# Patient Record
Sex: Female | Born: 1950
Health system: Southern US, Community
[De-identification: ages and names within clinical notes are randomized; demographics above are authoritative.]

## PROBLEM LIST (undated history)

## (undated) DIAGNOSIS — C3490 Malignant neoplasm of unspecified part of unspecified bronchus or lung: Secondary | ICD-10-CM

## (undated) DIAGNOSIS — E119 Type 2 diabetes mellitus without complications: Secondary | ICD-10-CM

## (undated) DIAGNOSIS — E785 Hyperlipidemia, unspecified: Secondary | ICD-10-CM

## (undated) DIAGNOSIS — Z460 Encounter for fitting and adjustment of spectacles and contact lenses: Secondary | ICD-10-CM

## (undated) DIAGNOSIS — R5383 Other fatigue: Secondary | ICD-10-CM

## (undated) DIAGNOSIS — M545 Low back pain, unspecified: Secondary | ICD-10-CM

## (undated) DIAGNOSIS — R5381 Other malaise: Secondary | ICD-10-CM

## (undated) DIAGNOSIS — G4730 Sleep apnea, unspecified: Secondary | ICD-10-CM

## (undated) DIAGNOSIS — I10 Essential (primary) hypertension: Secondary | ICD-10-CM

## (undated) DIAGNOSIS — Z9221 Personal history of antineoplastic chemotherapy: Secondary | ICD-10-CM

## (undated) DIAGNOSIS — N189 Chronic kidney disease, unspecified: Secondary | ICD-10-CM

## (undated) DIAGNOSIS — M1990 Unspecified osteoarthritis, unspecified site: Secondary | ICD-10-CM

## (undated) DIAGNOSIS — C801 Malignant (primary) neoplasm, unspecified: Secondary | ICD-10-CM

## (undated) DIAGNOSIS — E559 Vitamin D deficiency, unspecified: Secondary | ICD-10-CM

## (undated) DIAGNOSIS — R011 Cardiac murmur, unspecified: Secondary | ICD-10-CM

## (undated) DIAGNOSIS — J449 Chronic obstructive pulmonary disease, unspecified: Secondary | ICD-10-CM

## (undated) DIAGNOSIS — E7800 Pure hypercholesterolemia, unspecified: Secondary | ICD-10-CM

## (undated) DIAGNOSIS — Z972 Presence of dental prosthetic device (complete) (partial): Secondary | ICD-10-CM

## (undated) HISTORY — DX: Other fatigue: R53.83

## (undated) HISTORY — DX: Low back pain: M54.5

## (undated) HISTORY — DX: Malignant neoplasm of unspecified part of unspecified bronchus or lung: C34.90

## (undated) HISTORY — DX: Low back pain, unspecified: M54.50

## (undated) HISTORY — DX: Vitamin D deficiency, unspecified: E55.9

## (undated) HISTORY — DX: Chronic kidney disease, unspecified: N18.9

## (undated) HISTORY — PX: MOUTH SURGERY: SHX715

## (undated) HISTORY — DX: Chronic obstructive pulmonary disease, unspecified: J44.9

## (undated) HISTORY — DX: Cardiac murmur, unspecified: R01.1

## (undated) HISTORY — PX: TONSILLECTOMY: SUR1361

## (undated) HISTORY — PX: CATARACT EXTRACTION: SUR2

## (undated) HISTORY — PX: OTHER SURGICAL HISTORY: SHX169

## (undated) HISTORY — DX: Pure hypercholesterolemia, unspecified: E78.00

## (undated) HISTORY — DX: Other malaise: R53.81

---

## 1997-10-30 ENCOUNTER — Other Ambulatory Visit: Admission: RE | Admit: 1997-10-30 | Discharge: 1997-10-30 | Payer: Self-pay | Admitting: Obstetrics and Gynecology

## 1998-12-13 ENCOUNTER — Other Ambulatory Visit: Admission: RE | Admit: 1998-12-13 | Discharge: 1998-12-13 | Payer: Self-pay | Admitting: Obstetrics and Gynecology

## 1999-02-12 ENCOUNTER — Encounter: Payer: Self-pay | Admitting: Obstetrics and Gynecology

## 1999-02-12 ENCOUNTER — Ambulatory Visit (HOSPITAL_COMMUNITY): Admission: RE | Admit: 1999-02-12 | Discharge: 1999-02-12 | Payer: Self-pay | Admitting: Obstetrics and Gynecology

## 2000-02-17 ENCOUNTER — Other Ambulatory Visit: Admission: RE | Admit: 2000-02-17 | Discharge: 2000-02-17 | Payer: Self-pay | Admitting: Obstetrics and Gynecology

## 2001-01-15 ENCOUNTER — Ambulatory Visit (HOSPITAL_COMMUNITY): Admission: RE | Admit: 2001-01-15 | Discharge: 2001-01-15 | Payer: Self-pay | Admitting: Obstetrics and Gynecology

## 2001-01-15 ENCOUNTER — Encounter: Payer: Self-pay | Admitting: Obstetrics and Gynecology

## 2001-03-10 ENCOUNTER — Other Ambulatory Visit: Admission: RE | Admit: 2001-03-10 | Discharge: 2001-03-10 | Payer: Self-pay | Admitting: Obstetrics and Gynecology

## 2002-07-18 ENCOUNTER — Other Ambulatory Visit: Admission: RE | Admit: 2002-07-18 | Discharge: 2002-07-18 | Payer: Self-pay | Admitting: Obstetrics and Gynecology

## 2002-09-28 ENCOUNTER — Encounter: Payer: Self-pay | Admitting: Obstetrics and Gynecology

## 2002-09-28 ENCOUNTER — Ambulatory Visit (HOSPITAL_COMMUNITY): Admission: RE | Admit: 2002-09-28 | Discharge: 2002-09-28 | Payer: Self-pay | Admitting: Obstetrics and Gynecology

## 2003-05-25 ENCOUNTER — Other Ambulatory Visit: Admission: RE | Admit: 2003-05-25 | Discharge: 2003-05-25 | Payer: Self-pay | Admitting: Obstetrics and Gynecology

## 2004-04-25 ENCOUNTER — Other Ambulatory Visit: Admission: RE | Admit: 2004-04-25 | Discharge: 2004-04-25 | Payer: Self-pay | Admitting: Obstetrics and Gynecology

## 2004-05-02 ENCOUNTER — Ambulatory Visit (HOSPITAL_COMMUNITY): Admission: RE | Admit: 2004-05-02 | Discharge: 2004-05-02 | Payer: Self-pay | Admitting: Obstetrics and Gynecology

## 2006-06-01 ENCOUNTER — Encounter: Admission: RE | Admit: 2006-06-01 | Discharge: 2006-06-01 | Payer: Self-pay | Admitting: Obstetrics and Gynecology

## 2007-07-08 ENCOUNTER — Ambulatory Visit (HOSPITAL_COMMUNITY): Admission: RE | Admit: 2007-07-08 | Discharge: 2007-07-08 | Payer: Self-pay | Admitting: Obstetrics and Gynecology

## 2008-12-13 ENCOUNTER — Ambulatory Visit (HOSPITAL_COMMUNITY): Admission: RE | Admit: 2008-12-13 | Discharge: 2008-12-13 | Payer: Self-pay | Admitting: Obstetrics and Gynecology

## 2009-01-23 ENCOUNTER — Emergency Department (HOSPITAL_COMMUNITY): Admission: EM | Admit: 2009-01-23 | Discharge: 2009-01-23 | Payer: Self-pay | Admitting: Emergency Medicine

## 2009-04-30 ENCOUNTER — Encounter: Admission: RE | Admit: 2009-04-30 | Discharge: 2009-04-30 | Payer: Self-pay | Admitting: Family Medicine

## 2009-05-22 ENCOUNTER — Encounter: Admission: RE | Admit: 2009-05-22 | Discharge: 2009-05-22 | Payer: Self-pay | Admitting: Neurosurgery

## 2009-06-05 ENCOUNTER — Encounter: Admission: RE | Admit: 2009-06-05 | Discharge: 2009-06-05 | Payer: Self-pay | Admitting: Neurosurgery

## 2010-04-03 ENCOUNTER — Ambulatory Visit (HOSPITAL_COMMUNITY): Admission: RE | Admit: 2010-04-03 | Discharge: 2010-04-03 | Payer: Self-pay | Admitting: Obstetrics and Gynecology

## 2010-07-21 HISTORY — PX: MASTECTOMY: SHX3

## 2011-04-24 ENCOUNTER — Other Ambulatory Visit (HOSPITAL_COMMUNITY): Payer: Self-pay | Admitting: Obstetrics and Gynecology

## 2011-04-24 DIAGNOSIS — Z1231 Encounter for screening mammogram for malignant neoplasm of breast: Secondary | ICD-10-CM

## 2011-05-02 ENCOUNTER — Ambulatory Visit (HOSPITAL_COMMUNITY)
Admission: RE | Admit: 2011-05-02 | Discharge: 2011-05-02 | Disposition: A | Discharge status: Home / Self Care | Payer: BC Managed Care – PPO | Source: Ambulatory Visit | Attending: Obstetrics and Gynecology | Admitting: Obstetrics and Gynecology

## 2011-05-02 DIAGNOSIS — Z1231 Encounter for screening mammogram for malignant neoplasm of breast: Secondary | ICD-10-CM | POA: Insufficient documentation

## 2011-05-07 ENCOUNTER — Other Ambulatory Visit: Payer: Self-pay | Admitting: Obstetrics and Gynecology

## 2011-05-07 DIAGNOSIS — R928 Other abnormal and inconclusive findings on diagnostic imaging of breast: Secondary | ICD-10-CM

## 2011-05-08 ENCOUNTER — Other Ambulatory Visit: Payer: Self-pay | Admitting: Obstetrics and Gynecology

## 2011-05-08 DIAGNOSIS — R928 Other abnormal and inconclusive findings on diagnostic imaging of breast: Secondary | ICD-10-CM

## 2011-05-21 ENCOUNTER — Ambulatory Visit
Admission: RE | Admit: 2011-05-21 | Discharge: 2011-05-21 | Disposition: A | Discharge status: Home / Self Care | Payer: BC Managed Care – PPO | Source: Ambulatory Visit | Attending: Obstetrics and Gynecology | Admitting: Obstetrics and Gynecology

## 2011-05-21 ENCOUNTER — Other Ambulatory Visit: Payer: Self-pay | Admitting: Obstetrics and Gynecology

## 2011-05-21 DIAGNOSIS — R928 Other abnormal and inconclusive findings on diagnostic imaging of breast: Secondary | ICD-10-CM

## 2011-05-23 ENCOUNTER — Ambulatory Visit
Admission: RE | Admit: 2011-05-23 | Discharge: 2011-05-23 | Disposition: A | Discharge status: Home / Self Care | Payer: BC Managed Care – PPO | Source: Ambulatory Visit | Attending: Obstetrics and Gynecology | Admitting: Obstetrics and Gynecology

## 2011-05-23 DIAGNOSIS — R928 Other abnormal and inconclusive findings on diagnostic imaging of breast: Secondary | ICD-10-CM

## 2011-05-26 ENCOUNTER — Other Ambulatory Visit: Payer: Self-pay | Admitting: Obstetrics and Gynecology

## 2011-05-26 DIAGNOSIS — C50912 Malignant neoplasm of unspecified site of left female breast: Secondary | ICD-10-CM

## 2011-05-26 HISTORY — PX: BREAST BIOPSY: SHX20

## 2011-05-30 ENCOUNTER — Ambulatory Visit
Admission: RE | Admit: 2011-05-30 | Discharge: 2011-05-30 | Disposition: A | Discharge status: Home / Self Care | Payer: BC Managed Care – PPO | Source: Ambulatory Visit | Attending: Obstetrics and Gynecology | Admitting: Obstetrics and Gynecology

## 2011-05-30 DIAGNOSIS — C50912 Malignant neoplasm of unspecified site of left female breast: Secondary | ICD-10-CM

## 2011-05-30 MED ORDER — GADOBENATE DIMEGLUMINE 529 MG/ML IV SOLN
15.0000 mL | Freq: Once | INTRAVENOUS | Status: AC | PRN
  Administered 2011-05-30: 15 mL via INTRAVENOUS

## 2011-06-03 ENCOUNTER — Other Ambulatory Visit: Payer: Self-pay | Admitting: *Deleted

## 2011-06-03 DIAGNOSIS — C50919 Malignant neoplasm of unspecified site of unspecified female breast: Secondary | ICD-10-CM

## 2011-06-04 ENCOUNTER — Ambulatory Visit: Payer: BC Managed Care – PPO | Admitting: Radiation Oncology

## 2011-06-11 ENCOUNTER — Ambulatory Visit: Payer: BC Managed Care – PPO

## 2011-06-11 ENCOUNTER — Ambulatory Visit (HOSPITAL_BASED_OUTPATIENT_CLINIC_OR_DEPARTMENT_OTHER): Payer: BC Managed Care – PPO | Admitting: General Surgery

## 2011-06-11 ENCOUNTER — Encounter (INDEPENDENT_AMBULATORY_CARE_PROVIDER_SITE_OTHER): Payer: Self-pay | Admitting: General Surgery

## 2011-06-11 ENCOUNTER — Ambulatory Visit: Payer: BC Managed Care – PPO | Attending: General Surgery | Admitting: Physical Therapy

## 2011-06-11 ENCOUNTER — Encounter: Payer: Self-pay | Admitting: Oncology

## 2011-06-11 ENCOUNTER — Ambulatory Visit (HOSPITAL_BASED_OUTPATIENT_CLINIC_OR_DEPARTMENT_OTHER): Payer: BC Managed Care – PPO | Admitting: Oncology

## 2011-06-11 ENCOUNTER — Ambulatory Visit
Admission: RE | Admit: 2011-06-11 | Discharge: 2011-06-11 | Disposition: A | Discharge status: Home / Self Care | Payer: BC Managed Care – PPO | Source: Ambulatory Visit | Attending: Radiation Oncology | Admitting: Radiation Oncology

## 2011-06-11 ENCOUNTER — Other Ambulatory Visit (HOSPITAL_BASED_OUTPATIENT_CLINIC_OR_DEPARTMENT_OTHER): Payer: BC Managed Care – PPO | Admitting: Lab

## 2011-06-11 VITALS — BP 121/78 | HR 77 | Temp 98.3°F | Ht 59.5 in | Wt 163.6 lb

## 2011-06-11 DIAGNOSIS — C50912 Malignant neoplasm of unspecified site of left female breast: Secondary | ICD-10-CM

## 2011-06-11 DIAGNOSIS — C50212 Malignant neoplasm of upper-inner quadrant of left female breast: Secondary | ICD-10-CM | POA: Diagnosis present

## 2011-06-11 DIAGNOSIS — IMO0001 Reserved for inherently not codable concepts without codable children: Secondary | ICD-10-CM | POA: Insufficient documentation

## 2011-06-11 DIAGNOSIS — C50919 Malignant neoplasm of unspecified site of unspecified female breast: Secondary | ICD-10-CM

## 2011-06-11 DIAGNOSIS — D0590 Unspecified type of carcinoma in situ of unspecified breast: Secondary | ICD-10-CM

## 2011-06-11 DIAGNOSIS — Z170 Estrogen receptor positive status [ER+]: Secondary | ICD-10-CM

## 2011-06-11 DIAGNOSIS — C50219 Malignant neoplasm of upper-inner quadrant of unspecified female breast: Secondary | ICD-10-CM

## 2011-06-11 LAB — CBC WITH DIFFERENTIAL/PLATELET
BASO%: 0.4 % (ref 0.0–2.0)
HCT: 39.3 % (ref 34.8–46.6)
LYMPH%: 28.7 % (ref 14.0–49.7)
MCH: 33 pg (ref 25.1–34.0)
MCHC: 33.7 g/dL (ref 31.5–36.0)
MCV: 97.8 fL (ref 79.5–101.0)
MONO#: 0.5 10*3/uL (ref 0.1–0.9)
MONO%: 10.4 % (ref 0.0–14.0)
NEUT%: 58.7 % (ref 38.4–76.8)
Platelets: 197 10*3/uL (ref 145–400)
RBC: 4.02 10*6/uL (ref 3.70–5.45)

## 2011-06-11 LAB — COMPREHENSIVE METABOLIC PANEL
ALT: 17 U/L (ref 0–35)
Alkaline Phosphatase: 64 U/L (ref 39–117)
CO2: 28 mEq/L (ref 19–32)
Creatinine, Ser: 0.78 mg/dL (ref 0.50–1.10)
Total Bilirubin: 0.4 mg/dL (ref 0.3–1.2)

## 2011-06-11 NOTE — Progress Notes (Signed)
Martha Pham is an 60 y.o. female.   Chief Complaint: left breast cancer  HPI: this patient is seen today in the multi-disciplinary breast clinic for evaluation of a newly diagnosed left breast cancer found on annual screening mammogram. She was noted to have some calcifications and was called back for biopsy which was consistent with invasive ductal carcinoma. She states that she does hurt self breast exams and has not noticed any suspicious masses or changes even after the biopsy was performed. She has a sister with a history of breast cancer at age 55 but no other family history of breast cancer. She denies any systemic symptoms such as headaches, bony pains, or weight loss. On followup MRI she was noted to have a 2.7 cm x 2.3 cm x 1.0 cm area of enhancement in the area of the known tumor.  No past medical history on file. HTN, HLD   No past surgical history on file.  Family History  Problem Relation Age of Onset  . Breast cancer Sister 69   Social History:  reports that she has never smoked. She does not have any smokeless tobacco history on file. She reports that she drinks alcohol. Her drug history not on file.  Allergies:  Allergies  Allergen Reactions  . Dust Mite Extract Itching  . Other     grass  All other review of systems negative or noncontributory except as stated in the HPI   No current outpatient prescriptions on file as of 06/11/2011.   No current facility-administered medications on file as of 06/11/2011.    Results for orders placed in visit on 06/11/11 (from the past 48 hour(s))  CBC WITH DIFFERENTIAL     Status: Normal   Collection Time   06/11/11  8:39 AM      Component Value Range Comment   WBC 4.7  3.9 - 10.3 (10e3/uL)    NEUT# 2.8  1.5 - 6.5 (10e3/uL)    HGB 13.3  11.6 - 15.9 (g/dL)    HCT 16.1  09.6 - 04.5 (%)    Platelets 197  145 - 400 (10e3/uL)    MCV 97.8  79.5 - 101.0 (fL)    MCH 33.0  25.1 - 34.0 (pg)    MCHC 33.7  31.5 - 36.0 (g/dL)    RBC 4.09  8.11 - 9.14 (10e6/uL)    RDW 12.1  11.2 - 14.5 (%)    lymph# 1.4  0.9 - 3.3 (10e3/uL)    MONO# 0.5  0.1 - 0.9 (10e3/uL)    Eosinophils Absolute 0.1  0.0 - 0.5 (10e3/uL)    Basophils Absolute 0.0  0.0 - 0.1 (10e3/uL)    NEUT% 58.7  38.4 - 76.8 (%)    LYMPH% 28.7  14.0 - 49.7 (%)    MONO% 10.4  0.0 - 14.0 (%)    EOS% 1.8  0.0 - 7.0 (%)    BASO% 0.4  0.0 - 2.0 (%)   COMPREHENSIVE METABOLIC PANEL     Status: Abnormal   Collection Time   06/11/11  8:39 AM      Component Value Range Comment   Sodium 141  135 - 145 (mEq/L)    Potassium 3.5  3.5 - 5.3 (mEq/L)    Chloride 103  96 - 112 (mEq/L)    CO2 28  19 - 32 (mEq/L)    Glucose, Bld 108 (*) 70 - 99 (mg/dL)    BUN 17  6 - 23 (mg/dL)    Creatinine, Ser  0.78  0.50 - 1.10 (mg/dL)    Total Bilirubin 0.4  0.3 - 1.2 (mg/dL)    Alkaline Phosphatase 64  39 - 117 (U/L)    AST 15  0 - 37 (U/L)    ALT 17  0 - 35 (U/L)    Total Protein 7.9  6.0 - 8.3 (g/dL)    Albumin 4.1  3.5 - 5.2 (g/dL)    Calcium 16.1  8.4 - 10.5 (mg/dL)    No results found.   There were no vitals taken for this visit. General appearance: alert, cooperative and no distress Head: Normocephalic, without obvious abnormality, atraumatic Eyes: conjunctivae/corneas clear. PERRL, EOM's intact. Fundi benign. Neck: no adenopathy and supple, symmetrical, trachea midline Resp: clear to auscultation bilaterally Chest wall: no tenderness Breasts: normal appearance, no masses or tenderness, on the right breast she has a scar from her recent mole removal on the upper inner portion of her breast but otherwise no suspicious masses skin changes or lymphadenopathy. On the left she has evidence of a prior biopsy site but no suspicious masses, skin changes, or lymphadenopathy is appreciated on this side as well. Cardio: regular rate and rhythm, S1, S2 normal, no murmur, click, rub or gallop GI: soft, non-tender; bowel sounds normal; no masses,  no organomegaly Extremities:  extremities normal, atraumatic, no cyanosis or edema Pulses: 2+ and symmetric Skin: Skin color, texture, turgor normal. No rashes or lesions Neurologic: Grossly normal  Assessment/Plan Left breast cancer  Prolonged discussion with the patient regarding the options for breast conservation versus mastectomy including the pros and cons and risks and benefits of each option. Regardless of the surgical option noting that she would benefit from a sentinel node biopsy and we will plan for this at the time of her surgery. I think that she is leaning towards breast conservation and given the size of her breast, I think that she would be a reasonable candidate for this. She is also triple negative breast cancer and will likely need chemotherapy and this was offered to her today by Dr. Donnie Coffin. Since she is leaning towards breast conservation we will plan for a left needle localized lumpectomy with sentinel node biopsy and right-sided MediPort placement. She was also offered neoadjuvant treatment by Dr. Donnie Coffin and she is planning on considering this and will let us know if she would like to proceed with neoadjuvant treatment or proceed with standard breast conservation. In the meantime, will set her up for her planned procedure and I did discuss with her the risks of infection, bleeding, pain, scarring, pneumothorax and hemothorax, need for open surgery and port malfunction and she expressed understanding and desires to proceed with MediPort placement we also discussed the risks of lumpectomy and sentinel node biopsy including the need for reexcision of margins and recurrence poor cosmesis and wound infection as well as the need for radiation therapy. She understands that she may need mastectomy potential is well if we are unable to obtain clear margins with breast conservation. At lease 45 minutes was spent counseling this patient.  Lodema Pilot DAVID 06/11/2011, 12:59 PM

## 2011-06-12 DIAGNOSIS — C50212 Malignant neoplasm of upper-inner quadrant of left female breast: Secondary | ICD-10-CM | POA: Diagnosis present

## 2011-06-12 NOTE — Progress Notes (Signed)
CC:   Martha Pham, M.D. Martha Pham. Martha Pham, M.D. Martha Pham, M.D., F.R.C.P.C. Martha Pilot, MD  DIAGNOSIS:  T2 N0 invasive ductal carcinoma of the left breast.  PREVIOUS INTERVENTIONS:  Left needle core biopsy on 05/23/2011 revealing triple-negative high-grade invasive ductal carcinoma, Ki-67 of 75%.  HISTORY OF PRESENT ILLNESS:  Martha Pham is a pleasant 60 year old female who presented with new calcifications seen on a screening mammogram. These were biopsied and found revealed a grade 3 invasive ductal carcinoma.  This malignancy was triple negative with high Ki-67.  MRI confirmed the presence of a 2.7 x 2.3 x 1.0 cm mass in the left breast. No abnormalities were seen in the right breast.  No abnormal lymph nodes were seen.  She was then referred to multidisciplinary clinic for treatment options of this malignancy.  In speaking with Martha Pham, she has been asymptomatic since her biopsy.  She reports no symptoms prior to her biopsy.  PAST MEDICAL HISTORY: 1. High blood pressure. 2. Back, neck and leg pain.  MEDICATIONS:  Micardis and Lipitor.  ALLERGIES:  Dust and grass.  FAMILY HISTORY:  Her sister had breast cancer at 37.  There is no other family history of malignancy.  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  She is widowed.  She is accompanied by her daughter whom she asked, after our conversation, to come into the room today. She denies any smoking history.  She drinks about a glass of wine per week.  GYN HISTORY:  First menses 10.  Last menstrual period in 1999.  She used hormone replacement for 3 years.  She is GX, P3.  REVIEW OF SYSTEMS:  Positive for night sweats, pain, muscle aching, blurry vision, sinusitis, dentures, irregular heartbeat, coughing, heartburn, abdominal pain, breast lump, abnormal moles, back pain, joint pain, arthritis, anxiety, depression, hot flashes and occasional steroid use.  PHYSICAL  EXAMINATION:  She is a pleasant female in no distress, sitting comfortably on the exam room table.  Vital signs:  Weight 163 pounds, height 4 feet 11 inches.  Blood pressure 121/78, pulse 77, respirations 20, temperature 98.3.  She is a pleasant female who appears her stated age sitting comfortably on a chair.  She has no palpable cervical or supraclavicular adenopathy.  No palpable axillary adenopathy bilaterally.  She has large pendulous breasts bilaterally.  She has a keloid over the inner portion of her right breast.  No palpable abnormalities there.  She has a small biopsy entry point in the upper inner quadrant of the right breast.  No palpable abnormalities there.  IMPRESSION:  T2 N0 invasive ductal carcinoma of the left breast.  RECOMMENDATIONS:  I spoke to Martha Pham regarding her diagnosis and options for treatment.  We discussed the equivalency in terms of survival between mastectomy and lumpectomy with radiation.  We discussed 6 weeks of treatment as an outpatient.  We discussed the process of simulation and the placement of tattoos.  We discussed the possible side effects including but not limited to skin darkening, which can be permanent or temporary, and damage to the heart and lungs.  I think she would benefit from being treated in the prone position.  I told her I would meet back with her after chemotherapy if she elected for breast conservation.  She will also be meeting with Dr. Donnie Coffin and Dr. Biagio Quint today regarding other options.  I spent 60 minutes with this patient, greater than 50% of that time was spent in  counseling and coordination of care.    ______________________________ Lurline Hare, M.D. SW/MEDQ  D:  06/11/2011  T:  06/12/2011  Job:  [id number]

## 2011-06-15 NOTE — Progress Notes (Signed)
Martha Pham Dob:01-Nov-2050 60 yo     Referral MD  CC: Dr Trude Mcburney         Dr Luciano Cutter         Dr . Ernestina Patches         Dr. Valentino Hue    Reason for Referral: Breast cancer   Chief Complaint  Patient presents with  . Breast Cancer found on routine screening   :   HPI: This is a pleasant 60 year old woman from [facility] referred for evaluation and discussion after being  diagnosed with breast cancer. She is seen today in the multidisciplinary clinic by doctors Leonie Douglas and myself.  she has been in good health. She she does undergo annual screening mammography. Mammogram performed 05/02/2011 showed calcifications in the left breast her mammogram left breast ultrasound performed 05/21/2011 showed developing mass left breast. A biopsy performed on 05/23/2011 showed a decreased ductal cancer, ER and PR 0, proliferative index 75% and HER-2 was nonamplified .MRI scan of both breasts was performed 05/30/2011 and this showed a lesion measuring 2.7 x 2.3 x 1 cm in the upper inner quadrant of the left breast . No other abnormalities were seen  :  No past surgical history on file.:  Current outpatient prescriptions:atorvastatin (LIPITOR) 10 MG tablet, Take 10 mg by mouth daily.  , Disp: , Rfl: ;  PRESCRIPTION MEDICATION, Take by mouth daily. Mercardis , Disp: , Rfl: :    :  Allergies  Allergen Reactions  . Dust Mite Extract Itching  . Other     grass  :  Family History  Problem Relation Age of Onset  . Breast cancer Sister 28  :total of 9 sibs- 2 brothers, 7 sisters. No other history of breast or ovarian cancer  History   Social History  . Marital Status: Widowed    Spouse Name: N/A    Number of Children: 1 Latoya m [place], 32 , [place], [place]  . Years of Education: N/A   Occupational History  . Not on file.   Social History Main Topics  . Smoking status: Never Smoker   . Smokeless tobacco: Not on file  . Alcohol Use: Yes, 1 glass of wine per  week  . Drug Use:   . Sexually Active:    Other Topics Concern  . 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  : Reproductive History: G3P3; 2 children have passed away; crib death and consequences of hypertension at age 44.  Menopause 1999- HRT x 3 yrs., BCP x 9 yrs.   Exam  HEENT:  Sclerae anicteric, conjunctivae pink.  Oropharynx clear.  No mucositis or candidiasis.  Nodes:  No cervical, supraclavicular, or axillary lymphadenopathy palpated.  Breast Exam:  Right breast is benign.  No masses, discharge, skin change, or nipple inversion.  Left breast is benign.  No masses, discharge, skin change, or nipple inversion., slight ecchymoses detected. Lungs:  Clear to auscultation bilaterally.  No crackles, rhonchi, or wheezes.  Heart:  Regular rate and rhythm.  Abdomen:  Soft, nontender.  Positive bowel sounds.  No organomegaly or masses palpated.  Musculoskeletal:  No focal spinal tenderness to palpation.  Extremities:  Benign.  No peripheral edema or cyanosis.  Skin:  Benign.  Neuro:  Nonfocal.     Blood smear review: n/a  Pathology:as above  Mr Breast Bilateral W Wo Contrast  05/30/2011  *RADIOLOGY REPORT*  Clinical Data: Recent diagnosis of ductal carcinoma in situ of the left breast.  BUN and creatinine were obtained on site at [facility] at [address] Results:  BUN 9 mg/dL,  Creatinine 0.8 mg/dL.  BILATERAL BREAST MRI WITH AND WITHOUT CONTRAST  Technique: Multiplanar, multisequence MR images of both breasts were obtained prior to and following the intravenous administration of 15ml of Multihance.  Three dimensional images were evaluated at the independent DynaCad workstation.  Comparison:  Bilateral mammogram 05/02/2011 and post-biopsy clip mammogram 05/23/2011  Findings:  LEFT BREAST: There is a mild background parenchymal enhancement pattern bilaterally.  Linearly oriented stippled enhancement in the upper  inner quadrant of the left breast, middle third, measures 2.7 x 2.3 x 1.0 cm (AP by craniocaudal by transverse dimensions). Associated biopsy tract extends inferiorly from the abnormal enhancement.  Biopsy clip artifact is satisfactorily positioned in the region of suspicious enhancement. The remainder of the left breast is negative.  RIGHT BREAST: No mass or suspicious enhancement is identified in the right breast to suggest malignancy.  Negative for axillary or internal mammary chain lymphadenopathy.  IMPRESSION:  1.  Solitary area of linear stippled enhancement upper inner quadrant of the left breast corresponds to the biopsy-proven ductal carcinoma in situ.  The remainder of the left breast is negative. 2.  No MRI evidence of malignancy in the right breast.  THREE-DIMENSIONAL MR IMAGE RENDERING ON INDEPENDENT WORKSTATION:  Three-dimensional MR images were rendered by post-processing of the original MR data on an independent workstation.  The three- dimensional MR images were interpreted, and findings were reported in the accompanying complete MRI report for this study.  BI-RADS CATEGORY 6:  Known biopsy-proven malignancy - appropriate action should be taken.  Original Report Authenticated By: Britta Mccreedy, M.D.   Mm Breast Stereo Biopsy Left  05/26/2011  **ADDENDUM** CREATED: 05/26/2011 12:34:57  The pathology associated with the left breast stereotactic core biopsy demonstrated invasive ductal carcinoma and DCIS which is felt to be high grade.  The pathology is concordant with imaging findings.  Dr. Jean Rosenthal discussed the findings with the patient by telephone and answered her questions.  The patient states her biopsy site is clean and dry without hematoma formation.  Post biopsy wound care instructions were reviewed with the patient.  The patient has been scheduled for the multidisciplinary clinic on 06/04/2011.  Breast MRI has been scheduled for 05/30/2011 at 09:15 a.m.  The patient was encouraged to call  the Breast Center for additional questions or concerns.  Addended by:  Rolla Plate, M.D. on 05/26/2011 12:34:57.  **END ADDENDUM** SIGNED BY: Rolla Plate, M.D.   05/26/2011  *RADIOLOGY REPORT*  Clinical Data:  Patient presents for stereotactic biopsy of a group of indeterminate microcalcifications with possible associated mass of the upper inner quadrant of the left breast.  STEREOTACTIC-GUIDED VACUUM ASSISTED BIOPSY OF THE LEFT BREAST AND SPECIMEN RADIOGRAPH  I met with the patient, and we discussed the procedure of stereotactic-guided biopsy, including risks, benefits, and alternatives.  Specifically, we discussed the risks of infection, bleeding, tissue injury, clip migration, and inadequate sampling. Informed, written consent was given.  Using sterile technique, 2% lidocaine, stereotactic guidance, and a 9 gauge vacuum assisted device, biopsy was performed of the targeted group of microcalcifications and possible associated mass. Specimen radiograph was performed, showing multiple specimens containing the targeted microcalcifications.  Specimens with calcifications are identified for pathology.  At the conclusion of the procedure, a T-shaped tissue marker clip was deployed into the biopsy cavity.  Follow-up 2-view mammogram confirmed clip placement satisfactorily at the biopsy site.  IMPRESSION: Stereotactic-guided biopsy of indeterminate left breast microcalcifications.  No apparent complications.  Original Report Authenticated By: Rolla Plate, M.D.   Mm Breast Surgical Specimen  05/26/2011  **ADDENDUM** CREATED: 05/26/2011 12:34:57  The pathology associated with the left breast stereotactic core biopsy demonstrated invasive ductal carcinoma and DCIS which is felt to be high grade.  The pathology is concordant with imaging findings.  Dr. Jean Rosenthal discussed the findings with the patient by telephone and answered her questions.  The patient states her biopsy site is clean and dry without  hematoma formation.  Post biopsy wound care instructions were reviewed with the patient.  The patient has been scheduled for the multidisciplinary clinic on 06/04/2011.  Breast MRI has been scheduled for 05/30/2011 at 09:15 a.m.  The patient was encouraged to call the Breast Center for additional questions or concerns.  Addended by:  Rolla Plate, M.D. on 05/26/2011 12:34:57.  **END ADDENDUM** SIGNED BY: Rolla Plate, M.D.   05/26/2011  *RADIOLOGY REPORT*  Clinical Data:  Patient presents for stereotactic biopsy of a group of indeterminate microcalcifications with possible associated mass of the upper inner quadrant of the left breast.  STEREOTACTIC-GUIDED VACUUM ASSISTED BIOPSY OF THE LEFT BREAST AND SPECIMEN RADIOGRAPH  I met with the patient, and we discussed the procedure of stereotactic-guided biopsy, including risks, benefits, and alternatives.  Specifically, we discussed the risks of infection, bleeding, tissue injury, clip migration, and inadequate sampling. Informed, written consent was given.  Using sterile technique, 2% lidocaine, stereotactic guidance, and a 9 gauge vacuum assisted device, biopsy was performed of the targeted group of microcalcifications and possible associated mass. Specimen radiograph was performed, showing multiple specimens containing the targeted microcalcifications.  Specimens with calcifications are identified for pathology.  At the conclusion of the procedure, a T-shaped tissue marker clip was deployed into the biopsy cavity.  Follow-up 2-view mammogram confirmed clip placement satisfactorily at the biopsy site.  IMPRESSION: Stereotactic-guided biopsy of indeterminate left breast microcalcifications.  No apparent complications.  Original Report Authenticated By: Rolla Plate, M.D.   Mm Digital Diag Ltd L  05/21/2011  *RADIOLOGY REPORT*  Clinical Data:  Abnormal screening, left breast  DIGITAL DIAGNOSTIC LEFT MAMMOGRAM WITHOUT CAD  Comparison:  Multiple priors   Findings:  Spot magnification views in the upper inner quadrant of the left breast, middle third confirm the presence of a cluster of calcifications and an associated low density irregular mass with ill defined margins.  The calcifications are coarse and linear.  No linear orientation is noted.  This may represent fat necrosis although malignancy cannot be excluded.   Therefore, a stereotactic biopsy is recommended.  IMPRESSION: Calcifications and developing mass, left breast which a stereotactic biopsy is scheduled at the patient's request for Friday, November 2 at 8 am.  BI-RADS CATEGORY 4:  Suspicious abnormality - biopsy should be considered.  Original Report Authenticated By: Hiram Gash, M.D.    Assessment and Plan: This is a pleasant 60 year old woman here with her family to discuss her recent diagnosis of breast cancer. She has a triple negative breast cancer that would lend itself well to get a down staging type chemotherapy . I have given her the option of treatment up front with chemotherapy knowing that after surgery she would likely require chemotherapy and adjuvant fashion. The 21st of be to help down stage her cancer as well as 2 of get a sense of the intrinsic sensitivity of the tumor.  I proposed giving her a total of 8 cycles of chemotherapy every 2 weeks via a Port-A-Cath. She will  require staging scans, a 2-D echo, chemotherapy teaching  And port placement. I spent approximately 70 minutes with the patient, at that time was spent in patient-related counseling.   I will plan to see her after she has made a decision about chemotherapy and has had her scans .  Pierce Crane MD, FRCPC

## 2011-06-17 ENCOUNTER — Other Ambulatory Visit: Payer: Self-pay | Admitting: Oncology

## 2011-06-17 DIAGNOSIS — C50219 Malignant neoplasm of upper-inner quadrant of unspecified female breast: Secondary | ICD-10-CM

## 2011-06-18 ENCOUNTER — Other Ambulatory Visit (INDEPENDENT_AMBULATORY_CARE_PROVIDER_SITE_OTHER): Payer: Self-pay | Admitting: General Surgery

## 2011-06-19 ENCOUNTER — Telehealth: Payer: Self-pay | Admitting: Oncology

## 2011-06-19 ENCOUNTER — Other Ambulatory Visit (INDEPENDENT_AMBULATORY_CARE_PROVIDER_SITE_OTHER): Payer: Self-pay | Admitting: General Surgery

## 2011-06-19 DIAGNOSIS — C50912 Malignant neoplasm of unspecified site of left female breast: Secondary | ICD-10-CM

## 2011-06-19 NOTE — Telephone Encounter (Signed)
called and informed her of muga scan on 12/03 @ 11am @ [facility] and ct and bone scan on 07/04/2011 @ 12noon @ [facility].  there were no f/u appts at this time

## 2011-06-20 ENCOUNTER — Other Ambulatory Visit (INDEPENDENT_AMBULATORY_CARE_PROVIDER_SITE_OTHER): Payer: Self-pay | Admitting: General Surgery

## 2011-06-20 ENCOUNTER — Other Ambulatory Visit: Payer: Self-pay | Admitting: Interventional Cardiology

## 2011-06-20 DIAGNOSIS — C50919 Malignant neoplasm of unspecified site of unspecified female breast: Secondary | ICD-10-CM

## 2011-06-20 DIAGNOSIS — C50912 Malignant neoplasm of unspecified site of left female breast: Secondary | ICD-10-CM

## 2011-06-23 ENCOUNTER — Encounter (HOSPITAL_COMMUNITY)
Admission: RE | Admit: 2011-06-23 | Discharge: 2011-06-23 | Disposition: A | Discharge status: Home / Self Care | Payer: BC Managed Care – PPO | Source: Ambulatory Visit | Attending: Oncology | Admitting: Oncology

## 2011-06-23 ENCOUNTER — Telehealth: Payer: Self-pay | Admitting: *Deleted

## 2011-06-23 ENCOUNTER — Other Ambulatory Visit: Payer: Self-pay

## 2011-06-23 ENCOUNTER — Encounter: Payer: Self-pay | Admitting: *Deleted

## 2011-06-23 ENCOUNTER — Encounter (HOSPITAL_COMMUNITY): Payer: Self-pay

## 2011-06-23 ENCOUNTER — Encounter (HOSPITAL_BASED_OUTPATIENT_CLINIC_OR_DEPARTMENT_OTHER): Payer: Self-pay | Admitting: *Deleted

## 2011-06-23 ENCOUNTER — Other Ambulatory Visit (INDEPENDENT_AMBULATORY_CARE_PROVIDER_SITE_OTHER): Payer: Self-pay | Admitting: General Surgery

## 2011-06-23 ENCOUNTER — Ambulatory Visit
Admission: RE | Admit: 2011-06-23 | Discharge: 2011-06-23 | Disposition: A | Discharge status: Home / Self Care | Payer: BC Managed Care – PPO | Source: Ambulatory Visit | Attending: General Surgery | Admitting: General Surgery

## 2011-06-23 ENCOUNTER — Encounter (HOSPITAL_BASED_OUTPATIENT_CLINIC_OR_DEPARTMENT_OTHER)
Admission: RE | Admit: 2011-06-23 | Discharge: 2011-06-23 | Disposition: A | Discharge status: Home / Self Care | Payer: BC Managed Care – PPO | Source: Ambulatory Visit | Attending: General Surgery | Admitting: General Surgery

## 2011-06-23 DIAGNOSIS — C50919 Malignant neoplasm of unspecified site of unspecified female breast: Secondary | ICD-10-CM | POA: Insufficient documentation

## 2011-06-23 DIAGNOSIS — C50219 Malignant neoplasm of upper-inner quadrant of unspecified female breast: Secondary | ICD-10-CM

## 2011-06-23 DIAGNOSIS — Z01811 Encounter for preprocedural respiratory examination: Secondary | ICD-10-CM

## 2011-06-23 HISTORY — DX: Malignant (primary) neoplasm, unspecified: C80.1

## 2011-06-23 LAB — CBC
Hemoglobin: 12.5 g/dL (ref 12.0–15.0)
MCH: 32.4 pg (ref 26.0–34.0)
MCV: 96.6 fL (ref 78.0–100.0)
RBC: 3.86 MIL/uL — ABNORMAL LOW (ref 3.87–5.11)

## 2011-06-23 LAB — COMPREHENSIVE METABOLIC PANEL
ALT: 16 U/L (ref 0–35)
CO2: 27 mEq/L (ref 19–32)
Calcium: 9.8 mg/dL (ref 8.4–10.5)
GFR calc Af Amer: >90 mL/min (ref >90)
GFR calc non Af Amer: >90 mL/min (ref >90)
Glucose, Bld: 108 mg/dL — ABNORMAL HIGH (ref 70–99)
Sodium: 141 mEq/L (ref 135–145)

## 2011-06-23 LAB — DIFFERENTIAL
Basophils Absolute: 0 10*3/uL (ref 0.0–0.1)
Basophils Relative: 0 % (ref 0–1)
Eosinophils Absolute: 0.1 10*3/uL (ref 0.0–0.7)
Eosinophils Relative: 2 % (ref 0–5)
Lymphocytes Relative: 34 % (ref 12–46)

## 2011-06-23 MED ORDER — SODIUM PERTECHNETATE TC 99M INJECTION
23.1000 | Freq: Once | INTRAVENOUS | Status: AC | PRN
  Administered 2011-06-23: 23.1 via INTRAVENOUS

## 2011-06-23 NOTE — Telephone Encounter (Signed)
left voice message to inform the patient of the new date and time on 07-07-2011 at 2:00pm

## 2011-06-23 NOTE — Progress Notes (Signed)
To come in for cxr,ekg,labs

## 2011-06-23 NOTE — Telephone Encounter (Signed)
gave patient appointment for chemo class on 07-01-2011 at 9:30am

## 2011-06-24 ENCOUNTER — Ambulatory Visit (HOSPITAL_COMMUNITY): Payer: BC Managed Care – PPO

## 2011-06-24 ENCOUNTER — Encounter (HOSPITAL_BASED_OUTPATIENT_CLINIC_OR_DEPARTMENT_OTHER): Payer: Self-pay | Admitting: *Deleted

## 2011-06-24 ENCOUNTER — Ambulatory Visit
Admission: RE | Admit: 2011-06-24 | Discharge: 2011-06-24 | Disposition: A | Discharge status: Home / Self Care | Payer: BC Managed Care – PPO | Source: Ambulatory Visit | Attending: General Surgery | Admitting: General Surgery

## 2011-06-24 ENCOUNTER — Ambulatory Visit: Admit: 2011-06-24 | Payer: Self-pay | Admitting: General Surgery

## 2011-06-24 ENCOUNTER — Ambulatory Visit (HOSPITAL_COMMUNITY): Admission: RE | Admit: 2011-06-24 | Payer: BC Managed Care – PPO | Source: Ambulatory Visit

## 2011-06-24 ENCOUNTER — Ambulatory Visit (HOSPITAL_COMMUNITY)
Admission: RE | Admit: 2011-06-24 | Discharge: 2011-06-24 | Disposition: A | Discharge status: Home / Self Care | Payer: BC Managed Care – PPO | Source: Ambulatory Visit | Attending: General Surgery | Admitting: General Surgery

## 2011-06-24 ENCOUNTER — Other Ambulatory Visit (INDEPENDENT_AMBULATORY_CARE_PROVIDER_SITE_OTHER): Payer: Self-pay | Admitting: General Surgery

## 2011-06-24 ENCOUNTER — Encounter (HOSPITAL_BASED_OUTPATIENT_CLINIC_OR_DEPARTMENT_OTHER)
Admission: RE | Disposition: A | Discharge status: Home / Self Care | Payer: Self-pay | Source: Ambulatory Visit | Attending: General Surgery

## 2011-06-24 ENCOUNTER — Ambulatory Visit (HOSPITAL_BASED_OUTPATIENT_CLINIC_OR_DEPARTMENT_OTHER): Payer: BC Managed Care – PPO | Admitting: *Deleted

## 2011-06-24 ENCOUNTER — Ambulatory Visit (HOSPITAL_BASED_OUTPATIENT_CLINIC_OR_DEPARTMENT_OTHER)
Admission: RE | Admit: 2011-06-24 | Discharge: 2011-06-24 | Disposition: A | Discharge status: Home / Self Care | Payer: BC Managed Care – PPO | Source: Ambulatory Visit | Attending: General Surgery | Admitting: General Surgery

## 2011-06-24 ENCOUNTER — Other Ambulatory Visit (HOSPITAL_COMMUNITY): Payer: BC Managed Care – PPO

## 2011-06-24 DIAGNOSIS — C50219 Malignant neoplasm of upper-inner quadrant of unspecified female breast: Secondary | ICD-10-CM

## 2011-06-24 DIAGNOSIS — C50912 Malignant neoplasm of unspecified site of left female breast: Secondary | ICD-10-CM

## 2011-06-24 DIAGNOSIS — Z01810 Encounter for preprocedural cardiovascular examination: Secondary | ICD-10-CM | POA: Insufficient documentation

## 2011-06-24 DIAGNOSIS — Z01812 Encounter for preprocedural laboratory examination: Secondary | ICD-10-CM | POA: Insufficient documentation

## 2011-06-24 DIAGNOSIS — C50919 Malignant neoplasm of unspecified site of unspecified female breast: Secondary | ICD-10-CM | POA: Insufficient documentation

## 2011-06-24 DIAGNOSIS — I10 Essential (primary) hypertension: Secondary | ICD-10-CM | POA: Insufficient documentation

## 2011-06-24 HISTORY — PX: BREAST LUMPECTOMY W/ NEEDLE LOCALIZATION: SHX1266

## 2011-06-24 HISTORY — PX: PORTACATH PLACEMENT: SHX2246

## 2011-06-24 HISTORY — DX: Hyperlipidemia, unspecified: E78.5

## 2011-06-24 HISTORY — DX: Essential (primary) hypertension: I10

## 2011-06-24 SURGERY — BREAST LUMPECTOMY WITH SENTINEL LYMPH NODE BX
Anesthesia: General | Laterality: Right

## 2011-06-24 SURGERY — BREAST LUMPECTOMY WITH EXCISION OF SENTINEL NODE
Anesthesia: General | Laterality: Right

## 2011-06-24 MED ORDER — FENTANYL CITRATE 0.05 MG/ML IJ SOLN
INTRAMUSCULAR | Status: DC | PRN
  Administered 2011-06-24: 25 ug via INTRAVENOUS
  Administered 2011-06-24 (×2): 50 ug via INTRAVENOUS
  Administered 2011-06-24 (×2): 25 ug via INTRAVENOUS

## 2011-06-24 MED ORDER — METHYLENE BLUE 1 % INJ SOLN
INTRAMUSCULAR | Status: DC | PRN
  Administered 2011-06-24: 2 mL via SUBMUCOSAL

## 2011-06-24 MED ORDER — FENTANYL CITRATE 0.05 MG/ML IJ SOLN
25.0000 ug | INTRAMUSCULAR | Status: DC | PRN
  Administered 2011-06-24 (×2): 25 ug via INTRAVENOUS
  Administered 2011-06-24: 50 ug via INTRAVENOUS

## 2011-06-24 MED ORDER — SCOPOLAMINE 1 MG/3DAYS TD PT72
1.0000 | MEDICATED_PATCH | Freq: Once | TRANSDERMAL | Status: DC
  Administered 2011-06-24: 1.5 mg via TRANSDERMAL

## 2011-06-24 MED ORDER — LIDOCAINE HCL (CARDIAC) 20 MG/ML IV SOLN
INTRAVENOUS | Status: DC | PRN
  Administered 2011-06-24: 80 mg via INTRAVENOUS

## 2011-06-24 MED ORDER — MIDAZOLAM HCL 5 MG/5ML IJ SOLN
INTRAMUSCULAR | Status: DC | PRN
  Administered 2011-06-24: 2 mg via INTRAVENOUS

## 2011-06-24 MED ORDER — ONDANSETRON HCL 4 MG/2ML IJ SOLN
INTRAMUSCULAR | Status: DC | PRN
  Administered 2011-06-24: 4 mg via INTRAVENOUS

## 2011-06-24 MED ORDER — TECHNETIUM TC 99M SULFUR COLLOID FILTERED
1.0000 | Freq: Once | INTRAVENOUS | Status: AC | PRN
  Administered 2011-06-24: 1 via INTRADERMAL

## 2011-06-24 MED ORDER — GLYCOPYRROLATE 0.2 MG/ML IJ SOLN
INTRAMUSCULAR | Status: DC | PRN
  Administered 2011-06-24: 0.2 mg via INTRAVENOUS

## 2011-06-24 MED ORDER — SODIUM CHLORIDE 0.9 % IJ SOLN
INTRAMUSCULAR | Status: DC | PRN
  Administered 2011-06-24: 3 mL via INTRAVENOUS

## 2011-06-24 MED ORDER — LIDOCAINE-EPINEPHRINE (PF) 1 %-1:200000 IJ SOLN
INTRAMUSCULAR | Status: DC | PRN
  Administered 2011-06-24: 20 mL
  Administered 2011-06-24: 3 mL

## 2011-06-24 MED ORDER — CEFAZOLIN SODIUM-DEXTROSE 2-3 GM-% IV SOLR: 2.0000 g | INTRAVENOUS | Status: DC

## 2011-06-24 MED ORDER — FENTANYL CITRATE 0.05 MG/ML IJ SOLN
50.0000 ug | INTRAMUSCULAR | Status: DC | PRN
  Administered 2011-06-24: 50 ug via INTRAVENOUS

## 2011-06-24 MED ORDER — HYDROMORPHONE HCL PF 1 MG/ML IJ SOLN
0.2500 mg | INTRAMUSCULAR | Status: DC | PRN
  Administered 2011-06-24: 0.5 mg via INTRAVENOUS

## 2011-06-24 MED ORDER — MIDAZOLAM HCL 2 MG/2ML IJ SOLN
1.0000 mg | INTRAMUSCULAR | Status: DC | PRN
  Administered 2011-06-24: 1 mg via INTRAVENOUS

## 2011-06-24 MED ORDER — PROPOFOL 10 MG/ML IV EMUL
INTRAVENOUS | Status: DC | PRN
  Administered 2011-06-24: 200 mg via INTRAVENOUS

## 2011-06-24 MED ORDER — PHENYLEPHRINE HCL 10 MG/ML IJ SOLN
10.0000 mg | INTRAVENOUS | Status: DC | PRN
  Administered 2011-06-24: 50 ug/min via INTRAVENOUS

## 2011-06-24 MED ORDER — DEXAMETHASONE SODIUM PHOSPHATE 4 MG/ML IJ SOLN
INTRAMUSCULAR | Status: DC | PRN
Start: 2011-06-24 — End: 2011-06-24
  Administered 2011-06-24: 10 mg via INTRAVENOUS

## 2011-06-24 MED ORDER — BUPIVACAINE HCL 0.25 % IJ SOLN
INTRAMUSCULAR | Status: DC | PRN
  Administered 2011-06-24: 20 mL
  Administered 2011-06-24: 3 mL

## 2011-06-24 MED ORDER — LACTATED RINGERS IV SOLN
INTRAVENOUS | Status: DC
  Administered 2011-06-24 (×2): via INTRAVENOUS

## 2011-06-24 MED ORDER — EPHEDRINE SULFATE 50 MG/ML IJ SOLN
INTRAMUSCULAR | Status: DC | PRN
  Administered 2011-06-24: 15 mg via INTRAVENOUS
  Administered 2011-06-24: 10 mg via INTRAVENOUS

## 2011-06-24 MED ORDER — CEFAZOLIN SODIUM-DEXTROSE 2-3 GM-% IV SOLR
2.0000 g | Freq: Once | INTRAVENOUS | Status: AC
  Administered 2011-06-24: 2 g via INTRAVENOUS

## 2011-06-24 MED ORDER — HYDROCODONE-ACETAMINOPHEN 5-500 MG PO TABS: 1.0000 | ORAL_TABLET | ORAL | Status: DC | PRN

## 2011-06-24 MED ORDER — PROMETHAZINE HCL 25 MG/ML IJ SOLN: 6.2500 mg | INTRAMUSCULAR | Status: DC | PRN

## 2011-06-24 SURGICAL SUPPLY — 76 items
ADH SKN CLS APL DERMABOND .7 (GAUZE/BANDAGES/DRESSINGS) ×2
APL SKNCLS STERI-STRIP NONHPOA (GAUZE/BANDAGES/DRESSINGS) ×2
APPLIER CLIP 9.375 MED OPEN (MISCELLANEOUS) ×3
APR CLP MED 9.3 20 MLT OPN (MISCELLANEOUS) ×2
BAG DECANTER FOR FLEXI CONT (MISCELLANEOUS) ×3 IMPLANT
BENZOIN TINCTURE PRP APPL 2/3 (GAUZE/BANDAGES/DRESSINGS) ×1 IMPLANT
BINDER BREAST LRG (GAUZE/BANDAGES/DRESSINGS) IMPLANT
BINDER BREAST MEDIUM (GAUZE/BANDAGES/DRESSINGS) IMPLANT
BINDER BREAST XLRG (GAUZE/BANDAGES/DRESSINGS) ×1 IMPLANT
BINDER BREAST XXLRG (GAUZE/BANDAGES/DRESSINGS) IMPLANT
BLADE SURG 15 STRL LF DISP TIS (BLADE) ×2 IMPLANT
BLADE SURG 15 STRL SS (BLADE) ×3
BNDG COHESIVE 4X5 TAN STRL (GAUZE/BANDAGES/DRESSINGS) ×1 IMPLANT
CANISTER SUCTION 1200CC (MISCELLANEOUS) ×3 IMPLANT
CHLORAPREP W/TINT 26ML (MISCELLANEOUS) ×3 IMPLANT
CLIP APPLIE 9.375 MED OPEN (MISCELLANEOUS) IMPLANT
CLIP TI WIDE RED SMALL 6 (CLIP) ×2 IMPLANT
COVER KIT LFREE 14X147CM (MISCELLANEOUS) ×1 IMPLANT
COVER MAYO STAND STRL (DRAPES) ×4 IMPLANT
COVER PROBE W GEL 5X96 (DRAPES) ×3 IMPLANT
COVER TABLE BACK 60X90 (DRAPES) ×3 IMPLANT
DECANTER SPIKE VIAL GLASS SM (MISCELLANEOUS) IMPLANT
DERMABOND ADVANCED (GAUZE/BANDAGES/DRESSINGS) ×1
DERMABOND ADVANCED .7 DNX12 (GAUZE/BANDAGES/DRESSINGS) ×2 IMPLANT
DRAIN PENROSE 1/2X12 LTX STRL (WOUND CARE) IMPLANT
DRAPE C-ARM 42X72 X-RAY (DRAPES) ×3 IMPLANT
DRAPE LAPAROTOMY TRNSV 102X78 (DRAPE) ×1 IMPLANT
DRAPE PED LAPAROTOMY (DRAPES) ×3 IMPLANT
DRAPE UTILITY XL STRL (DRAPES) ×3 IMPLANT
ELECT COATED BLADE 2.86 ST (ELECTRODE) ×3 IMPLANT
ELECT REM PT RETURN 9FT ADLT (ELECTROSURGICAL) ×3
ELECTRODE REM PT RTRN 9FT ADLT (ELECTROSURGICAL) ×2 IMPLANT
GAUZE SPONGE 4X4 12PLY STRL LF (GAUZE/BANDAGES/DRESSINGS) ×3 IMPLANT
GLOVE BIO SURGEON STRL SZ 6 (GLOVE) ×2 IMPLANT
GLOVE BIO SURGEON STRL SZ7.5 (GLOVE) ×10 IMPLANT
GLOVE BIOGEL PI IND STRL 6.5 (GLOVE) IMPLANT
GLOVE BIOGEL PI IND STRL 8 (GLOVE) ×2 IMPLANT
GLOVE BIOGEL PI INDICATOR 6.5 (GLOVE) ×2
GLOVE BIOGEL PI INDICATOR 8 (GLOVE) ×2
GLOVE INDICATOR 8.0 STRL GRN (GLOVE) ×3 IMPLANT
GLOVE SKINSENSE NS SZ7.0 (GLOVE) ×1
GLOVE SKINSENSE STRL SZ7.0 (GLOVE) IMPLANT
GOWN PREVENTION PLUS XLARGE (GOWN DISPOSABLE) ×5 IMPLANT
GOWN PREVENTION PLUS XXLARGE (GOWN DISPOSABLE) ×4 IMPLANT
IV HEPARIN 1000UNITS/500ML (IV SOLUTION) ×3 IMPLANT
IV KIT MINILOC 20X1 SAFETY (NEEDLE) IMPLANT
KIT POWER CATH 8FR (Catheter) ×1 IMPLANT
NDL HYPO 25X1 1.5 SAFETY (NEEDLE) ×2 IMPLANT
NDL SAFETY ECLIPSE 18X1.5 (NEEDLE) IMPLANT
NEEDLE HYPO 18GX1.5 SHARP (NEEDLE) ×3
NEEDLE HYPO 22GX1.5 SAFETY (NEEDLE) ×3 IMPLANT
NEEDLE HYPO 25X1 1.5 SAFETY (NEEDLE) ×3 IMPLANT
NS IRRIG 1000ML POUR BTL (IV SOLUTION) ×4 IMPLANT
PACK BASIN DAY SURGERY FS (CUSTOM PROCEDURE TRAY) ×3 IMPLANT
PENCIL BUTTON HOLSTER BLD 10FT (ELECTRODE) ×3 IMPLANT
SET SHEATH INTRODUCER 10FR (MISCELLANEOUS) IMPLANT
SHEATH COOK PEEL AWAY SET 9F (SHEATH) IMPLANT
SLEEVE SCD COMPRESS KNEE MED (MISCELLANEOUS) ×3 IMPLANT
SPONGE LAP 4X18 X RAY DECT (DISPOSABLE) ×3 IMPLANT
STOCKINETTE IMPERVIOUS LG (DRAPES) ×1 IMPLANT
STRIP CLOSURE SKIN 1/2X4 (GAUZE/BANDAGES/DRESSINGS) ×1 IMPLANT
SUT MNCRL AB 4-0 PS2 18 (SUTURE) ×1 IMPLANT
SUT MON AB 4-0 PC3 18 (SUTURE) ×3 IMPLANT
SUT PROLENE 2 0 SH DA (SUTURE) ×3 IMPLANT
SUT SILK 2 0 SH (SUTURE) ×3 IMPLANT
SUT VICRYL 3-0 CR8 SH (SUTURE) ×4 IMPLANT
SYR 5ML LUER SLIP (SYRINGE) ×3 IMPLANT
SYR BULB 3OZ (MISCELLANEOUS) ×1 IMPLANT
SYR CONTROL 10ML LL (SYRINGE) ×3 IMPLANT
TAPE HYPAFIX 4 X10 (GAUZE/BANDAGES/DRESSINGS) ×1 IMPLANT
TAPE STRIPS DRAPE STRL (GAUZE/BANDAGES/DRESSINGS) ×5 IMPLANT
TOWEL OR 17X24 6PK STRL BLUE (TOWEL DISPOSABLE) ×6 IMPLANT
TOWEL OR NON WOVEN STRL DISP B (DISPOSABLE) ×3 IMPLANT
TUBE CONNECTING 20X1/4 (TUBING) ×3 IMPLANT
WATER STERILE IRR 1000ML POUR (IV SOLUTION) ×2 IMPLANT
YANKAUER SUCT BULB TIP NO VENT (SUCTIONS) ×3 IMPLANT

## 2011-06-24 NOTE — Transfer of Care (Signed)
Immediate Anesthesia Transfer of Care Note  Patient: Martha Pham  Procedure(s) Performed:  BREAST LUMPECTOMY WITH SENTINEL LYMPH NODE BX - Left needle localization lumpectomy w/ sentinel node biospy; INSERTION PORT-A-CATH - Right mediport placement  Patient Location: PACU  Anesthesia Type: General  Level of Consciousness: awake and alert   Airway & Oxygen Therapy: Patient Spontanous Breathing and Patient connected to face mask oxygen  Post-op Assessment: Report given to PACU RN and Post -op Vital signs reviewed and stable  Post vital signs: Reviewed and stable  Complications: No apparent anesthesia complications

## 2011-06-24 NOTE — H&P (View-Only) (Signed)
Martha Pham is an 60 y.o. female.   Chief Complaint: left breast cancer  HPI: this patient is seen today in the multi-disciplinary breast clinic for evaluation of a newly diagnosed left breast cancer found on annual screening mammogram. She was noted to have some calcifications and was called back for biopsy which was consistent with invasive ductal carcinoma. She states that she does hurt self breast exams and has not noticed any suspicious masses or changes even after the biopsy was performed. She has a sister with a history of breast cancer at age 65 but no other family history of breast cancer. She denies any systemic symptoms such as headaches, bony pains, or weight loss. On followup MRI she was noted to have a 2.7 cm x 2.3 cm x 1.0 cm area of enhancement in the area of the known tumor.  No past medical history on file. HTN, HLD   No past surgical history on file.  Family History  Problem Relation Age of Onset  . Breast cancer Sister 65   Social History:  reports that she has never smoked. She does not have any smokeless tobacco history on file. She reports that she drinks alcohol. Her drug history not on file.  Allergies:  Allergies  Allergen Reactions  . Dust Mite Extract Itching  . Other     grass  All other review of systems negative or noncontributory except as stated in the HPI   No current outpatient prescriptions on file as of 06/11/2011.   No current facility-administered medications on file as of 06/11/2011.    Results for orders placed in visit on 06/11/11 (from the past 48 hour(s))  CBC WITH DIFFERENTIAL     Status: Normal   Collection Time   06/11/11  8:39 AM      Component Value Range Comment   WBC 4.7  3.9 - 10.3 (10e3/uL)    NEUT# 2.8  1.5 - 6.5 (10e3/uL)    HGB 13.3  11.6 - 15.9 (g/dL)    HCT 39.3  34.8 - 46.6 (%)    Platelets 197  145 - 400 (10e3/uL)    MCV 97.8  79.5 - 101.0 (fL)    MCH 33.0  25.1 - 34.0 (pg)    MCHC 33.7  31.5 - 36.0 (g/dL)    RBC 4.02  3.70 - 5.45 (10e6/uL)    RDW 12.1  11.2 - 14.5 (%)    lymph# 1.4  0.9 - 3.3 (10e3/uL)    MONO# 0.5  0.1 - 0.9 (10e3/uL)    Eosinophils Absolute 0.1  0.0 - 0.5 (10e3/uL)    Basophils Absolute 0.0  0.0 - 0.1 (10e3/uL)    NEUT% 58.7  38.4 - 76.8 (%)    LYMPH% 28.7  14.0 - 49.7 (%)    MONO% 10.4  0.0 - 14.0 (%)    EOS% 1.8  0.0 - 7.0 (%)    BASO% 0.4  0.0 - 2.0 (%)   COMPREHENSIVE METABOLIC PANEL     Status: Abnormal   Collection Time   06/11/11  8:39 AM      Component Value Range Comment   Sodium 141  135 - 145 (mEq/L)    Potassium 3.5  3.5 - 5.3 (mEq/L)    Chloride 103  96 - 112 (mEq/L)    CO2 28  19 - 32 (mEq/L)    Glucose, Bld 108 (*) 70 - 99 (mg/dL)    BUN 17  6 - 23 (mg/dL)    Creatinine, Ser   0.78  0.50 - 1.10 (mg/dL)    Total Bilirubin 0.4  0.3 - 1.2 (mg/dL)    Alkaline Phosphatase 64  39 - 117 (U/L)    AST 15  0 - 37 (U/L)    ALT 17  0 - 35 (U/L)    Total Protein 7.9  6.0 - 8.3 (g/dL)    Albumin 4.1  3.5 - 5.2 (g/dL)    Calcium 10.1  8.4 - 10.5 (mg/dL)    No results found.   There were no vitals taken for this visit. General appearance: alert, cooperative and no distress Head: Normocephalic, without obvious abnormality, atraumatic Eyes: conjunctivae/corneas clear. PERRL, EOM's intact. Fundi benign. Neck: no adenopathy and supple, symmetrical, trachea midline Resp: clear to auscultation bilaterally Chest wall: no tenderness Breasts: normal appearance, no masses or tenderness, on the right breast she has a scar from her recent mole removal on the upper inner portion of her breast but otherwise no suspicious masses skin changes or lymphadenopathy. On the left she has evidence of a prior biopsy site but no suspicious masses, skin changes, or lymphadenopathy is appreciated on this side as well. Cardio: regular rate and rhythm, S1, S2 normal, no murmur, click, rub or gallop GI: soft, non-tender; bowel sounds normal; no masses,  no organomegaly Extremities:  extremities normal, atraumatic, no cyanosis or edema Pulses: 2+ and symmetric Skin: Skin color, texture, turgor normal. No rashes or lesions Neurologic: Grossly normal  Assessment/Plan Left breast cancer  Prolonged discussion with the patient regarding the options for breast conservation versus mastectomy including the pros and cons and risks and benefits of each option. Regardless of the surgical option noting that she would benefit from a sentinel node biopsy and we will plan for this at the time of her surgery. I think that she is leaning towards breast conservation and given the size of her breast, I think that she would be a reasonable candidate for this. She is also triple negative breast cancer and will likely need chemotherapy and this was offered to her today by Dr. Rubin. Since she is leaning towards breast conservation we will plan for a left needle localized lumpectomy with sentinel node biopsy and right-sided MediPort placement. She was also offered neoadjuvant treatment by Dr. Rubin and she is planning on considering this and will let us know if she would like to proceed with neoadjuvant treatment or proceed with standard breast conservation. In the meantime, will set her up for her planned procedure and I did discuss with her the risks of infection, bleeding, pain, scarring, pneumothorax and hemothorax, need for open surgery and port malfunction and she expressed understanding and desires to proceed with MediPort placement we also discussed the risks of lumpectomy and sentinel node biopsy including the need for reexcision of margins and recurrence poor cosmesis and wound infection as well as the need for radiation therapy. She understands that she may need mastectomy potential is well if we are unable to obtain clear margins with breast conservation. At lease 45 minutes was spent counseling this patient.  Latrail Pounders DAVID 06/11/2011, 12:59 PM     

## 2011-06-24 NOTE — Anesthesia Procedure Notes (Addendum)
Procedure Name: LMA Insertion Performed by: Sharyne Richters Pre-anesthesia Checklist: Patient identified, Emergency Drugs available, Suction available, Patient being monitored and Timeout performed Patient Re-evaluated:Patient Re-evaluated prior to inductionOxygen Delivery Method: Circle System Utilized Preoxygenation: Pre-oxygenation with 100% oxygen Intubation Type: IV induction LMA: LMA with gastric port inserted LMA Size: 4.0 Number of attempts: 1 Placement Confirmation: breath sounds checked- equal and bilateral and positive ETCO2 Tube secured with: Tape Dental Injury: Teeth and Oropharynx as per pre-operative assessment

## 2011-06-24 NOTE — Progress Notes (Signed)
Emotional support to patient during breast injections 

## 2011-06-24 NOTE — Anesthesia Preprocedure Evaluation (Addendum)
Anesthesia Evaluation  Patient identified by MRN, date of birth, ID band Patient awake    Airway Mallampati: I TM Distance: >3 FB Neck ROM: Full    Dental   Pulmonary    Pulmonary exam normal       Cardiovascular hypertension,     Neuro/Psych    GI/Hepatic   Endo/Other    Renal/GU      Musculoskeletal   Abdominal   Peds  Hematology   Anesthesia Other Findings   Reproductive/Obstetrics                           Anesthesia Physical Anesthesia Plan  ASA: II  Anesthesia Plan: General   Post-op Pain Management:    Induction: Intravenous  Airway Management Planned: LMA  Additional Equipment:   Intra-op Plan:   Post-operative Plan: Extubation in OR  Informed Consent: I have reviewed the patients History and Physical, chart, labs and discussed the procedure including the risks, benefits and alternatives for the proposed anesthesia with the patient or authorized representative who has indicated his/her understanding and acceptance.   Dental Advisory Given  Plan Discussed with: CRNA and Surgeon  Anesthesia Plan Comments:        Anesthesia Quick Evaluation

## 2011-06-24 NOTE — Interval H&P Note (Signed)
History and Physical Interval Note:  06/24/2011 1:06 PM  Martha Pham  has presented today for surgery, with the diagnosis of Left breast cancer  The various methods of treatment have been discussed with the patient and family. After consideration of risks, benefits and other options for treatment, the patient has consented to  Procedure(s): BREAST LUMPECTOMY WITH SENTINEL LYMPH NODE BX INSERTION PORT-A-CATH as a surgical intervention .  The patients' history has been reviewed, patient examined, no change in status, stable for surgery.  I have reviewed the patients' chart and labs.  Questions were answered to the patient's satisfaction.     Lodema Pilot DAVID  Date of Initial H&P:06/11/11  History reviewed, patient examined, no change in status, stable for surgery. Sites marked with the patient.  Needle placed.  Risks of procedure again discussed including need for future surgery, poor cosmesis, recurrence, and nerve injury and lymphedema and she desires to proceed with lt lumpectomy/SLN bx/ right mediport.  Lodema Pilot DAVID 06/24/2011 1:06 PM

## 2011-06-24 NOTE — Brief Op Note (Signed)
06/24/2011  4:54 PM  PATIENT:  Kittie Plater  60 y.o. female  PRE-OPERATIVE DIAGNOSIS:  Left breast cancer  POST-OPERATIVE DIAGNOSIS:  Left breast cancer  PROCEDURE:  Procedure(s): BREAST LUMPECTOMY WITH SENTINEL LYMPH NODE BX INSERTION PORT-A-CATH  SURGEON:  Surgeon(s): Rulon Abide, DO  PHYSICIAN ASSISTANT:   ASSISTANTS: none   ANESTHESIA:   general  EBL:  Total I/O In: 2500 [I.V.:2500] Out: -   BLOOD ADMINISTERED:none  DRAINS: none and 8 F MRI powerport   LOCAL MEDICATIONS USED:  MARCAINE 22CC and LIDOCAINE 22CC  SPECIMEN:  Source of Specimen:  Sentinel lymph node x2, left lumpectomy short stitch superior margin,/long lateral  DISPOSITION OF SPECIMEN:  PATHOLOGY  COUNTS:  YES  TOURNIQUET:  * No tourniquets in log *  DICTATION: .Other Dictation: Dictation Number [id number]  PLAN OF CARE: Discharge to home after PACU  PATIENT DISPOSITION:  PACU - hemodynamically stable.   Delay start of Pharmacological VTE agent (>24hrs) due to surgical blood loss or risk of bleeding:  {YES/NO/NOT APPLICABLE:20182

## 2011-06-24 NOTE — Anesthesia Postprocedure Evaluation (Signed)
  Anesthesia Post-op Note  Patient: Martha Pham  Procedure(s) Performed:  BREAST LUMPECTOMY WITH SENTINEL LYMPH NODE BX - Left needle localization lumpectomy w/ sentinel node biospy; INSERTION PORT-A-CATH - Right mediport placement  Patient Location: PACU  Anesthesia Type: General  Level of Consciousness: awake, alert  and oriented  Airway and Oxygen Therapy: Patient Spontanous Breathing  Post-op Pain: moderate  Post-op Assessment: Post-op Vital signs reviewed, Patient's Cardiovascular Status Stable, Respiratory Function Stable, Patent Airway, No signs of Nausea or vomiting and Adequate PO intake  Post-op Vital Signs: stable  Complications: No apparent anesthesia complications

## 2011-06-24 NOTE — Discharge Instructions (Signed)
[facility]  [address] , [place] [address] [phone]   Post Anesthesia Home Care Instructions  Activity: Get plenty of rest for the remainder of the day. A responsible adult should stay with you for 24 hours following the procedure.  For the next 24 hours, DO NOT: -Drive a car -Operate machinery -Drink alcoholic beverages -Take any medication unless instructed by your physician -Make any legal decisions or sign important papers.  Meals: Start with liquid foods such as gelatin or soup. Progress to regular foods as tolerated. Avoid greasy, spicy, heavy foods. If nausea and/or vomiting occur, drink only clear liquids until the nausea and/or vomiting subsides. Call your physician if vomiting continues.  Special Instructions/Symptoms: Your throat may feel dry or sore from the anesthesia or the breathing tube placed in your throat during surgery. If this causes discomfort, gargle with warm salt water. The discomfort should disappear within 24 hours.   

## 2011-06-25 NOTE — Op Note (Signed)
Martha Pham, Martha Pham                ACCOUNT NO.:  [id number]  MEDICAL RECORD NO.:  [id number]  LOCATION:  XRAY                         FACILITY:  [facility]  PHYSICIAN:  Lodema Pilot, MD       DATE OF BIRTH:  [birth year]  DATE OF PROCEDURE:  06/24/2011 DATE OF DISCHARGE:                              OPERATIVE REPORT   PROCEDURE:  Left breast needle-localized lumpectomy with sentinel lymph node biopsy and right ultrasound-guided placement of internal jugular vein MediPort.  PREOPERATIVE DIAGNOSIS:  Left breast cancer.  POSTOPERATIVE DIAGNOSIS:  Left breast cancer.  SURGEON:  Lodema Pilot, MD  ASSISTANT:  None.  ANESTHESIA:  General LMA anesthesia with 43 mL of 1% lidocaine with epinephrine and 0.25% Marcaine injected in a 50:50 mixture.  DRAINS:  An 8-French MRI-compatible PowerPort placed in the right internal jugular vein.  SPECIMENS: 1. Sentinel lymph node #1, level 1 lymph node which was hot but not     blue, with a gamma count of 300. 2. Sentinel lymph node #2, level 1 lymph node which was hot but not     blue, with a gamma count of 245. 3. Left breast needle-localized lumpectomy with a short stitch marking     in the superior margin and the long stitch marking in the lateral     margin.  COMPLICATIONS:  None apparent.  FINDINGS:  Normal-appearing sentinel lymph nodes and a needle-localized lumpectomy specimen in the left breast with a short stitch marking in the superior margin and a long stitch marking in the lateral margin, and ultrasound-guided placement of 8-French internal jugular vein MediPort.  INDICATION FOR PROCEDURE:  Martha Pham is a 60 year old female with a recently diagnosed left breast cancer, who is interested in breast conservation.  She has already met with Surgery and Radiation and Medical Oncology, and the plan is for breast conservation with adjuvant chemotherapy.  OPERATIVE DETAILS:  Martha Pham had already been seen by [facility]  and needle localization of her left breast cancer was performed.  Then, she came to [facility] for definitive procedure, and she was seen and evaluated in the preoperative area and risks and benefits of the procedure were again discussed in lay terms.  Informed consent was obtained.  Her left breast was marked and the MediPort site was marked with the patient prior to anesthesia administration. Prophylactic antibiotics were given and she was taken to the operating room, placed on the table in a supine position.  Her right arm was tucked and her left arm left extended and general LMA anesthesia was obtained.  Prior to the sentinel lymph node biopsy, I prepped the breast with alcohol wipes and 5 mL of methylene blue were injected in a retroareolar fashion and a mL was injected in a peritumoral fashion and 5 minutes of vigorous breast massage were performed.  Then, her left chest, axilla, and arm were prepped and draped in a standard surgical fashion.  Her left arm was prepped in sterile in the operative field. Then, using the Neoprobe, I localized the area of greatest activity in the axilla and just inferior to the hair-bearing area of the axilla. Transverse incision was made  and dissection carried down through the subcutaneous tissue using Bovie electrocautery.  Clavipectoral fascia was divided and the axillary fat was entered.  Gamma probe was used to guide the dissection and using blunt dissection, identified a level 1 lymph node which was hot but not blue.  The lymph node was elevated with a tonsil clamp and dissected circumferentially using small amount of Bovie electrocautery and any lymphovascular structures were clipped with hemoclips and divided.  The lymph node was removed, was hot but not blue, with a gamma count of 300.  The probe was placed back into the axilla and there was still significant activity and another level 1 lymph node was identified and removed in similar  fashion with in this site, a gamma count of 245, which was hot but not blue.  Both of these lymph nodes were not enlarged and normal in appearance and were sent to Pathology for permanent sectioning.  There was no other palpable or visible lymph nodes and the basin count in the maxilla was 8.  The axilla was irrigated with sterile saline solution and noted to be hemostatic and the clavipectoral fascia was approximated with figure-of- eight 3-0 Vicryl sutures.  Then, the skin edges were approximated with 4- 0 Monocryl subcuticular suture and attention was then turned to the needle-localized lumpectomy.  The circumareolar incision was made in the skin inferior and medial to the previously placed guidewire and a subcutaneous breast flaps of approximately 1-cm thick were elevated circumferentially and the guidewire was divided and brought into the incision and the dissection was carried down circumferentially around the wire using Bovie electrocautery and carried down to the chest wall. The pectoralis fascia was identified but not entered and has the deep margin.  Then, the specimen was able to be delivered from the wound and the superior margin was marked with a short silk stitch and a long stitch was placed on the lateral margin.  The lumpectomy specimen was then undermined and removed and x-ray in the operating room revealed that the previously placed biopsy clip was in the center of the specimen and the mass was visible again in the center of the specimen.  There were no other palpable masses or obvious visible abnormalities.  The wound was then inspected for hemostasis which was obtained with Bovie electrocautery and the wound was irrigated with sterile saline solution. Then, the deep dermis was approximated with interrupted 3-0 Vicryl sutures and the central 2 Vicryl sutures were now secured and the cavity was accessed with a 20-gauge angiogram catheter and the biopsy cavity was  infiltrated with 40 mL of 1% lidocaine with epinephrine and 0.25% Marcaine plain, and the final 2 dermal sutures were tied for watertight closure.  Then, the skin edges were approximated with a 4-0 Monocryl subcuticular suture and the breast and axillary incisions were washed and dried, and benzoin and Steri-Strips were applied and a sterile dressing was applied, and the patient was redraped and reprepped on the right for MediPort placement and a  new set of sterile instruments were obtained.  The right neck was prepped using real-time ultrasound guidance with high-frequency ultrasound linear probe.  The internal jugular vein was identified and accessed with the access needle, visualizing, with ultrasound guidance, the access needle entering the vein.  Nonpulsatile venous appearing blood was returned.  The guidewire was passed without difficulty.  The guidewire was then clipped to the drape and subcutaneous reservoir was created for the port placement just inferior to the clavicle.  The  port and catheter were flushed and the port was sutured to the pectoral fascia using 2-0 Prolene sutures at the 10 o'clock and 2 o'clock position on the port, however, they were not secured at this time.  Then, the catheter tubing was tunneled from the access site to the reservoir site and the internal jugular vein was dilated under fluoroscopic guidance over the guidewire using fluoroscopic guidance.  Then, the guidewire and dilator were removed and catheter was placed into the introducer sheath and the sheath was peeled away, leaving the catheter in the neck.  Then, under fluoroscopic guidance, the catheter was pulled back to the atriocaval junction and cut and secured to the port reservoir and locked into place with the locking device.  The sutures were secured.  The port was placed in the previously created reservoir and 2-0 Prolene sutures were secured.  The port was accessed and aspirated without  difficulty with nonpulsatile dark venous blood and the catheter was flushed without difficulty that was accessed again and the final heparin solution was flushed through the catheter with 100 unit/mL heparin solution.  The dermis was then approximated with a running 3-0 Vicryl suture and the skin edges were approximated with a 4-0 Monocryl subcuticular suture and all skin incisions.  Skin was washed and dried and Dermabond was applied.  All sponge, needle, and instrument counts were correct at the end of the case and the patient tolerated the procedure well without apparent complication.  She was stable and ready for transfer to the recovery room in stable condition.          ______________________________ Lodema Pilot, MD     BL/MEDQ  D:  06/24/2011  T:  06/24/2011  Job:  [id number]

## 2011-06-27 ENCOUNTER — Other Ambulatory Visit (HOSPITAL_COMMUNITY): Payer: BC Managed Care – PPO

## 2011-06-27 ENCOUNTER — Ambulatory Visit (HOSPITAL_COMMUNITY): Payer: BC Managed Care – PPO

## 2011-06-27 ENCOUNTER — Encounter (HOSPITAL_COMMUNITY): Payer: BC Managed Care – PPO

## 2011-06-27 ENCOUNTER — Encounter (HOSPITAL_BASED_OUTPATIENT_CLINIC_OR_DEPARTMENT_OTHER): Payer: Self-pay | Admitting: General Surgery

## 2011-07-01 ENCOUNTER — Other Ambulatory Visit: Payer: BC Managed Care – PPO

## 2011-07-02 ENCOUNTER — Encounter: Payer: Self-pay | Admitting: *Deleted

## 2011-07-02 NOTE — Progress Notes (Signed)
Mailed after appt letter to pt. 

## 2011-07-04 ENCOUNTER — Encounter (HOSPITAL_COMMUNITY): Payer: Self-pay

## 2011-07-04 ENCOUNTER — Encounter (HOSPITAL_COMMUNITY)
Admission: RE | Admit: 2011-07-04 | Discharge: 2011-07-04 | Disposition: A | Discharge status: Home / Self Care | Payer: BC Managed Care – PPO | Source: Ambulatory Visit | Attending: Oncology | Admitting: Oncology

## 2011-07-04 DIAGNOSIS — C50219 Malignant neoplasm of upper-inner quadrant of unspecified female breast: Secondary | ICD-10-CM

## 2011-07-04 DIAGNOSIS — C50919 Malignant neoplasm of unspecified site of unspecified female breast: Secondary | ICD-10-CM | POA: Insufficient documentation

## 2011-07-04 DIAGNOSIS — K7689 Other specified diseases of liver: Secondary | ICD-10-CM | POA: Insufficient documentation

## 2011-07-04 MED ORDER — IOHEXOL 300 MG/ML  SOLN
100.0000 mL | Freq: Once | INTRAMUSCULAR | Status: AC | PRN
  Administered 2011-07-04: 100 mL via INTRAVENOUS

## 2011-07-04 MED ORDER — TECHNETIUM TC 99M MEDRONATE IV KIT
25.0000 | PACK | Freq: Once | INTRAVENOUS | Status: AC | PRN
  Administered 2011-07-04: 25 via INTRAVENOUS

## 2011-07-07 ENCOUNTER — Ambulatory Visit (HOSPITAL_BASED_OUTPATIENT_CLINIC_OR_DEPARTMENT_OTHER): Payer: BC Managed Care – PPO | Admitting: Oncology

## 2011-07-07 ENCOUNTER — Encounter: Payer: Self-pay | Admitting: *Deleted

## 2011-07-07 ENCOUNTER — Other Ambulatory Visit: Payer: Self-pay | Admitting: *Deleted

## 2011-07-07 ENCOUNTER — Telehealth: Payer: Self-pay | Admitting: *Deleted

## 2011-07-07 ENCOUNTER — Other Ambulatory Visit: Payer: Self-pay | Admitting: Oncology

## 2011-07-07 VITALS — BP 126/77 | HR 83 | Temp 98.4°F | Ht 59.5 in | Wt 165.3 lb

## 2011-07-07 DIAGNOSIS — C50919 Malignant neoplasm of unspecified site of unspecified female breast: Secondary | ICD-10-CM

## 2011-07-07 DIAGNOSIS — C50219 Malignant neoplasm of upper-inner quadrant of unspecified female breast: Secondary | ICD-10-CM

## 2011-07-07 MED ORDER — INFLUENZA VIRUS VACC SPLIT PF IM SUSP
0.5000 mL | INTRAMUSCULAR | Status: DC
  Filled 2011-07-07: qty 0.5

## 2011-07-07 NOTE — Progress Notes (Signed)
Martha Pham returns for f/u. Surgery on 12/4 revealed a 2.2 cm N-, TN breast cancer, grade III. She has a port placed, with staging scans all of which were negative. I discussed the B49 study with her , which is a randomization between TC x 6, vs AC followed by taxol. She is meeting with one of the research coordinators and will let us know her decision.  In any event she wishes to start after xmas. I have given her info re- wigs . She will let usknow how she wants to proceed.

## 2011-07-07 NOTE — Telephone Encounter (Signed)
gave patient appointment for 07-17-2011 printed out calendar and gave to the patient

## 2011-07-10 ENCOUNTER — Ambulatory Visit (INDEPENDENT_AMBULATORY_CARE_PROVIDER_SITE_OTHER): Payer: BC Managed Care – PPO | Admitting: General Surgery

## 2011-07-10 ENCOUNTER — Encounter (INDEPENDENT_AMBULATORY_CARE_PROVIDER_SITE_OTHER): Payer: Self-pay | Admitting: General Surgery

## 2011-07-10 VITALS — BP 128/68 | HR 70 | Temp 96.9°F | Resp 18 | Ht 59.0 in | Wt 162.0 lb

## 2011-07-10 DIAGNOSIS — Z4889 Encounter for other specified surgical aftercare: Secondary | ICD-10-CM

## 2011-07-10 DIAGNOSIS — Z5189 Encounter for other specified aftercare: Secondary | ICD-10-CM

## 2011-07-10 NOTE — Progress Notes (Signed)
Subjective:     Patient ID: Martha Pham, female   DOB: [birth year], 60 y.o.   MRN: [id number]  HPI She follows up status post needle localized left lumpectomy with sentinel lymph node biopsy. She's doing well and her pain is controlled she has had some slight numbness in her arm although this has improved. She has overall good cosmesis and her pathology results were reviewed and her lymph nodes were negative and she had a 2.2 cm cancer. 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   Review of Systems     Objective:   Physical Exam She has good cosmesis at her wound is healing well without sign of infection.    Assessment:     Status post left lumpectomy with sentinel lymph node biopsy-doing well    Plan:     She has a close lateral margin and I explained that even though her margins are technically negative she does have a close lateral margin and I explained that to my goal is for 10 mm of normal tissue I recommended reexcision for anything less than 2 mm. I explained that this would not increase her survival but a wider margin should decrease her local recurrence rate. In this case, I think that she has ample breast tissue to support reexcision of a close margin. She is undecided if she would like to proceed with reexcision and I recommended that she discussed this with Dr. Michell Heinrich and Dr. Donnie Coffin for additional counsel. Again, her margins aren't technically negative and she will be receiving chemotherapy and radiation so I think either way, she should have a good outcome. She will call me back and let me know if she would like to proceed with reexcision of margins after she discusses this with the rest of her cancer team.

## 2011-07-11 ENCOUNTER — Other Ambulatory Visit: Payer: Self-pay | Admitting: Oncology

## 2011-07-17 ENCOUNTER — Ambulatory Visit: Payer: BC Managed Care – PPO

## 2011-07-21 ENCOUNTER — Telehealth (INDEPENDENT_AMBULATORY_CARE_PROVIDER_SITE_OTHER): Payer: Self-pay | Admitting: General Surgery

## 2011-07-22 DIAGNOSIS — Z9221 Personal history of antineoplastic chemotherapy: Secondary | ICD-10-CM

## 2011-07-22 HISTORY — PX: REDUCTION MAMMAPLASTY: SUR839

## 2011-07-22 HISTORY — DX: Personal history of antineoplastic chemotherapy: Z92.21

## 2011-07-24 ENCOUNTER — Other Ambulatory Visit (INDEPENDENT_AMBULATORY_CARE_PROVIDER_SITE_OTHER): Payer: Self-pay | Admitting: General Surgery

## 2011-08-05 ENCOUNTER — Encounter (HOSPITAL_BASED_OUTPATIENT_CLINIC_OR_DEPARTMENT_OTHER): Payer: Self-pay | Admitting: *Deleted

## 2011-08-05 NOTE — Progress Notes (Signed)
For re-excision lt lumpectomy To come in for bmet

## 2011-08-06 ENCOUNTER — Encounter (HOSPITAL_BASED_OUTPATIENT_CLINIC_OR_DEPARTMENT_OTHER)
Admission: RE | Admit: 2011-08-06 | Discharge: 2011-08-06 | Disposition: A | Discharge status: Home / Self Care | Payer: BC Managed Care – PPO | Source: Ambulatory Visit | Attending: General Surgery | Admitting: General Surgery

## 2011-08-06 LAB — BASIC METABOLIC PANEL
Chloride: 105 mEq/L (ref 96–112)
GFR calc Af Amer: >90 mL/min (ref >90)
GFR calc non Af Amer: >90 mL/min (ref >90)
Glucose, Bld: 88 mg/dL (ref 70–99)
Potassium: 3.7 mEq/L (ref 3.5–5.1)
Sodium: 142 mEq/L (ref 135–145)

## 2011-08-06 MED ORDER — CEFAZOLIN SODIUM 1-5 GM-% IV SOLN
INTRAVENOUS | Status: AC
  Filled 2011-08-06: qty 50

## 2011-08-07 ENCOUNTER — Encounter (HOSPITAL_BASED_OUTPATIENT_CLINIC_OR_DEPARTMENT_OTHER): Payer: Self-pay | Admitting: Anesthesiology

## 2011-08-07 ENCOUNTER — Ambulatory Visit (HOSPITAL_BASED_OUTPATIENT_CLINIC_OR_DEPARTMENT_OTHER): Payer: BC Managed Care – PPO | Admitting: Anesthesiology

## 2011-08-07 ENCOUNTER — Ambulatory Visit (HOSPITAL_BASED_OUTPATIENT_CLINIC_OR_DEPARTMENT_OTHER)
Admission: RE | Admit: 2011-08-07 | Discharge: 2011-08-07 | Disposition: A | Discharge status: Home / Self Care | Payer: BC Managed Care – PPO | Source: Ambulatory Visit | Attending: General Surgery | Admitting: General Surgery

## 2011-08-07 ENCOUNTER — Encounter (HOSPITAL_BASED_OUTPATIENT_CLINIC_OR_DEPARTMENT_OTHER)
Admission: RE | Disposition: A | Discharge status: Home / Self Care | Payer: Self-pay | Source: Ambulatory Visit | Attending: General Surgery

## 2011-08-07 ENCOUNTER — Other Ambulatory Visit (INDEPENDENT_AMBULATORY_CARE_PROVIDER_SITE_OTHER): Payer: Self-pay | Admitting: General Surgery

## 2011-08-07 ENCOUNTER — Encounter (HOSPITAL_BASED_OUTPATIENT_CLINIC_OR_DEPARTMENT_OTHER): Payer: Self-pay | Admitting: *Deleted

## 2011-08-07 DIAGNOSIS — C50919 Malignant neoplasm of unspecified site of unspecified female breast: Secondary | ICD-10-CM

## 2011-08-07 DIAGNOSIS — C50219 Malignant neoplasm of upper-inner quadrant of unspecified female breast: Secondary | ICD-10-CM

## 2011-08-07 DIAGNOSIS — Z01812 Encounter for preprocedural laboratory examination: Secondary | ICD-10-CM | POA: Insufficient documentation

## 2011-08-07 DIAGNOSIS — I10 Essential (primary) hypertension: Secondary | ICD-10-CM | POA: Insufficient documentation

## 2011-08-07 HISTORY — PX: BREAST SURGERY: SHX581

## 2011-08-07 SURGERY — EXCISION OF BREAST BIOPSY
Anesthesia: General | Site: Breast | Laterality: Left | Wound class: Clean

## 2011-08-07 MED ORDER — DEXAMETHASONE SODIUM PHOSPHATE 4 MG/ML IJ SOLN
INTRAMUSCULAR | Status: DC | PRN
  Administered 2011-08-07: 10 mg via INTRAVENOUS

## 2011-08-07 MED ORDER — MIDAZOLAM HCL 2 MG/2ML IJ SOLN: 0.5000 mg | INTRAMUSCULAR | Status: DC | PRN

## 2011-08-07 MED ORDER — LIDOCAINE HCL (CARDIAC) 20 MG/ML IV SOLN
INTRAVENOUS | Status: DC | PRN
  Administered 2011-08-07: 40 mg via INTRAVENOUS

## 2011-08-07 MED ORDER — CEFAZOLIN SODIUM 1-5 GM-% IV SOLN
1.0000 g | INTRAVENOUS | Status: AC
  Administered 2011-08-07: 1 g via INTRAVENOUS

## 2011-08-07 MED ORDER — EPHEDRINE SULFATE 50 MG/ML IJ SOLN
INTRAMUSCULAR | Status: DC | PRN
  Administered 2011-08-07: 10 mg via INTRAVENOUS

## 2011-08-07 MED ORDER — LIDOCAINE-EPINEPHRINE 1 %-1:100000 IJ SOLN
INTRAMUSCULAR | Status: DC | PRN
  Administered 2011-08-07: 20 mL

## 2011-08-07 MED ORDER — LACTATED RINGERS IV SOLN
INTRAVENOUS | Status: DC
  Administered 2011-08-07 (×2): via INTRAVENOUS

## 2011-08-07 MED ORDER — BUPIVACAINE HCL 0.25 % IJ SOLN
INTRAMUSCULAR | Status: DC | PRN
  Administered 2011-08-07: 30 mL

## 2011-08-07 MED ORDER — MORPHINE SULFATE 2 MG/ML IJ SOLN: 0.0500 mg/kg | INTRAMUSCULAR | Status: DC | PRN

## 2011-08-07 MED ORDER — ONDANSETRON HCL 4 MG/2ML IJ SOLN
INTRAMUSCULAR | Status: DC | PRN
  Administered 2011-08-07: 4 mg via INTRAVENOUS

## 2011-08-07 MED ORDER — METOCLOPRAMIDE HCL 5 MG/ML IJ SOLN: 10.0000 mg | Freq: Once | INTRAMUSCULAR | Status: DC | PRN

## 2011-08-07 MED ORDER — PROPOFOL 10 MG/ML IV EMUL
INTRAVENOUS | Status: DC | PRN
  Administered 2011-08-07: 200 mg via INTRAVENOUS

## 2011-08-07 MED ORDER — ACETAMINOPHEN 10 MG/ML IV SOLN
1000.0000 mg | Freq: Once | INTRAVENOUS | Status: AC
  Administered 2011-08-07: 1000 mg via INTRAVENOUS

## 2011-08-07 MED ORDER — FENTANYL CITRATE 0.05 MG/ML IJ SOLN
INTRAMUSCULAR | Status: DC | PRN
  Administered 2011-08-07: 25 ug via INTRAVENOUS
  Administered 2011-08-07: 50 ug via INTRAVENOUS
  Administered 2011-08-07 (×3): 25 ug via INTRAVENOUS

## 2011-08-07 MED ORDER — HYDROCODONE-ACETAMINOPHEN 5-500 MG PO TABS: 1.0000 | ORAL_TABLET | Freq: Four times a day (QID) | ORAL | Status: AC | PRN

## 2011-08-07 MED ORDER — MIDAZOLAM HCL 5 MG/5ML IJ SOLN
INTRAMUSCULAR | Status: DC | PRN
  Administered 2011-08-07: 1 mg via INTRAVENOUS

## 2011-08-07 MED ORDER — DROPERIDOL 2.5 MG/ML IJ SOLN
INTRAMUSCULAR | Status: DC | PRN
  Administered 2011-08-07: 0.625 mg via INTRAVENOUS

## 2011-08-07 MED ORDER — FENTANYL CITRATE 0.05 MG/ML IJ SOLN
25.0000 ug | INTRAMUSCULAR | Status: DC | PRN
  Administered 2011-08-07 (×2): 25 ug via INTRAVENOUS
  Administered 2011-08-07: 50 ug via INTRAVENOUS

## 2011-08-07 SURGICAL SUPPLY — 46 items
106365 IMPLANT
APL SKNCLS STERI-STRIP NONHPOA (GAUZE/BANDAGES/DRESSINGS) ×1
BENZOIN TINCTURE PRP APPL 2/3 (GAUZE/BANDAGES/DRESSINGS) ×1 IMPLANT
BINDER BREAST LRG (GAUZE/BANDAGES/DRESSINGS) IMPLANT
BINDER BREAST MEDIUM (GAUZE/BANDAGES/DRESSINGS) IMPLANT
BINDER BREAST XLRG (GAUZE/BANDAGES/DRESSINGS) IMPLANT
BINDER BREAST XXLRG (GAUZE/BANDAGES/DRESSINGS) IMPLANT
BLADE SURG 15 STRL LF DISP TIS (BLADE) ×1 IMPLANT
BLADE SURG 15 STRL SS (BLADE) ×2
CANISTER SUCTION 1200CC (MISCELLANEOUS) ×2 IMPLANT
CHLORAPREP W/TINT 26ML (MISCELLANEOUS) ×2 IMPLANT
CLIP TI WIDE RED SMALL 6 (CLIP) ×2 IMPLANT
COVER MAYO STAND STRL (DRAPES) ×2 IMPLANT
COVER TABLE BACK 60X90 (DRAPES) ×2 IMPLANT
DECANTER SPIKE VIAL GLASS SM (MISCELLANEOUS) IMPLANT
DRAIN PENROSE 1/2X12 LTX STRL (WOUND CARE) ×2 IMPLANT
DRAPE PED LAPAROTOMY (DRAPES) ×2 IMPLANT
DRAPE UTILITY XL STRL (DRAPES) ×2 IMPLANT
ELECT COATED BLADE 2.86 ST (ELECTRODE) ×2 IMPLANT
ELECT REM PT RETURN 9FT ADLT (ELECTROSURGICAL) ×2
ELECTRODE REM PT RTRN 9FT ADLT (ELECTROSURGICAL) ×1 IMPLANT
GAUZE SPONGE 4X4 12PLY STRL LF (GAUZE/BANDAGES/DRESSINGS) ×2 IMPLANT
GLOVE BIOGEL M 7.0 STRL (GLOVE) ×1 IMPLANT
GLOVE BIOGEL PI IND STRL 7.0 (GLOVE) IMPLANT
GLOVE BIOGEL PI INDICATOR 7.0 (GLOVE) ×1
GLOVE SURG SS PI 7.5 STRL IVOR (GLOVE) ×4 IMPLANT
GOWN PREVENTION PLUS XLARGE (GOWN DISPOSABLE) ×1 IMPLANT
GOWN PREVENTION PLUS XXLARGE (GOWN DISPOSABLE) ×2 IMPLANT
NEEDLE HYPO 22GX1.5 SAFETY (NEEDLE) ×2 IMPLANT
NS IRRIG 1000ML POUR BTL (IV SOLUTION) IMPLANT
PACK BASIN DAY SURGERY FS (CUSTOM PROCEDURE TRAY) ×2 IMPLANT
PENCIL BUTTON HOLSTER BLD 10FT (ELECTRODE) ×2 IMPLANT
SLEEVE SCD COMPRESS KNEE MED (MISCELLANEOUS) ×1 IMPLANT
SPONGE GAUZE 4X4 12PLY (GAUZE/BANDAGES/DRESSINGS) ×1 IMPLANT
SPONGE LAP 4X18 X RAY DECT (DISPOSABLE) ×2 IMPLANT
STRIP CLOSURE SKIN 1/2X4 (GAUZE/BANDAGES/DRESSINGS) ×1 IMPLANT
SUT MNCRL AB 4-0 PS2 18 (SUTURE) ×2 IMPLANT
SUT SILK 2 0 SH (SUTURE) ×2 IMPLANT
SUT VICRYL 3-0 CR8 SH (SUTURE) ×2 IMPLANT
SYR CONTROL 10ML LL (SYRINGE) ×2 IMPLANT
TAPE CLOTH SURG 4X10 WHT LF (GAUZE/BANDAGES/DRESSINGS) ×1 IMPLANT
TOWEL OR 17X24 6PK STRL BLUE (TOWEL DISPOSABLE) ×2 IMPLANT
TOWEL OR NON WOVEN STRL DISP B (DISPOSABLE) ×2 IMPLANT
TUBE CONNECTING 20X1/4 (TUBING) ×2 IMPLANT
WATER STERILE IRR 1000ML POUR (IV SOLUTION) ×2 IMPLANT
YANKAUER SUCT BULB TIP NO VENT (SUCTIONS) ×2 IMPLANT

## 2011-08-07 NOTE — Anesthesia Postprocedure Evaluation (Signed)
Anesthesia Post Note  Patient: Martha Pham  Procedure(s) Performed:  EXCISION OF BREAST BIOPSY - reexcision of left breast margins  Anesthesia type: General  Patient location: PACU  Post pain: Pain level controlled  Post assessment: Patient's Cardiovascular Status Stable  Last Vitals:  Filed Vitals:   08/07/11 1108  BP: 127/68  Pulse: 69  Temp: 36.6 C  Resp: 16    Post vital signs: Reviewed and stable  Level of consciousness: alert  Complications: No apparent anesthesia complications

## 2011-08-07 NOTE — Anesthesia Preprocedure Evaluation (Addendum)
Anesthesia Evaluation  Patient identified by MRN, date of birth, ID band Patient awake    Reviewed: Allergy & Precautions, H&P , NPO status , Patient's Chart, lab work & pertinent test results, reviewed documented beta blocker date and time   Airway Mallampati: II TM Distance: >3 FB Neck ROM: full    Dental  (+) Dental Advisory Given   Pulmonary neg pulmonary ROS,          Cardiovascular hypertension, On Medications     Neuro/Psych Negative Neurological ROS  Negative Psych ROS   GI/Hepatic negative GI ROS, Neg liver ROS,   Endo/Other  Negative Endocrine ROS  Renal/GU negative Renal ROS  Genitourinary negative   Musculoskeletal   Abdominal   Peds  Hematology negative hematology ROS (+)   Anesthesia Other Findings See surgeon's H&P   Reproductive/Obstetrics negative OB ROS                          Anesthesia Physical Anesthesia Plan  ASA: II  Anesthesia Plan: General   Post-op Pain Management:    Induction: Intravenous  Airway Management Planned: LMA  Additional Equipment:   Intra-op Plan:   Post-operative Plan: Extubation in OR  Informed Consent: I have reviewed the patients History and Physical, chart, labs and discussed the procedure including the risks, benefits and alternatives for the proposed anesthesia with the patient or authorized representative who has indicated his/her understanding and acceptance.     Plan Discussed with: CRNA and Surgeon  Anesthesia Plan Comments:         Anesthesia Quick Evaluation

## 2011-08-07 NOTE — Op Note (Signed)
NAMECHARI, Martha Pham                ACCOUNT NO.:  [id number]  MEDICAL RECORD NO.:  [id number]  LOCATION:                                 FACILITY:  PHYSICIAN:  Lodema Pilot, MD       DATE OF BIRTH:  July 07, 1951  DATE OF PROCEDURE:  08/07/2011 DATE OF DISCHARGE:                              OPERATIVE REPORT   PROCEDURE:  Re-excision of left breast margins.  PREOPERATIVE DIAGNOSIS:  Left breast cancer.  POSTOPERATIVE DIAGNOSIS:  Left breast cancer.  SURGEON:  Lodema Pilot, MD  ASSISTANT:  None.  ANESTHESIA:  General LMA anesthesia with 50 mL of 1% lidocaine with epinephrine and 0.25% Marcaine in a 50:50 mixture.  FLUID:  1200 mL of crystalloid.  ESTIMATED BLOOD LOSS:  Minimal.  DRAINS:  None.  SPECIMENS: 1. An old scar. 2. Re-excision lateral margin with a short silk stitch marking the     superior margin of this and a long stitch marking the new lateral     margin. 3. Additional medial margin with a short stitch marking superior     margin and a long stitch marking the new medial margin.  FINDINGS:  Well-defined biopsy cavity, essentially all margins were re- excised with focus on the medial and lateral margins, additional clips were placed.  COMPLICATIONS:  None apparent.  INDICATION FOR PROCEDURE:  Martha Pham is a 61 year old female with a recently diagnosed left breast cancer, which was node negative, but she also has a triple negative tumor and close margins on both the medial and lateral aspects after initial resection.  So, given the triple negative nature and the close margins, we decided to perform re-excision of the left breast margin.  OPERATIVE DETAILS:  Martha Pham was seen and evaluated in the preoperative area and risks and benefits of the procedure were again discussed in lay terms.  Informed consent was obtained.  I again discussed with her the risks of infection, bleeding, pain, scarring, poor cosmesis, and skin dimpling, need for reexcision, and she  expressed understanding and desired to proceed with planned procedure.  Again, prophylactic antibiotics were given, and the surgical site was marked prior to anesthetic administration.  I reviewed her pathology report to confirm reexcision of medial and lateral margins, and she was taken to the operating room, and general LMA anesthesia was obtained.  Her left chest was prepped and draped in a standard surgical fashion and her prior upper inner lumpectomy scar was excised down to the prior biopsy cavity and the scar was completely excised and sent to pathology.  Her biopsy cavity was entered, and she had a well defined biopsy cavity with a healing rind on the inside, some calcification and thickening at the fat necrosis from the fat surrounding the immediate biopsy cavity.  She had ample tissue for reexcision of the medial and lateral margins.  I basically bisected the cavity from a cranial-caudad direction, and started with the lateral margin from the 12 o' clock to 6 o' clock position.  I cored out additional margin of at least a centimeter of tissue down to the pectoralis fascia and a short silk stitch was placed on the superior margin of  this, and a long silk stitch was placed on the new lateral margin, and the specimen was undermined and sent to pathology for permanent section.  Again, the posterior margin is the chest pectoralis muscle and no additional margin can be taken from the posterior aspect.  A similar dissection was performed on the medial margin from the 12 o' clock to 6 o' clock position again any for at least a centimeter of tissue and a silk stitch was placed on the superior margin and a long stitch was placed on the new medial margin. The specimen was undermined and also removed and sent to pathology for permanent sectioning as additional medial margin.  Essentially, additional tissue was taken also from the superior and inferior margins since I resected this from 35  o' clock to 6 o' clock on both the medial and lateral.  If reexcision is necessary, she would still have some additional tissue which would be amenable for resection, however, as I closed the dermis with interrupted 3-0 Vicryl sutures noted that she certainly have a more noticeable dimpling in the area of the resection on the upper inner portion of the breast which would be expected with as much tissue as we had to resect to obtain margins.  I irrigated the wound with sterile saline solution and some sterile water and Bovie electrocautery was used to obtain hemostasis in the biopsy cavity.  The biopsy cavity was noted to be hemostatic, and the dermis was approximated with interrupted 3-0 Vicryl sutures closely to perform water tight closure.  Prior to securing the 2 medial sutures, an 18- gauge Angiocatheter was placed into the biopsy cavity and 50 mL of 1% lidocaine with epinephrine and 0.25% Marcaine was infiltrated into the wound, and the final sutures were secured.  The skin edges were approximated with 4-0 Monocryl subcuticular suture, and skin was washed and dried and benzoin and Steri-Strips were applied, a sterile dressing was applied.  All sponge, needle, and instrument counts were correct at the end of the case.  The patient tolerated the procedure well without apparent complication.          ______________________________ Lodema Pilot, MD     BL/MEDQ  D:  08/07/2011  T:  08/07/2011  Job:  [id number]

## 2011-08-07 NOTE — H&P (View-Only) (Signed)
Subjective:     Patient ID: Martha Pham, female   DOB: 11/24/1950, 60 y.o.   MRN: [id number]  HPI She follows up status post needle localized left lumpectomy with sentinel lymph node biopsy. She's doing well and her pain is controlled she has had some slight numbness in her arm although this has improved. She has overall good cosmesis and her pathology results were reviewed and her lymph nodes were negative and she had a 2.2 cm cancer. 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   Review of Systems     Objective:   Physical Exam She has good cosmesis at her wound is healing well without sign of infection.    Assessment:     Status post left lumpectomy with sentinel lymph node biopsy-doing well    Plan:     She has a close lateral margin and I explained that even though her margins are technically negative she does have a close lateral margin and I explained that to my goal is for 10 mm of normal tissue I recommended reexcision for anything less than 2 mm. I explained that this would not increase her survival but a wider margin should decrease her local recurrence rate. In this case, I think that she has ample breast tissue to support reexcision of a close margin. She is undecided if she would like to proceed with reexcision and I recommended that she discussed this with Dr. Wentworth and Dr. Rubin for additional counsel. Again, her margins aren't technically negative and she will be receiving chemotherapy and radiation so I think either way, she should have a good outcome. She will call me back and let me know if she would like to proceed with reexcision of margins after she discusses this with the rest of her cancer team.      

## 2011-08-07 NOTE — Anesthesia Procedure Notes (Signed)
Procedure Name: LMA Insertion Date/Time: 08/07/2011 7:54 AM Performed by: Lorrin Jackson Pre-anesthesia Checklist: Patient identified, Emergency Drugs available, Suction available and Patient being monitored Patient Re-evaluated:Patient Re-evaluated prior to inductionOxygen Delivery Method: Circle System Utilized Preoxygenation: Pre-oxygenation with 100% oxygen Intubation Type: IV induction Ventilation: Mask ventilation without difficulty LMA: LMA inserted LMA Size: 4.0 Number of attempts: 1 Airway Equipment and Method: bite block Placement Confirmation: positive ETCO2 Tube secured with: Tape Dental Injury: Teeth and Oropharynx as per pre-operative assessment

## 2011-08-07 NOTE — Transfer of Care (Signed)
Immediate Anesthesia Transfer of Care Note  Patient: Martha Pham  Procedure(s) Performed:  EXCISION OF BREAST BIOPSY - reexcision of left breast margins  Patient Location: Patient transported to PACU with oxygen via face mask at 6 Liters / Min  Anesthesia Type: General  Level of Consciousness: awake and alert   Airway & Oxygen Therapy: Patient Spontanous Breathing and Patient connected to face mask oxygen Post-op Assessment: Report given to PACU RN and Post -op Vital signs reviewed and stable  Post vital signs: Reviewed and stable  Complications: No apparent anesthesia complications  Filed Vitals:   08/07/11 0638  BP: 135/77  Pulse: 68  Temp: 36.4 C  Resp: 20

## 2011-08-07 NOTE — Interval H&P Note (Signed)
History and Physical Interval Note:  08/07/2011 7:35 AM  Martha Pham  has presented today for surgery, with the diagnosis of left breast cancer  The various methods of treatment have been discussed with the patient and family. After consideration of risks, benefits and other options for treatment, the patient has consented to  Procedure(s): EXCISION OF BREAST BIOPSY as a surgical intervention .  The patients' history has been reviewed, patient examined, no change in status, stable for surgery.  I have reviewed the patients' chart and labs.  Questions were answered to the patient's satisfaction.  Site marked.  Path reviewed and we will plan for reexcision of lateral and medial margins.  Risks of poor cosmesis, infection, bleeding, need for reexcision again discussed.   Lodema Pilot DAVID

## 2011-08-07 NOTE — Brief Op Note (Signed)
08/07/2011  9:26 AM  PATIENT:  Martha Pham  61 y.o. female  PRE-OPERATIVE DIAGNOSIS:  left breast cancer  POST-OPERATIVE DIAGNOSIS:  left breast cancer  PROCEDURE:  Procedure(s): EXCISION OF BREAST BIOPSY  SURGEON:  Surgeon(s): Rulon Abide, DO  PHYSICIAN ASSISTANT:   ASSISTANTS: none   ANESTHESIA:   general  EBL:  Total I/O In: 1200 [I.V.:1200] Out: -   BLOOD ADMINISTERED:none  DRAINS: none   LOCAL MEDICATIONS USED:  MARCAINE 30CC and LIDOCAINE 20CC  SPECIMEN:  Source of Specimen:  old scar, reexcision left lateral margin, reexcision left medial margin  DISPOSITION OF SPECIMEN:  PATHOLOGY  COUNTS:  YES  TOURNIQUET:  * No tourniquets in log *  DICTATION: .Other Dictation: Dictation Number [id number]  PLAN OF CARE: Discharge to home after PACU  PATIENT DISPOSITION:  PACU - hemodynamically stable.   Delay start of Pharmacological VTE agent (>24hrs) due to surgical blood loss or risk of bleeding:  {YES/NO/NOT APPLICABLE:20182

## 2011-08-08 ENCOUNTER — Telehealth (INDEPENDENT_AMBULATORY_CARE_PROVIDER_SITE_OTHER): Payer: Self-pay

## 2011-08-08 NOTE — Telephone Encounter (Signed)
Spoke w/Ms. Revelo she reports she's doing well since surgery on 08/07/11, scheduled post op appt for 08/18/11 w/Dr. Biagio Quint

## 2011-08-18 ENCOUNTER — Ambulatory Visit (INDEPENDENT_AMBULATORY_CARE_PROVIDER_SITE_OTHER): Payer: BC Managed Care – PPO | Admitting: General Surgery

## 2011-08-18 VITALS — BP 130/78 | HR 72 | Temp 97.3°F | Resp 16 | Ht 59.5 in | Wt 162.6 lb

## 2011-08-18 DIAGNOSIS — C50919 Malignant neoplasm of unspecified site of unspecified female breast: Secondary | ICD-10-CM

## 2011-08-18 DIAGNOSIS — C50912 Malignant neoplasm of unspecified site of left female breast: Secondary | ICD-10-CM

## 2011-08-18 NOTE — Progress Notes (Signed)
Subjective:     Patient ID: Martha Pham, female   DOB: [birth year], 61 y.o.   MRN: [id number]  HPI This patient follows up status post reexcision of left breast margins for a left breast cancer. Her margins were initially read as negative but she had a close medial and lateral margin. We recommended reexcision which she recently had and despite generous reexcision, she still had evidence of DCIS in the specimen. She has continued close margins at both the medial and lateral aspects as well after this excision. She has done well from her surgery and has no significant discomfort. She has good cosmesis. She denies any fevers or chills.  Review of Systems     Objective:   Physical Exam Her incision is healing well without sign of infection. Check she has very good cosmesis and there is still no evidence of any dimpling in the area of the medial breast. There is no sign of preoperative complication    Assessment:     A long discussion with her regarding the options of mastectomy with or without immediate reconstruction versus attempted reexcision to obtain negative margins. We discussed the pros and cons of each. I expressed my concern that this might be a multifocal or multicentric cancer for which case mastectomy would be recommended.  This is certainly taking a total mentally on her as would be expected. However, mastectomy with reconstruction would also likely require repeat surgeries. I explained that with mastectomy she would no longer needed radiation therapy. I also explained that even after we attempt another repeat excision, if her margins are still closer she has evidence of multicentric disease, then she would still require mastectomy for treatment. She has a triple negative  tumor and so I think wide margins are important for her. I have recommended him at least recommended another attempt at reexcision to obtain negative margins knowing that this may not be definitive in that she would  still require radiation therapy versus mastectomy. Originally I felt that her breasts are not tolerate much more reexcision, however, she has had a very good cosmetic result and still has no evidence of any skin dimpling. She would like to think about her options and she will get back with me to schedule reexcision or mastectomy.    Plan:     We will await her decision on another attempted reexcision versus mastectomy. If she elects for mastectomy, I will set her up with plastic surgery evaluation to consider immediate reconstruction options.

## 2011-08-22 ENCOUNTER — Telehealth (INDEPENDENT_AMBULATORY_CARE_PROVIDER_SITE_OTHER): Payer: Self-pay

## 2011-08-22 ENCOUNTER — Other Ambulatory Visit (INDEPENDENT_AMBULATORY_CARE_PROVIDER_SITE_OTHER): Payer: Self-pay

## 2011-08-22 ENCOUNTER — Encounter (INDEPENDENT_AMBULATORY_CARE_PROVIDER_SITE_OTHER): Payer: BC Managed Care – PPO | Admitting: General Surgery

## 2011-08-22 DIAGNOSIS — C50919 Malignant neoplasm of unspecified site of unspecified female breast: Secondary | ICD-10-CM

## 2011-08-22 DIAGNOSIS — Z7189 Other specified counseling: Secondary | ICD-10-CM

## 2011-08-22 NOTE — Telephone Encounter (Signed)
Martha Pham decided to proceed with Left Breast Mastectomy, per Dr. Biagio Quint dictation on 08/18/11 will schedule patient with reconstruction consult.  I scheduled Martha Pham w/Dr. Sanger for 09/02/11 @ 2:45pm, demographics, office notes, op notes & pathology reports have been faxed to Dr. Leonie Green office [phone].

## 2011-08-25 ENCOUNTER — Other Ambulatory Visit (INDEPENDENT_AMBULATORY_CARE_PROVIDER_SITE_OTHER): Payer: Self-pay | Admitting: General Surgery

## 2011-08-25 ENCOUNTER — Telehealth (INDEPENDENT_AMBULATORY_CARE_PROVIDER_SITE_OTHER): Payer: Self-pay

## 2011-08-25 DIAGNOSIS — C50912 Malignant neoplasm of unspecified site of left female breast: Secondary | ICD-10-CM

## 2011-08-25 NOTE — Telephone Encounter (Signed)
Orders written for Left Breast Mastectomy placed inbox for OR scheduling/08/26/11 @ 2:05pm

## 2011-08-26 ENCOUNTER — Encounter (HOSPITAL_COMMUNITY): Payer: Self-pay | Admitting: Respiratory Therapy

## 2011-08-28 ENCOUNTER — Telehealth (INDEPENDENT_AMBULATORY_CARE_PROVIDER_SITE_OTHER): Payer: Self-pay | Admitting: General Surgery

## 2011-08-28 NOTE — Telephone Encounter (Signed)
Pt called asking about the appt that was scheduled for her to see a plastic surgeon on the day after her sx, please call before the weekend.

## 2011-08-29 ENCOUNTER — Encounter (HOSPITAL_COMMUNITY): Payer: Self-pay

## 2011-08-29 ENCOUNTER — Encounter (HOSPITAL_COMMUNITY)
Admission: RE | Admit: 2011-08-29 | Discharge: 2011-08-29 | Disposition: A | Discharge status: Home / Self Care | Payer: BC Managed Care – PPO | Source: Ambulatory Visit | Attending: General Surgery | Admitting: General Surgery

## 2011-08-29 DIAGNOSIS — C50912 Malignant neoplasm of unspecified site of left female breast: Secondary | ICD-10-CM

## 2011-08-29 HISTORY — DX: Unspecified osteoarthritis, unspecified site: M19.90

## 2011-08-29 HISTORY — DX: Sleep apnea, unspecified: G47.30

## 2011-08-29 LAB — CBC
MCHC: 33.4 g/dL (ref 30.0–36.0)
MCV: 96.3 fL (ref 78.0–100.0)
Platelets: 200 10*3/uL (ref 150–400)
RDW: 11.9 % (ref 11.5–15.5)
WBC: 5.7 10*3/uL (ref 4.0–10.5)

## 2011-08-29 LAB — BASIC METABOLIC PANEL
BUN: 15 mg/dL (ref 6–23)
CO2: 29 mEq/L (ref 19–32)
Calcium: 10.3 mg/dL (ref 8.4–10.5)
Creatinine, Ser: 0.67 mg/dL (ref 0.50–1.10)
GFR calc Af Amer: >90 mL/min (ref >90)

## 2011-08-29 LAB — SURGICAL PCR SCREEN
MRSA, PCR: NEGATIVE
Staphylococcus aureus: POSITIVE — AB

## 2011-08-29 NOTE — Pre-Procedure Instructions (Addendum)
20 Martha Pham  08/29/2011   Your procedure is scheduled on:  2.11.13  Report to [facility] at 1245 AM.  Call this number if you have problems the morning of surgery: [phone]   Remember:   Do not eat food:After Midnight.  May have clear liquids: up to 4 Hours before arrival.  Clear liquids include soda, tea, black coffee, apple or grape juice, broth.  Take these medicines the morning of surgery with A SIP OF WATER:micardis,   Do not wear jewelry, make-up or nail polish.  Do not wear lotions, powders, or perfumes. You may wear deodorant.  Do not shave 48 hours prior to surgery.  Do not bring valuables to the hospital.  Contacts, dentures or bridgework may not be worn into surgery.  Leave suitcase in the car. After surgery it may be brought to your room.  For patients admitted to the hospital, checkout time is 11:00 AM the day of discharge.   Patients discharged the day of surgery will not be allowed to drive home  Name and phone number of your driver: Glee Arvin [phone]  Special Instructions: CHG Shower Use Special Wash: 1/2 bottle night before surgery and 1/2 bottle morning of surgery.   Please read over the following fact sheets that you were given: Pain Booklet, Coughing and Deep Breathing, MRSA Information and Surgical Site Infection Prevention

## 2011-08-29 NOTE — Progress Notes (Addendum)
EKG CXR IN EPIC FROM 12.12 REQ SLEEP STUDY FROM ?[facility] 2010

## 2011-08-31 MED ORDER — HEPARIN SODIUM (PORCINE) 5000 UNIT/ML IJ SOLN
5000.0000 [IU] | Freq: Once | INTRAMUSCULAR | Status: AC
  Administered 2011-09-01: 5000 [IU] via SUBCUTANEOUS
  Filled 2011-08-31: qty 1

## 2011-08-31 MED ORDER — CEFAZOLIN SODIUM 1-5 GM-% IV SOLN
1.0000 g | INTRAVENOUS | Status: AC
  Administered 2011-09-01: 1 g via INTRAVENOUS
  Filled 2011-08-31: qty 50

## 2011-09-01 ENCOUNTER — Ambulatory Visit (HOSPITAL_COMMUNITY): Payer: BC Managed Care – PPO | Admitting: Anesthesiology

## 2011-09-01 ENCOUNTER — Encounter (HOSPITAL_COMMUNITY)
Admission: RE | Disposition: A | Discharge status: Home / Self Care | Payer: Self-pay | Source: Ambulatory Visit | Attending: General Surgery

## 2011-09-01 ENCOUNTER — Ambulatory Visit (HOSPITAL_COMMUNITY): Payer: BC Managed Care – PPO

## 2011-09-01 ENCOUNTER — Observation Stay (HOSPITAL_COMMUNITY)
Admission: RE | Admit: 2011-09-01 | Discharge: 2011-09-03 | Disposition: A | Discharge status: Home / Self Care | Payer: BC Managed Care – PPO | Source: Ambulatory Visit | Attending: General Surgery | Admitting: General Surgery

## 2011-09-01 ENCOUNTER — Other Ambulatory Visit (INDEPENDENT_AMBULATORY_CARE_PROVIDER_SITE_OTHER): Payer: Self-pay | Admitting: General Surgery

## 2011-09-01 ENCOUNTER — Encounter (HOSPITAL_COMMUNITY): Payer: Self-pay | Admitting: *Deleted

## 2011-09-01 ENCOUNTER — Encounter (HOSPITAL_COMMUNITY): Payer: Self-pay | Admitting: Anesthesiology

## 2011-09-01 DIAGNOSIS — G4730 Sleep apnea, unspecified: Secondary | ICD-10-CM | POA: Insufficient documentation

## 2011-09-01 DIAGNOSIS — Z01812 Encounter for preprocedural laboratory examination: Secondary | ICD-10-CM | POA: Insufficient documentation

## 2011-09-01 DIAGNOSIS — I10 Essential (primary) hypertension: Secondary | ICD-10-CM | POA: Insufficient documentation

## 2011-09-01 DIAGNOSIS — C50919 Malignant neoplasm of unspecified site of unspecified female breast: Principal | ICD-10-CM | POA: Insufficient documentation

## 2011-09-01 DIAGNOSIS — C50912 Malignant neoplasm of unspecified site of left female breast: Secondary | ICD-10-CM

## 2011-09-01 HISTORY — PX: SIMPLE MASTECTOMY: SHX2409

## 2011-09-01 LAB — CBC
HCT: 35.4 % — ABNORMAL LOW (ref 36.0–46.0)
Hemoglobin: 11.8 g/dL — ABNORMAL LOW (ref 12.0–15.0)
MCH: 31.8 pg (ref 26.0–34.0)
MCHC: 33.3 g/dL (ref 30.0–36.0)
MCV: 95.4 fL (ref 78.0–100.0)
RBC: 3.71 MIL/uL — ABNORMAL LOW (ref 3.87–5.11)

## 2011-09-01 SURGERY — SIMPLE MASTECTOMY
Anesthesia: General | Site: Breast | Laterality: Left | Wound class: Clean

## 2011-09-01 MED ORDER — KCL IN DEXTROSE-NACL 20-5-0.45 MEQ/L-%-% IV SOLN
INTRAVENOUS | Status: DC
  Filled 2011-09-01 (×3): qty 1000

## 2011-09-01 MED ORDER — PHENYLEPHRINE HCL 10 MG/ML IJ SOLN
INTRAMUSCULAR | Status: DC | PRN
  Administered 2011-09-01 (×5): 80 ug via INTRAVENOUS

## 2011-09-01 MED ORDER — MORPHINE SULFATE 2 MG/ML IJ SOLN
INTRAMUSCULAR | Status: AC
  Administered 2011-09-01: 2 mg via INTRAMUSCULAR
  Filled 2011-09-01: qty 1

## 2011-09-01 MED ORDER — DEXAMETHASONE SODIUM PHOSPHATE 4 MG/ML IJ SOLN
INTRAMUSCULAR | Status: DC | PRN
  Administered 2011-09-01: 4 mg via INTRAVENOUS

## 2011-09-01 MED ORDER — MORPHINE SULFATE 2 MG/ML IJ SOLN: 2.0000 mg | INTRAMUSCULAR | Status: DC | PRN

## 2011-09-01 MED ORDER — MIDAZOLAM HCL 5 MG/5ML IJ SOLN
INTRAMUSCULAR | Status: DC | PRN
  Administered 2011-09-01: 2 mg via INTRAVENOUS

## 2011-09-01 MED ORDER — OLMESARTAN MEDOXOMIL 20 MG PO TABS
20.0000 mg | ORAL_TABLET | Freq: Every day | ORAL | Status: DC
  Administered 2011-09-02 – 2011-09-03 (×2): 20 mg via ORAL
  Filled 2011-09-01 (×2): qty 1

## 2011-09-01 MED ORDER — LACTATED RINGERS IV SOLN
INTRAVENOUS | Status: DC | PRN
  Administered 2011-09-01 (×2): via INTRAVENOUS

## 2011-09-01 MED ORDER — BUPIVACAINE HCL (PF) 0.25 % IJ SOLN
INTRAMUSCULAR | Status: DC | PRN
  Administered 2011-09-01: 40 mL

## 2011-09-01 MED ORDER — DROPERIDOL 2.5 MG/ML IJ SOLN: 0.6250 mg | INTRAMUSCULAR | Status: DC | PRN

## 2011-09-01 MED ORDER — HYDROCODONE-ACETAMINOPHEN 5-325 MG PO TABS
1.0000 | ORAL_TABLET | ORAL | Status: DC | PRN
  Administered 2011-09-01 – 2011-09-03 (×4): 1 via ORAL
  Filled 2011-09-01 (×4): qty 1

## 2011-09-01 MED ORDER — TELMISARTAN-HCTZ 40-12.5 MG PO TABS: 1.0000 | ORAL_TABLET | Freq: Every day | ORAL | Status: DC

## 2011-09-01 MED ORDER — ENOXAPARIN SODIUM 40 MG/0.4ML ~~LOC~~ SOLN
40.0000 mg | SUBCUTANEOUS | Status: DC
  Administered 2011-09-02 – 2011-09-03 (×2): 40 mg via SUBCUTANEOUS
  Filled 2011-09-01 (×3): qty 0.4

## 2011-09-01 MED ORDER — PROPOFOL 10 MG/ML IV EMUL
INTRAVENOUS | Status: DC | PRN
  Administered 2011-09-01: 200 mg via INTRAVENOUS

## 2011-09-01 MED ORDER — LIDOCAINE-EPINEPHRINE 1 %-1:100000 IJ SOLN
INTRAMUSCULAR | Status: DC | PRN
  Administered 2011-09-01: 40 mL

## 2011-09-01 MED ORDER — ONDANSETRON HCL 4 MG/2ML IJ SOLN
INTRAMUSCULAR | Status: DC | PRN
  Administered 2011-09-01: 4 mg via INTRAVENOUS

## 2011-09-01 MED ORDER — LACTATED RINGERS IV SOLN
INTRAVENOUS | Status: DC
  Administered 2011-09-01: 14:00:00 via INTRAVENOUS

## 2011-09-01 MED ORDER — HYDROMORPHONE HCL PF 1 MG/ML IJ SOLN: 0.2500 mg | INTRAMUSCULAR | Status: DC | PRN

## 2011-09-01 MED ORDER — DROPERIDOL 2.5 MG/ML IJ SOLN
INTRAMUSCULAR | Status: DC | PRN
  Administered 2011-09-01: 0.625 mg via INTRAVENOUS

## 2011-09-01 MED ORDER — HYDROCHLOROTHIAZIDE 12.5 MG PO CAPS
12.5000 mg | ORAL_CAPSULE | Freq: Every day | ORAL | Status: DC
  Administered 2011-09-02 – 2011-09-03 (×2): 12.5 mg via ORAL
  Filled 2011-09-01 (×2): qty 1

## 2011-09-01 MED ORDER — 0.9 % SODIUM CHLORIDE (POUR BTL) OPTIME
TOPICAL | Status: DC | PRN
  Administered 2011-09-01: 1000 mL

## 2011-09-01 MED ORDER — CEFAZOLIN SODIUM 1-5 GM-% IV SOLN
1.0000 g | Freq: Three times a day (TID) | INTRAVENOUS | Status: AC
  Administered 2011-09-01 – 2011-09-02 (×3): 1 g via INTRAVENOUS
  Filled 2011-09-01 (×5): qty 50

## 2011-09-01 MED ORDER — FENTANYL CITRATE 0.05 MG/ML IJ SOLN
INTRAMUSCULAR | Status: DC | PRN
  Administered 2011-09-01 (×3): 25 ug via INTRAVENOUS
  Administered 2011-09-01 (×2): 50 ug via INTRAVENOUS
  Administered 2011-09-01: 25 ug via INTRAVENOUS
  Administered 2011-09-01: 50 ug via INTRAVENOUS
  Administered 2011-09-01: 25 ug via INTRAVENOUS
  Administered 2011-09-01: 100 ug via INTRAVENOUS

## 2011-09-01 SURGICAL SUPPLY — 45 items
ADH SKN CLS APL DERMABOND .7 (GAUZE/BANDAGES/DRESSINGS) ×1
APL SKNCLS STERI-STRIP NONHPOA (GAUZE/BANDAGES/DRESSINGS) ×1
APPLIER CLIP 9.375 MED OPEN (MISCELLANEOUS) ×2
APR CLP MED 9.3 20 MLT OPN (MISCELLANEOUS) ×1
BENZOIN TINCTURE PRP APPL 2/3 (GAUZE/BANDAGES/DRESSINGS) ×1 IMPLANT
BINDER BREAST LRG (GAUZE/BANDAGES/DRESSINGS) IMPLANT
BINDER BREAST XLRG (GAUZE/BANDAGES/DRESSINGS) ×2 IMPLANT
CANISTER SUCTION 2500CC (MISCELLANEOUS) ×3 IMPLANT
CHLORAPREP W/TINT 26ML (MISCELLANEOUS) ×2 IMPLANT
CLIP APPLIE 9.375 MED OPEN (MISCELLANEOUS) IMPLANT
CLOSURE STERI STRIP 1/2 X4 (GAUZE/BANDAGES/DRESSINGS) ×1 IMPLANT
CLOTH BEACON ORANGE TIMEOUT ST (SAFETY) ×2 IMPLANT
COVER SURGICAL LIGHT HANDLE (MISCELLANEOUS) ×2 IMPLANT
DERMABOND ADVANCED (GAUZE/BANDAGES/DRESSINGS) ×1
DERMABOND ADVANCED .7 DNX12 (GAUZE/BANDAGES/DRESSINGS) ×1 IMPLANT
DRAIN CHANNEL 19F RND (DRAIN) ×3 IMPLANT
DRAPE LAPAROSCOPIC ABDOMINAL (DRAPES) ×2 IMPLANT
ELECT BLADE 4.0 EZ CLEAN MEGAD (MISCELLANEOUS) ×2
ELECT CAUTERY BLADE 6.4 (BLADE) ×2 IMPLANT
ELECT REM PT RETURN 9FT ADLT (ELECTROSURGICAL) ×2
ELECTRODE BLDE 4.0 EZ CLN MEGD (MISCELLANEOUS) ×1 IMPLANT
ELECTRODE REM PT RTRN 9FT ADLT (ELECTROSURGICAL) ×1 IMPLANT
EVACUATOR SILICONE 100CC (DRAIN) ×3 IMPLANT
GLOVE BIOGEL PI IND STRL 7.0 (GLOVE) IMPLANT
GLOVE BIOGEL PI INDICATOR 7.0 (GLOVE) ×1
GLOVE SURG SS PI 6.5 STRL IVOR (GLOVE) ×1 IMPLANT
GLOVE SURG SS PI 7.5 STRL IVOR (GLOVE) ×4 IMPLANT
GOWN PREVENTION PLUS XLARGE (GOWN DISPOSABLE) ×2 IMPLANT
GOWN STRL NON-REIN LRG LVL3 (GOWN DISPOSABLE) ×4 IMPLANT
KIT BASIN OR (CUSTOM PROCEDURE TRAY) ×2 IMPLANT
KIT ROOM TURNOVER OR (KITS) ×2 IMPLANT
NS IRRIG 1000ML POUR BTL (IV SOLUTION) ×4 IMPLANT
PACK GENERAL/GYN (CUSTOM PROCEDURE TRAY) ×2 IMPLANT
PAD ARMBOARD 7.5X6 YLW CONV (MISCELLANEOUS) ×2 IMPLANT
SPECIMEN JAR X LARGE (MISCELLANEOUS) ×2 IMPLANT
SPONGE GAUZE 4X4 FOR O.R. (GAUZE/BANDAGES/DRESSINGS) ×1 IMPLANT
STAPLER VISISTAT 35W (STAPLE) ×1 IMPLANT
SUT ETHILON 3 0 FSL (SUTURE) ×3 IMPLANT
SUT MNCRL AB 4-0 PS2 18 (SUTURE) ×3 IMPLANT
SUT SILK 2 0 FS (SUTURE) ×2 IMPLANT
SUT VIC AB 3-0 SH 18 (SUTURE) ×2 IMPLANT
SUT VIC AB 3-0 SH 8-18 (SUTURE) ×2 IMPLANT
SYR CONTROL 10ML LL (SYRINGE) ×2 IMPLANT
TOWEL OR 17X24 6PK STRL BLUE (TOWEL DISPOSABLE) ×2 IMPLANT
TOWEL OR 17X26 10 PK STRL BLUE (TOWEL DISPOSABLE) ×2 IMPLANT

## 2011-09-01 NOTE — Op Note (Signed)
Martha Pham, Martha Pham                ACCOUNT NO.:  [id number]  MEDICAL RECORD NO.:  [id number]  LOCATION:  5124                         FACILITY:  [facility]  PHYSICIAN:  Lodema Pilot, MD       DATE OF BIRTH:  [birth year]  DATE OF PROCEDURE:  09/01/2011 DATE OF DISCHARGE:                              OPERATIVE REPORT   PROCEDURE:  Left simple mastectomy.  PREOPERATIVE DIAGNOSIS:  Left breast cancer.  POSTOP DIAGNOSIS:  Left breast cancer.  SURGEON:  Lodema Pilot, MD  ASSISTANT:  None.  ANESTHESIA:  Genera LMA anesthesia with 40 mL of 1% lidocaine with epinephrine, 0.25% Marcaine in a 50:50 mixture.  FLUIDS:  1700 mL crystalloid.  ESTIMATED BLOOD LOSS:  200 mL.  DRAINS: 1. A 19-French Blake drain, which is the medial drain placed under the     lower flap of the mastectomy. 2. Lateral drain is 19-French Blake drain placed in the upper flap.  SPECIMENS: 1. Mastectomy specimen with a stitch marking the lateral skin margin     specimen. 2. Lumpectomy scar.  COMPLICATIONS:  None apparent.  FINDINGS:  Simple mastectomy with excision of prior lumpectomy cavity.  INDICATION FOR PROCEDURE:  Martha Pham is a 61 year old female with triple negative left breast cancer who has had attempt at left breast conservation.  She has already had a negative sentinel node.  We did re- excision of her left breast margins, and new foci of DCIS were found and given a triple negative nature and that these new foci of DCIS were not imaged.  We felt mastectomy would be the best treatment for her.  OPERATIVE DETAILS:  Martha Pham was seen and evaluated in the preoperative area, and risks and benefits of procedure were again discussed in lay terms, and informed consent was obtained.  Prophylactic antibiotics were given, and she was taken to the operating room, placed on table in supine position.  General LMA anesthesia was obtained under left breast, and chest was prepped and draped in standard surgical  fashion.  An elliptical incision was made in the skin around her nipple and dissection carried down into the breast tissue using Bovie electrocautery.  The skin and dermis were elevated, and breast flaps were elevated circumferentially attempting to create 7 mm left breast flaps in all directions.  I proceeded with the dissection cephalad first and the prior lumpectomy specimen was excised and dissection was taken medially to the sternum, inferiorly to the rectus muscle, and laterally to the latissimus dorsi and after the flaps were created circumferentially, I elevated the breast from medial collateral fashion off the pectoralis muscle.  The fascia was taken with the breast leaving the muscle.  Laterally, the breast was then amputated laterally, and the suture was placed at the lateral skin margins, and the specimen was sent to Pathology for permanent section.  Additionally, she appeared to have adequate amount of tissue for closure.  Additional skin was taken from the superior flap to remove the prior lumpectomy scar, and some additional skin was taken from the lateral and medial aspects to minimize dog-ear formation on the skin.  The wound was irrigated with several liters of irrigation, and the flaps  were inspected where I felt that the flaps were thicker than desired.  A curved Mayo scissors was used to trim up the additional flaps.  The wound was inspected for hemostasis, which was obtained with Bovie electrocautery, and a few hemoclips and then the wound was irrigated again, and the wound was inspected for hemostasis again, which was noted to be adequate, and two 19-French Blake drains were placed through the lateral aspect of the wound.  The medial drain was coursing caudad along the inferior flap, and the lateral drain is pulled up under the superior flap, and multiple interrupted 3-0 Vicryl sutures were used to approximate the dermis, and the skin edges were approximated with  4-0 Monocryl subcuticular suture. The skin was washed and dried.  Benzoin and Steri-Strips were applied, and a sterile dressing was applied.  40 mL of 1% lidocaine with epinephrine and 0.25% Marcaine in a 50:50 mixture was infiltrated into the mastectomy cavity through one of the drains, and the drains were left off suction until the patient was transferred to the surgical ward. All sponge and instrument counts were correct at the end of the case, and she was stable and ready for transfer to the PACU in stable condition.          ______________________________ Lodema Pilot, MD     BL/MEDQ  D:  09/01/2011  T:  09/01/2011  Job:  [id number]

## 2011-09-01 NOTE — Brief Op Note (Signed)
09/01/2011  5:34 PM  PATIENT:  Martha Pham  61 y.o. female  PRE-OPERATIVE DIAGNOSIS:  breast cancer  POST-OPERATIVE DIAGNOSIS:  same  PROCEDURE:  Procedure(s) (LRB): SIMPLE MASTECTOMY (Left)  SURGEON:  Surgeon(s) and Role:    * Rulon Abide, DO - Primary  PHYSICIAN ASSISTANT:   ASSISTANTS: none   ANESTHESIA:   general  EBL:  Total I/O In: 1700 [I.V.:1700] Out: 200 [Blood:200]  BLOOD ADMINISTERED:none  DRAINS: (2) Jackson-Pratt drain(s) with closed bulb suction in the mastectomy cavity   LOCAL MEDICATIONS USED:  MARCAINE    and LIDOCAINE   SPECIMEN:  Source of Specimen:  left mastectomy, suture at lateral skin margin: lumpectomy incision  DISPOSITION OF SPECIMEN:  PATHOLOGY  COUNTS:  YES  TOURNIQUET:  * No tourniquets in log *  DICTATION: .Other Dictation: Dictation Number [id number]  PLAN OF CARE: Admit to inpatient   PATIENT DISPOSITION:  PACU - hemodynamically stable.   Delay start of Pharmacological VTE agent (>24hrs) due to surgical blood loss or risk of bleeding: no

## 2011-09-01 NOTE — H&P (Signed)
HPI: this patient is seen today in the multi-disciplinary breast clinic for evaluation of a newly diagnosed left breast cancer found on annual screening mammogram. She was noted to have some calcifications and was called back for biopsy which was consistent with invasive ductal carcinoma. She states that she does hurt self breast exams and has not noticed any suspicious masses or changes even after the biopsy was performed. She has a sister with a history of breast cancer at age 2 but no other family history of breast cancer. She denies any systemic symptoms such as headaches, bony pains, or weight loss. On followup MRI she was noted to have a 2.7 cm x 2.3 cm x 1.0 cm area of enhancement in the area of the known tumor.   She has undergone attempts at breast conservation with subsequent reexcision.  Sentinel nodes were negative.  Still with residual DCIS.  No past medical history on file.  HTN, HLD  No past surgical history on file.  Family History   Problem  Relation  Age of Onset   .  Breast cancer  Sister  48   Social History: reports that she has never smoked. She does not have any smokeless tobacco history on file. She reports that she drinks alcohol. Her drug history not on file.  Allergies:  Allergies   Allergen  Reactions   .  Dust Mite Extract  Itching   .  Other      grass   All other review of systems negative or noncontributory except as stated in the HPI  No current outpatient prescriptions on file as of 06/11/2011.    No current facility-administered medications on file as of 06/11/2011.   No results found.  There were no vitals taken for this visit.  General appearance: alert, cooperative and no distress  Head: Normocephalic, without obvious abnormality, atraumatic  Eyes: conjunctivae/corneas clear. PERRL, EOM's intact. Fundi benign.  Neck: no adenopathy and supple, symmetrical, trachea midline  Resp: clear to auscultation bilaterally  Chest wall: no tenderness  Breasts:  left upper inner quadrant lumpectomy scar. Cardio: regular rate and rhythm, S1, S2 normal, no murmur, click, rub or gallop  GI: soft, non-tender; bowel sounds normal; no masses, no organomegaly  Extremities: extremities normal, left toe tender and bruised, no cyanosis or edema  Pulses: 2+ and symmetric  Skin: Skin color, texture, turgor normal. No rashes or lesions  Neurologic: Grossly normal  Assessment/Plan  Left breast cancer with positive margins.   We have attempted breast conservation but the concern is that she has residual DCIS which is not visible with imaging.  She is now interested in left mastectomy for treatment.  She will see the plastic surgeon for possible delayed reconstruction options when she has recovered.  We discussed the risks of infection, bleeding, pain, scarring, recurrence, need for radiation and/or chemo, and poor cosmesis and she expressed understanding and desires to proceed with left mastectomy.

## 2011-09-01 NOTE — Anesthesia Preprocedure Evaluation (Addendum)
Anesthesia Evaluation  Patient identified by MRN, date of birth, ID band Patient awake    Reviewed: Allergy & Precautions, H&P , NPO status , Patient's Chart, lab work & pertinent test results, reviewed documented beta blocker date and time   History of Anesthesia Complications Negative for: history of anesthetic complications  Airway Mallampati: I TM Distance: >3 FB Neck ROM: Full    Dental  (+) Edentulous Upper, Teeth Intact and Dental Advisory Given   Pulmonary sleep apnea (not compliant with mask) ,  clear to auscultation  Pulmonary exam normal       Cardiovascular hypertension, Pt. on medications Regular Normal- Systolic murmurs    Neuro/Psych Negative Neurological ROS  Negative Psych ROS   GI/Hepatic negative GI ROS, Neg liver ROS,   Endo/Other  Negative Endocrine ROS  Renal/GU negative Renal ROS  Genitourinary negative   Musculoskeletal negative musculoskeletal ROS (+)   Abdominal (+) obese,   Peds  Hematology   Anesthesia Other Findings   Reproductive/Obstetrics negative OB ROS                          Anesthesia Physical Anesthesia Plan  ASA: II  Anesthesia Plan: General   Post-op Pain Management:    Induction: Intravenous  Airway Management Planned: LMA  Additional Equipment:   Intra-op Plan:   Post-operative Plan: Extubation in OR  Informed Consent: I have reviewed the patients History and Physical, chart, labs and discussed the procedure including the risks, benefits and alternatives for the proposed anesthesia with the patient or authorized representative who has indicated his/her understanding and acceptance.   Dental advisory given  Plan Discussed with: CRNA, Anesthesiologist and Surgeon  Anesthesia Plan Comments:         Anesthesia Quick Evaluation

## 2011-09-01 NOTE — Anesthesia Procedure Notes (Signed)
Procedure Name: LMA Insertion Date/Time: 09/01/2011 2:55 PM Performed by: Leona Singleton A. Patient Re-evaluated:Patient Re-evaluated prior to inductionOxygen Delivery Method: Circle System Utilized Preoxygenation: Pre-oxygenation with 100% oxygen Intubation Type: IV induction LMA: LMA inserted LMA Size: 4.0 Tube type: Oral Number of attempts: 1 Placement Confirmation: positive ETCO2 and breath sounds checked- equal and bilateral Tube secured with: Tape Dental Injury: Teeth and Oropharynx as per pre-operative assessment

## 2011-09-01 NOTE — Anesthesia Postprocedure Evaluation (Signed)
  Anesthesia Post-op Note  Patient: Martha Pham  Procedure(s) Performed: Procedure(s) (LRB): SIMPLE MASTECTOMY (Left)  Patient Location: PACU  Anesthesia Type: General  Level of Consciousness: awake, oriented, sedated and patient cooperative  Airway and Oxygen Therapy: Patient Spontanous Breathing and Patient connected to nasal cannula oxygen  Post-op Pain: mild  Post-op Assessment: Post-op Vital signs reviewed, Patient's Cardiovascular Status Stable, Respiratory Function Stable, Patent Airway, No signs of Nausea or vomiting and Pain level controlled  Post-op Vital Signs: stable  Complications: No apparent anesthesia complications

## 2011-09-01 NOTE — Preoperative (Signed)
Beta Blockers   Reason not to administer Beta Blockers:Not Applicable 

## 2011-09-01 NOTE — Transfer of Care (Signed)
Immediate Anesthesia Transfer of Care Note  Patient: Martha Pham  Procedure(s) Performed: Procedure(s) (LRB): SIMPLE MASTECTOMY (Left)  Patient Location: PACU  Anesthesia Type: General  Level of Consciousness: sedated and patient cooperative  Airway & Oxygen Therapy: Patient Spontanous Breathing and Patient connected to nasal cannula oxygen  Post-op Assessment: Report given to PACU RN, Post -op Vital signs reviewed and stable and Patient moving all extremities X 4  Post vital signs: Reviewed and stable  Complications: No apparent anesthesia complications

## 2011-09-02 LAB — CBC
HCT: 30.6 % — ABNORMAL LOW (ref 36.0–46.0)
MCH: 32.1 pg (ref 26.0–34.0)
MCHC: 33.3 g/dL (ref 30.0–36.0)
MCV: 96.2 fL (ref 78.0–100.0)
Platelets: 159 10*3/uL (ref 150–400)
RDW: 11.8 % (ref 11.5–15.5)

## 2011-09-02 MED ORDER — MUPIROCIN 2 % EX OINT
1.0000 "application " | TOPICAL_OINTMENT | Freq: Two times a day (BID) | CUTANEOUS | Status: DC
  Administered 2011-09-02 – 2011-09-03 (×3): 1 via NASAL
  Filled 2011-09-02: qty 22

## 2011-09-02 MED ORDER — CHLORHEXIDINE GLUCONATE CLOTH 2 % EX PADS
6.0000 | MEDICATED_PAD | Freq: Every day | CUTANEOUS | Status: DC
  Administered 2011-09-02 – 2011-09-03 (×2): 6 via TOPICAL

## 2011-09-02 MED FILL — KCl 20 MEQ/L (0.15%) in Dextrose 5% & NaCl 0.45% Inj: INTRAVENOUS | Qty: 1000 | Status: AC

## 2011-09-02 NOTE — Progress Notes (Signed)
Orthopedic Tech Progress Note Patient Details:  Martha Pham [birth year] [id number]  Other Ortho Devices Type of Ortho Device: Postop boot Ortho Device Location: left foot Ortho Device Interventions: Application   [name] 09/02/2011, 11:20 AM

## 2011-09-02 NOTE — Progress Notes (Signed)
PT Cancellation Note  Treatment cancelled today due to patient has bedrest order awaiting ortho consult for left foot fx. Please advance activity orders as appropriate and PT will follow up at that time.  Thanks!  Romeo Rabon 09/02/2011, 3:02 PM

## 2011-09-02 NOTE — Progress Notes (Signed)
Doing well.  No issues.  Left foot in boot per ortho.  If she continues to improve, she should be okay for discharge tomorrow.

## 2011-09-02 NOTE — Consult Note (Signed)
Reason for Consult: great toe fracture Phone consult care discussed with Dr Joesphine Bare is an 61 y.o. female.  HPI: s/p breast cancer surgery with onset of left toe pain prior to  Past Medical History  Diagnosis Date  . Hypertension   . Hyperlipemia   . Sleep apnea     SLEEP STUDY DX 10,CPAP BUT DON'TUSE  . Cancer     lt. breast ca-snbx  . Arthritis     Past Surgical History  Procedure Date  . Tonsillectomy   . Mouth surgery     TOOTH IMPLANT BOTTOM X2 LFT  . Portacath placement 06/24/2011    Procedure: INSERTION PORT-A-CATH;  Surgeon: Rulon Abide, DO;  Location: [facility];  Service: General;  Laterality: Right;  Right mediport placement  . Breast surgery 12/12, 1/17    lt lumpectomy-snbx-pac    Family History  Problem Relation Age of Onset  . Breast cancer Sister 34    Social History:  reports that she has never smoked. She has never used smokeless tobacco. She reports that she drinks about 1.2 ounces of alcohol per week. She reports that she does not use illicit drugs.  Allergies:  Allergies  Allergen Reactions  . Dust Mite Extract Itching  . Other     grass    Medications: I have reviewed the patient's current medications.  Results for orders placed during the hospital encounter of 09/01/11 (from the past 48 hour(s))  CBC     Status: Abnormal   Collection Time   09/01/11  7:51 PM      Component Value Range Comment   WBC 7.5  4.0 - 10.5 (K/uL)    RBC 3.71 (*) 3.87 - 5.11 (MIL/uL)    Hemoglobin 11.8 (*) 12.0 - 15.0 (g/dL)    HCT 91.4 (*) 78.2 - 46.0 (%)    MCV 95.4  78.0 - 100.0 (fL)    MCH 31.8  26.0 - 34.0 (pg)    MCHC 33.3  30.0 - 36.0 (g/dL)    RDW 95.6  21.3 - 08.6 (%)    Platelets 170  150 - 400 (K/uL)   CBC     Status: Abnormal   Collection Time   09/02/11  5:15 AM      Component Value Range Comment   WBC 8.9  4.0 - 10.5 (K/uL)    RBC 3.18 (*) 3.87 - 5.11 (MIL/uL)    Hemoglobin 10.2 (*) 12.0 - 15.0 (g/dL)    HCT  57.8 (*) 46.9 - 46.0 (%)    MCV 96.2  78.0 - 100.0 (fL)    MCH 32.1  26.0 - 34.0 (pg)    MCHC 33.3  30.0 - 36.0 (g/dL)    RDW 62.9  52.8 - 41.3 (%)    Platelets 159  150 - 400 (K/uL)     Dg Foot Complete Left  09/01/2011  *RADIOLOGY REPORT*  Clinical Data: Left toe pain.  Bruising in the great toe.  LEFT FOOT - COMPLETE 3+ VIEW  Comparison: None.  Findings: An oblique fracture is present in the proximal phalanx of the left great toe.  There is soft tissue swelling over the dorsal aspect of the metatarsals.  No additional fracture is present.  The joints are intact.  IMPRESSION: Oblique fracture within the proximal phalanx of the great toe is minimally-displaced.  Original Report Authenticated By: Jamesetta Orleans. MATTERN, M.D.    ROS Blood pressure 107/53, pulse 81, temperature 98.6 F (37 C), temperature  source Oral, resp. rate 20, height 4\' 11"  (1.499 m), weight 73.4 kg (161 lb 13.1 oz), SpO2 100.00%. Physical Exam  Assessment/Plan: Left great toe fracture ACE to foot with hard sole shoe, WBAT Follow up with Beane in 2 weeks for repeat xrays Elevate and ice as needed  Xara Paulding R. 09/02/2011, 10:56 AM

## 2011-09-02 NOTE — Discharge Instructions (Signed)
You have a left great toe fracture.  You can weight bear as tolerated using hard sole shoe.  Use ACE wrap for swelling do not put on too tight.  Elevate and ice to the foot as needed.  Will take 6-8 weeks for this to heal completely.  Repeat X-rays in 2 weeks to check fracture.

## 2011-09-02 NOTE — Progress Notes (Signed)
UR of chart completed.  

## 2011-09-02 NOTE — Progress Notes (Signed)
Orthopedic Tech Progress Note Patient Details:  Martha Pham 07-Jan-1951 [id number]  Other Ortho Devices Type of Ortho Device: Postop boot Ortho Device Location: left foot Ortho Device Interventions: Application Viewed order from rn order list  [name] 09/02/2011, 11:20 AM

## 2011-09-02 NOTE — Progress Notes (Signed)
1 Day Post-Op  Subjective: No issues overnight.   Objective: Vital signs in last 24 hours: Temp:  [97 F (36.1 C)-99.7 F (37.6 C)] 99.5 F (37.5 C) (02/12 0500) Pulse Rate:  [58-89] 70  (02/12 0500) Resp:  [15-20] 18  (02/12 0500) BP: (96-146)/(49-87) 101/49 mmHg (02/12 0500) SpO2:  [96 %-100 %] 100 % (02/12 0500) Weight:  [161 lb 13.1 oz (73.4 kg)] 161 lb 13.1 oz (73.4 kg) (02/11 2013)    Intake/Output from previous day: 02/11 0701 - 02/12 0700 In: 2912 [P.O.:120; I.V.:2792] Out: 1445 [Urine:1150; Drains:95; Blood:200] Intake/Output this shift:    General appearance: alert, cooperative and no distress Resp: nonlabored Chest wall: left mastectomy flaps viable.  no sign of infection.  JP output appropriate. GI: soft, non-tender; bowel sounds normal; no masses,  no organomegaly  Lab Results:   Basename 09/02/11 0515 09/01/11 1951  WBC 8.9 7.5  HGB 10.2* 11.8*  HCT 30.6* 35.4*  PLT 159 170   BMET No results found for this basename: NA:2,K:2,CL:2,CO2:2,GLUCOSE:2,BUN:2,CREATININE:2,CALCIUM:2 in the last 72 hours PT/INR No results found for this basename: LABPROT:2,INR:2 in the last 72 hours ABG No results found for this basename: PHART:2,PCO2:2,PO2:2,HCO3:2 in the last 72 hours  Studies/Results: Dg Foot Complete Left  09/01/2011  *RADIOLOGY REPORT*  Clinical Data: Left toe pain.  Bruising in the great toe.  LEFT FOOT - COMPLETE 3+ VIEW  Comparison: None.  Findings: An oblique fracture is present in the proximal phalanx of the left great toe.  There is soft tissue swelling over the dorsal aspect of the metatarsals.  No additional fracture is present.  The joints are intact.  IMPRESSION: Oblique fracture within the proximal phalanx of the great toe is minimally-displaced.  Original Report Authenticated By: Jamesetta Orleans. MATTERN, M.D.    Anti-infectives: Anti-infectives     Start     Dose/Rate Route Frequency Ordered Stop   09/01/11 2200   ceFAZolin (ANCEF) IVPB 1 g/50 mL  premix        1 g 100 mL/hr over 30 Minutes Intravenous 3 times per day 09/01/11 1937 09/02/11 2159   08/31/11 1515   ceFAZolin (ANCEF) IVPB 1 g/50 mL premix        1 g 100 mL/hr over 30 Minutes Intravenous 60 min pre-op 08/31/11 1505 09/01/11 1448          Assessment/Plan: s/p Procedure(s) (LRB): SIMPLE MASTECTOMY (Left) Advance diet consult ortho for left toe fx.  work on pain control and drain teaching.  she should be okay for discharge tomorrow or later today.  LOS: 1 day    Lodema Pilot DAVID 09/02/2011

## 2011-09-03 MED ORDER — HYDROCODONE-ACETAMINOPHEN 5-325 MG PO TABS: 1.0000 | ORAL_TABLET | ORAL | Status: AC | PRN

## 2011-09-03 NOTE — Evaluation (Signed)
Physical Therapy Evaluation Patient Details Name: Martha Pham MRN: [id number] DOB: 1950/09/01 Today's Date: 09/03/2011  Problem List:  Patient Active Problem List  Diagnoses  . Cancer of upper-inner quadrant of female breast    Past Medical History:  Past Medical History  Diagnosis Date  . Hypertension   . Hyperlipemia   . Sleep apnea     SLEEP STUDY DX 10,CPAP BUT DON'TUSE  . Cancer     lt. breast ca-snbx  . Arthritis    Past Surgical History:  Past Surgical History  Procedure Date  . Tonsillectomy   . Mouth surgery     TOOTH IMPLANT BOTTOM X2 LFT  . Portacath placement 06/24/2011    Procedure: INSERTION PORT-A-CATH;  Surgeon: Rulon Abide, DO;  Location: [facility];  Service: General;  Laterality: Right;  Right mediport placement  . Breast surgery 12/12, 1/17    lt lumpectomy-snbx-pac    PT Assessment/Plan/Recommendation PT Assessment Clinical Impression Statement: Pt adm for mastectomy and found to have lt great toe fx that occurred with fall on steps at home the night before surgery.  Pt doing well with mobility and ready to go home from PT standpoint. PT Recommendation/Assessment: Patent does not need any further PT services No Skilled PT: Patient will have necessary level of assist by caregiver at discharge;Patient is modified independent with all activity/mobility PT Recommendation Follow Up Recommendations: No PT follow up Equipment Recommended: None recommended by PT PT Goals     PT Evaluation Precautions/Restrictions    Prior Functioning  Home Living Lives With: Alone Type of Home: House Home Layout: Two level Alternate Level Stairs-Rails: Right Alternate Level Stairs-Number of Steps: 6 x 2 Home Adaptive Equipment: None Prior Function Level of Independence: Independent with basic ADLs;Independent with transfers;Independent with homemaking with ambulation;Independent with gait Driving:  Yes Cognition Cognition Arousal/Alertness: Awake/alert Overall Cognitive Status: Appears within functional limits for tasks assessed Orientation Level: Oriented X4 Sensation/Coordination   Extremity Assessment RLE Assessment RLE Assessment: Within Functional Limits LLE Assessment LLE Assessment: Within Functional Limits Mobility (including Balance) Transfers Transfers: Yes Sit to Stand: 6: Modified independent (Device/Increase time);With upper extremity assist;With armrests Stand to Sit: 6: Modified independent (Device/Increase time);With upper extremity assist;With armrests Ambulation/Gait Ambulation/Gait: Yes Ambulation/Gait Assistance: 6: Modified independent (Device/Increase time) Ambulation Distance (Feet): 200 Feet Assistive device: None Gait Pattern: Decreased stance time - left Gait velocity: slow pace Stairs Assistance: 5: Supervision Stairs Assistance Details (indicate cue type and reason): cues for sequence  Stair Management Technique: One rail Right;Alternating pattern;Step to pattern;Forwards Number of Stairs: 10   Dynamic Standing Balance Dynamic Standing - Balance Support: No upper extremity supported;During functional activity (pulling up clothing) Dynamic Standing - Level of Assistance: 6: Modified independent (Device/Increase time) Exercise    End of Session PT - End of Session Equipment Utilized During Treatment: Other (comment) (wooden shoe on LLE) Activity Tolerance: Patient tolerated treatment well Patient left: in chair;with call bell in reach;with family/visitor present Nurse Communication: Mobility status for ambulation  Antwuan Eckley 09/03/2011, 10:47 AM  [facility] PT [phone]

## 2011-09-03 NOTE — Progress Notes (Signed)
Discharge instructions reviewed with pt and prescription given.  Pt stated she had been instructed on how to empty, measure and strip drains.  Instructions reinforced and questions answered.  Pt verbalized understanding.  Pt discharged in stable condition via wheelchair with family.  Martha Pham [place]

## 2011-09-03 NOTE — Discharge Summary (Signed)
  Physician Discharge Summary  Patient ID: Martha Pham MRN: [id number] DOB/AGE: 63-Mar-1952 63 y.o.  Admit date: 09/01/2011 Discharge date: 09/03/2011  Admission Diagnoses: left breast cancer  Discharge Diagnoses: same Active Problems:  * No active hospital problems. *    Discharged Condition: good  Hospital Course: to OR for left mastectomy 09/01/11.  Admitted for drain care and pain control.  She dis well but stayed 2 days for pain control.  On POD 2, she was feeling well, afebrile and wounds looked good and stable for discharge to home. She was dx with left toe fracture and ortho consulted for management.  Consults: orthopedic surgery  Significant Diagnostic Studies: left foot xray  Treatments: surgery: left mastectomy 09/01/11  Disposition: Home or Self Care   Medication List  As of 09/03/2011  8:23 AM   ASK your doctor about these medications         atorvastatin 10 MG tablet   Commonly known as: LIPITOR   Take 10 mg by mouth daily.      calcium carbonate 600 MG Tabs   Commonly known as: OS-CAL   Take 600 mg by mouth 2 (two) times daily with a meal.      HYDROcodone-acetaminophen 5-500 MG per tablet   Commonly known as: VICODIN   Take 1 tablet by mouth every 6 (six) hours as needed. For pain      HYDROcodone-acetaminophen 5-500 MG per tablet   Commonly known as: VICODIN   Take 1 tablet by mouth every 4 (four) hours as needed for pain.      MICARDIS HCT 40-12.5 MG per tablet   Generic drug: telmisartan-hydrochlorothiazide   Take 1 tablet by mouth Daily.      multivitamin capsule   Take 1 capsule by mouth daily.      REFRESH OP   Apply 1 drop to eye 2 (two) times daily as needed. For dry eyes           Follow-up Information    Follow up with BEANE,JEFFREY C, MD in 2 weeks.   Contact information:   [facility] [address], [address] [place] [address] [phone]          Signed: Lodema Pilot  DAVID 09/03/2011, 8:23 AM

## 2011-09-03 NOTE — Progress Notes (Signed)
2 Days Post-Op  Subjective: Pain improved today.  No complaints.  Objective: Vital signs in last 24 hours: Temp:  [98.6 F (37 C)-99.4 F (37.4 C)] 99.4 F (37.4 C) (02/13 0641) Pulse Rate:  [69-81] 69  (02/13 0641) Resp:  [18-20] 18  (02/13 0641) BP: (90-115)/(46-60) 94/58 mmHg (02/13 0641) SpO2:  [97 %-100 %] 98 % (02/13 0641) Last BM Date: 09/01/11  Intake/Output from previous day: 02/12 0701 - 02/13 0700 In: 840 [P.O.:840] Out: 1985 [Urine:1900; Drains:85] Intake/Output this shift:    General appearance: alert, cooperative and no distress Resp: nonlabored Breasts: incision without infection, flaps viable, drains appropriate Cardio: normal rate, regular rhythm  Lab Results:   Basename 09/02/11 0515 09/01/11 1951  WBC 8.9 7.5  HGB 10.2* 11.8*  HCT 30.6* 35.4*  PLT 159 170   BMET No results found for this basename: NA:2,K:2,CL:2,CO2:2,GLUCOSE:2,BUN:2,CREATININE:2,CALCIUM:2 in the last 72 hours PT/INR No results found for this basename: LABPROT:2,INR:2 in the last 72 hours ABG No results found for this basename: PHART:2,PCO2:2,PO2:2,HCO3:2 in the last 72 hours  Studies/Results: Dg Foot Complete Left  09/01/2011  *RADIOLOGY REPORT*  Clinical Data: Left toe pain.  Bruising in the great toe.  LEFT FOOT - COMPLETE 3+ VIEW  Comparison: None.  Findings: An oblique fracture is present in the proximal phalanx of the left great toe.  There is soft tissue swelling over the dorsal aspect of the metatarsals.  No additional fracture is present.  The joints are intact.  IMPRESSION: Oblique fracture within the proximal phalanx of the great toe is minimally-displaced.  Original Report Authenticated By: Jamesetta Orleans. MATTERN, M.D.    Anti-infectives: Anti-infectives     Start     Dose/Rate Route Frequency Ordered Stop   09/01/11 2200   ceFAZolin (ANCEF) IVPB 1 g/50 mL premix        1 g 100 mL/hr over 30 Minutes Intravenous 3 times per day 09/01/11 1937 09/02/11 1503   08/31/11  1515   ceFAZolin (ANCEF) IVPB 1 g/50 mL premix        1 g 100 mL/hr over 30 Minutes Intravenous 60 min pre-op 08/31/11 1505 09/01/11 1448          Assessment/Plan: s/p Procedure(s) (LRB): SIMPLE MASTECTOMY (Left) Discharge she looks well.  no complaints.  she should be okay for discharge today.  LOS: 2 days    Lodema Pilot DAVID 09/03/2011

## 2011-09-09 ENCOUNTER — Encounter (INDEPENDENT_AMBULATORY_CARE_PROVIDER_SITE_OTHER): Payer: Self-pay | Admitting: General Surgery

## 2011-09-09 ENCOUNTER — Ambulatory Visit (INDEPENDENT_AMBULATORY_CARE_PROVIDER_SITE_OTHER): Payer: BC Managed Care – PPO | Admitting: General Surgery

## 2011-09-09 VITALS — BP 104/72 | HR 68 | Temp 98.2°F | Resp 16 | Ht 59.5 in | Wt 160.6 lb

## 2011-09-09 DIAGNOSIS — Z09 Encounter for follow-up examination after completed treatment for conditions other than malignant neoplasm: Secondary | ICD-10-CM

## 2011-09-09 NOTE — Patient Instructions (Signed)
You may change the bandage or leave it off. May shower. Leave paper strips across the incision. Keep a clean Band-Aid over the drain sites.

## 2011-09-09 NOTE — Progress Notes (Signed)
History: The patient returns just over one week following left total mastectomy by Dr. Biagio Quint. She has some expected soreness but no major complaints. She's had some difficulty exactly measuring the amounts from the drains but there has been very minimal drainage from #2 and probably about 10 cc a day or so from #1  Exam: Her wound is healing without infection or complication. I removed drain #2.   We discussed wound care. We reviewed her pathology which showed no residual malignancy. She will return in one week.

## 2011-09-18 ENCOUNTER — Telehealth (INDEPENDENT_AMBULATORY_CARE_PROVIDER_SITE_OTHER): Payer: Self-pay

## 2011-09-18 ENCOUNTER — Encounter (INDEPENDENT_AMBULATORY_CARE_PROVIDER_SITE_OTHER): Payer: Self-pay | Admitting: General Surgery

## 2011-09-18 ENCOUNTER — Ambulatory Visit (INDEPENDENT_AMBULATORY_CARE_PROVIDER_SITE_OTHER): Payer: BC Managed Care – PPO | Admitting: General Surgery

## 2011-09-18 VITALS — BP 138/76 | HR 70 | Temp 97.8°F | Resp 16 | Ht 59.5 in | Wt 163.8 lb

## 2011-09-18 DIAGNOSIS — C50912 Malignant neoplasm of unspecified site of left female breast: Secondary | ICD-10-CM

## 2011-09-18 DIAGNOSIS — C50919 Malignant neoplasm of unspecified site of unspecified female breast: Secondary | ICD-10-CM

## 2011-09-18 NOTE — Progress Notes (Signed)
Subjective:     Patient ID: Martha Pham, female   DOB: January 27, 1951, 61 y.o.   MRN: [id number]  HPI This patient follows up status post left completion mastectomy for positive margins and a triple negative left breast tumor. She has done well since her surgery but she does have some continued discomfort which she attributes to the drain. She also complains of left foot discomfort from her broken toe which was diagnosed during her hospital stay. She has been recording a drain output and she is now down to less than 25 cc of thin brown fluid each day. She's here for drain removal. Her pathology demonstrated no residual tumor in the breast.  Review of Systems     Objective:   Physical Exam No acute distress and nontoxic-appearing Her incision is healing well without sign of infection her Steri-Strips are still in place. Her JP drain has serous output. She has no swelling in her arm. She does have some retraction in the area of her left breast and some redundant skin still present around the periphery of her breast and    Assessment:     Status post left completion mastectomy Her drain was removed today without palpitation. The incision is healing well without sign of infection. I am not pleased with the cosmesis to this point but otherwise she has done very well. I refilled her pain medication for her broken toe and some residual postoperative pain which she states is improving. I also gave her a prescription for the postmastectomy prostheses. She is now getting to followup with her medical oncologist Dr. Donnie Coffin for further evaluation and to proceed with adjuvant chemotherapy. She would not likely need any radiation therapy now that we have done a completion mastectomy.    Plan:     I will see her back in 2 weeks after her Steri-Strips have fallen off to evaluate the skin edges and incision and 3 months after that for other clinical exam.  I refilled her pain medication. She has prescription for  breast prosthesis. When she has completed her adjuvant therapy, we will have her see plastic surgery for evaluation for reconstruction.

## 2011-09-18 NOTE — Telephone Encounter (Signed)
Appointment time changed to 4:30 09/18/11 w/Dr. Biagio Quint

## 2011-09-18 NOTE — Telephone Encounter (Signed)
Left voice message for Martha Pham to call our office, need to move her appointment for today w/Dr. Biagio Quint to 4:15

## 2011-09-23 ENCOUNTER — Encounter: Payer: Self-pay | Admitting: *Deleted

## 2011-09-24 ENCOUNTER — Telehealth: Payer: Self-pay | Admitting: Oncology

## 2011-09-24 ENCOUNTER — Telehealth (INDEPENDENT_AMBULATORY_CARE_PROVIDER_SITE_OTHER): Payer: Self-pay

## 2011-09-24 NOTE — Telephone Encounter (Signed)
S/wthe pt and she is aware of her appt on 09/26/2011

## 2011-09-24 NOTE — Telephone Encounter (Signed)
Referral to Plastics/Reconstructive Surgery-- Appointment w/Dr. Kelly Splinter 10/14/11 @ 1:30 pm patient aware.

## 2011-09-26 ENCOUNTER — Ambulatory Visit (HOSPITAL_BASED_OUTPATIENT_CLINIC_OR_DEPARTMENT_OTHER): Payer: BC Managed Care – PPO | Admitting: Oncology

## 2011-09-26 ENCOUNTER — Telehealth: Payer: Self-pay | Admitting: *Deleted

## 2011-09-26 ENCOUNTER — Other Ambulatory Visit: Payer: Self-pay | Admitting: Oncology

## 2011-09-26 ENCOUNTER — Other Ambulatory Visit (HOSPITAL_BASED_OUTPATIENT_CLINIC_OR_DEPARTMENT_OTHER): Payer: BC Managed Care – PPO | Admitting: Lab

## 2011-09-26 ENCOUNTER — Other Ambulatory Visit: Payer: Self-pay | Admitting: Physician Assistant

## 2011-09-26 VITALS — BP 138/82 | HR 71 | Temp 98.4°F | Ht 59.5 in | Wt 160.7 lb

## 2011-09-26 DIAGNOSIS — C50919 Malignant neoplasm of unspecified site of unspecified female breast: Secondary | ICD-10-CM

## 2011-09-26 DIAGNOSIS — C50219 Malignant neoplasm of upper-inner quadrant of unspecified female breast: Secondary | ICD-10-CM

## 2011-09-26 DIAGNOSIS — Z938 Other artificial opening status: Secondary | ICD-10-CM

## 2011-09-26 LAB — CBC WITH DIFFERENTIAL/PLATELET
BASO%: 0.6 % (ref 0.0–2.0)
EOS%: 1.2 % (ref 0.0–7.0)
Eosinophils Absolute: 0 10*3/uL (ref 0.0–0.5)
LYMPH%: 39.2 % (ref 14.0–49.7)
MCH: 33.1 pg (ref 25.1–34.0)
MCHC: 33.7 g/dL (ref 31.5–36.0)
MCV: 98.1 fL (ref 79.5–101.0)
MONO%: 9.9 % (ref 0.0–14.0)
Platelets: 151 10*3/uL (ref 145–400)
RBC: 3.66 10*6/uL — ABNORMAL LOW (ref 3.70–5.45)
RDW: 12.4 % (ref 11.2–14.5)

## 2011-09-26 LAB — COMPREHENSIVE METABOLIC PANEL
AST: 15 U/L (ref 0–37)
Alkaline Phosphatase: 61 U/L (ref 39–117)
Glucose, Bld: 86 mg/dL (ref 70–99)
Sodium: 142 mEq/L (ref 135–145)
Total Bilirubin: 0.4 mg/dL (ref 0.3–1.2)
Total Protein: 6.7 g/dL (ref 6.0–8.3)

## 2011-09-26 MED ORDER — PROCHLORPERAZINE MALEATE 10 MG PO TABS: 10.0000 mg | ORAL_TABLET | Freq: Four times a day (QID) | ORAL | Status: AC | PRN

## 2011-09-26 MED ORDER — LIDOCAINE-PRILOCAINE 2.5-2.5 % EX CREA: TOPICAL_CREAM | Freq: Once | CUTANEOUS | Status: DC

## 2011-09-26 MED ORDER — DEXAMETHASONE 4 MG PO TABS: 4.0000 mg | ORAL_TABLET | Freq: Two times a day (BID) | ORAL | Status: DC

## 2011-09-26 MED ORDER — PROMETHAZINE HCL 25 MG RE SUPP: 25.0000 mg | Freq: Four times a day (QID) | RECTAL | Status: AC | PRN

## 2011-09-26 NOTE — Progress Notes (Signed)
Marrie returns for f/u. Surgery on 12/4 revealed a 2.2 cm N-, TN breast cancer, grade III. She has a port placed, with staging scans all of which were negative. I discussed the B49 study with her , which is a randomization between TC x 6, vs AC followed by taxol. She is meeting with one of the research coordinators and will let us know her decision.  In any event she wishes to start after xmas. I have given her info re- wigs . She will let usknow how she wants to proceed. 

## 2011-09-26 NOTE — Telephone Encounter (Signed)
gave patient appointments for 09-2011 [phone] 11-2011 printed out calendar and gave to the patient

## 2011-09-29 ENCOUNTER — Telehealth: Payer: Self-pay | Admitting: *Deleted

## 2011-09-29 MED ORDER — LIDOCAINE-PRILOCAINE 2.5-2.5 % EX CREA: TOPICAL_CREAM | CUTANEOUS | Status: DC | PRN

## 2011-09-30 ENCOUNTER — Ambulatory Visit (HOSPITAL_BASED_OUTPATIENT_CLINIC_OR_DEPARTMENT_OTHER): Payer: BC Managed Care – PPO

## 2011-09-30 ENCOUNTER — Other Ambulatory Visit: Payer: Self-pay

## 2011-09-30 ENCOUNTER — Encounter: Payer: Self-pay | Admitting: *Deleted

## 2011-09-30 ENCOUNTER — Other Ambulatory Visit: Payer: BC Managed Care – PPO

## 2011-09-30 DIAGNOSIS — C50919 Malignant neoplasm of unspecified site of unspecified female breast: Secondary | ICD-10-CM

## 2011-09-30 MED ORDER — HEPARIN SOD (PORK) LOCK FLUSH 100 UNIT/ML IV SOLN
500.0000 [IU] | Freq: Once | INTRAVENOUS | Status: AC
  Administered 2011-09-30: 500 [IU] via INTRAVENOUS
  Filled 2011-09-30: qty 5

## 2011-09-30 MED ORDER — SODIUM CHLORIDE 0.9 % IJ SOLN
10.0000 mL | INTRAMUSCULAR | Status: DC | PRN
  Administered 2011-09-30: 10 mL via INTRAVENOUS
  Filled 2011-09-30: qty 10

## 2011-09-30 NOTE — Progress Notes (Signed)
Patient due for 1st time chemo treatment tomorrow. Patient remained accessed and line capped.

## 2011-10-01 ENCOUNTER — Other Ambulatory Visit: Payer: Self-pay | Admitting: *Deleted

## 2011-10-01 ENCOUNTER — Ambulatory Visit (HOSPITAL_BASED_OUTPATIENT_CLINIC_OR_DEPARTMENT_OTHER): Payer: BC Managed Care – PPO

## 2011-10-01 VITALS — BP 162/99 | HR 63 | Temp 97.2°F

## 2011-10-01 DIAGNOSIS — Z5111 Encounter for antineoplastic chemotherapy: Secondary | ICD-10-CM

## 2011-10-01 DIAGNOSIS — C50219 Malignant neoplasm of upper-inner quadrant of unspecified female breast: Secondary | ICD-10-CM

## 2011-10-01 DIAGNOSIS — C50919 Malignant neoplasm of unspecified site of unspecified female breast: Secondary | ICD-10-CM

## 2011-10-01 DIAGNOSIS — D0590 Unspecified type of carcinoma in situ of unspecified breast: Secondary | ICD-10-CM

## 2011-10-01 MED ORDER — HEPARIN SOD (PORK) LOCK FLUSH 100 UNIT/ML IV SOLN
500.0000 [IU] | Freq: Once | INTRAVENOUS | Status: AC | PRN
  Administered 2011-10-01: 500 [IU]
  Filled 2011-10-01: qty 5

## 2011-10-01 MED ORDER — SODIUM CHLORIDE 0.9 % IV SOLN
Freq: Once | INTRAVENOUS | Status: AC
  Administered 2011-10-01: 12:00:00 via INTRAVENOUS

## 2011-10-01 MED ORDER — DEXAMETHASONE SODIUM PHOSPHATE 4 MG/ML IJ SOLN
20.0000 mg | Freq: Once | INTRAMUSCULAR | Status: AC
  Administered 2011-10-01: 20 mg via INTRAVENOUS

## 2011-10-01 MED ORDER — DEXAMETHASONE 4 MG PO TABS: ORAL_TABLET | ORAL | Status: DC

## 2011-10-01 MED ORDER — ONDANSETRON HCL 8 MG PO TABS: ORAL_TABLET | ORAL | Status: DC

## 2011-10-01 MED ORDER — DOCETAXEL CHEMO INJECTION 160 MG/16ML
75.0000 mg/m2 | Freq: Once | INTRAVENOUS | Status: AC
  Administered 2011-10-01: 130 mg via INTRAVENOUS
  Filled 2011-10-01: qty 13

## 2011-10-01 MED ORDER — ONDANSETRON 16 MG/50ML IVPB (CHCC)
16.0000 mg | Freq: Once | INTRAVENOUS | Status: AC
  Administered 2011-10-01: 16 mg via INTRAVENOUS

## 2011-10-01 MED ORDER — SODIUM CHLORIDE 0.9 % IV SOLN
600.0000 mg/m2 | Freq: Once | INTRAVENOUS | Status: AC
  Administered 2011-10-01: 1060 mg via INTRAVENOUS
  Filled 2011-10-01: qty 53

## 2011-10-01 MED ORDER — SODIUM CHLORIDE 0.9 % IJ SOLN
10.0000 mL | INTRAMUSCULAR | Status: DC | PRN
  Administered 2011-10-01: 10 mL
  Filled 2011-10-01: qty 10

## 2011-10-01 NOTE — Patient Instructions (Addendum)
[facility] Discharge Instructions for Patients Receiving Chemotherapy  Today you received the following chemotherapy agents Carboplatin and Taxotere.  To help prevent nausea and vomiting after your treatment, we encourage you to take your nausea medication as prescribed by your physician.  If you develop nausea and vomiting that is not controlled by your nausea medication, call the clinic. If it is after clinic hours your family physician or the after hours number for the clinic or go to the Emergency Department.   BELOW ARE SYMPTOMS THAT SHOULD BE REPORTED IMMEDIATELY:  *FEVER GREATER THAN 100.5 F  *CHILLS WITH OR WITHOUT FEVER  NAUSEA AND VOMITING THAT IS NOT CONTROLLED WITH YOUR NAUSEA MEDICATION  *UNUSUAL SHORTNESS OF BREATH  *UNUSUAL BRUISING OR BLEEDING  TENDERNESS IN MOUTH AND THROAT WITH OR WITHOUT PRESENCE OF ULCERS  *URINARY PROBLEMS  *BOWEL PROBLEMS  UNUSUAL RASH Items with * indicate a potential emergency and should be followed up as soon as possible.  One of the nurses will contact you 24 hours after your treatment. Please let the nurse know about any problems that you may have experienced. Feel free to call the clinic you have any questions or concerns. The clinic phone number is [phone].  Don't forget to pick up prescriptions for  Decadron and Zofran, follow directions provided by Jan, RN.   I have been informed and understand all the instructions given to me. I know to contact the clinic, my physician, or go to the Emergency Department if any problems should occur. I do not have any questions at this time, but understand that I may call the clinic during office hours   should I have any questions or need assistance in obtaining follow up care.    __________________________________________  _____________  __________ Signature of Patient or Authorized Representative            Date                    Time    __________________________________________ Nurse's Signature

## 2011-10-02 ENCOUNTER — Telehealth: Payer: Self-pay | Admitting: *Deleted

## 2011-10-02 ENCOUNTER — Ambulatory Visit (HOSPITAL_BASED_OUTPATIENT_CLINIC_OR_DEPARTMENT_OTHER): Payer: BC Managed Care – PPO

## 2011-10-02 VITALS — BP 162/86 | HR 73 | Temp 97.4°F

## 2011-10-02 DIAGNOSIS — C50219 Malignant neoplasm of upper-inner quadrant of unspecified female breast: Secondary | ICD-10-CM

## 2011-10-02 DIAGNOSIS — Z5189 Encounter for other specified aftercare: Secondary | ICD-10-CM

## 2011-10-02 DIAGNOSIS — D0590 Unspecified type of carcinoma in situ of unspecified breast: Secondary | ICD-10-CM

## 2011-10-02 MED ORDER — PEGFILGRASTIM INJECTION 6 MG/0.6ML
6.0000 mg | Freq: Once | SUBCUTANEOUS | Status: AC
  Administered 2011-10-02: 6 mg via SUBCUTANEOUS
  Filled 2011-10-02: qty 0.6

## 2011-10-02 NOTE — Patient Instructions (Signed)
Pt instructed to call for questions/concerns.  Pt to call for pain associated with neulasta.

## 2011-10-02 NOTE — Telephone Encounter (Signed)
Message copied by Augusto Garbe on Thu Oct 02, 2011 11:17 AM ------      Message from: Hilbert Corrigan      Created: Wed Oct 01, 2011  4:13 PM       Here on 3-13 1st time taxotere, cytoxan. Sorry, forget to get number. I will next time.

## 2011-10-02 NOTE — Telephone Encounter (Signed)
Message left requesting a return call for chemotherapy f/u. 

## 2011-10-08 ENCOUNTER — Ambulatory Visit (HOSPITAL_BASED_OUTPATIENT_CLINIC_OR_DEPARTMENT_OTHER): Payer: BC Managed Care – PPO | Admitting: Physician Assistant

## 2011-10-08 ENCOUNTER — Telehealth: Payer: Self-pay | Admitting: *Deleted

## 2011-10-08 ENCOUNTER — Other Ambulatory Visit (HOSPITAL_BASED_OUTPATIENT_CLINIC_OR_DEPARTMENT_OTHER): Payer: BC Managed Care – PPO | Admitting: Lab

## 2011-10-08 VITALS — BP 116/75 | HR 108 | Temp 98.3°F | Ht 59.5 in | Wt 160.1 lb

## 2011-10-08 DIAGNOSIS — C50919 Malignant neoplasm of unspecified site of unspecified female breast: Secondary | ICD-10-CM

## 2011-10-08 DIAGNOSIS — C50219 Malignant neoplasm of upper-inner quadrant of unspecified female breast: Secondary | ICD-10-CM

## 2011-10-08 LAB — BASIC METABOLIC PANEL
CO2: 24 mEq/L (ref 19–32)
Calcium: 9 mg/dL (ref 8.4–10.5)
Creatinine, Ser: 0.73 mg/dL (ref 0.50–1.10)
Glucose, Bld: 147 mg/dL — ABNORMAL HIGH (ref 70–99)

## 2011-10-08 LAB — CBC WITH DIFFERENTIAL/PLATELET
BASO%: 0.3 % (ref 0.0–2.0)
Eosinophils Absolute: 0 10*3/uL (ref 0.0–0.5)
HCT: 37.7 % (ref 34.8–46.6)
LYMPH%: 25.2 % (ref 14.0–49.7)
MCHC: 33 g/dL (ref 31.5–36.0)
MCV: 97.8 fL (ref 79.5–101.0)
MONO#: 0.4 10*3/uL (ref 0.1–0.9)
MONO%: 5.8 % (ref 0.0–14.0)
NEUT%: 68.1 % (ref 38.4–76.8)
Platelets: 105 10*3/uL — ABNORMAL LOW (ref 145–400)
WBC: 7.5 10*3/uL (ref 3.9–10.3)

## 2011-10-08 NOTE — Patient Instructions (Signed)
1. Pertaining to bone pain associated with Neulasta: Continue Claritin daily, may use Advil or Aleve (as directed). Drink plenty of fluids.  2. Pertaining to headache, may be from Zofran, therefore can try Benadryl 25-50mg , every 6-8hrs.(you may use with Claritin-they work in different ways.)  3. Continue eye drops, increase frequency to twice a day.

## 2011-10-08 NOTE — Progress Notes (Signed)
Hematology and Oncology Follow Up Visit  Martha Pham [id number] October 01, 1950 61 y.o. 10/08/2011    HPI: Martha Pham is a 61 year old [place] woman with a history of a T2, N0, triple negative infiltrating ductal carcinoma of the left breast, for which she underwent an initial left lumpectomy with sentinel node dissection on 06/24/2011, followed by re-excision to positive margins, followed by a left simple mastectomy on 09/01/2011 due to positive margins. Following the mastectomy the margins were clear. She is currently day 7 cycle one of 4 planned adjuvant every 3 week Taxotere/Cytoxan with Neulasta support on day 2.  Interim History:   Martha Pham is seen today for followup after her first of 4 planned cycles of every 3 week Taxotere/Cytoxan given in the adjuvant setting for her history of a stage II, T2 N0, triple negative left breast carcinoma. She is feeling well, denying any unexplained fevers, chills, or night sweats. She denies any persistent nausea issues, no diarrhea, or unusual constipation though she did experience some constipation for the first couple of days following her treatment. She received Neulasta on day 2, and has experienced some flare of chronic low back pain with radiation to the left hip and left leg, but again this is chronic. She also has experienced some sternal pain intermittently, but denies frank heartburn. She has had some change in her sense of taste but denies any frank mouth sores. She denies any increased eye dryness. She denies any frank neuropathy symptoms that she did have a little sensitivity of her left hand which was transient. She denies any skin changes.  A detailed review of systems is otherwise noncontributory as noted below.  Review of Systems: Constitutional:  no weight loss, fever, night sweats and fatigued Eyes: dry eyes and uses contacts ENT: abit of taste change. Cardiovascular: no chest pain or dyspnea on exertion Respiratory: no  cough, shortness of breath, or wheezing Neurological: no TIA or stroke symptoms Dermatological: negative Gastrointestinal: no abdominal pain, change in bowel habits, or black or bloody stools Genito-Urinary: no dysuria, trouble voiding, or hematuria Hematological and Lymphatic: negative Breast: negative Musculoskeletal: positive for - pain in back - left, lower which is chronic in nature. Remaining ROS negative.   Medications:   I have reviewed the patient's current medications.  Current Outpatient Prescriptions  Medication Sig Dispense Refill  . atorvastatin (LIPITOR) 10 MG tablet Take 10 mg by mouth daily.        . calcium carbonate (OS-CAL) 600 MG TABS Take 600 mg by mouth 2 (two) times daily with a meal.        . Carboxymethylcellulose Sodium (REFRESH OP) Apply 1 drop to eye 2 (two) times daily as needed. For dry eyes      . dexamethasone (DECADRON) 4 MG tablet Take 2 tablets two times a day the day before Taxotere.  Then take 2 tabs two times a day starting the day after chemo for 3 days  30 tablet  1  . HYDROcodone-acetaminophen (NORCO) 5-325 MG per tablet as needed.      . lidocaine-prilocaine (EMLA) cream Apply topically as needed.  30 g  0  . losartan-hydrochlorothiazide (HYZAAR) 50-12.5 MG per tablet 1 tablet daily.       . Multiple Vitamin (MULTIVITAMIN) capsule Take 1 capsule by mouth daily.        . ondansetron (ZOFRAN) 8 MG tablet Take 1 tablet two times a day starting the day after chemo for 3 days.  Then take one tab two times  a day as needed for nausea or vomiting.  30 tablet  1    Allergies:  Allergies  Allergen Reactions  . Dust Mite Extract Itching  . Other     grass    Physical Exam: Filed Vitals:   10/08/11 0959  BP: 116/75  Pulse: 108  Temp: 98.3 F (36.8 C)    Body mass index is 31.80 kg/(m^2). Weight: 160 lbs. HEENT:  Sclerae anicteric, conjunctivae pink.  Oropharynx clear.  No mucositis or candidiasis.   Nodes:  No cervical, supraclavicular, or  axillary lymphadenopathy palpated.  Breast Exam: Deferred  Lungs:  Clear to auscultation bilaterally.  No crackles, rhonchi, or wheezes.   Heart:  Regular rate and rhythm.   Abdomen:  Soft, nontender.  Positive bowel sounds.  No organomegaly or masses palpated.   Musculoskeletal:  No focal spinal tenderness to palpation.  Extremities:  Benign.  No peripheral edema or cyanosis.   Skin:  Benign.   Neuro:  Nonfocal, alert and oriented x 3   Lab Results: Lab Results  Component Value Date   WBC 7.5 10/08/2011   HGB 12.5 10/08/2011   HCT 37.7 10/08/2011   MCV 97.8 10/08/2011   PLT 105* 10/08/2011   NEUTROABS 5.1 10/08/2011     Chemistry      Component Value Date/Time   NA 142 09/26/2011 0906   K 3.8 09/26/2011 0906   CL 107 09/26/2011 0906   CO2 28 09/26/2011 0906   BUN 14 09/26/2011 0906   CREATININE 0.79 09/26/2011 0906      Component Value Date/Time   CALCIUM 9.6 09/26/2011 0906   ALKPHOS 61 09/26/2011 0906   AST 15 09/26/2011 0906   ALT 17 09/26/2011 0906   BILITOT 0.4 09/26/2011 0906      Lab Results  Component Value Date   LABCA2 33 06/11/2011     Assessment:  Martha Pham is a 61 year old [place] woman with a history of a T2, N0, triple negative infiltrating ductal carcinoma of the left breast, for which she underwent an initial left lumpectomy with sentinel node dissection on 06/24/2011, followed by re-excision to positive margins, followed by a left simple mastectomy on 09/01/2011 due to positive margins. Following the mastectomy the margins were clear. She is currently day 7 cycle one of 4 planned adjuvant every 3 week Taxotere/Cytoxan with Neulasta support on day 2.  Case reviewed with Dr. Pierce Crane.  Plan:  Martha Pham has been given patient instructions pertaining to the use of Claritin for Neulasta induced bone pain, which of note she does take daily for seasonal allergic rhinitis issues.  She could also try to use Advil and Aleve as directed with plenty of fluids. She  also notes that she could utilize Benadryl 25-50 mg for the probable ondansetron induced headaches following her treatment. I have recommended that she increase the frequency of her eyedrops to twice a day. I will see her in 2 weeks' time, specifically a 10/22/2011 prior to day 1 cycle 2 of her Taxotere/Cytoxan regimen. She does be be happy to see her in the interim if the need should arise.  This plan was reviewed with the patient, who voices understanding and agreement.  She knows to call with any changes or problems.    Martha Pham T, PA-C 10/08/2011

## 2011-10-08 NOTE — Telephone Encounter (Signed)
gave patient appointment for 10-2011 thru 11-2011 printed out calendar and gave to the patient

## 2011-10-10 ENCOUNTER — Telehealth (INDEPENDENT_AMBULATORY_CARE_PROVIDER_SITE_OTHER): Payer: Self-pay | Admitting: General Surgery

## 2011-10-21 ENCOUNTER — Other Ambulatory Visit: Payer: Self-pay | Admitting: Physician Assistant

## 2011-10-21 DIAGNOSIS — C50219 Malignant neoplasm of upper-inner quadrant of unspecified female breast: Secondary | ICD-10-CM

## 2011-10-22 ENCOUNTER — Ambulatory Visit (HOSPITAL_BASED_OUTPATIENT_CLINIC_OR_DEPARTMENT_OTHER): Payer: BC Managed Care – PPO | Admitting: Physician Assistant

## 2011-10-22 ENCOUNTER — Encounter: Payer: Self-pay | Admitting: Physician Assistant

## 2011-10-22 ENCOUNTER — Other Ambulatory Visit (HOSPITAL_BASED_OUTPATIENT_CLINIC_OR_DEPARTMENT_OTHER): Payer: BC Managed Care – PPO | Admitting: Lab

## 2011-10-22 ENCOUNTER — Ambulatory Visit (HOSPITAL_BASED_OUTPATIENT_CLINIC_OR_DEPARTMENT_OTHER): Payer: BC Managed Care – PPO

## 2011-10-22 VITALS — BP 127/77 | HR 86 | Temp 98.0°F | Ht 59.5 in | Wt 163.4 lb

## 2011-10-22 DIAGNOSIS — C50919 Malignant neoplasm of unspecified site of unspecified female breast: Secondary | ICD-10-CM

## 2011-10-22 DIAGNOSIS — C50912 Malignant neoplasm of unspecified site of left female breast: Secondary | ICD-10-CM

## 2011-10-22 DIAGNOSIS — C50219 Malignant neoplasm of upper-inner quadrant of unspecified female breast: Secondary | ICD-10-CM

## 2011-10-22 DIAGNOSIS — Z5111 Encounter for antineoplastic chemotherapy: Secondary | ICD-10-CM

## 2011-10-22 LAB — COMPREHENSIVE METABOLIC PANEL
AST: 15 U/L (ref 0–37)
Alkaline Phosphatase: 67 U/L (ref 39–117)
BUN: 12 mg/dL (ref 6–23)
Calcium: 9.6 mg/dL (ref 8.4–10.5)
Creatinine, Ser: 0.76 mg/dL (ref 0.50–1.10)
Glucose, Bld: 100 mg/dL — ABNORMAL HIGH (ref 70–99)

## 2011-10-22 LAB — CBC WITH DIFFERENTIAL/PLATELET
BASO%: 0.8 % (ref 0.0–2.0)
EOS%: 0.7 % (ref 0.0–7.0)
MCH: 32.6 pg (ref 25.1–34.0)
MCHC: 32.9 g/dL (ref 31.5–36.0)
RDW: 12.6 % (ref 11.2–14.5)
lymph#: 1.5 10*3/uL (ref 0.9–3.3)
nRBC: 0 % (ref 0–0)

## 2011-10-22 MED ORDER — ONDANSETRON 16 MG/50ML IVPB (CHCC)
16.0000 mg | Freq: Once | INTRAVENOUS | Status: AC
  Administered 2011-10-22: 16 mg via INTRAVENOUS

## 2011-10-22 MED ORDER — DEXAMETHASONE SODIUM PHOSPHATE 4 MG/ML IJ SOLN
20.0000 mg | Freq: Once | INTRAMUSCULAR | Status: AC
  Administered 2011-10-22: 20 mg via INTRAVENOUS

## 2011-10-22 MED ORDER — DOCETAXEL CHEMO INJECTION 160 MG/16ML
75.0000 mg/m2 | Freq: Once | INTRAVENOUS | Status: AC
  Administered 2011-10-22: 130 mg via INTRAVENOUS
  Filled 2011-10-22: qty 13

## 2011-10-22 MED ORDER — SODIUM CHLORIDE 0.9 % IV SOLN
600.0000 mg/m2 | Freq: Once | INTRAVENOUS | Status: AC
  Administered 2011-10-22: 1060 mg via INTRAVENOUS
  Filled 2011-10-22: qty 53

## 2011-10-22 MED ORDER — SODIUM CHLORIDE 0.9 % IV SOLN
Freq: Once | INTRAVENOUS | Status: AC
  Administered 2011-10-22: 15:00:00 via INTRAVENOUS

## 2011-10-23 ENCOUNTER — Ambulatory Visit (HOSPITAL_BASED_OUTPATIENT_CLINIC_OR_DEPARTMENT_OTHER): Payer: BC Managed Care – PPO

## 2011-10-23 VITALS — BP 131/79 | HR 78 | Temp 97.4°F

## 2011-10-23 DIAGNOSIS — C50219 Malignant neoplasm of upper-inner quadrant of unspecified female breast: Secondary | ICD-10-CM

## 2011-10-23 DIAGNOSIS — Z5189 Encounter for other specified aftercare: Secondary | ICD-10-CM

## 2011-10-23 MED ORDER — PEGFILGRASTIM INJECTION 6 MG/0.6ML
6.0000 mg | Freq: Once | SUBCUTANEOUS | Status: AC
  Administered 2011-10-23: 6 mg via SUBCUTANEOUS
  Filled 2011-10-23: qty 0.6

## 2011-10-23 NOTE — Patient Instructions (Signed)
Pt in for injection, pt only stated she if more fatigued.  Injection given with the instructions to call with questions and concerns.

## 2011-10-24 NOTE — Progress Notes (Signed)
Hematology and Oncology Follow Up Visit  TRINIKA CORTESE [id number] [birth year] 61 y.o. 10/24/2011    HPI: Ms. Brickner is a 61 year old [place] woman with a history of a T2, N0, triple negative infiltrating ductal carcinoma of the left breast, for which she underwent an initial left lumpectomy with sentinel node dissection on 06/24/2011, followed by re-excision to positive margins, followed by a left simple mastectomy on 09/01/2011 due to positive margins. Following the mastectomy the margins were clear. She is currently day 1 cycle 2/4 planned adjuvant every 3 week Taxotere/Cytoxan with Neulasta support on day 2.  Interim History:   Lusine is seen today for followup prior to her second of 4 planned doses of every three-week Taxotere/Cytoxan being given in the adjuvant setting. She is feeling well today, denying any fevers, chills, night sweats, no shortness of breath or chest pain. She denies nausea, emesis, diarrhea or constipation issues. No hand-foot changes, and numbness or tingling of the fingertips and toes. She denies any excessive eye tearing. Energy level is actually quite good. A detailed review of systems is otherwise noncontributory as noted below.  Review of Systems: Constitutional:  no weight loss, fever, night sweats and fatigued Eyes: dry eyes and uses contacts ENT: abit of taste change. Cardiovascular: no chest pain or dyspnea on exertion Respiratory: no cough, shortness of breath, or wheezing Neurological: no TIA or stroke symptoms Dermatological: negative Gastrointestinal: no abdominal pain, change in bowel habits, or black or bloody stools Genito-Urinary: no dysuria, trouble voiding, or hematuria Hematological and Lymphatic: negative Breast: negative Musculoskeletal: positive for - pain in back - left, lower which is chronic in nature. Remaining ROS negative.   Medications:   I have reviewed the patient's current medications.  Current Outpatient  Prescriptions  Medication Sig Dispense Refill  . atorvastatin (LIPITOR) 10 MG tablet Take 10 mg by mouth daily.        . calcium carbonate (OS-CAL) 600 MG TABS Take 600 mg by mouth 2 (two) times daily with a meal.        . Carboxymethylcellulose Sodium (REFRESH OP) Apply 1 drop to eye 2 (two) times daily as needed. For dry eyes      . dexamethasone (DECADRON) 4 MG tablet Take 2 tablets two times a day the day before Taxotere.  Then take 2 tabs two times a day starting the day after chemo for 3 days  30 tablet  1  . HYDROcodone-acetaminophen (NORCO) 5-325 MG per tablet as needed.      . lidocaine-prilocaine (EMLA) cream Apply topically as needed.  30 g  0  . losartan-hydrochlorothiazide (HYZAAR) 50-12.5 MG per tablet 1 tablet daily.       . Multiple Vitamin (MULTIVITAMIN) capsule Take 1 capsule by mouth daily.        . ondansetron (ZOFRAN) 8 MG tablet Take 1 tablet two times a day starting the day after chemo for 3 days.  Then take one tab two times a day as needed for nausea or vomiting.  30 tablet  1  . DISCONTD: MICARDIS HCT 40-12.5 MG per tablet Take 1 tablet by mouth Daily.       No current facility-administered medications for this visit.   Facility-Administered Medications Ordered in Other Visits  Medication Dose Route Frequency Provider Last Rate Last Dose  . pegfilgrastim (NEULASTA) injection 6 mg  6 mg Subcutaneous Once Amada Kingfisher, PA   6 mg at 10/23/11 1321    Allergies:  Allergies  Allergen Reactions  .  Dust Mite Extract Itching  . Other     grass    Physical Exam: Filed Vitals:   10/22/11 1315  BP: 127/77  Pulse: 86  Temp: 98 F (36.7 C)    Body mass index is 32.45 kg/(m^2). Weight: 163 lbs. HEENT:  Sclerae anicteric, conjunctivae pink.  Oropharynx clear.  No mucositis or candidiasis.   Nodes:  No cervical, supraclavicular, or axillary lymphadenopathy palpated.  Breast Exam: Deferred  Lungs:  Clear to auscultation bilaterally.  No crackles, rhonchi, or  wheezes.   Heart:  Regular rate and rhythm.   Abdomen:  Soft, nontender.  Positive bowel sounds.  No organomegaly or masses palpated.   Musculoskeletal:  No focal spinal tenderness to palpation.  Extremities:  Benign.  No peripheral edema or cyanosis.   Skin:  Benign.   Neuro:  Nonfocal, alert and oriented x 3   Lab Results: Lab Results  Component Value Date   WBC 4.3 10/22/2011   HGB 12.2 10/22/2011   HCT 37.0 10/22/2011   MCV 99.2 10/22/2011   PLT 193 10/22/2011   NEUTROABS 2.1 10/22/2011     Chemistry      Component Value Date/Time   NA 142 10/22/2011 1243   K 3.8 10/22/2011 1243   CL 108 10/22/2011 1243   CO2 27 10/22/2011 1243   BUN 12 10/22/2011 1243   CREATININE 0.76 10/22/2011 1243      Component Value Date/Time   CALCIUM 9.6 10/22/2011 1243   ALKPHOS 67 10/22/2011 1243   AST 15 10/22/2011 1243   ALT 17 10/22/2011 1243   BILITOT 0.5 10/22/2011 1243      Lab Results  Component Value Date   LABCA2 33 06/11/2011     Assessment:  Ms. Meas is a 61 year old [place] woman with a history of a T2, N0, triple negative infiltrating ductal carcinoma of the left breast, for which she underwent an initial left lumpectomy with sentinel node dissection on 06/24/2011, followed by re-excision to positive margins, followed by a left simple mastectomy on 09/01/2011 due to positive margins. Following the mastectomy the margins were clear. She is currently day 1 cycle 2/4 planned adjuvant every 3 week Taxotere/Cytoxan with Neulasta support on day 2.  Case reviewed with Dr. Pierce Crane.  Plan:  Kellianne will receive day 1 cycle 2/4 planned q3 week doses of Taxotere/Cytoxan today as scheduled. She will return on day 2 for Neulasta providing granulocytes support. I will see her in one week's time for nadir assessment. This plan was reviewed with the patient, who voices understanding and agreement.  She knows to call with any changes or problems.    Jaquayla Hege T, PA-C 10/22/11

## 2011-10-27 ENCOUNTER — Other Ambulatory Visit: Payer: Self-pay | Admitting: Certified Registered Nurse Anesthetist

## 2011-10-29 ENCOUNTER — Ambulatory Visit (HOSPITAL_BASED_OUTPATIENT_CLINIC_OR_DEPARTMENT_OTHER): Payer: BC Managed Care – PPO | Admitting: Physician Assistant

## 2011-10-29 ENCOUNTER — Other Ambulatory Visit (HOSPITAL_BASED_OUTPATIENT_CLINIC_OR_DEPARTMENT_OTHER): Payer: BC Managed Care – PPO

## 2011-10-29 ENCOUNTER — Ambulatory Visit: Payer: BC Managed Care – PPO | Admitting: Physician Assistant

## 2011-10-29 VITALS — BP 117/69 | HR 109 | Temp 98.4°F | Ht 59.5 in | Wt 162.7 lb

## 2011-10-29 DIAGNOSIS — R42 Dizziness and giddiness: Secondary | ICD-10-CM

## 2011-10-29 DIAGNOSIS — C50919 Malignant neoplasm of unspecified site of unspecified female breast: Secondary | ICD-10-CM

## 2011-10-29 DIAGNOSIS — C50219 Malignant neoplasm of upper-inner quadrant of unspecified female breast: Secondary | ICD-10-CM

## 2011-10-29 DIAGNOSIS — C50912 Malignant neoplasm of unspecified site of left female breast: Secondary | ICD-10-CM

## 2011-10-29 LAB — CBC WITH DIFFERENTIAL/PLATELET
Basophils Absolute: 0.1 10*3/uL (ref 0.0–0.1)
Eosinophils Absolute: 0 10*3/uL (ref 0.0–0.5)
HGB: 11.3 g/dL — ABNORMAL LOW (ref 11.6–15.9)
MCV: 100 fL (ref 79.5–101.0)
MONO#: 2.8 10*3/uL — ABNORMAL HIGH (ref 0.1–0.9)
MONO%: 22.9 % — ABNORMAL HIGH (ref 0.0–14.0)
NEUT#: 7.3 10*3/uL — ABNORMAL HIGH (ref 1.5–6.5)
RDW: 12.6 % (ref 11.2–14.5)
WBC: 12.2 10*3/uL — ABNORMAL HIGH (ref 3.9–10.3)
nRBC: 0 % (ref 0–0)

## 2011-10-29 LAB — COMPREHENSIVE METABOLIC PANEL
ALT: 25 U/L (ref 0–35)
Albumin: 3.6 g/dL (ref 3.5–5.2)
CO2: 31 mEq/L (ref 19–32)
Calcium: 9.6 mg/dL (ref 8.4–10.5)
Chloride: 106 mEq/L (ref 96–112)
Glucose, Bld: 131 mg/dL — ABNORMAL HIGH (ref 70–99)
Potassium: 4 mEq/L (ref 3.5–5.3)
Sodium: 142 mEq/L (ref 135–145)
Total Bilirubin: 0.3 mg/dL (ref 0.3–1.2)
Total Protein: 6.8 g/dL (ref 6.0–8.3)

## 2011-10-29 NOTE — Progress Notes (Signed)
Hematology and Oncology Follow Up Visit  WEDA BAUMGARNER [id number] 03-18-1951 61 y.o. 10/29/2011    HPI: Ms. Mckiver is a 61 year old [place] woman with a history of a T2, N0, triple negative infiltrating ductal carcinoma of the left breast, for which she underwent an initial left lumpectomy with sentinel node dissection on 06/24/2011, followed by re-excision to positive margins, followed by a left simple mastectomy on 09/01/2011 due to positive margins. Following the mastectomy the margins were clear. She is currently day 7 cycle 2/4 planned adjuvant every 3 week Taxotere/Cytoxan with Neulasta support on day 2.  Interim History:   Katalaya is seen today for followup after her second of 4 planned cycles of every 3 week Taxotere/Cytoxan given in the adjuvant setting for her history of a stage II, T2 N0, triple negative left breast carcinoma. She is feeling well, denying any unexplained fevers, chills, or night sweats. She denies any persistent nausea issues, no diarrhea, or unusual constipation though she did experience some constipation for the first couple of days following her treatment. She received Neulasta on day 2, and has experienced some flare of chronic low back pain with radiation to the left hip and left leg, but again this is chronic. She also has experienced some sternal pain intermittently, but denies frank heartburn. She has had some change in her sense of taste but denies any frank mouth sores. She denies any increased eye dryness. She denies any frank neuropathy symptoms that she did have a little sensitivity of her left hand which was transient. She denies any skin changes. She has noted occasional dizziness, her blood pressure is a bit low today, she has continued on Hyzaar daily, and dose not have a blood pressure moniter at home.  A detailed review of systems is otherwise noncontributory as noted below.  Review of Systems: Constitutional:  no weight loss, fever, night  sweats and fatigued Eyes: dry eyes and uses contacts ENT: abit of taste change. Cardiovascular: no chest pain or dyspnea on exertion Respiratory: no cough, shortness of breath, or wheezing Neurological: no TIA or stroke symptoms Dermatological: negative Gastrointestinal: no abdominal pain, change in bowel habits, or black or bloody stools Genito-Urinary: no dysuria, trouble voiding, or hematuria Hematological and Lymphatic: negative Breast: negative Musculoskeletal: positive for - pain in back - left, lower which is chronic in nature. Remaining ROS negative.   Medications:   I have reviewed the patient's current medications.  Current Outpatient Prescriptions  Medication Sig Dispense Refill  . atorvastatin (LIPITOR) 10 MG tablet Take 10 mg by mouth daily.        . calcium carbonate (OS-CAL) 600 MG TABS Take 600 mg by mouth 2 (two) times daily with a meal.        . Carboxymethylcellulose Sodium (REFRESH OP) Apply 1 drop to eye 2 (two) times daily as needed. For dry eyes      . dexamethasone (DECADRON) 4 MG tablet Take 2 tablets two times a day the day before Taxotere.  Then take 2 tabs two times a day starting the day after chemo for 3 days  30 tablet  1  . HYDROcodone-acetaminophen (NORCO) 5-325 MG per tablet as needed.      . lidocaine-prilocaine (EMLA) cream Apply topically as needed.  30 g  0  . losartan-hydrochlorothiazide (HYZAAR) 50-12.5 MG per tablet 1 tablet daily.       . Multiple Vitamin (MULTIVITAMIN) capsule Take 1 capsule by mouth daily.        Marland Kitchen  ondansetron (ZOFRAN) 8 MG tablet Take 1 tablet two times a day starting the day after chemo for 3 days.  Then take one tab two times a day as needed for nausea or vomiting.  30 tablet  1  . DISCONTD: MICARDIS HCT 40-12.5 MG per tablet Take 1 tablet by mouth Daily.        Allergies:  Allergies  Allergen Reactions  . Dust Mite Extract Itching  . Other     grass    Physical Exam: Filed Vitals:   10/29/11 1514  BP: 117/69    Pulse: 109  Temp: 98.4 F (36.9 C)    Body mass index is 32.31 kg/(m^2). Weight: 162 lbs. HEENT:  Sclerae anicteric, conjunctivae pink.  Oropharynx clear.  No mucositis or candidiasis.   Nodes:  No cervical, supraclavicular, or axillary lymphadenopathy palpated.  Breast Exam: Deferred  Lungs:  Clear to auscultation bilaterally.  No crackles, rhonchi, or wheezes.   Heart:  Regular rate and rhythm.   Abdomen:  Soft, nontender.  Positive bowel sounds.  No organomegaly or masses palpated.   Musculoskeletal:  No focal spinal tenderness to palpation.  Extremities:  Benign.  No peripheral edema or cyanosis.   Skin:  Benign.   Neuro:  Nonfocal, alert and oriented x 3   Lab Results: Lab Results  Component Value Date   WBC 12.2* 10/29/2011   HGB 11.3* 10/29/2011   HCT 35.0 10/29/2011   MCV 100.0 10/29/2011   PLT 131* 10/29/2011   NEUTROABS 7.3* 10/29/2011     Chemistry      Component Value Date/Time   NA 142 10/29/2011 1501   K 4.0 10/29/2011 1501   CL 106 10/29/2011 1501   CO2 31 10/29/2011 1501   BUN 10 10/29/2011 1501   CREATININE 0.71 10/29/2011 1501      Component Value Date/Time   CALCIUM 9.6 10/29/2011 1501   ALKPHOS 98 10/29/2011 1501   AST 25 10/29/2011 1501   ALT 25 10/29/2011 1501   BILITOT 0.3 10/29/2011 1501      Lab Results  Component Value Date   LABCA2 33 06/11/2011     Assessment:  Ms. Nodal is a 61 year old [place] woman with a history of a T2, N0, triple negative infiltrating ductal carcinoma of the left breast, for which she underwent an initial left lumpectomy with sentinel node dissection on 06/24/2011, followed by re-excision to positive margins, followed by a left simple mastectomy on 09/01/2011 due to positive margins. Following the mastectomy the margins were clear. She is currently day 7 cycle 2/4 planned adjuvant every 3 week Taxotere/Cytoxan with Neulasta support on day 2.  Case reviewed with Dr. Pierce Crane.  Plan:  Sheza is doing  well.  She will moniter her blood pressures. I will see her in 2 weeks' time, specifically a 11/12/2011 prior to day 1 cycle 3/4 of her Taxotere/Cytoxan regimen. She does be be happy to see her in the interim if the need should arise.  This plan was reviewed with the patient, who voices understanding and agreement.  She knows to call with any changes or problems.    Emerlyn Mehlhoff T, PA-C 10/29/2011

## 2011-11-11 ENCOUNTER — Other Ambulatory Visit: Payer: Self-pay | Admitting: Physician Assistant

## 2011-11-11 DIAGNOSIS — C50219 Malignant neoplasm of upper-inner quadrant of unspecified female breast: Secondary | ICD-10-CM

## 2011-11-12 ENCOUNTER — Ambulatory Visit (HOSPITAL_BASED_OUTPATIENT_CLINIC_OR_DEPARTMENT_OTHER): Payer: BC Managed Care – PPO

## 2011-11-12 ENCOUNTER — Ambulatory Visit: Payer: BC Managed Care – PPO | Admitting: Physician Assistant

## 2011-11-12 ENCOUNTER — Other Ambulatory Visit: Payer: BC Managed Care – PPO

## 2011-11-12 ENCOUNTER — Encounter: Payer: Self-pay | Admitting: Physician Assistant

## 2011-11-12 VITALS — BP 143/80 | HR 93 | Temp 97.6°F | Ht 59.5 in | Wt 170.0 lb

## 2011-11-12 DIAGNOSIS — C50219 Malignant neoplasm of upper-inner quadrant of unspecified female breast: Secondary | ICD-10-CM

## 2011-11-12 DIAGNOSIS — Z5111 Encounter for antineoplastic chemotherapy: Secondary | ICD-10-CM

## 2011-11-12 DIAGNOSIS — C50919 Malignant neoplasm of unspecified site of unspecified female breast: Secondary | ICD-10-CM

## 2011-11-12 LAB — COMPREHENSIVE METABOLIC PANEL
Alkaline Phosphatase: 60 U/L (ref 39–117)
BUN: 9 mg/dL (ref 6–23)
CO2: 24 mEq/L (ref 19–32)
Glucose, Bld: 106 mg/dL — ABNORMAL HIGH (ref 70–99)
Total Bilirubin: 0.3 mg/dL (ref 0.3–1.2)

## 2011-11-12 LAB — LACTATE DEHYDROGENASE: LDH: 224 U/L (ref 94–250)

## 2011-11-12 LAB — CBC WITH DIFFERENTIAL/PLATELET
Basophils Absolute: 0 10*3/uL (ref 0.0–0.1)
EOS%: 0.5 % (ref 0.0–7.0)
HCT: 34.7 % — ABNORMAL LOW (ref 34.8–46.6)
HGB: 11.4 g/dL — ABNORMAL LOW (ref 11.6–15.9)
LYMPH%: 29.1 % (ref 14.0–49.7)
MCH: 32.1 pg (ref 25.1–34.0)
MCV: 97.7 fL (ref 79.5–101.0)
MONO%: 14.5 % — ABNORMAL HIGH (ref 0.0–14.0)
NEUT%: 55.5 % (ref 38.4–76.8)
Platelets: 190 10*3/uL (ref 145–400)

## 2011-11-12 MED ORDER — DEXAMETHASONE SODIUM PHOSPHATE 4 MG/ML IJ SOLN
20.0000 mg | Freq: Once | INTRAMUSCULAR | Status: AC
  Administered 2011-11-12: 20 mg via INTRAVENOUS

## 2011-11-12 MED ORDER — SODIUM CHLORIDE 0.9 % IV SOLN
600.0000 mg/m2 | Freq: Once | INTRAVENOUS | Status: AC
  Administered 2011-11-12: 1060 mg via INTRAVENOUS
  Filled 2011-11-12: qty 53

## 2011-11-12 MED ORDER — ONDANSETRON 16 MG/50ML IVPB (CHCC)
16.0000 mg | Freq: Once | INTRAVENOUS | Status: AC
  Administered 2011-11-12: 16 mg via INTRAVENOUS

## 2011-11-12 MED ORDER — HEPARIN SOD (PORK) LOCK FLUSH 100 UNIT/ML IV SOLN
500.0000 [IU] | Freq: Once | INTRAVENOUS | Status: AC | PRN
  Administered 2011-11-12: 500 [IU]
  Filled 2011-11-12: qty 5

## 2011-11-12 MED ORDER — SODIUM CHLORIDE 0.9 % IJ SOLN
10.0000 mL | INTRAMUSCULAR | Status: DC | PRN
  Administered 2011-11-12: 10 mL
  Filled 2011-11-12: qty 10

## 2011-11-12 MED ORDER — DOCETAXEL CHEMO INJECTION 160 MG/16ML
75.0000 mg/m2 | Freq: Once | INTRAVENOUS | Status: AC
  Administered 2011-11-12: 130 mg via INTRAVENOUS
  Filled 2011-11-12: qty 13

## 2011-11-12 MED ORDER — SODIUM CHLORIDE 0.9 % IV SOLN
Freq: Once | INTRAVENOUS | Status: AC
  Administered 2011-11-12: 14:00:00 via INTRAVENOUS

## 2011-11-12 NOTE — Patient Instructions (Signed)
[facility] Discharge Instructions for Patients Receiving Chemotherapy  Today you received the following chemotherapy agents taxotere, cytoxan   To help prevent nausea and vomiting after your treatment, we encourage you to take your nausea medication as prescribed by MD. Begin taking it at 8pm and take it as often as prescribed for the next 3 days.   If you develop nausea and vomiting that is not controlled by your nausea medication, call the clinic. If it is after clinic hours your family physician or the after hours number for the clinic or go to the Emergency Department.   BELOW ARE SYMPTOMS THAT SHOULD BE REPORTED IMMEDIATELY:  *FEVER GREATER THAN 100.5 F  *CHILLS WITH OR WITHOUT FEVER  NAUSEA AND VOMITING THAT IS NOT CONTROLLED WITH YOUR NAUSEA MEDICATION  *UNUSUAL SHORTNESS OF BREATH  *UNUSUAL BRUISING OR BLEEDING  TENDERNESS IN MOUTH AND THROAT WITH OR WITHOUT PRESENCE OF ULCERS  *URINARY PROBLEMS  *BOWEL PROBLEMS  UNUSUAL RASH Items with * indicate a potential emergency and should be followed up as soon as possible.  One of the nurses will contact you 24 hours after your treatment. Please let the nurse know about any problems that you may have experienced. Feel free to call the clinic you have any questions or concerns. The clinic phone number is [phone].   I have been informed and understand all the instructions given to me. I know to contact the clinic, my physician, or go to the Emergency Department if any problems should occur. I do not have any questions at this time, but understand that I may call the clinic during office hours   should I have any questions or need assistance in obtaining follow up care.    __________________________________________  _____________  __________ Signature of Patient or Authorized Representative            Date                   Time    __________________________________________ Nurse's Signature

## 2011-11-12 NOTE — Progress Notes (Signed)
Hematology and Oncology Follow Up Visit  Martha Pham [id number] 17-Apr-1951 61 y.o. 11/12/2011    HPI: Martha Pham is a 61 year old [place] woman with a history of a T2, N0, triple negative infiltrating ductal carcinoma of the left breast, for which she underwent an initial left lumpectomy with sentinel node dissection on 06/24/2011, followed by re-excision to positive margins, followed by a left simple mastectomy on 09/01/2011 due to positive margins. Following the mastectomy the margins were clear. She is currently day 1 cycle 3/4 planned adjuvant every 3 week Taxotere/Cytoxan with Neulasta support on day 2.  Interim History:   Martha Pham is seen today for followup prior to her third of 4 planned doses of every three-week Taxotere/Cytoxan being given in the adjuvant setting. She is feeling well today, denying any fevers, chills, night sweats, no shortness of breath or chest pain. She denies nausea, emesis, diarrhea or constipation issues. No hand-foot changes, and numbness or tingling of the fingertips and toes. She did not onset of some tearing this morning, she does have lubricating eyedrops which she will be utilizing. She's also been monitoring her blood pressures at home, and especially have been fairly normal. She's had no recurrent dizziness. She continues on her Hyzaar. Energy level is actually quite good. A detailed review of systems is otherwise noncontributory as noted below.  Review of Systems: Constitutional:  no weight loss, fever, night sweats and fatigued Eyes: dry eyes and uses contacts ENT: abit of taste change. Cardiovascular: no chest pain or dyspnea on exertion Respiratory: no cough, shortness of breath, or wheezing Neurological: no TIA or stroke symptoms Dermatological: negative Gastrointestinal: no abdominal pain, change in bowel habits, or black or bloody stools Genito-Urinary: no dysuria, trouble voiding, or hematuria Hematological and Lymphatic:  negative Breast: negative Musculoskeletal: positive for - pain in back - left, lower which is chronic in nature. Remaining ROS negative.   Medications:   I have reviewed the patient's current medications.  Current Outpatient Prescriptions  Medication Sig Dispense Refill  . atorvastatin (LIPITOR) 10 MG tablet Take 10 mg by mouth daily.        . calcium carbonate (OS-CAL) 600 MG TABS Take 600 mg by mouth 2 (two) times daily with a meal.        . Carboxymethylcellulose Sodium (REFRESH OP) Apply 1 drop to eye 2 (two) times daily as needed. For dry eyes      . dexamethasone (DECADRON) 4 MG tablet Take 2 tablets two times a day the day before Taxotere.  Then take 2 tabs two times a day starting the day after chemo for 3 days  30 tablet  1  . HYDROcodone-acetaminophen (NORCO) 5-325 MG per tablet as needed.      . lidocaine-prilocaine (EMLA) cream Apply topically as needed.  30 g  0  . losartan-hydrochlorothiazide (HYZAAR) 50-12.5 MG per tablet 1 tablet daily.       . Multiple Vitamin (MULTIVITAMIN) capsule Take 1 capsule by mouth daily.        . ondansetron (ZOFRAN) 8 MG tablet Take 1 tablet two times a day starting the day after chemo for 3 days.  Then take one tab two times a day as needed for nausea or vomiting.  30 tablet  1  . DISCONTD: MICARDIS HCT 40-12.5 MG per tablet Take 1 tablet by mouth Daily.        Allergies:  Allergies  Allergen Reactions  . Dust Mite Extract Itching  . Other     grass  Physical Exam: Filed Vitals:   11/12/11 1304  BP: 143/80  Pulse: 93  Temp: 97.6 F (36.4 C)    Body mass index is 33.76 kg/(m^2). Weight: 170 lbs. HEENT:  Sclerae anicteric, conjunctivae pink.  Oropharynx clear.  No mucositis or candidiasis.   Nodes:  No cervical, supraclavicular, or axillary lymphadenopathy palpated.  Breast Exam: Deferred  Lungs:  Clear to auscultation bilaterally.  No crackles, rhonchi, or wheezes.   Heart:  Regular rate and rhythm.   Abdomen:  Soft,  nontender.  Positive bowel sounds.  No organomegaly or masses palpated.   Musculoskeletal:  No focal spinal tenderness to palpation.  Extremities:  Benign.  No peripheral edema or cyanosis, slight hyperpigmentation of the cuticles but no frank nailbed dyscrasia. Skin:  Benign.   Neuro:  Nonfocal, alert and oriented x 3   Lab Results: Lab Results  Component Value Date   WBC 5.5 11/12/2011   HGB 11.4* 11/12/2011   HCT 34.7* 11/12/2011   MCV 97.7 11/12/2011   PLT 190 11/12/2011   NEUTROABS 3.1 11/12/2011     Chemistry      Component Value Date/Time   NA 142 10/29/2011 1501   K 4.0 10/29/2011 1501   CL 106 10/29/2011 1501   CO2 31 10/29/2011 1501   BUN 10 10/29/2011 1501   CREATININE 0.71 10/29/2011 1501      Component Value Date/Time   CALCIUM 9.6 10/29/2011 1501   ALKPHOS 98 10/29/2011 1501   AST 25 10/29/2011 1501   ALT 25 10/29/2011 1501   BILITOT 0.3 10/29/2011 1501      Lab Results  Component Value Date   LABCA2 33 06/11/2011     Assessment:  Martha Pham is a 61 year old [place] woman with a history of a T2, N0, triple negative infiltrating ductal carcinoma of the left breast, for which she underwent an initial left lumpectomy with sentinel node dissection on 06/24/2011, followed by re-excision to positive margins, followed by a left simple mastectomy on 09/01/2011 due to positive margins. Following the mastectomy the margins were clear. She is currently day 1 cycle 3/4 planned adjuvant every 3 week Taxotere/Cytoxan with Neulasta support on day 2.  Case reviewed with Dr. Pierce Crane.  Plan:  Martha Pham will receive day 1 cycle 3/4 planned q3 week doses of Taxotere/Cytoxan today as scheduled. She will return on day 2 for Neulasta providing granulocytes support. I will see her in one week's time for nadir assessment. This plan was reviewed with the patient, who voices understanding and agreement.  She knows to call with any changes or problems.    Amyah Clawson T,  PA-C 11/12/11

## 2011-11-13 ENCOUNTER — Ambulatory Visit (HOSPITAL_BASED_OUTPATIENT_CLINIC_OR_DEPARTMENT_OTHER): Payer: BC Managed Care – PPO

## 2011-11-13 VITALS — BP 151/78 | HR 76 | Temp 98.1°F

## 2011-11-13 DIAGNOSIS — C50219 Malignant neoplasm of upper-inner quadrant of unspecified female breast: Secondary | ICD-10-CM

## 2011-11-13 DIAGNOSIS — Z5189 Encounter for other specified aftercare: Secondary | ICD-10-CM

## 2011-11-13 MED ORDER — PEGFILGRASTIM INJECTION 6 MG/0.6ML
6.0000 mg | Freq: Once | SUBCUTANEOUS | Status: AC
  Administered 2011-11-13: 6 mg via SUBCUTANEOUS
  Filled 2011-11-13: qty 0.6

## 2011-11-14 ENCOUNTER — Telehealth: Payer: Self-pay | Admitting: *Deleted

## 2011-11-14 NOTE — Telephone Encounter (Signed)
Pt. Called and requested handicap placard and she would like to come today to pick it up..  Discussed with Debbora Presto PA.  Signed application and called pt. To let her she can pick this up anytime today.

## 2011-11-17 ENCOUNTER — Encounter: Payer: Self-pay | Admitting: *Deleted

## 2011-11-17 NOTE — Progress Notes (Signed)
Pt reports that her 63 y/o grandson is currently being tx w/tamiflu for flu sx. Per MD, stay away from grandson as much as possible and report s/s of flu to this desk. Pt is to see CS 11/19/11

## 2011-11-18 ENCOUNTER — Other Ambulatory Visit: Payer: Self-pay | Admitting: Physician Assistant

## 2011-11-18 ENCOUNTER — Encounter: Payer: Self-pay | Admitting: Oncology

## 2011-11-18 DIAGNOSIS — C50219 Malignant neoplasm of upper-inner quadrant of unspecified female breast: Secondary | ICD-10-CM

## 2011-11-18 NOTE — Progress Notes (Signed)
Put [employer] disability form on nurse's desk. °

## 2011-11-19 ENCOUNTER — Other Ambulatory Visit (HOSPITAL_BASED_OUTPATIENT_CLINIC_OR_DEPARTMENT_OTHER): Payer: BC Managed Care – PPO | Admitting: Lab

## 2011-11-19 ENCOUNTER — Encounter: Payer: Self-pay | Admitting: Oncology

## 2011-11-19 ENCOUNTER — Ambulatory Visit (HOSPITAL_BASED_OUTPATIENT_CLINIC_OR_DEPARTMENT_OTHER): Payer: BC Managed Care – PPO | Admitting: Physician Assistant

## 2011-11-19 VITALS — BP 152/75 | HR 118 | Temp 98.3°F | Ht 59.5 in | Wt 170.4 lb

## 2011-11-19 DIAGNOSIS — C50919 Malignant neoplasm of unspecified site of unspecified female breast: Secondary | ICD-10-CM

## 2011-11-19 DIAGNOSIS — C50219 Malignant neoplasm of upper-inner quadrant of unspecified female breast: Secondary | ICD-10-CM

## 2011-11-19 LAB — CBC WITH DIFFERENTIAL/PLATELET
Basophils Absolute: 0 10*3/uL (ref 0.0–0.1)
Eosinophils Absolute: 0.1 10*3/uL (ref 0.0–0.5)
HGB: 10.7 g/dL — ABNORMAL LOW (ref 11.6–15.9)
MCV: 100.7 fL (ref 79.5–101.0)
MONO%: 14.8 % — ABNORMAL HIGH (ref 0.0–14.0)
NEUT#: 6.5 10*3/uL (ref 1.5–6.5)
RDW: 13.9 % (ref 11.2–14.5)
lymph#: 2.1 10*3/uL (ref 0.9–3.3)

## 2011-11-19 NOTE — Progress Notes (Signed)
Faxed disability form to [employer] @ [phone] °

## 2011-11-19 NOTE — Progress Notes (Signed)
Hematology and Oncology Follow Up Visit  TENASIA AULL [id number] [birth year] 61 y.o. 11/19/2011    HPI: Ms. Knouff is a 61 year old [place] woman with a history of a T2, N0, triple negative infiltrating ductal carcinoma of the left breast, for which she underwent an initial left lumpectomy with sentinel node dissection on 06/24/2011, followed by re-excision to positive margins, followed by a left simple mastectomy on 09/01/2011 due to positive margins. Following the mastectomy the margins were clear. She is currently day 7 cycle 3/4 planned adjuvant every 3 week Taxotere/Cytoxan with Neulasta support on day 2.  Interim History:   Ericia is seen today for followup after her third of 4 planned cycles of every 3 week Taxotere/Cytoxan given in the adjuvant setting for her history of a stage II, T2 N0, triple negative left breast carcinoma. She is feeling well, except she has developed some "cold" symptoms, but denying any unexplained fevers, chills, or night sweats. She denies any persistent nausea issues, no diarrhea, or unusual constipation though she did experience some constipation for the first couple of days following her treatment. She received Neulasta on day 2, and has experienced some flare of chronic low back pain with radiation to the left hip and left leg, but again this is chronic. She also has experienced some sternal pain intermittently, but denies frank heartburn. She has had some change in her sense of taste but denies any frank mouth sores. She denies any increased eye dryness. She denies any frank neuropathy symptoms that she did have a little sensitivity of her left hand which was transient. She denies any skin changes.  A detailed review of systems is otherwise noncontributory as noted below.  Review of Systems: Constitutional:  no weight loss, fever, night sweats and fatigued Eyes: dry eyes and uses contacts ENT: abit of taste change. Cardiovascular: no chest pain  or dyspnea on exertion Respiratory: no cough, shortness of breath, or wheezing Neurological: no TIA or stroke symptoms Dermatological: negative Gastrointestinal: no abdominal pain, change in bowel habits, or black or bloody stools Genito-Urinary: no dysuria, trouble voiding, or hematuria Hematological and Lymphatic: negative Breast: negative Musculoskeletal: positive for - pain in back - left, lower which is chronic in nature. Remaining ROS negative.   Medications:   I have reviewed the patient's current medications.  Current Outpatient Prescriptions  Medication Sig Dispense Refill  . atorvastatin (LIPITOR) 10 MG tablet Take 10 mg by mouth daily.        . calcium carbonate (OS-CAL) 600 MG TABS Take 600 mg by mouth 2 (two) times daily with a meal.        . Carboxymethylcellulose Sodium (REFRESH OP) Apply 1 drop to eye 2 (two) times daily as needed. For dry eyes      . dexamethasone (DECADRON) 4 MG tablet Take 2 tablets two times a day the day before Taxotere.  Then take 2 tabs two times a day starting the day after chemo for 3 days  30 tablet  1  . HYDROcodone-acetaminophen (NORCO) 5-325 MG per tablet as needed.      . lidocaine-prilocaine (EMLA) cream Apply topically as needed.  30 g  0  . losartan-hydrochlorothiazide (HYZAAR) 50-12.5 MG per tablet 1 tablet daily.       . Multiple Vitamin (MULTIVITAMIN) capsule Take 1 capsule by mouth daily.        . ondansetron (ZOFRAN) 8 MG tablet Take 1 tablet two times a day starting the day after chemo for 3 days.  Then take one tab two times a day as needed for nausea or vomiting.  30 tablet  1  . DISCONTD: MICARDIS HCT 40-12.5 MG per tablet Take 1 tablet by mouth Daily.        Allergies:  Allergies  Allergen Reactions  . Dust Mite Extract Itching  . Other     grass    Physical Exam: Filed Vitals:   11/19/11 1035  BP: 152/75  Pulse: 118  Temp: 98.3 F (36.8 C)    Body mass index is 33.84 kg/(m^2). Weight: 170 lbs. HEENT:  Sclerae  anicteric, conjunctivae pink.  Oropharynx clear.  No mucositis or candidiasis.   Nodes:  No cervical, supraclavicular, or axillary lymphadenopathy palpated.  Breast Exam: Deferred  Lungs:  Clear to auscultation bilaterally.  No crackles, rhonchi, or wheezes.   Heart:  Regular rate and rhythm.   Abdomen:  Soft, nontender.  Positive bowel sounds.  No organomegaly or masses palpated.   Musculoskeletal:  No focal spinal tenderness to palpation.  Extremities:  Benign.  No peripheral edema or cyanosis.   Skin:  Benign.   Neuro:  Nonfocal, alert and oriented x 3   Lab Results: Lab Results  Component Value Date   WBC 10.3 11/19/2011   HGB 10.7* 11/19/2011   HCT 32.8* 11/19/2011   MCV 100.7 11/19/2011   PLT 145 11/19/2011   NEUTROABS 6.5 11/19/2011     Chemistry      Component Value Date/Time   NA 140 11/12/2011 1242   K 3.7 11/12/2011 1242   CL 107 11/12/2011 1242   CO2 24 11/12/2011 1242   BUN 9 11/12/2011 1242   CREATININE 0.63 11/12/2011 1242      Component Value Date/Time   CALCIUM 8.9 11/12/2011 1242   ALKPHOS 60 11/12/2011 1242   AST 19 11/12/2011 1242   ALT 25 11/12/2011 1242   BILITOT 0.3 11/12/2011 1242      Lab Results  Component Value Date   LABCA2 33 06/11/2011     Assessment:  Ms. Michie is a 61 year old [place] woman with a history of a T2, N0, triple negative infiltrating ductal carcinoma of the left breast, for which she underwent an initial left lumpectomy with sentinel node dissection on 06/24/2011, followed by re-excision to positive margins, followed by a left simple mastectomy on 09/01/2011 due to positive margins. Following the mastectomy the margins were clear. She is currently day 7 cycle 3/4 planned adjuvant every 3 week Taxotere/Cytoxan with Neulasta support on day 2.  Case reviewed with Dr. Pierce Crane.  Plan:  Kadia is doing well.  I will see her in 2 weeks' time, specifically a 12/03/11 prior to day 1 cycle 4/4 of her Taxotere/Cytoxan regimen. She  does be be happy to see her in the interim if the need should arise.  This plan was reviewed with the patient, who voices understanding and agreement.  She knows to call with any changes or problems.    Avarose Mervine T, PA-C 11/19/2011

## 2011-12-03 ENCOUNTER — Ambulatory Visit (HOSPITAL_BASED_OUTPATIENT_CLINIC_OR_DEPARTMENT_OTHER): Payer: BC Managed Care – PPO

## 2011-12-03 ENCOUNTER — Other Ambulatory Visit (HOSPITAL_BASED_OUTPATIENT_CLINIC_OR_DEPARTMENT_OTHER): Payer: BC Managed Care – PPO

## 2011-12-03 ENCOUNTER — Encounter: Payer: Self-pay | Admitting: Physician Assistant

## 2011-12-03 ENCOUNTER — Ambulatory Visit (HOSPITAL_BASED_OUTPATIENT_CLINIC_OR_DEPARTMENT_OTHER): Payer: BC Managed Care – PPO | Admitting: Physician Assistant

## 2011-12-03 VITALS — BP 127/81 | HR 101 | Temp 98.0°F | Ht 59.5 in | Wt 168.3 lb

## 2011-12-03 DIAGNOSIS — C50219 Malignant neoplasm of upper-inner quadrant of unspecified female breast: Secondary | ICD-10-CM

## 2011-12-03 DIAGNOSIS — Z5111 Encounter for antineoplastic chemotherapy: Secondary | ICD-10-CM

## 2011-12-03 LAB — COMPREHENSIVE METABOLIC PANEL
ALT: 19 U/L (ref 0–35)
CO2: 27 mEq/L (ref 19–32)
Calcium: 9.2 mg/dL (ref 8.4–10.5)
Chloride: 103 mEq/L (ref 96–112)
Creatinine, Ser: 0.7 mg/dL (ref 0.50–1.10)
Glucose, Bld: 106 mg/dL — ABNORMAL HIGH (ref 70–99)
Sodium: 138 mEq/L (ref 135–145)
Total Protein: 6.3 g/dL (ref 6.0–8.3)

## 2011-12-03 LAB — CBC WITH DIFFERENTIAL/PLATELET
Basophils Absolute: 0 10*3/uL (ref 0.0–0.1)
Eosinophils Absolute: 0.1 10*3/uL (ref 0.0–0.5)
HGB: 11.1 g/dL — ABNORMAL LOW (ref 11.6–15.9)
LYMPH%: 20.9 % (ref 14.0–49.7)
MCV: 99.4 fL (ref 79.5–101.0)
MONO#: 0.7 10*3/uL (ref 0.1–0.9)
MONO%: 15.8 % — ABNORMAL HIGH (ref 0.0–14.0)
NEUT#: 2.8 10*3/uL (ref 1.5–6.5)
Platelets: 222 10*3/uL (ref 145–400)
RBC: 3.43 10*6/uL — ABNORMAL LOW (ref 3.70–5.45)
WBC: 4.6 10*3/uL (ref 3.9–10.3)
nRBC: 0 % (ref 0–0)

## 2011-12-03 LAB — LACTATE DEHYDROGENASE: LDH: 230 U/L (ref 94–250)

## 2011-12-03 MED ORDER — SODIUM CHLORIDE 0.9 % IV SOLN
Freq: Once | INTRAVENOUS | Status: AC
  Administered 2011-12-03: 14:00:00 via INTRAVENOUS

## 2011-12-03 MED ORDER — ONDANSETRON 16 MG/50ML IVPB (CHCC)
16.0000 mg | Freq: Once | INTRAVENOUS | Status: AC
  Administered 2011-12-03: 16 mg via INTRAVENOUS

## 2011-12-03 MED ORDER — SODIUM CHLORIDE 0.9 % IV SOLN
600.0000 mg/m2 | Freq: Once | INTRAVENOUS | Status: AC
  Administered 2011-12-03: 1060 mg via INTRAVENOUS
  Filled 2011-12-03: qty 53

## 2011-12-03 MED ORDER — SODIUM CHLORIDE 0.9 % IJ SOLN
10.0000 mL | INTRAMUSCULAR | Status: DC | PRN
  Administered 2011-12-03: 10 mL
  Filled 2011-12-03: qty 10

## 2011-12-03 MED ORDER — DOCETAXEL CHEMO INJECTION 160 MG/16ML
75.0000 mg/m2 | Freq: Once | INTRAVENOUS | Status: AC
  Administered 2011-12-03: 130 mg via INTRAVENOUS
  Filled 2011-12-03: qty 13

## 2011-12-03 MED ORDER — HEPARIN SOD (PORK) LOCK FLUSH 100 UNIT/ML IV SOLN
500.0000 [IU] | Freq: Once | INTRAVENOUS | Status: AC | PRN
  Administered 2011-12-03: 500 [IU]
  Filled 2011-12-03: qty 5

## 2011-12-03 MED ORDER — DEXAMETHASONE SODIUM PHOSPHATE 4 MG/ML IJ SOLN
20.0000 mg | Freq: Once | INTRAMUSCULAR | Status: AC
  Administered 2011-12-03: 20 mg via INTRAVENOUS

## 2011-12-03 NOTE — Patient Instructions (Signed)
[facility] Discharge Instructions for Patients Receiving Chemotherapy  Today you received the following chemotherapy agents Taxotere & Cytoxan To help prevent nausea and vomiting after your treatment, we encourage you to take your nausea medication as directed by your physician  If you develop nausea and vomiting that is not controlled by your nausea medication, call the clinic. If it is after clinic hours your family physician or the after hours number for the clinic or go to the Emergency Department.   BELOW ARE SYMPTOMS THAT SHOULD BE REPORTED IMMEDIATELY:  *FEVER GREATER THAN 100.5 F  *CHILLS WITH OR WITHOUT FEVER  NAUSEA AND VOMITING THAT IS NOT CONTROLLED WITH YOUR NAUSEA MEDICATION  *UNUSUAL SHORTNESS OF BREATH  *UNUSUAL BRUISING OR BLEEDING  TENDERNESS IN MOUTH AND THROAT WITH OR WITHOUT PRESENCE OF ULCERS  *URINARY PROBLEMS  *BOWEL PROBLEMS  UNUSUAL RASH Items with * indicate a potential emergency and should be followed up as soon as possible.  One of the nurses will contact you 24 hours after your treatment. Please let the nurse know about any problems that you may have experienced. Feel free to call the clinic you have any questions or concerns. The clinic phone number is [phone].   I have been informed and understand all the instructions given to me. I know to contact the clinic, my physician, or go to the Emergency Department if any problems should occur. I do not have any questions at this time, but understand that I may call the clinic during office hours   should I have any questions or need assistance in obtaining follow up care.    __________________________________________  _____________  __________ Signature of Patient or Authorized Representative            Date                   Time    __________________________________________ Nurse's Signature

## 2011-12-03 NOTE — Progress Notes (Signed)
Hematology and Oncology Follow Up Visit  Martha Pham [id number] [birth year] 61 y.o. 12/03/2011    HPI: Martha Pham is a 61 year old [place] woman with a history of a T2, N0, triple negative infiltrating ductal carcinoma of the left breast, for which she underwent an initial left lumpectomy with sentinel node dissection on 06/24/2011, followed by re-excision to positive margins, followed by a left simple mastectomy on 09/01/2011 due to positive margins. Following the mastectomy the margins were clear. She is currently day 1 cycle 4/4 planned adjuvant every 3 week Taxotere/Cytoxan with Neulasta support on day 2.  Interim History:   Martha Pham is seen today for followup prior to her fourth of 4 planned doses of every three-week Taxotere/Cytoxan being given in the adjuvant setting. She is feeling well today, denying any fevers, chills, night sweats, no shortness of breath or chest pain. She denies nausea, emesis, diarrhea or constipation issues. She denies any frank neuropathy symptoms.  Energy level is a bit decreased, but she is able to do her daily activities. \ A detailed review of systems is otherwise noncontributory as noted below.  Review of Systems: Constitutional:  no weight loss, fever, night sweats and fatigued Eyes: dry eyes and uses contacts ENT: abit of taste change. Cardiovascular: no chest pain or dyspnea on exertion Respiratory: no cough, shortness of breath, or wheezing Neurological: no TIA or stroke symptoms Dermatological: negative Gastrointestinal: no abdominal pain, change in bowel habits, or black or bloody stools Genito-Urinary: no dysuria, trouble voiding, or hematuria Hematological and Lymphatic: negative Breast: negative Musculoskeletal: positive for - pain in back - left, lower which is chronic in nature. Remaining ROS negative.   Medications:   I have reviewed the patient's current medications.  Current Outpatient Prescriptions  Medication Sig  Dispense Refill  . atorvastatin (LIPITOR) 10 MG tablet Take 10 mg by mouth daily.        . calcium carbonate (OS-CAL) 600 MG TABS Take 600 mg by mouth 2 (two) times daily with a meal.        . Carboxymethylcellulose Sodium (REFRESH OP) Apply 1 drop to eye 2 (two) times daily as needed. For dry eyes      . dexamethasone (DECADRON) 4 MG tablet Take 2 tablets two times a day the day before Taxotere.  Then take 2 tabs two times a day starting the day after chemo for 3 days  30 tablet  1  . HYDROcodone-acetaminophen (NORCO) 5-325 MG per tablet as needed.      . lidocaine-prilocaine (EMLA) cream Apply topically as needed.  30 g  0  . losartan-hydrochlorothiazide (HYZAAR) 50-12.5 MG per tablet 1 tablet daily.       . Multiple Vitamin (MULTIVITAMIN) capsule Take 1 capsule by mouth daily.        . ondansetron (ZOFRAN) 8 MG tablet Take 1 tablet two times a day starting the day after chemo for 3 days.  Then take one tab two times a day as needed for nausea or vomiting.  30 tablet  1  . DISCONTD: MICARDIS HCT 40-12.5 MG per tablet Take 1 tablet by mouth Daily.        Allergies:  Allergies  Allergen Reactions  . Dust Mite Extract Itching  . Other     grass    Physical Exam: Filed Vitals:   12/03/11 1335  BP: 127/81  Pulse: 101  Temp: 98 F (36.7 C)    Body mass index is 33.42 kg/(m^2). Weight: 168 lbs. HEENT:  Sclerae anicteric,  conjunctivae pink.  Oropharynx clear.  No mucositis or candidiasis.   Nodes:  No cervical, supraclavicular, or axillary lymphadenopathy palpated.  Breast Exam: Deferred  Lungs:  Clear to auscultation bilaterally.  No crackles, rhonchi, or wheezes.   Heart:  Regular rate and rhythm.   Abdomen:  Soft, nontender.  Positive bowel sounds.  No organomegaly or masses palpated.   Musculoskeletal:  No focal spinal tenderness to palpation.  Extremities:  Benign.  No peripheral edema or cyanosis, slight hyperpigmentation of the cuticles but no frank nailbed dyscrasia. Skin:   Benign.   Neuro:  Nonfocal, alert and oriented x 3   Lab Results: Lab Results  Component Value Date   WBC 4.6 12/03/2011   HGB 11.1* 12/03/2011   HCT 34.1* 12/03/2011   MCV 99.4 12/03/2011   PLT 222 12/03/2011   NEUTROABS 2.8 12/03/2011     Chemistry      Component Value Date/Time   NA 140 11/12/2011 1242   K 3.7 11/12/2011 1242   CL 107 11/12/2011 1242   CO2 24 11/12/2011 1242   BUN 9 11/12/2011 1242   CREATININE 0.63 11/12/2011 1242      Component Value Date/Time   CALCIUM 8.9 11/12/2011 1242   ALKPHOS 60 11/12/2011 1242   AST 19 11/12/2011 1242   ALT 25 11/12/2011 1242   BILITOT 0.3 11/12/2011 1242      Lab Results  Component Value Date   LABCA2 33 06/11/2011     Assessment:  Martha Pham is a 61 year old [place] woman with a history of a T2, N0, triple negative infiltrating ductal carcinoma of the left breast, for which she underwent an initial left lumpectomy with sentinel node dissection on 06/24/2011, followed by re-excision to positive margins, followed by a left simple mastectomy on 09/01/2011 due to positive margins. Following the mastectomy the margins were clear. She is currently day 1 cycle 4/4 planned adjuvant every 3 week Taxotere/Cytoxan with Neulasta support on day 2.  Case reviewed with Dr. Pierce Crane.  Plan:  Martha Pham will receive day 1 cycle 4/4 planned q3 week doses of Taxotere/Cytoxan today as scheduled. She will return on day 2 for Neulasta providing granulocytes support. I will see her in one week's time for nadir assessment. This plan was reviewed with the patient, who voices understanding and agreement.  She knows to call with any changes or problems.    Quillan Whitter T, PA-C 12/03/11

## 2011-12-04 ENCOUNTER — Ambulatory Visit (HOSPITAL_BASED_OUTPATIENT_CLINIC_OR_DEPARTMENT_OTHER): Payer: BC Managed Care – PPO

## 2011-12-04 VITALS — BP 117/72 | HR 87 | Temp 97.2°F

## 2011-12-04 DIAGNOSIS — C50219 Malignant neoplasm of upper-inner quadrant of unspecified female breast: Secondary | ICD-10-CM

## 2011-12-04 DIAGNOSIS — Z5189 Encounter for other specified aftercare: Secondary | ICD-10-CM

## 2011-12-04 MED ORDER — PEGFILGRASTIM INJECTION 6 MG/0.6ML
6.0000 mg | Freq: Once | SUBCUTANEOUS | Status: AC
  Administered 2011-12-04: 6 mg via SUBCUTANEOUS
  Filled 2011-12-04: qty 0.6

## 2011-12-04 NOTE — Patient Instructions (Signed)
Call MD for problems 

## 2011-12-09 ENCOUNTER — Other Ambulatory Visit: Payer: Self-pay | Admitting: Physician Assistant

## 2011-12-09 DIAGNOSIS — C50219 Malignant neoplasm of upper-inner quadrant of unspecified female breast: Secondary | ICD-10-CM

## 2011-12-10 ENCOUNTER — Other Ambulatory Visit (HOSPITAL_BASED_OUTPATIENT_CLINIC_OR_DEPARTMENT_OTHER): Payer: BC Managed Care – PPO | Admitting: Lab

## 2011-12-10 ENCOUNTER — Ambulatory Visit (HOSPITAL_BASED_OUTPATIENT_CLINIC_OR_DEPARTMENT_OTHER): Payer: BC Managed Care – PPO | Admitting: Physician Assistant

## 2011-12-10 ENCOUNTER — Telehealth: Payer: Self-pay | Admitting: *Deleted

## 2011-12-10 ENCOUNTER — Encounter: Payer: Self-pay | Admitting: Physician Assistant

## 2011-12-10 VITALS — BP 117/70 | HR 108 | Temp 98.4°F | Ht 59.5 in | Wt 170.0 lb

## 2011-12-10 DIAGNOSIS — C50219 Malignant neoplasm of upper-inner quadrant of unspecified female breast: Secondary | ICD-10-CM

## 2011-12-10 LAB — CBC WITH DIFFERENTIAL/PLATELET
BASO%: 0.6 % (ref 0.0–2.0)
EOS%: 0.7 % (ref 0.0–7.0)
Eosinophils Absolute: 0.1 10*3/uL (ref 0.0–0.5)
HGB: 10.5 g/dL — ABNORMAL LOW (ref 11.6–15.9)
LYMPH%: 15.4 % (ref 14.0–49.7)
MONO#: 1.5 10*3/uL — ABNORMAL HIGH (ref 0.1–0.9)
MONO%: 17.5 % — ABNORMAL HIGH (ref 0.0–14.0)
NEUT#: 5.6 10*3/uL (ref 1.5–6.5)
NEUT%: 65.8 % (ref 38.4–76.8)
Platelets: 163 10*3/uL (ref 145–400)
RBC: 3.3 10*6/uL — ABNORMAL LOW (ref 3.70–5.45)
RDW: 13.8 % (ref 11.2–14.5)
WBC: 8.5 10*3/uL (ref 3.9–10.3)

## 2011-12-10 NOTE — Patient Instructions (Signed)
1. You may use OTC Pepcid AC, take as directed.

## 2011-12-10 NOTE — Progress Notes (Signed)
Hematology and Oncology Follow Up Visit  JAICEE MICHELOTTI [id number] 1950-09-30 61 y.o. 12/10/2011    HPI: Ms. Kulkarni is a 61 year old [place] woman with a history of a T2, N0, triple negative infiltrating ductal carcinoma of the left breast, for which she underwent an initial left lumpectomy with sentinel node dissection on 06/24/2011, followed by re-excision to positive margins, followed by a left simple mastectomy on 09/01/2011 due to positive margins. Following the mastectomy the margins were clear. She is currently day 7 cycle 4/4 planned adjuvant every 3 week Taxotere/Cytoxan with Neulasta support on day 2.  Interim History:   Kasy is seen today for followup after her fourth and final cycle of every 3 week Taxotere/Cytoxan given in the adjuvant setting for her history of a stage II, T2 N0, triple negative left breast carcinoma. She is feeling well, but fatigued. She gets short of breath with exertion. O2 sat is 100%. She denies any productive cough. She denies fevers, chills or night sweats. No persistent nausea, emesis, diarrhea or constipation issues. No neuropathy or excessive eye tearing. No bleeding or bruising symptoms.  A detailed review of systems is otherwise noncontributory as noted below.  Review of Systems: Constitutional:  no weight loss, fever, night sweats and fatigued Eyes: dry eyes and uses contacts ENT: abit of taste change. Cardiovascular: no chest pain or dyspnea on exertion Respiratory: no cough, shortness of breath, or wheezing Neurological: no TIA or stroke symptoms Dermatological: negative Gastrointestinal: no abdominal pain, change in bowel habits, or black or bloody stools Genito-Urinary: no dysuria, trouble voiding, or hematuria Hematological and Lymphatic: negative Breast: negative Musculoskeletal: positive for - pain in back - left, lower which is chronic in nature. Remaining ROS negative.   Medications:   I have reviewed the patient's  current medications.  Current Outpatient Prescriptions  Medication Sig Dispense Refill  . atorvastatin (LIPITOR) 10 MG tablet Take 10 mg by mouth daily.        . calcium carbonate (OS-CAL) 600 MG TABS Take 600 mg by mouth 2 (two) times daily with a meal.        . Carboxymethylcellulose Sodium (REFRESH OP) Apply 1 drop to eye 2 (two) times daily as needed. For dry eyes      . HYDROcodone-acetaminophen (NORCO) 5-325 MG per tablet as needed.      Marland Kitchen losartan-hydrochlorothiazide (HYZAAR) 50-12.5 MG per tablet 1 tablet daily.       . Multiple Vitamin (MULTIVITAMIN) capsule Take 1 capsule by mouth daily.        . ondansetron (ZOFRAN) 8 MG tablet Take 1 tablet two times a day starting the day after chemo for 3 days.  Then take one tab two times a day as needed for nausea or vomiting.  30 tablet  1  . dexamethasone (DECADRON) 4 MG tablet Take 2 tablets two times a day the day before Taxotere.  Then take 2 tabs two times a day starting the day after chemo for 3 days  30 tablet  1  . lidocaine-prilocaine (EMLA) cream Apply topically as needed.  30 g  0  . DISCONTD: MICARDIS HCT 40-12.5 MG per tablet Take 1 tablet by mouth Daily.        Allergies:  Allergies  Allergen Reactions  . Dust Mite Extract Itching  . Other     grass    Physical Exam: Filed Vitals:   12/10/11 0954  BP: 117/70  Pulse: 108  Temp: 98.4 F (36.9 C)    Body  mass index is 33.76 kg/(m^2). Weight: 170 lbs. HEENT:  Sclerae anicteric, conjunctivae pink.  Oropharynx clear.  No mucositis or candidiasis.   Nodes:  No cervical, supraclavicular, or axillary lymphadenopathy palpated.  Breast Exam: Left mastectomy scar is benign with no evidence of any dominant mass effect. No evidence of skin nodules or changes. Lungs:  Clear to auscultation bilaterally.  No crackles, rhonchi, or wheezes.   Heart:  Regular rate and rhythm.   Abdomen:  Soft, nontender.  Positive bowel sounds.  No organomegaly or masses palpated.   Musculoskeletal:   No focal spinal tenderness to palpation.  Extremities:  Benign.  No peripheral edema or cyanosis.   Skin:  Benign.   Neuro:  Nonfocal, alert and oriented x 3   Lab Results: Lab Results  Component Value Date   WBC 8.5 12/10/2011   HGB 10.5* 12/10/2011   HCT 32.4* 12/10/2011   MCV 98.2 12/10/2011   PLT 163 12/10/2011   NEUTROABS 5.6 12/10/2011     Chemistry      Component Value Date/Time   NA 138 12/03/2011 1324   K 3.7 12/03/2011 1324   CL 103 12/03/2011 1324   CO2 27 12/03/2011 1324   BUN 17 12/03/2011 1324   CREATININE 0.70 12/03/2011 1324      Component Value Date/Time   CALCIUM 9.2 12/03/2011 1324   ALKPHOS 60 12/03/2011 1324   AST 17 12/03/2011 1324   ALT 19 12/03/2011 1324   BILITOT 0.3 12/03/2011 1324      Lab Results  Component Value Date   LABCA2 33 06/11/2011     Assessment:  Ms. Swilling is a 61 year old [place] woman with a history of a T2, N0, triple negative infiltrating ductal carcinoma of the left breast, for which she underwent an initial left lumpectomy with sentinel node dissection on 06/24/2011, followed by re-excision to positive margins, followed by a left simple mastectomy on 09/01/2011 due to positive margins. Following the mastectomy the margins were clear. She is currently day 7 cycle 4/4 planned adjuvant every 3 week Taxotere/Cytoxan with Neulasta support on day 2.  Case reviewed with Dr. Pierce Crane, who also spoke with patient.  Plan:  Adrianah is doing well. She will return in 6 weeks for followup with Dr. Donnie Coffin with appropriate pre-labs. Will refer her back to Dr. Biagio Quint for port removal, she'll be undergoing her breast reconstruction under the care of Dr. Kelly Splinter.  She does be be happy to see her in the interim if the need should arise.  This plan was reviewed with the patient, who voices understanding and agreement.  She knows to call with any changes or problems.    Juanita Streight T, PA-C 12/10/2011

## 2011-12-10 NOTE — Telephone Encounter (Signed)
gave patient appointment for dr.layton on 12-18-2011 at 10:30am gave patient appointment for 02-11-2012 starting at 1:00pm printed out calendar and gave to the patient

## 2011-12-18 ENCOUNTER — Ambulatory Visit (INDEPENDENT_AMBULATORY_CARE_PROVIDER_SITE_OTHER): Payer: BC Managed Care – PPO | Admitting: General Surgery

## 2011-12-18 VITALS — BP 158/70 | HR 108 | Temp 97.4°F | Resp 16 | Ht 59.0 in | Wt 167.6 lb

## 2011-12-18 DIAGNOSIS — Z853 Personal history of malignant neoplasm of breast: Secondary | ICD-10-CM

## 2011-12-18 NOTE — Progress Notes (Signed)
Subjective:     Patient ID: Martha Pham, female   DOB: June 13, 1951, 61 y.o.   MRN: [id number]  HPI This patient follows up after left mastectomy and sentinel lymph node biopsy for triple negative left breast cancer. She has completed her adjuvant chemotherapy approximately 2 weeks ago. She is doing very well except she has a cough and she complains of some generalized lack of energy. She denies any fevers or chills and her cough is nonproductive. She is still interested in breast reconstruction. She has already been evaluated by Dr. Kelly Splinter  Review of Systems     Objective:   Physical Exam No distress and nontoxic-appearing  Her right breast is normal in appearance and on exam. I cannot appreciate any palpable masses, skin changes, or lymphadenopathy. Her right breast shows postoperative surgical changes with some redundant skin but no nodularity or palpable masses along her mastectomy scar or in her axilla    Assessment:     Status post left mastectomy-doing well I think she is doing very well for this stage postoperatively. She has now completed her adjuvant chemotherapy and other than what I think is appropriate fatigue, and she has no other complaints. If it's okay with her oncologist, and I am okay removing her port at any time. I recommended that she discussed reconstruction with Dr. Kelly Splinter  and if she is going to do reconstruction and a breast reduction on the right, and then if she can remove the port at that time or I will be available to remove it well she is already sedated. Otherwise, there is no evidence of any recurrence and she seems to be doing well. I did recommend that she continue with SBE on both sides.    Plan:     Monthly SBE  Repeat mammogram in 4 months and f/u after this. If she is going to have breast reduction, then she should have her mammo prior to this.  I will see her back after her mammo.

## 2011-12-30 ENCOUNTER — Other Ambulatory Visit: Payer: Self-pay | Admitting: *Deleted

## 2011-12-30 MED ORDER — MAGIC MOUTHWASH W/LIDOCAINE: 10.0000 mL | Freq: Four times a day (QID) | ORAL | Status: DC | PRN

## 2012-02-11 ENCOUNTER — Encounter: Payer: Self-pay | Admitting: Oncology

## 2012-02-11 ENCOUNTER — Telehealth: Payer: Self-pay | Admitting: Oncology

## 2012-02-11 ENCOUNTER — Ambulatory Visit (HOSPITAL_COMMUNITY)
Admission: RE | Admit: 2012-02-11 | Discharge: 2012-02-11 | Disposition: A | Discharge status: Home / Self Care | Payer: BC Managed Care – PPO | Source: Ambulatory Visit | Attending: Oncology | Admitting: Oncology

## 2012-02-11 ENCOUNTER — Other Ambulatory Visit (HOSPITAL_BASED_OUTPATIENT_CLINIC_OR_DEPARTMENT_OTHER): Payer: BC Managed Care – PPO | Admitting: Lab

## 2012-02-11 ENCOUNTER — Ambulatory Visit (HOSPITAL_BASED_OUTPATIENT_CLINIC_OR_DEPARTMENT_OTHER): Payer: BC Managed Care – PPO | Admitting: Oncology

## 2012-02-11 VITALS — BP 148/76 | HR 85 | Temp 98.4°F | Ht 59.0 in | Wt 169.1 lb

## 2012-02-11 DIAGNOSIS — C50219 Malignant neoplasm of upper-inner quadrant of unspecified female breast: Secondary | ICD-10-CM

## 2012-02-11 DIAGNOSIS — M545 Low back pain, unspecified: Secondary | ICD-10-CM | POA: Insufficient documentation

## 2012-02-11 DIAGNOSIS — M47817 Spondylosis without myelopathy or radiculopathy, lumbosacral region: Secondary | ICD-10-CM | POA: Insufficient documentation

## 2012-02-11 DIAGNOSIS — Z853 Personal history of malignant neoplasm of breast: Secondary | ICD-10-CM | POA: Insufficient documentation

## 2012-02-11 LAB — CBC WITH DIFFERENTIAL/PLATELET
Basophils Absolute: 0 10*3/uL (ref 0.0–0.1)
Eosinophils Absolute: 0.1 10*3/uL (ref 0.0–0.5)
HCT: 38.4 % (ref 34.8–46.6)
HGB: 12.6 g/dL (ref 11.6–15.9)
MCV: 94.4 fL (ref 79.5–101.0)
MONO%: 11.4 % (ref 0.0–14.0)
NEUT#: 1.6 10*3/uL (ref 1.5–6.5)
RDW: 14.3 % (ref 11.2–14.5)

## 2012-02-11 LAB — COMPREHENSIVE METABOLIC PANEL
Albumin: 4.2 g/dL (ref 3.5–5.2)
Alkaline Phosphatase: 64 U/L (ref 39–117)
BUN: 14 mg/dL (ref 6–23)
Calcium: 9.6 mg/dL (ref 8.4–10.5)
Chloride: 110 mEq/L (ref 96–112)
Glucose, Bld: 95 mg/dL (ref 70–99)
Potassium: 3.8 mEq/L (ref 3.5–5.3)

## 2012-02-11 LAB — CANCER ANTIGEN 27.29: CA 27.29: 36 U/mL (ref 0–39)

## 2012-02-11 NOTE — Patient Instructions (Signed)
PLEASE TAKE PERIDIN C , 2 PILLS , 3 TIMES PER DAY. WE WILL DO XRAYS OF YOUR LOWER BACK.Marland Kitchen PLEASE HAVE DR Kelly Splinter REMOVE YOUR PORT AT TIME OF RECONSTRUCTION.

## 2012-02-11 NOTE — Progress Notes (Signed)
Hematology and Oncology Follow Up Visit  Martha Pham [id number] [birth year] 61 y.o. 02/11/2012    HPI: [name] is a 61 year old [place] woman with a history of a T2, N0, triple negative infiltrating ductal carcinoma of the left breast, for which she underwent an initial left lumpectomy with sentinel node dissection on 06/24/2011, followed by re-excision to positive margins, followed by a left simple mastectomy on 09/01/2011 due to positive margins. Following the mastectomy the margins were clear.  Interim History:    Martha Pham is seen today for followup. She is on long-term disability. She is doing fairly well. She does have fairly difficult no sinus congestion early part of July. She's also been having some low back pain especially in left side of her back again her left leg. She's also having hot flashes. She is likely due for followup mammogram of the contralateral side sometime next month or 2.  Review of Systems: Constitutional:  no weight loss, fever, night sweats and fatigued Eyes: dry eyes and uses contacts ENT: abit of taste change. Cardiovascular: no chest pain or dyspnea on exertion Respiratory: no cough, shortness of breath, or wheezing Neurological: no TIA or stroke symptoms Dermatological: Having dry skin Gastrointestinal: no abdominal pain, change in bowel habits, or black or bloody stools Genito-Urinary: no dysuria, trouble voiding, or hematuria Hematological and Lymphatic: negative Breast: negative Musculoskeletal: positive for - pain in back - left, lower which is chronic in nature. Remaining ROS negative.   Medications:   I have reviewed the patient's current medications.  Current Outpatient Prescriptions  Medication Sig Dispense Refill  . atorvastatin (LIPITOR) 10 MG tablet Take 10 mg by mouth daily.        . calcium carbonate (OS-CAL) 600 MG TABS Take 600 mg by mouth 2 (two) times daily with a meal.        . Carboxymethylcellulose Sodium  (REFRESH OP) Apply 1 drop to eye 2 (two) times daily as needed. For dry eyes      . losartan-hydrochlorothiazide (HYZAAR) 50-12.5 MG per tablet 1 tablet daily.       . Multiple Vitamin (MULTIVITAMIN) capsule Take 1 capsule by mouth daily.        . Alum & Mag Hydroxide-Simeth (MAGIC MOUTHWASH W/LIDOCAINE) SOLN Take 10 mLs by mouth 4 (four) times daily as needed.  240 mL  0  . dexamethasone (DECADRON) 4 MG tablet Take 2 tablets two times a day the day before Taxotere.  Then take 2 tabs two times a day starting the day after chemo for 3 days  30 tablet  1  . HYDROcodone-acetaminophen (NORCO) 5-325 MG per tablet as needed.      . lidocaine-prilocaine (EMLA) cream Apply topically as needed.  30 g  0  . ondansetron (ZOFRAN) 8 MG tablet Take 1 tablet two times a day starting the day after chemo for 3 days.  Then take one tab two times a day as needed for nausea or vomiting.  30 tablet  1  . DISCONTD: MICARDIS HCT 40-12.5 MG per tablet Take 1 tablet by mouth Daily.        Allergies:  Allergies  Allergen Reactions  . Dust Mite Extract Itching  . Other     grass    Physical Exam: Filed Vitals:   02/11/12 1325  BP: 148/76  Pulse: 85  Temp: 98.4 F (36.9 C)    Body mass index is 34.15 kg/(m^2). Weight: 170 lbs. HEENT:  Sclerae anicteric, conjunctivae pink.  Oropharynx clear.  No mucositis or candidiasis.   Nodes:  No cervical, supraclavicular, or axillary lymphadenopathy palpated.  Breast Exam: Left mastectomy scar is benign with no evidence of any dominant mass effect. No evidence of skin nodules or changes. Lungs:  Clear to auscultation bilaterally.  No crackles, rhonchi, or wheezes.   Heart:  Regular rate and rhythm.   Abdomen:  Soft, nontender.  Positive bowel sounds.  No organomegaly or masses palpated.   Musculoskeletal:  No focal spinal tenderness to palpation.  Extremities:  Benign.  No peripheral edema or cyanosis.   Skin:  Benign.   Neuro:  Nonfocal, alert and oriented x 3   Lab  Results: Lab Results  Component Value Date   WBC 3.4* 02/11/2012   HGB 12.6 02/11/2012   HCT 38.4 02/11/2012   MCV 94.4 02/11/2012   PLT 170 02/11/2012   NEUTROABS 1.6 02/11/2012     Chemistry      Component Value Date/Time   NA 138 12/03/2011 1324   K 3.7 12/03/2011 1324   CL 103 12/03/2011 1324   CO2 27 12/03/2011 1324   BUN 17 12/03/2011 1324   CREATININE 0.70 12/03/2011 1324      Component Value Date/Time   CALCIUM 9.2 12/03/2011 1324   ALKPHOS 60 12/03/2011 1324   AST 17 12/03/2011 1324   ALT 19 12/03/2011 1324   BILITOT 0.3 12/03/2011 1324      Lab Results  Component Value Date   LABCA2 33 06/11/2011     Assessment:  [name] is a 61 year old [place] woman with a history of a T2, N0, triple negative infiltrating ductal carcinoma of the left breast, for which she underwent an initial left lumpectomy with sentinel node dissection on 06/24/2011, followed by re-excision to positive margins, followed by a left simple mastectomy on 09/01/2011 due to positive margins. Following the mastectomy the margins were clear.    Plan:  Martha Pham is doing well. She will return in 6 months for followup . We will flush her port next weekend the plastic surgeon remove it at the time of reconstruction. I have scheduled a mammogram for the right breast in October. We'll image her lower back today. I've given instructions about the hot flash control with paradigm C. I will see her in 6 months. the 'll be undergoing her breast reconstruction under the care of Dr. Kelly Splinter.  She does be be happy to see her in the interim if the need should arise.  This plan was reviewed with the patient, who voices understanding and agreement.  She knows to call with any changes or problems.    Richardson Chiquito 02/11/2012

## 2012-02-11 NOTE — Telephone Encounter (Signed)
gve the pt her jan 2014 appt calendar along with the mammo appt 

## 2012-02-18 ENCOUNTER — Ambulatory Visit (HOSPITAL_BASED_OUTPATIENT_CLINIC_OR_DEPARTMENT_OTHER): Payer: BC Managed Care – PPO

## 2012-02-18 VITALS — BP 143/79 | HR 77 | Temp 98.0°F

## 2012-02-18 DIAGNOSIS — C50919 Malignant neoplasm of unspecified site of unspecified female breast: Secondary | ICD-10-CM

## 2012-02-18 DIAGNOSIS — C50219 Malignant neoplasm of upper-inner quadrant of unspecified female breast: Secondary | ICD-10-CM

## 2012-02-18 DIAGNOSIS — Z452 Encounter for adjustment and management of vascular access device: Secondary | ICD-10-CM

## 2012-02-18 MED ORDER — HEPARIN SOD (PORK) LOCK FLUSH 100 UNIT/ML IV SOLN
500.0000 [IU] | Freq: Once | INTRAVENOUS | Status: AC
  Administered 2012-02-18: 500 [IU] via INTRAVENOUS
  Filled 2012-02-18: qty 5

## 2012-02-18 MED ORDER — SODIUM CHLORIDE 0.9 % IJ SOLN
10.0000 mL | INTRAMUSCULAR | Status: DC | PRN
  Administered 2012-02-18: 10 mL via INTRAVENOUS
  Filled 2012-02-18: qty 10

## 2012-02-18 NOTE — Patient Instructions (Signed)
Call md with problems 

## 2012-02-25 ENCOUNTER — Other Ambulatory Visit: Payer: Self-pay | Admitting: Gastroenterology

## 2012-03-02 ENCOUNTER — Telehealth: Payer: Self-pay | Admitting: Obstetrics and Gynecology

## 2012-03-02 NOTE — Telephone Encounter (Signed)
TRIAGE/RES °

## 2012-03-10 ENCOUNTER — Encounter: Payer: Self-pay | Admitting: Dietician

## 2012-03-10 ENCOUNTER — Telehealth: Payer: Self-pay

## 2012-03-10 NOTE — Telephone Encounter (Signed)
Pt stated she never received her pap results from 08/20/11. Looked in paper chart pap was normal . Pt will be printed a copy and copy will be mailed to her address. Pt made aware and voiced understanding.  [facility] CMA

## 2012-03-10 NOTE — Progress Notes (Signed)
Brief Out-patient Oncology Nutrition Note  Reason: Patient attended breast cancer nutrition course.   I have educated the patient on plant-based diet. I also discussed and provided nutrition handouts and research based evidence on breast cancer nutrition. We discussed nutrition management for symptoms associated with treatment. The class lasted a duration of 1 hour. The patient's asked good questions and were without any further nutrition related questions or concerns. RD contact information was provided. Patient's were instructed to contact RD for future nutrition questions or concerns.   RD available for nutrition needs.   Martha Pham D Emsley Custer, MS, RD, LDN [phone]  

## 2012-03-26 ENCOUNTER — Encounter: Payer: Self-pay | Admitting: Emergency Medicine

## 2012-03-26 NOTE — Progress Notes (Signed)
Prescription for a wig given to the front desk for patient to pick up.

## 2012-03-31 ENCOUNTER — Telehealth (INDEPENDENT_AMBULATORY_CARE_PROVIDER_SITE_OTHER): Payer: Self-pay

## 2012-03-31 NOTE — Telephone Encounter (Signed)
Left message regarding appointment cancellation for 04/01/12 w/Dr. Biagio Quint.  Pt appt has been r/s to 04/09/12

## 2012-04-01 ENCOUNTER — Ambulatory Visit (INDEPENDENT_AMBULATORY_CARE_PROVIDER_SITE_OTHER): Payer: BC Managed Care – PPO | Admitting: General Surgery

## 2012-04-03 ENCOUNTER — Other Ambulatory Visit: Payer: Self-pay | Admitting: Oncology

## 2012-04-09 ENCOUNTER — Ambulatory Visit (INDEPENDENT_AMBULATORY_CARE_PROVIDER_SITE_OTHER): Payer: BC Managed Care – PPO | Admitting: General Surgery

## 2012-04-09 ENCOUNTER — Encounter (INDEPENDENT_AMBULATORY_CARE_PROVIDER_SITE_OTHER): Payer: Self-pay | Admitting: General Surgery

## 2012-04-09 VITALS — BP 150/98 | HR 64 | Temp 97.8°F | Resp 14 | Ht 60.0 in | Wt 171.6 lb

## 2012-04-09 DIAGNOSIS — Z853 Personal history of malignant neoplasm of breast: Secondary | ICD-10-CM

## 2012-04-09 NOTE — Progress Notes (Signed)
Subjective:     Patient ID: ADRINNE Pham, female   DOB: 07/28/1950, 60 y.o.   MRN: [id number]  HPI She follows up status post left mastectomy and sentinel lymph node biopsy for a T2, N0, M0 left breast cancer with her last procedure performed in February of this year. She completed her chemotherapy in May and since then has been feeling much better and her energy level has been increasing. She is no complaints. She says that she is doing herself breast exam and has not noticed any changes or suspicious masses. She is scheduled for reconstruction in October with Dr. Kelly Splinter.  She is also scheduled for a mammogram in October as well  Review of Systems     Objective:   Physical Exam No acute distress and nontoxic-appearing Her right breast is normal exam without any suspicious skin changes, dimpling, suspicious masses, or lymphadenopathy Her left breast status post mastectomy of her incision is healing well. She has no evidence of recurrent in this area and no suspicious masses. No lymphadenopathy    Assessment:     History of left breast cancer-doing well There is no evidence of any recurrent tumors on exam. She is scheduled for reconstruction of a couple weeks and I recommended that she obtain her mammogram prior to her procedure. If this is normal and recommend that she continue with her monthly self breast exams and I will see her back in February for repeat evaluation    Plan:     Continue monthly self breast exams and with repeat mammograms in October I will see her back in February assuming abnormalities on exam or mammogram

## 2012-04-12 ENCOUNTER — Telehealth: Payer: Self-pay | Admitting: *Deleted

## 2012-04-12 NOTE — Telephone Encounter (Signed)
Patient called and needed help to reschedule her mammogram appt. I have forwarded her to [employer] the scheduler.    JMW

## 2012-04-20 HISTORY — PX: RECONSTRUCTION BREAST IMMEDIATE / DELAYED W/ TISSUE EXPANDER: SUR1077

## 2012-04-29 ENCOUNTER — Encounter (HOSPITAL_BASED_OUTPATIENT_CLINIC_OR_DEPARTMENT_OTHER): Payer: Self-pay | Admitting: *Deleted

## 2012-04-30 ENCOUNTER — Encounter (HOSPITAL_BASED_OUTPATIENT_CLINIC_OR_DEPARTMENT_OTHER): Payer: Self-pay | Admitting: *Deleted

## 2012-04-30 NOTE — Progress Notes (Signed)
Has been here x2 Will stay [facility]-to bring cpap,all meds,overnight bag To come in for bmet

## 2012-05-03 ENCOUNTER — Ambulatory Visit
Admission: RE | Admit: 2012-05-03 | Discharge: 2012-05-03 | Disposition: A | Discharge status: Home / Self Care | Payer: BC Managed Care – PPO | Source: Ambulatory Visit | Attending: Oncology | Admitting: Oncology

## 2012-05-03 ENCOUNTER — Encounter (HOSPITAL_BASED_OUTPATIENT_CLINIC_OR_DEPARTMENT_OTHER)
Admission: RE | Admit: 2012-05-03 | Discharge: 2012-05-03 | Disposition: A | Discharge status: Home / Self Care | Payer: BC Managed Care – PPO | Source: Ambulatory Visit | Attending: Plastic Surgery | Admitting: Plastic Surgery

## 2012-05-03 ENCOUNTER — Other Ambulatory Visit: Payer: Self-pay | Admitting: Plastic Surgery

## 2012-05-03 DIAGNOSIS — Z9010 Acquired absence of unspecified breast and nipple: Secondary | ICD-10-CM

## 2012-05-03 DIAGNOSIS — C50219 Malignant neoplasm of upper-inner quadrant of unspecified female breast: Secondary | ICD-10-CM

## 2012-05-03 LAB — BASIC METABOLIC PANEL
CO2: 28 mEq/L (ref 19–32)
GFR calc non Af Amer: 68 mL/min — ABNORMAL LOW (ref >90)
Glucose, Bld: 99 mg/dL (ref 70–99)
Potassium: 3.8 mEq/L (ref 3.5–5.1)
Sodium: 142 mEq/L (ref 135–145)

## 2012-05-03 NOTE — H&P (Signed)
This document contains confidential information from a [facility] medical record system and may be unauthenticated. Release may be made only with a valid authorization or in accordance with applicable policies of Medical Center or its affiliates. This document must be maintained in a secure manner or discarded/destroyed as required by Medical Center policy or by a confidential means such as shredding.   Masa Lubin   04/13/2012 8:45 AM Office Visit  MRN: [id number]  Department: [facility]  Dept Phone: [phone]  Description: Female DOB: [birth year]  Provider: Wayland Denis, DO    Diagnoses     Acquired absence of breast and absent nipple   - Primary   V45.71      Reason for Visit  -  Breast Reconstruction     Vitals - Last Recorded     BP Pulse Temp Ht Wt BMI    141/75  66  98.1 F (36.7 C) (Oral)  1.524 m (5')  76.204 kg (168 lb)  32.81 kg/m2     Subjective:    Patient ID: Martha Pham is a 61 y.o. female.  HPI The patient is a 61 yrs old bf here for a history and physical for breast reconstruction. She underwent a left mastectomy and sentinel lymph node biopsy for triple negative left breast cancer February 2012. She completed her adjuvant chemotherapy in May 2013. She is doing very well and denies any fevers or chills. She has a fair amount of excess tissue in the area of the left breast. The right breast has grade III ptosis. She is schedule for left expander/FlexHD placement.  The following portions of the patient's history were reviewed and updated as appropriate: allergies, current medications, past family history, past medical history, past social history, past surgical history and problem list.  Review of Systems  Constitutional: Negative.   HENT: Negative.   Eyes: Negative.   Respiratory: Negative.   Cardiovascular: Negative.   Gastrointestinal: Negative.   Genitourinary: Negative.   Musculoskeletal: Negative.   Neurological: Negative.     Hematological: Negative.   Psychiatric/Behavioral: Negative.       Objective:    Physical Exam  Constitutional: She appears well-developed and well-nourished.  HENT:   Head: Normocephalic and atraumatic.  Eyes: EOM are normal. Pupils are equal, round, and reactive to light.  Cardiovascular: Normal rate.   Pulmonary/Chest: Effort normal.  Abdominal: Soft.  Musculoskeletal: Normal range of motion.  Neurological: She is alert.  Skin: Skin is warm.  Psychiatric: She has a normal mood and affect. Her behavior is normal. Judgment and thought content normal.      Assessment:     1.  Acquired absence of breast and absent nipple        Plan:     Left breast expander placement with Flex HD.  Risks and complications were reviewed.   Medications Ordered This Encounter      cephalexin (KEFLEX) 500 MG capsule 28 Take 1 capsule (500 mg total) by mouth 4 times daily.    HYDROcodone-acetaminophen (NORCO) 5-325 mg per tablet 30 Take 1 tablet by mouth every 6 (six) hours as needed for 10 days for Pain.    docusate sodium (COLACE) 100 MG capsule Take 1 capsule (100 mg total) by mouth 3 times daily.    diazepam (VALIUM) 2 MG tablet 30 Take 1 tablet (2 mg total) by mouth every 6 (six) hours as needed for 10 days for Anxiety.    promethazine (PHENERGAN) 12.5 MG tablet  Take  2 tablets (25 mg total) by mouth every 6 (six) hours as needed for 7 days for Nausea.     Level of Service     PRE-SURGICAL H&P IN PHYSICIAN OFFICE/CLINIC [992HP]      Follow-up and Disposition     Return in about 4 weeks (around 05/11/2012).

## 2012-05-05 ENCOUNTER — Ambulatory Visit (HOSPITAL_BASED_OUTPATIENT_CLINIC_OR_DEPARTMENT_OTHER): Payer: BC Managed Care – PPO | Admitting: *Deleted

## 2012-05-05 ENCOUNTER — Encounter (HOSPITAL_BASED_OUTPATIENT_CLINIC_OR_DEPARTMENT_OTHER)
Admission: RE | Disposition: A | Discharge status: Home / Self Care | Payer: Self-pay | Source: Ambulatory Visit | Attending: Plastic Surgery

## 2012-05-05 ENCOUNTER — Ambulatory Visit (HOSPITAL_BASED_OUTPATIENT_CLINIC_OR_DEPARTMENT_OTHER)
Admission: RE | Admit: 2012-05-05 | Discharge: 2012-05-06 | Disposition: A | Discharge status: Home / Self Care | Payer: BC Managed Care – PPO | Source: Ambulatory Visit | Attending: Plastic Surgery | Admitting: Plastic Surgery

## 2012-05-05 ENCOUNTER — Encounter (HOSPITAL_BASED_OUTPATIENT_CLINIC_OR_DEPARTMENT_OTHER): Payer: Self-pay | Admitting: *Deleted

## 2012-05-05 DIAGNOSIS — Z853 Personal history of malignant neoplasm of breast: Secondary | ICD-10-CM | POA: Insufficient documentation

## 2012-05-05 DIAGNOSIS — I10 Essential (primary) hypertension: Secondary | ICD-10-CM | POA: Insufficient documentation

## 2012-05-05 DIAGNOSIS — Z9010 Acquired absence of unspecified breast and nipple: Secondary | ICD-10-CM | POA: Insufficient documentation

## 2012-05-05 DIAGNOSIS — Z421 Encounter for breast reconstruction following mastectomy: Secondary | ICD-10-CM | POA: Insufficient documentation

## 2012-05-05 HISTORY — DX: Presence of dental prosthetic device (complete) (partial): Z97.2

## 2012-05-05 HISTORY — DX: Encounter for fitting and adjustment of spectacles and contact lenses: Z46.0

## 2012-05-05 HISTORY — DX: Sleep apnea, unspecified: G47.30

## 2012-05-05 SURGERY — BREAST RECONSTRUCTION WITH PLACEMENT OF TISSUE EXPANDER AND FLEX HD (ACELLULAR HYDRATED DERMIS)
Anesthesia: General | Site: Breast | Laterality: Left | Wound class: Clean

## 2012-05-05 MED ORDER — SODIUM CHLORIDE 0.9 % IV SOLN
3.0000 g | Freq: Four times a day (QID) | INTRAVENOUS | Status: DC
  Administered 2012-05-05 – 2012-05-06 (×2): 3 g via INTRAVENOUS

## 2012-05-05 MED ORDER — OXYCODONE HCL 5 MG/5ML PO SOLN: 5.0000 mg | Freq: Once | ORAL | Status: DC | PRN

## 2012-05-05 MED ORDER — ACETAMINOPHEN 10 MG/ML IV SOLN
1000.0000 mg | Freq: Once | INTRAVENOUS | Status: AC
  Administered 2012-05-05: 1000 mg via INTRAVENOUS

## 2012-05-05 MED ORDER — HYDROCODONE-ACETAMINOPHEN 5-325 MG PO TABS
1.0000 | ORAL_TABLET | ORAL | Status: DC | PRN
  Administered 2012-05-05 – 2012-05-06 (×3): 2 via ORAL

## 2012-05-05 MED ORDER — SODIUM CHLORIDE 0.9 % IR SOLN
Status: DC | PRN
  Administered 2012-05-05: 10:00:00

## 2012-05-05 MED ORDER — FENTANYL CITRATE 0.05 MG/ML IJ SOLN: 50.0000 ug | Freq: Once | INTRAMUSCULAR | Status: DC

## 2012-05-05 MED ORDER — MIDAZOLAM HCL 2 MG/2ML IJ SOLN: 1.0000 mg | INTRAMUSCULAR | Status: DC | PRN

## 2012-05-05 MED ORDER — ONDANSETRON HCL 4 MG/2ML IJ SOLN
INTRAMUSCULAR | Status: DC | PRN
  Administered 2012-05-05: 4 mg via INTRAVENOUS

## 2012-05-05 MED ORDER — MORPHINE SULFATE 2 MG/ML IJ SOLN: 2.0000 mg | INTRAMUSCULAR | Status: DC | PRN

## 2012-05-05 MED ORDER — SODIUM CHLORIDE 0.9 % IR SOLN
Status: DC | PRN
  Administered 2012-05-05: 200 mL

## 2012-05-05 MED ORDER — DOCUSATE SODIUM 100 MG PO CAPS
100.0000 mg | ORAL_CAPSULE | Freq: Three times a day (TID) | ORAL | Status: DC
  Administered 2012-05-05: 100 mg via ORAL

## 2012-05-05 MED ORDER — SODIUM CHLORIDE 0.9 % IV SOLN
3.0000 g | Freq: Four times a day (QID) | INTRAVENOUS | Status: DC
  Administered 2012-05-05: 3 g via INTRAVENOUS
  Filled 2012-05-05 (×4): qty 3

## 2012-05-05 MED ORDER — CEFAZOLIN SODIUM-DEXTROSE 2-3 GM-% IV SOLR
2.0000 g | INTRAVENOUS | Status: AC
  Administered 2012-05-05: 2 g via INTRAVENOUS

## 2012-05-05 MED ORDER — ONDANSETRON HCL 4 MG/2ML IJ SOLN: 4.0000 mg | Freq: Four times a day (QID) | INTRAMUSCULAR | Status: DC | PRN

## 2012-05-05 MED ORDER — SUCCINYLCHOLINE CHLORIDE 20 MG/ML IJ SOLN
INTRAMUSCULAR | Status: DC | PRN
  Administered 2012-05-05: 120 mg via INTRAVENOUS

## 2012-05-05 MED ORDER — HYDROMORPHONE HCL PF 1 MG/ML IJ SOLN
0.2500 mg | INTRAMUSCULAR | Status: DC | PRN
  Administered 2012-05-05 (×4): 0.5 mg via INTRAVENOUS

## 2012-05-05 MED ORDER — HYDROMORPHONE HCL 2 MG PO TABS: 2.0000 mg | ORAL_TABLET | Freq: Once | ORAL | Status: DC

## 2012-05-05 MED ORDER — OXYCODONE HCL 5 MG PO TABS: 5.0000 mg | ORAL_TABLET | Freq: Once | ORAL | Status: DC | PRN

## 2012-05-05 MED ORDER — 0.9 % SODIUM CHLORIDE (POUR BTL) OPTIME
TOPICAL | Status: DC | PRN
  Administered 2012-05-05: 1000 mL

## 2012-05-05 MED ORDER — PANTOPRAZOLE SODIUM 40 MG IV SOLR: 40.0000 mg | Freq: Every day | INTRAVENOUS | Status: DC

## 2012-05-05 MED ORDER — PROPOFOL 10 MG/ML IV BOLUS
INTRAVENOUS | Status: DC | PRN
  Administered 2012-05-05: 200 mg via INTRAVENOUS

## 2012-05-05 MED ORDER — ACETAMINOPHEN 325 MG PO TABS: 650.0000 mg | ORAL_TABLET | Freq: Four times a day (QID) | ORAL | Status: DC | PRN

## 2012-05-05 MED ORDER — DIAZEPAM 2 MG PO TABS
2.0000 mg | ORAL_TABLET | Freq: Four times a day (QID) | ORAL | Status: DC | PRN
  Administered 2012-05-05 – 2012-05-06 (×3): 2 mg via ORAL

## 2012-05-05 MED ORDER — ACETAMINOPHEN 650 MG RE SUPP: 650.0000 mg | Freq: Four times a day (QID) | RECTAL | Status: DC | PRN

## 2012-05-05 MED ORDER — EPHEDRINE SULFATE 50 MG/ML IJ SOLN
INTRAMUSCULAR | Status: DC | PRN
  Administered 2012-05-05 (×2): 10 mg via INTRAVENOUS

## 2012-05-05 MED ORDER — PROMETHAZINE HCL 25 MG/ML IJ SOLN
6.2500 mg | INTRAMUSCULAR | Status: DC | PRN
  Administered 2012-05-05: 6.25 mg via INTRAVENOUS

## 2012-05-05 MED ORDER — LIDOCAINE HCL (CARDIAC) 20 MG/ML IV SOLN
INTRAVENOUS | Status: DC | PRN
  Administered 2012-05-05: 50 mg via INTRAVENOUS

## 2012-05-05 MED ORDER — MIDAZOLAM HCL 5 MG/5ML IJ SOLN
INTRAMUSCULAR | Status: DC | PRN
  Administered 2012-05-05: 1 mg via INTRAVENOUS

## 2012-05-05 MED ORDER — DEXAMETHASONE SODIUM PHOSPHATE 4 MG/ML IJ SOLN
INTRAMUSCULAR | Status: DC | PRN
  Administered 2012-05-05: 10 mg via INTRAVENOUS

## 2012-05-05 MED ORDER — KCL IN DEXTROSE-NACL 20-5-0.45 MEQ/L-%-% IV SOLN
INTRAVENOUS | Status: DC
  Administered 2012-05-05: 100 mL/h via INTRAVENOUS

## 2012-05-05 MED ORDER — LACTATED RINGERS IV SOLN
INTRAVENOUS | Status: DC
  Administered 2012-05-05 (×3): via INTRAVENOUS

## 2012-05-05 MED ORDER — FENTANYL CITRATE 0.05 MG/ML IJ SOLN
INTRAMUSCULAR | Status: DC | PRN
  Administered 2012-05-05: 100 ug via INTRAVENOUS
  Administered 2012-05-05 (×2): 25 ug via INTRAVENOUS

## 2012-05-05 SURGICAL SUPPLY — 64 items
ADH SKN CLS APL DERMABOND .7 (GAUZE/BANDAGES/DRESSINGS) ×1
BAG DECANTER FOR FLEXI CONT (MISCELLANEOUS) ×2 IMPLANT
BANDAGE GAUZE ELAST BULKY 4 IN (GAUZE/BANDAGES/DRESSINGS) ×4 IMPLANT
BINDER BREAST LRG (GAUZE/BANDAGES/DRESSINGS) IMPLANT
BINDER BREAST MEDIUM (GAUZE/BANDAGES/DRESSINGS) IMPLANT
BINDER BREAST XLRG (GAUZE/BANDAGES/DRESSINGS) ×1 IMPLANT
BINDER BREAST XXLRG (GAUZE/BANDAGES/DRESSINGS) IMPLANT
BIOPATCH BLUE 3/4IN DISK W/1.5 (GAUZE/BANDAGES/DRESSINGS) ×1 IMPLANT
BLADE HEX COATED 2.75 (ELECTRODE) ×2 IMPLANT
BLADE SURG 15 STRL LF DISP TIS (BLADE) ×1 IMPLANT
BLADE SURG 15 STRL SS (BLADE) ×2
CANISTER SUCTION 1200CC (MISCELLANEOUS) ×2 IMPLANT
CHLORAPREP W/TINT 26ML (MISCELLANEOUS) ×2 IMPLANT
CLOTH BEACON ORANGE TIMEOUT ST (SAFETY) ×2 IMPLANT
CORDS BIPOLAR (ELECTRODE) IMPLANT
COVER MAYO STAND STRL (DRAPES) ×2 IMPLANT
COVER TABLE BACK 60X90 (DRAPES) ×2 IMPLANT
DECANTER SPIKE VIAL GLASS SM (MISCELLANEOUS) IMPLANT
DERMABOND ADVANCED (GAUZE/BANDAGES/DRESSINGS) ×1
DERMABOND ADVANCED .7 DNX12 (GAUZE/BANDAGES/DRESSINGS) ×1 IMPLANT
DRAIN CHANNEL 19F RND (DRAIN) ×1 IMPLANT
DRAPE LAPAROSCOPIC ABDOMINAL (DRAPES) ×2 IMPLANT
DRSG PAD ABDOMINAL 8X10 ST (GAUZE/BANDAGES/DRESSINGS) ×3 IMPLANT
DRSG TEGADERM 2-3/8X2-3/4 SM (GAUZE/BANDAGES/DRESSINGS) ×1 IMPLANT
ELECT BLADE 4.0 EZ CLEAN MEGAD (MISCELLANEOUS) ×2
ELECT REM PT RETURN 9FT ADLT (ELECTROSURGICAL) ×2
ELECTRODE BLDE 4.0 EZ CLN MEGD (MISCELLANEOUS) ×1 IMPLANT
ELECTRODE REM PT RTRN 9FT ADLT (ELECTROSURGICAL) ×1 IMPLANT
EVACUATOR SILICONE 100CC (DRAIN) ×1 IMPLANT
GAUZE SPONGE 4X4 12PLY STRL LF (GAUZE/BANDAGES/DRESSINGS) IMPLANT
GLOVE BIO SURGEON STRL SZ 6.5 (GLOVE) ×4 IMPLANT
GLOVE SKINSENSE NS SZ6.5 (GLOVE) ×2
GLOVE SKINSENSE NS SZ7.0 (GLOVE) ×1
GLOVE SKINSENSE STRL SZ6.5 (GLOVE) IMPLANT
GLOVE SKINSENSE STRL SZ7.0 (GLOVE) IMPLANT
GOWN PREVENTION PLUS XLARGE (GOWN DISPOSABLE) ×4 IMPLANT
GRAFT FLEX HD 4X16 THICK (Tissue Mesh) ×1 IMPLANT
IV NS 1000ML (IV SOLUTION)
IV NS 1000ML BAXH (IV SOLUTION) IMPLANT
IV NS 500ML (IV SOLUTION) ×2
IV NS 500ML BAXH (IV SOLUTION) ×1 IMPLANT
KIT FILL MCGHAN 30CC (MISCELLANEOUS) ×1 IMPLANT
NDL HYPO 25X1 1.5 SAFETY (NEEDLE) IMPLANT
NEEDLE HYPO 25X1 1.5 SAFETY (NEEDLE) IMPLANT
PACK BASIN DAY SURGERY FS (CUSTOM PROCEDURE TRAY) ×2 IMPLANT
PENCIL BUTTON HOLSTER BLD 10FT (ELECTRODE) ×2 IMPLANT
PIN SAFETY STERILE (MISCELLANEOUS) ×1 IMPLANT
SET ASEPTIC TRANSFER (MISCELLANEOUS) ×1 IMPLANT
SLEEVE SCD COMPRESS KNEE MED (MISCELLANEOUS) ×2 IMPLANT
SPONGE LAP 18X18 X RAY DECT (DISPOSABLE) ×3 IMPLANT
SUT MON AB 5-0 PS2 18 (SUTURE) ×2 IMPLANT
SUT PDS AB 2-0 CT2 27 (SUTURE) ×2 IMPLANT
SUT SILK 3 0 PS 1 (SUTURE) ×1 IMPLANT
SUT VIC AB 3-0 SH 27 (SUTURE) ×2
SUT VIC AB 3-0 SH 27X BRD (SUTURE) ×1 IMPLANT
SUT VICRYL 4-0 PS2 18IN ABS (SUTURE) ×2 IMPLANT
SYR 50ML LL SCALE MARK (SYRINGE) ×1 IMPLANT
SYR BULB IRRIGATION 50ML (SYRINGE) ×2 IMPLANT
SYR CONTROL 10ML LL (SYRINGE) IMPLANT
TISSUE EXPANDER 350CC (Miscellaneous) ×1 IMPLANT
TOWEL OR 17X24 6PK STRL BLUE (TOWEL DISPOSABLE) ×4 IMPLANT
TUBE CONNECTING 20X1/4 (TUBING) ×2 IMPLANT
UNDERPAD 30X30 INCONTINENT (UNDERPADS AND DIAPERS) ×4 IMPLANT
YANKAUER SUCT BULB TIP NO VENT (SUCTIONS) ×2 IMPLANT

## 2012-05-05 NOTE — Anesthesia Postprocedure Evaluation (Signed)
  Anesthesia Post-op Note  Patient: Martha Pham  Procedure(s) Performed: Procedure(s) (LRB) with comments: BREAST RECONSTRUCTION WITH PLACEMENT OF TISSUE EXPANDER AND FLEX HD (ACELLULAR HYDRATED DERMIS) (Left) - left breast recontruction with expander and flex hd placement   Patient Location: PACU  Anesthesia Type: General  Level of Consciousness: awake, alert  and oriented  Airway and Oxygen Therapy: Patient Spontanous Breathing  Post-op Pain: moderate  Post-op Assessment: Post-op Vital signs reviewed  Post-op Vital Signs: Reviewed  Complications: No apparent anesthesia complications

## 2012-05-05 NOTE — H&P (View-Only) (Signed)
 This document contains confidential information from a [facility] medical record system and may be unauthenticated. Release may be made only with a valid authorization or in accordance with applicable policies of Medical Center or its affiliates. This document must be maintained in a secure manner or discarded/destroyed as required by Medical Center policy or by a confidential means such as shredding.   Martha Pham   04/13/2012 8:45 AM Office Visit  MRN: [id number]  Department: [facility]  Dept Phone: [phone]  Description: Female DOB: 06/09/1951  Provider: Claire Sanger, DO    Diagnoses     Acquired absence of breast and absent nipple   - Primary   V45.71      Reason for Visit  -  Breast Reconstruction     Vitals - Last Recorded     BP Pulse Temp Ht Wt BMI    141/75  66  98.1 F (36.7 C) (Oral)  1.524 m (5')  76.204 kg (168 lb)  32.81 kg/m2     Subjective:    Patient ID: Martha Pham is a 61 y.o. female.  HPI The patient is a 61 yrs old bf here for a history and physical for breast reconstruction. She underwent a left mastectomy and sentinel lymph node biopsy for triple negative left breast cancer February 2012. She completed her adjuvant chemotherapy in May 2013. She is doing very well and denies any fevers or chills. She has a fair amount of excess tissue in the area of the left breast. The right breast has grade III ptosis. She is schedule for left expander/FlexHD placement.  The following portions of the patient's history were reviewed and updated as appropriate: allergies, current medications, past family history, past medical history, past social history, past surgical history and problem list.  Review of Systems  Constitutional: Negative.   HENT: Negative.   Eyes: Negative.   Respiratory: Negative.   Cardiovascular: Negative.   Gastrointestinal: Negative.   Genitourinary: Negative.   Musculoskeletal: Negative.   Neurological: Negative.     Hematological: Negative.   Psychiatric/Behavioral: Negative.       Objective:    Physical Exam  Constitutional: She appears well-developed and well-nourished.  HENT:   Head: Normocephalic and atraumatic.  Eyes: EOM are normal. Pupils are equal, round, and reactive to light.  Cardiovascular: Normal rate.   Pulmonary/Chest: Effort normal.  Abdominal: Soft.  Musculoskeletal: Normal range of motion.  Neurological: She is alert.  Skin: Skin is warm.  Psychiatric: She has a normal mood and affect. Her behavior is normal. Judgment and thought content normal.      Assessment:     1.  Acquired absence of breast and absent nipple        Plan:     Left breast expander placement with Flex HD.  Risks and complications were reviewed.   Medications Ordered This Encounter      cephalexin (KEFLEX) 500 MG capsule 28 Take 1 capsule (500 mg total) by mouth 4 times daily.    HYDROcodone-acetaminophen (NORCO) 5-325 mg per tablet 30 Take 1 tablet by mouth every 6 (six) hours as needed for 10 days for Pain.    docusate sodium (COLACE) 100 MG capsule Take 1 capsule (100 mg total) by mouth 3 times daily.    diazepam (VALIUM) 2 MG tablet 30 Take 1 tablet (2 mg total) by mouth every 6 (six) hours as needed for 10 days for Anxiety.    promethazine (PHENERGAN) 12.5 MG tablet  Take   2 tablets (25 mg total) by mouth every 6 (six) hours as needed for 7 days for Nausea.     Level of Service     PRE-SURGICAL H&P IN PHYSICIAN OFFICE/CLINIC [992HP]      Follow-up and Disposition     Return in about 4 weeks (around 05/11/2012).       

## 2012-05-05 NOTE — Anesthesia Preprocedure Evaluation (Addendum)
Anesthesia Evaluation  Patient identified by MRN, date of birth, ID band Patient awake    Airway Mallampati: I TM Distance: >3 FB Neck ROM: Full    Dental   Pulmonary sleep apnea ,  breath sounds clear to auscultation  Pulmonary exam normal       Cardiovascular hypertension, Rhythm:Regular Rate:Normal     Neuro/Psych    GI/Hepatic   Endo/Other    Renal/GU      Musculoskeletal   Abdominal (+) + obese,   Peds  Hematology   Anesthesia Other Findings   Reproductive/Obstetrics                           Anesthesia Physical Anesthesia Plan  ASA: II  Anesthesia Plan: General   Post-op Pain Management:    Induction: Intravenous  Airway Management Planned: Oral ETT  Additional Equipment:   Intra-op Plan:   Post-operative Plan: Extubation in OR  Informed Consent: I have reviewed the patients History and Physical, chart, labs and discussed the procedure including the risks, benefits and alternatives for the proposed anesthesia with the patient or authorized representative who has indicated his/her understanding and acceptance.     Plan Discussed with: CRNA and Surgeon  Anesthesia Plan Comments:         Anesthesia Quick Evaluation

## 2012-05-05 NOTE — Interval H&P Note (Signed)
History and Physical Interval Note:  05/05/2012 8:28 AM  Martha Pham  has presented today for surgery, with the diagnosis of breast cancer  The various methods of treatment have been discussed with the patient and family. After consideration of risks, benefits and other options for treatment, the patient has consented to  Procedure(s) (LRB) with comments: BREAST RECONSTRUCTION WITH PLACEMENT OF TISSUE EXPANDER AND FLEX HD (ACELLULAR HYDRATED DERMIS) (Left) - left breast recontruction with expander and flex hd placement  as a surgical intervention .  The patient's history has been reviewed, patient examined, no change in status, stable for surgery.  I have reviewed the patient's chart and labs.  Questions were answered to the patient's satisfaction.     SANGER,CLAIRE

## 2012-05-05 NOTE — Transfer of Care (Signed)
Immediate Anesthesia Transfer of Care Note  Patient: Martha Pham  Procedure(s) Performed: Procedure(s) (LRB) with comments: BREAST RECONSTRUCTION WITH PLACEMENT OF TISSUE EXPANDER AND FLEX HD (ACELLULAR HYDRATED DERMIS) (Left) - left breast recontruction with expander and flex hd placement   Patient Location: PACU  Anesthesia Type: General  Level of Consciousness: awake and patient cooperative  Airway & Oxygen Therapy: Patient Spontanous Breathing and Patient connected to face mask oxygen  Post-op Assessment: Report given to PACU RN, Post -op Vital signs reviewed and stable and Patient moving all extremities  Post vital signs: Reviewed and stable  Complications: No apparent anesthesia complications

## 2012-05-05 NOTE — Brief Op Note (Signed)
05/05/2012  10:09 AM  PATIENT:  Kittie Plater  61 y.o. female  PRE-OPERATIVE DIAGNOSIS:  breast cancer-Left   POST-OPERATIVE DIAGNOSIS:  Breast Cancer-Left; Acquired Absence of Left Breast  PROCEDURE:  Procedure(s) (LRB) with comments: BREAST RECONSTRUCTION WITH PLACEMENT OF TISSUE EXPANDER AND FLEX HD (ACELLULAR HYDRATED DERMIS) (Left) - left breast recontruction with expander and flex hd placement   SURGEON:  Surgeon(s) and Role:    * Luismiguel Lamere Sanger, DO - Primary  PHYSICIAN ASSISTANT:   ASSISTANTS: none   ANESTHESIA:   general  EBL:  Total I/O In: 1000 [I.V.:1000] Out: -   BLOOD ADMINISTERED:none  DRAINS: (1) Jackson-Pratt drain(s) with closed bulb suction in the left breast pocket   LOCAL MEDICATIONS USED:  NONE  SPECIMEN:  Source of Specimen:  left mastectomy scar, left lateral breast tissue  DISPOSITION OF SPECIMEN:  PATHOLOGY  COUNTS:  YES  TOURNIQUET:  * No tourniquets in log *  DICTATION: dictated  PLAN OF CARE: Discharge to home after PACU  PATIENT DISPOSITION:  PACU - hemodynamically stable.   Delay start of Pharmacological VTE agent (>24hrs) due to surgical blood loss or risk of bleeding: no

## 2012-05-05 NOTE — Anesthesia Procedure Notes (Signed)
Procedure Name: Intubation Date/Time: 05/05/2012 8:42 AM Performed by: Meyer Russel Pre-anesthesia Checklist: Patient identified, Emergency Drugs available, Suction available and Patient being monitored Patient Re-evaluated:Patient Re-evaluated prior to inductionOxygen Delivery Method: Circle System Utilized Preoxygenation: Pre-oxygenation with 100% oxygen Intubation Type: IV induction Ventilation: Mask ventilation without difficulty Laryngoscope Size: 2 and Miller Grade View: Grade I Tube type: Oral Tube size: 7.0 mm Number of attempts: 1 Airway Equipment and Method: stylet and LTA kit utilized Placement Confirmation: ETT inserted through vocal cords under direct vision,  positive ETCO2 and breath sounds checked- equal and bilateral Secured at: 22 cm Tube secured with: Tape Dental Injury: Teeth and Oropharynx as per pre-operative assessment

## 2012-05-06 LAB — POCT HEMOGLOBIN-HEMACUE: Hemoglobin: 13.7 g/dL (ref 12.0–15.0)

## 2012-05-06 NOTE — Op Note (Signed)
NAMEOKLA, ALBOR NO.:  [id number]  MEDICAL RECORD NO.:  [id number]  LOCATION: [facility]       [facility]  PHYSICIAN:  Wayland Denis, DO      DATE OF BIRTH:  11-14-1950  DATE OF PROCEDURE:  05/05/2012 DATE OF DISCHARGE:                              OPERATIVE REPORT   PREOPERATIVE DIAGNOSIS:  Acquired absence of left breast.  POSTOPERATIVE DIAGNOSIS:  Acquired absence of left breast.  PROCEDURE:  Left breast reconstruction with expander and Flex HD placement.   Expander- 350 mL Mentor Medium Height, reference number [id number], SN number [id number] Flex HD 4 x 16, structural  ATTENDING:  Wayland Denis, DO  ASSISTANT:  None.  ANESTHESIA:  General.  INDICATION FOR PROCEDURE:  The patient is a 61 year old female who underwent left breast mastectomy and originally was unsure if she was going to get a radiation or not.  In the and, she did not get radiation and decided on reconstruction.  Risks and complications were reviewed and included bleeding, pain, scar, risk of anesthesia, capsular contracture, infection, and loss of the reconstruction.  She wished to proceed.  DESCRIPTION OF PROCEDURE:  The patient was taken to the operating room, placed on the operating room table in the supine position.  General anesthesia was administered.  Once adequate, a time-out was called, and all information was confirmed to be correct.  A 15-blade was used to make an elliptical incision around the previous mastectomy scar and that was excised and sent to Pathology due to her history.  The Bovie was then used to dissect down to the pectoralis major muscle.  The flaps on the inferior and superior portion were raised and released from the pectoralis major muscle to help improve expansion.  Hemostasis was achieved with electrocautery.  The pocket was irrigated with antibiotic solution.  The pectoralis major muscle was then lifted off to the  chest wall and irrigation was done with antibiotic solution and hemostasis was achieved with electrocautery.  The Flex HD was then prepared according to the manufacture guidelines.  A 4 x 16 piece was selected.  Several pie-crusting was done in order to help with drainage postoperatively. The shiny side was placed deep and the porous side superficial.  The Flex HD was sutured into place on the edge of the pectoralis major muscle and to the inframammary fold using 2-0 PDS figure-of-eight and running sutures.  The 350 mL Mentor Medium Height expander was selected, prepared according to the manufacture guidelines.  The air was evacuated and the expander was placed underneath the pectoralis major muscle and Flex HD.  The Flex HD was then tacked to the chest wall with 2-0 PDS on the lateral aspect.  A 15-blade was used to make a stab incision and a drain was placed.  Drain was tacked to the skin with 3-0 silk.  The expander was then inflated with 200 mL of injectable saline.  Deep layers were closed with 3-0 Vicryl followed by a 4-0 Vicryl, and a running subcuticular 5-0 Monocryl was used to reapproximate the skin edges.  Dermabond was applied.  An ABD and a breast binder was placed.  The patient tolerated the procedure well.  There were no complications.  She was awakened and taken to  recovery room in stable condition.     Wayland Denis, DO     CS/MEDQ  D:  05/05/2012  T:  05/05/2012  Job:  [id number]

## 2012-05-13 ENCOUNTER — Ambulatory Visit: Payer: BC Managed Care – PPO

## 2012-05-18 ENCOUNTER — Encounter: Payer: Self-pay | Admitting: Oncology

## 2012-05-18 NOTE — Progress Notes (Signed)
Faxed disability paper to [employer] @ [phone].

## 2012-06-23 ENCOUNTER — Telehealth (INDEPENDENT_AMBULATORY_CARE_PROVIDER_SITE_OTHER): Payer: Self-pay

## 2012-06-23 NOTE — Telephone Encounter (Signed)
Return call to patient-  Patient calling to verify her return to work date.  Patient states her disability forms have her returning to work in 2015.  Reviewed FMLA/STDF forms, I do not see any forms with returning to work in 2015.  Patient advised to contact Dr. Donnie Coffin and Dr. Leonie Green office to see if the forms were completed from their office.  Patient will call out office back if she has any further questions or concerns.

## 2012-08-10 ENCOUNTER — Other Ambulatory Visit: Payer: BC Managed Care – PPO | Admitting: Lab

## 2012-08-13 ENCOUNTER — Telehealth: Payer: Self-pay | Admitting: *Deleted

## 2012-08-13 NOTE — Telephone Encounter (Signed)
Pt called requesting an appt for Tuesday since Dr. Donnie Coffin is no longer here.  Confirmed 08/17/12 appt w/ pt.  Mailed letter & calendar to pt.  Pt requested to see Dr. Welton Flakes.

## 2012-08-17 ENCOUNTER — Ambulatory Visit (HOSPITAL_BASED_OUTPATIENT_CLINIC_OR_DEPARTMENT_OTHER): Payer: BC Managed Care – PPO | Admitting: Nurse Practitioner

## 2012-08-17 ENCOUNTER — Telehealth: Payer: Self-pay | Admitting: Oncology

## 2012-08-17 ENCOUNTER — Other Ambulatory Visit: Payer: BC Managed Care – PPO | Admitting: Lab

## 2012-08-17 ENCOUNTER — Ambulatory Visit (HOSPITAL_BASED_OUTPATIENT_CLINIC_OR_DEPARTMENT_OTHER): Payer: BC Managed Care – PPO | Admitting: Lab

## 2012-08-17 ENCOUNTER — Ambulatory Visit: Payer: BC Managed Care – PPO | Admitting: Oncology

## 2012-08-17 VITALS — BP 143/80 | HR 68 | Temp 97.7°F | Resp 20 | Ht 60.0 in | Wt 179.0 lb

## 2012-08-17 DIAGNOSIS — C50219 Malignant neoplasm of upper-inner quadrant of unspecified female breast: Secondary | ICD-10-CM

## 2012-08-17 DIAGNOSIS — Z9010 Acquired absence of unspecified breast and nipple: Secondary | ICD-10-CM

## 2012-08-17 DIAGNOSIS — Z171 Estrogen receptor negative status [ER-]: Secondary | ICD-10-CM

## 2012-08-17 LAB — CBC WITH DIFFERENTIAL/PLATELET
BASO%: 0.8 % (ref 0.0–2.0)
MCHC: 33.4 g/dL (ref 31.5–36.0)
MONO#: 0.4 10*3/uL (ref 0.1–0.9)
NEUT#: 2.7 10*3/uL (ref 1.5–6.5)
RBC: 4.3 10*6/uL (ref 3.70–5.45)
RDW: 12.6 % (ref 11.2–14.5)
WBC: 4.6 10*3/uL (ref 3.9–10.3)
lymph#: 1.3 10*3/uL (ref 0.9–3.3)

## 2012-08-17 LAB — COMPREHENSIVE METABOLIC PANEL (CC13)
ALT: 21 U/L (ref 0–55)
AST: 20 U/L (ref 5–34)
Calcium: 9.4 mg/dL (ref 8.4–10.4)
Chloride: 106 mEq/L (ref 98–107)
Creatinine: 0.8 mg/dL (ref 0.6–1.1)
Potassium: 3.6 mEq/L (ref 3.5–5.1)
Sodium: 141 mEq/L (ref 136–145)
Total Protein: 8.3 g/dL (ref 6.4–8.3)

## 2012-08-17 LAB — CANCER ANTIGEN 27.29: CA 27.29: 33 U/mL (ref 0–39)

## 2012-08-17 NOTE — Telephone Encounter (Signed)
Pt sent back to lab and given schedule for July.

## 2012-08-17 NOTE — Progress Notes (Signed)
Hematology and Oncology Follow Up Visit  Martha Pham [id number] Sep 09, 1950 62 y.o. 08/17/2012    HPI: Martha Pham is a 62 year old [place] woman with a history of a T2, N0, triple negative infiltrating ductal carcinoma of the left breast, for which she underwent an initial left lumpectomy with sentinel node dissection (nodes negative)  on 06/24/2011, followed by re-excision to positive margins, followed by a left simple mastectomy on 09/01/2011 due to positive margins. Following the mastectomy the margins were clear.      Interim History:   Martha Pham is seen today for followup. She is on long-term disability. She is doing fairly well. She had mammogram in 05/13/2012 which was unremarkable. She has no specific complaints today and is doing ok overall. She was able to briefly meet Dr. Welton Flakes and will follow up with her in the future.   Review of Systems: Constitutional:  no weight loss, fever, night sweats and fatigued Eyes: dry eyes and uses contacts ENT: she has headaches occasionally. Cardiovascular: no chest pain or dyspnea on exertion Respiratory: she has occasional cough due to sinus drainage but denies any wheezing or dyspnea on exertion Neurological: no TIA or stroke symptoms Dermatological: Having dry skin Gastrointestinal: no abdominal pain, change in bowel habits, or black or bloody stools Genito-Urinary: no dysuria, trouble voiding, or hematuria Hematological and  Lymphatic: negative Breast: negative Musculoskeletal: positive for -joint pain and occasional pain in back - left, lower which is chronic in nature. Remaining ROS negative.   Medications:   I have reviewed the patient's current medications.  Current Outpatient Prescriptions  Medication Sig Dispense Refill  . atorvastatin (LIPITOR) 10 MG tablet Take 10 mg by mouth daily.        . calcium carbonate (OS-CAL) 600 MG TABS Take 600 mg by mouth 2 (two) times daily with a meal.        .  Carboxymethylcellulose Sodium (REFRESH OP) Apply 1 drop to eye 2 (two) times daily as needed. For dry eyes      . HYDROcodone-acetaminophen (NORCO) 5-325 MG per tablet as needed.      Marland Kitchen losartan-hydrochlorothiazide (HYZAAR) 50-12.5 MG per tablet 1 tablet daily.       . Multiple Vitamin (MULTIVITAMIN) capsule Take 1 capsule by mouth daily.        . ondansetron (ZOFRAN) 8 MG tablet Take 1 tablet two times a day starting the day after chemo for 3 days.  Then take one tab two times a day as needed for nausea or vomiting.  30 tablet  1  . [DISCONTINUED] MICARDIS HCT 40-12.5 MG per tablet Take 1 tablet by mouth Daily.        Allergies:  Allergies  Allergen Reactions  . Dust Mite Extract Itching  . Other     grass    Physical Exam: Filed Vitals:   08/17/12 1305  BP: 143/80  Pulse: 68  Temp: 97.7 F (36.5 C)  Resp: 20    Body mass index is 34.96 kg/(m^2). Weight: 170 lbs. HEENT:  Sclerae anicteric, conjunctivae pink.  Oropharynx clear.  No mucositis or candidiasis.   Nodes:  No cervical, supraclavicular, or axillary lymphadenopathy palpated.  Breast Exam: Left mastectomy scar is benign with no evidence of any dominant mass effect. No evidence of skin nodules or changes. Right breast unremarkable Lungs:  Clear to auscultation bilaterally.  No crackles, rhonchi, or wheezes.   Heart:  Regular rate and rhythm.   Abdomen:  Soft, nontender.  Positive bowel sounds.  No  organomegaly or masses palpated.   Musculoskeletal:  No focal spinal tenderness to palpation.  Extremities:  Benign.  No peripheral edema or cyanosis.   Skin:  Benign.   Neuro:  Nonfocal, alert and oriented x 3   Lab Results:  Lab Results  Component Value Date   WBC 4.6 08/17/2012   HGB 13.9 08/17/2012   HCT 41.6 08/17/2012   PLT 175 08/17/2012   GLUCOSE 116* 08/17/2012   ALT 21 08/17/2012   AST 20 08/17/2012   NA 141 08/17/2012   K 3.6 08/17/2012   CL 106 08/17/2012   CREATININE 0.8 08/17/2012   BUN 9.7 08/17/2012   CO2 27  08/17/2012        Assessment:  Martha Pham is a 62 year old [place] woman with a history of a T2, N0, triple negative infiltrating ductal carcinoma of the left breast, for which she underwent an initial left lumpectomy with sentinel node dissection on 06/24/2011, followed by re-excision to positive margins, followed by a left simple mastectomy on 09/01/2011 due to positive margins. Following the mastectomy the margins were clear. Her last mammogram was on 05/13/2012 and was unremarkable.   Plan:  Martha Pham is doing well. She will return in 6 months for followup. She will follow up with Dr. Welton Flakes. This plan was reviewed with the patient, who voices understanding and agreement.  She knows to call with any changes or problems.    JOHNSON,SARAH, AOCNP-C, NP-C 08/17/2012

## 2012-08-18 ENCOUNTER — Encounter: Payer: Self-pay | Admitting: Nurse Practitioner

## 2012-08-31 ENCOUNTER — Telehealth: Payer: Self-pay | Admitting: Obstetrics and Gynecology

## 2012-09-01 ENCOUNTER — Ambulatory Visit: Payer: Self-pay | Admitting: Obstetrics and Gynecology

## 2012-09-06 ENCOUNTER — Telehealth: Payer: Self-pay | Admitting: Obstetrics and Gynecology

## 2012-09-07 ENCOUNTER — Ambulatory Visit: Payer: BC Managed Care – PPO | Admitting: Obstetrics and Gynecology

## 2012-09-07 ENCOUNTER — Encounter: Payer: Self-pay | Admitting: Obstetrics and Gynecology

## 2012-09-07 VITALS — BP 120/78 | Temp 98.1°F | Resp 16 | Ht 61.0 in | Wt 179.0 lb

## 2012-09-07 DIAGNOSIS — Z124 Encounter for screening for malignant neoplasm of cervix: Secondary | ICD-10-CM

## 2012-09-07 DIAGNOSIS — Z01419 Encounter for gynecological examination (general) (routine) without abnormal findings: Secondary | ICD-10-CM

## 2012-09-07 NOTE — Progress Notes (Signed)
Subjective:    Martha Pham is a 62 y.o. female No obstetric history on file. who presents for annual exam. The patient complaints of a vague pain in her left lower quadrant and her left back.  She has been evaluated by her primary physician.  Her workup has been negative.  She has had a negative colonoscopy. The patient has a history of breast cancer and is scheduled for breast reconstruction next month.  The patient now says that her sister may have had cervical cancer and not ovarian cancer.  The following portions of the patient's history were reviewed and updated as appropriate: allergies, current medications, past family history, past medical history, past social history, past surgical history and problem list.  Review of Systems Pertinent items are noted in HPI. Gastrointestinal:No change in bowel habits, no abdominal pain, no rectal bleeding Genitourinary:negative for dysuria, frequency, hematuria, nocturia and urinary incontinence    Objective:     BP 120/78  Temp(Src) 98.1 F (36.7 C) (Oral)  Resp 16  Ht 5\' 1"  (1.549 m)  Wt 179 lb (81.194 kg)  BMI 33.84 kg/m2  Weight:  Wt Readings from Last 1 Encounters:  09/07/12 179 lb (81.194 kg)     BMI: Body mass index is 33.84 kg/(m^2). General Appearance: Alert, appropriate appearance for age. No acute distress HEENT: Grossly normal Neck / Thyroid: Supple, no masses, nodes or enlargement Lungs: clear to auscultation bilaterally Back: No CVA tenderness Breast Exam: normal right breast exam with no masses or skin changes, scarring on the left breast but her incisions are well healed from her mastectomy. Cardiovascular: Regular rate and rhythm. S1, S2, no murmur Gastrointestinal: Soft, non-tender, no masses or organomegaly  ++++++++++++++++++++++++++++++++++++++++++++++++++++++++  Pelvic Exam: External genitalia: normal general appearance Vaginal: normal without tenderness, induration or masses and relaxation noted Cervix: flush  with the vaginal apex, atrophic Adnexa: normal bimanual exam Uterus: normal size shape and consistency Rectovaginal: normal rectal, no masses  ++++++++++++++++++++++++++++++++++++++++++++++++++++++++  Lymphatic Exam: Non-palpable nodes in neck, clavicular, axillary, or inguinal regions  Psychiatric: Alert and oriented, appropriate affect.      Assessment:    Normal gyn exam   Overweight or obese: Yes  Pelvic relaxation: Yes  Menopausal symptoms: No. Severe: No.   History of breast cancer  Left lower quadrant and left back pain of uncertain etiology   Plan:    the patient will continue her workup with her family physician for her pain   Follow-up:  for annual exam  The updated Pap smear screening guidelines were discussed with the patient. The patient requested that I obtain a Pap smear: No.  Kegel exercises discussed: Yes.  Proper diet and regular exercise were reviewed.  Annual mammograms recommended starting at age 30. Proper breast care was discussed.  Screening colonoscopy is recommended beginning at age 40.  Regular health maintenance was reviewed.  Sleep hygiene was discussed.   Leonard Schwartz M.D.   Regular Periods: no Mammogram: 04/2012 Mastectomy of LEFT BREAST   Monthly Breast Ex.: yes Exercise: no  Tetanus < 10 years: yes Seatbelts: yes  NI. Bladder Functn.: yes pt states she is not able to hold urine.  Abuse at home: no  Daily BM's: yes Stressful Work: yes  Healthy Diet: yes Sigmoid-Colonoscopy: "2013" Removed 3 polyps.   Calcium: yes Medical problems this year: abdominal pain/back pain    LAST PAP:2013 "WNL"   Contraception: NONE   Mammogram:  04/2012   PCP: Dr. Allyne Gee   PMH: Cancer Survivor   FMH:  oldest sister dx w/ ovarian cancer.   Last Bone Scan: 07/2012 "WNL"

## 2012-09-07 NOTE — Addendum Note (Signed)
Addended by: Tim Lair on: 09/07/2012 04:25 PM   Modules accepted: Orders

## 2012-09-20 ENCOUNTER — Encounter (HOSPITAL_BASED_OUTPATIENT_CLINIC_OR_DEPARTMENT_OTHER): Payer: Self-pay | Admitting: *Deleted

## 2012-09-20 NOTE — Progress Notes (Signed)
This is pt 4 th surg here, she has sleep apnea and does use a cpap-but has stayed RCC x2. Will come in for new ekg-and bmet-to bring cpap and all meds and overnight bag

## 2012-09-21 ENCOUNTER — Other Ambulatory Visit: Payer: Self-pay | Admitting: Plastic Surgery

## 2012-09-21 DIAGNOSIS — Z9012 Acquired absence of left breast and nipple: Secondary | ICD-10-CM

## 2012-09-21 NOTE — H&P (Signed)
This document contains confidential information from a [facility] medical record system and may be unauthenticated. Release may be made only with a valid authorization or in accordance with applicable policies of Medical Center or its affiliates. This document must be maintained in a secure manner or discarded/destroyed as required by Medical Center policy or by a confidential means such as shredding.   [name]   09/10/2012 11:45 AM Office Visit  MRN: [id number]   Department: [facility]  Dept Phone: [phone]   Description: Female DOB: [birth year]  Provider: Wayland Denis, DO    Diagnoses  -  Acquired absence of breast and absent nipple   - Primary   V45.71     Vitals - Last Recorded      137/84  98  1.524 m (5')  76.204 kg (168 lb)  32.81 kg/m2       Subjective:    Patient ID: Martha Pham is a 62 y.o. female.  HPI The patient is a 62 yrs old bf here for a history and physical for left breast reconstruction. The patient underwent reconstruction with expander and FlexHd placement. She underwent a left mastectomy and sentinel lymph node biopsy for triple negative left breast cancer February 2012. She completed her adjuvant chemotherapy in May 2013. She is doing very well and denies any fevers or chills. She has a fair amount of excess tissue in the area of the left breast. The right breast has grade III ptosis. Her incisions are well healed. She is pleased with her size and states she fits into a C cup well.  She has 540/350 cc in the left expander.   The following portions of the patient's history were reviewed and updated as appropriate: allergies, current medications, past family history, past medical history, past social history, past surgical history and problem list.  Review of Systems  Constitutional: Negative.   HENT: Negative.   Eyes: Negative.   Respiratory: Negative.   Cardiovascular: Negative.   Gastrointestinal: Negative.   Genitourinary:  Negative.   Musculoskeletal: Negative.   Psychiatric/Behavioral: Negative.       Objective:    Physical Exam  Constitutional: She appears well-developed and well-nourished.  HENT:   Head: Normocephalic and atraumatic.  Eyes: Conjunctivae normal are normal. Pupils are equal, round, and reactive to light.  Cardiovascular: Normal rate.   Pulmonary/Chest: Effort normal.  Abdominal: Soft. She exhibits no distension. There is no tenderness.  Neurological: She is alert.  Skin: Skin is warm.  Psychiatric: She has a normal mood and affect. Her behavior is normal.      Assessment:   1.  Acquired absence of breast and absent nipple        Plan:     This procedure has been fully reviewed with the patient and written informed consent has been obtained. Removal of left expander and placement of implant with reduction of right breast.     Medications Ordered This Encounter     cephalexin (KEFLEX) 500 MG capsule  Take 1 capsule (500 mg total) by mouth 4 times daily.    docusate sodium (COLACE) 100 MG capsule  Take 1 capsule (100 mg total) by mouth 2 times daily. -    HYDROcodone-acetaminophen (NORCO) 5-325 mg per tablet  Take 1 tablet by mouth every 6 (six) hours as needed for 10 days for Pain.    diazepam (VALIUM) 2 MG tablet Take 1 tablet (2 mg total) by mouth every 6 (six) hours as needed for 10  days for Anxiety.   Discontinued Medications     promethazine (PHENERGAN) 12.5 MG tablet      HYDROcodone-acetaminophen (NORCO) 5-325 mg per tablet      diazepam (VALIUM) 2 MG tablet

## 2012-09-22 ENCOUNTER — Other Ambulatory Visit: Payer: Self-pay

## 2012-09-22 ENCOUNTER — Encounter (HOSPITAL_BASED_OUTPATIENT_CLINIC_OR_DEPARTMENT_OTHER)
Admission: RE | Admit: 2012-09-22 | Discharge: 2012-09-22 | Disposition: A | Discharge status: Home / Self Care | Payer: BC Managed Care – PPO | Source: Ambulatory Visit | Attending: Plastic Surgery | Admitting: Plastic Surgery

## 2012-09-22 LAB — BASIC METABOLIC PANEL
BUN: 13 mg/dL (ref 6–23)
Calcium: 9.5 mg/dL (ref 8.4–10.5)
Creatinine, Ser: 0.71 mg/dL (ref 0.50–1.10)
GFR calc Af Amer: >90 mL/min (ref >90)
GFR calc non Af Amer: 90 mL/min — ABNORMAL LOW (ref >90)
Glucose, Bld: 114 mg/dL — ABNORMAL HIGH (ref 70–99)

## 2012-09-23 ENCOUNTER — Ambulatory Visit (HOSPITAL_BASED_OUTPATIENT_CLINIC_OR_DEPARTMENT_OTHER): Payer: BC Managed Care – PPO | Admitting: Anesthesiology

## 2012-09-23 ENCOUNTER — Encounter (HOSPITAL_BASED_OUTPATIENT_CLINIC_OR_DEPARTMENT_OTHER): Payer: Self-pay | Admitting: *Deleted

## 2012-09-23 ENCOUNTER — Encounter (HOSPITAL_BASED_OUTPATIENT_CLINIC_OR_DEPARTMENT_OTHER)
Admission: RE | Disposition: A | Discharge status: Home / Self Care | Payer: Self-pay | Source: Ambulatory Visit | Attending: Plastic Surgery

## 2012-09-23 ENCOUNTER — Ambulatory Visit (HOSPITAL_BASED_OUTPATIENT_CLINIC_OR_DEPARTMENT_OTHER)
Admission: RE | Admit: 2012-09-23 | Discharge: 2012-09-24 | Disposition: A | Discharge status: Home / Self Care | Payer: BC Managed Care – PPO | Source: Ambulatory Visit | Attending: Plastic Surgery | Admitting: Plastic Surgery

## 2012-09-23 ENCOUNTER — Encounter (HOSPITAL_BASED_OUTPATIENT_CLINIC_OR_DEPARTMENT_OTHER): Payer: Self-pay | Admitting: Anesthesiology

## 2012-09-23 DIAGNOSIS — N651 Disproportion of reconstructed breast: Secondary | ICD-10-CM | POA: Insufficient documentation

## 2012-09-23 DIAGNOSIS — Z9012 Acquired absence of left breast and nipple: Secondary | ICD-10-CM

## 2012-09-23 DIAGNOSIS — Z9010 Acquired absence of unspecified breast and nipple: Secondary | ICD-10-CM | POA: Insufficient documentation

## 2012-09-23 DIAGNOSIS — Z853 Personal history of malignant neoplasm of breast: Secondary | ICD-10-CM | POA: Insufficient documentation

## 2012-09-23 HISTORY — PX: BREAST REDUCTION SURGERY: SHX8

## 2012-09-23 HISTORY — PX: LIPOSUCTION: SHX10

## 2012-09-23 HISTORY — PX: PORT-A-CATH REMOVAL: SHX5289

## 2012-09-23 HISTORY — PX: BREAST IMPLANT EXCHANGE: SHX6296

## 2012-09-23 LAB — POCT HEMOGLOBIN-HEMACUE: Hemoglobin: 13.6 g/dL (ref 12.0–15.0)

## 2012-09-23 SURGERY — REPLACEMENT, IMPLANT, BREAST
Anesthesia: General | Site: Chest | Laterality: Right

## 2012-09-23 MED ORDER — EPHEDRINE SULFATE 50 MG/ML IJ SOLN
INTRAMUSCULAR | Status: DC | PRN
  Administered 2012-09-23 (×2): 10 mg via INTRAVENOUS
  Administered 2012-09-23: 15 mg via INTRAVENOUS

## 2012-09-23 MED ORDER — CEFAZOLIN SODIUM-DEXTROSE 2-3 GM-% IV SOLR
2.0000 g | INTRAVENOUS | Status: AC
  Administered 2012-09-23: 2 g via INTRAVENOUS

## 2012-09-23 MED ORDER — PANTOPRAZOLE SODIUM 40 MG IV SOLR: 40.0000 mg | Freq: Every day | INTRAVENOUS | Status: DC

## 2012-09-23 MED ORDER — KCL IN DEXTROSE-NACL 20-5-0.45 MEQ/L-%-% IV SOLN
INTRAVENOUS | Status: DC
  Administered 2012-09-23: 19:00:00 via INTRAVENOUS

## 2012-09-23 MED ORDER — HYDROCODONE-ACETAMINOPHEN 5-325 MG PO TABS
1.0000 | ORAL_TABLET | ORAL | Status: DC | PRN
  Administered 2012-09-23 – 2012-09-24 (×3): 2 via ORAL

## 2012-09-23 MED ORDER — GLYCOPYRROLATE 0.2 MG/ML IJ SOLN
INTRAMUSCULAR | Status: DC | PRN
  Administered 2012-09-23: 0.2 mg via INTRAVENOUS

## 2012-09-23 MED ORDER — DOCUSATE SODIUM 100 MG PO CAPS
100.0000 mg | ORAL_CAPSULE | Freq: Three times a day (TID) | ORAL | Status: DC
  Administered 2012-09-24: 100 mg via ORAL

## 2012-09-23 MED ORDER — ACETAMINOPHEN 325 MG PO TABS: 650.0000 mg | ORAL_TABLET | Freq: Four times a day (QID) | ORAL | Status: DC | PRN

## 2012-09-23 MED ORDER — MIDAZOLAM HCL 2 MG/2ML IJ SOLN: 1.0000 mg | INTRAMUSCULAR | Status: DC | PRN

## 2012-09-23 MED ORDER — SODIUM CHLORIDE 0.9 % IR SOLN
Status: DC | PRN
  Administered 2012-09-23: 12:00:00

## 2012-09-23 MED ORDER — MORPHINE SULFATE 2 MG/ML IJ SOLN: 2.0000 mg | INTRAMUSCULAR | Status: DC | PRN

## 2012-09-23 MED ORDER — OXYCODONE HCL 5 MG PO TABS: 5.0000 mg | ORAL_TABLET | Freq: Once | ORAL | Status: DC | PRN

## 2012-09-23 MED ORDER — PHENYLEPHRINE HCL 10 MG/ML IJ SOLN
INTRAMUSCULAR | Status: DC | PRN
  Administered 2012-09-23 (×4): 40 ug via INTRAVENOUS

## 2012-09-23 MED ORDER — HYDROMORPHONE HCL PF 1 MG/ML IJ SOLN: 0.5000 mg | Freq: Four times a day (QID) | INTRAMUSCULAR | Status: DC | PRN

## 2012-09-23 MED ORDER — ONDANSETRON HCL 4 MG/2ML IJ SOLN
4.0000 mg | Freq: Four times a day (QID) | INTRAMUSCULAR | Status: DC | PRN
  Administered 2012-09-23 – 2012-09-24 (×2): 4 mg via INTRAVENOUS

## 2012-09-23 MED ORDER — LACTATED RINGERS IV SOLN
INTRAVENOUS | Status: DC
  Administered 2012-09-23 (×2): via INTRAVENOUS

## 2012-09-23 MED ORDER — LIDOCAINE HCL 1 % IJ SOLN
INTRAVENOUS | Status: DC | PRN
  Administered 2012-09-23: 12:00:00 via INTRAMUSCULAR

## 2012-09-23 MED ORDER — PROPOFOL 10 MG/ML IV BOLUS
INTRAVENOUS | Status: DC | PRN
  Administered 2012-09-23: 200 mg via INTRAVENOUS

## 2012-09-23 MED ORDER — OXYCODONE HCL 5 MG/5ML PO SOLN: 5.0000 mg | Freq: Once | ORAL | Status: DC | PRN

## 2012-09-23 MED ORDER — FENTANYL CITRATE 0.05 MG/ML IJ SOLN: 50.0000 ug | INTRAMUSCULAR | Status: DC | PRN

## 2012-09-23 MED ORDER — ACETAMINOPHEN 650 MG RE SUPP: 650.0000 mg | Freq: Four times a day (QID) | RECTAL | Status: DC | PRN

## 2012-09-23 MED ORDER — FENTANYL CITRATE 0.05 MG/ML IJ SOLN
INTRAMUSCULAR | Status: DC | PRN
  Administered 2012-09-23 (×4): 25 ug via INTRAVENOUS
  Administered 2012-09-23: 50 ug via INTRAVENOUS
  Administered 2012-09-23 (×3): 25 ug via INTRAVENOUS
  Administered 2012-09-23: 100 ug via INTRAVENOUS

## 2012-09-23 MED ORDER — DEXAMETHASONE SODIUM PHOSPHATE 4 MG/ML IJ SOLN
INTRAMUSCULAR | Status: DC | PRN
  Administered 2012-09-23: 10 mg via INTRAVENOUS

## 2012-09-23 MED ORDER — ACETAMINOPHEN 10 MG/ML IV SOLN
1000.0000 mg | Freq: Once | INTRAVENOUS | Status: AC
  Administered 2012-09-23: 1000 mg via INTRAVENOUS

## 2012-09-23 MED ORDER — HYDROMORPHONE HCL PF 1 MG/ML IJ SOLN
0.2500 mg | INTRAMUSCULAR | Status: DC | PRN
  Administered 2012-09-23 (×4): 0.5 mg via INTRAVENOUS

## 2012-09-23 MED ORDER — MIDAZOLAM HCL 5 MG/5ML IJ SOLN
INTRAMUSCULAR | Status: DC | PRN
  Administered 2012-09-23: 2 mg via INTRAVENOUS

## 2012-09-23 MED ORDER — HYDROMORPHONE HCL PF 1 MG/ML IJ SOLN: 0.2500 mg | INTRAMUSCULAR | Status: DC | PRN

## 2012-09-23 MED ORDER — SODIUM CHLORIDE 0.9 % IV SOLN
3.0000 g | Freq: Four times a day (QID) | INTRAVENOUS | Status: DC
  Administered 2012-09-23 – 2012-09-24 (×2): 3 g via INTRAVENOUS

## 2012-09-23 SURGICAL SUPPLY — 80 items
ADH SKN CLS APL DERMABOND .7 (GAUZE/BANDAGES/DRESSINGS) ×8
BAG DECANTER FOR FLEXI CONT (MISCELLANEOUS) ×5 IMPLANT
BANDAGE GAUZE ELAST BULKY 4 IN (GAUZE/BANDAGES/DRESSINGS) ×10 IMPLANT
BINDER BREAST LRG (GAUZE/BANDAGES/DRESSINGS) IMPLANT
BINDER BREAST MEDIUM (GAUZE/BANDAGES/DRESSINGS) IMPLANT
BINDER BREAST XLRG (GAUZE/BANDAGES/DRESSINGS) ×1 IMPLANT
BINDER BREAST XXLRG (GAUZE/BANDAGES/DRESSINGS) IMPLANT
BIOPATCH BLUE 3/4IN DISK W/1.5 (GAUZE/BANDAGES/DRESSINGS) IMPLANT
BIOPATCH RED 1 DISK 7.0 (GAUZE/BANDAGES/DRESSINGS) ×9 IMPLANT
BLADE HEX COATED 2.75 (ELECTRODE) ×5 IMPLANT
BLADE SURG 10 STRL SS (BLADE) ×12 IMPLANT
BLADE SURG 15 STRL LF DISP TIS (BLADE) ×8 IMPLANT
BLADE SURG 15 STRL SS (BLADE) ×10
CANISTER SUCTION 1200CC (MISCELLANEOUS) ×5 IMPLANT
CHLORAPREP W/TINT 26ML (MISCELLANEOUS) ×5 IMPLANT
CLOTH BEACON ORANGE TIMEOUT ST (SAFETY) ×5 IMPLANT
CORDS BIPOLAR (ELECTRODE) IMPLANT
COVER MAYO STAND STRL (DRAPES) ×5 IMPLANT
COVER TABLE BACK 60X90 (DRAPES) ×5 IMPLANT
DECANTER SPIKE VIAL GLASS SM (MISCELLANEOUS) IMPLANT
DERMABOND ADVANCED (GAUZE/BANDAGES/DRESSINGS) ×2
DERMABOND ADVANCED .7 DNX12 (GAUZE/BANDAGES/DRESSINGS) ×8 IMPLANT
DRAIN CHANNEL 19F RND (DRAIN) ×1 IMPLANT
DRAPE LAPAROSCOPIC ABDOMINAL (DRAPES) ×5 IMPLANT
DRSG PAD ABDOMINAL 8X10 ST (GAUZE/BANDAGES/DRESSINGS) ×10 IMPLANT
DRSG TEGADERM 2-3/8X2-3/4 SM (GAUZE/BANDAGES/DRESSINGS) IMPLANT
ELECT BLADE 4.0 EZ CLEAN MEGAD (MISCELLANEOUS) ×10
ELECT REM PT RETURN 9FT ADLT (ELECTROSURGICAL) ×5
ELECTRODE BLDE 4.0 EZ CLN MEGD (MISCELLANEOUS) ×4 IMPLANT
ELECTRODE REM PT RTRN 9FT ADLT (ELECTROSURGICAL) ×4 IMPLANT
EVACUATOR SILICONE 100CC (DRAIN) ×1 IMPLANT
GAUZE SPONGE 4X4 12PLY STRL LF (GAUZE/BANDAGES/DRESSINGS) IMPLANT
GLOVE BIO SURGEON STRL SZ 6.5 (GLOVE) ×12 IMPLANT
GLOVE BIOGEL PI IND STRL 6.5 (GLOVE) IMPLANT
GLOVE BIOGEL PI INDICATOR 6.5 (GLOVE) ×1
GLOVE ECLIPSE 6.5 STRL STRAW (GLOVE) ×1 IMPLANT
GLOVE SKINSENSE NS SZ6.5 (GLOVE)
GLOVE SKINSENSE NS SZ7.0 (GLOVE) ×2
GLOVE SKINSENSE STRL SZ6.5 (GLOVE) IMPLANT
GLOVE SKINSENSE STRL SZ7.0 (GLOVE) IMPLANT
GOWN PREVENTION PLUS XLARGE (GOWN DISPOSABLE) ×13 IMPLANT
IMPL BREAST GEL 535CC (Breast) IMPLANT
IMPLANT BREAST GEL 535CC (Breast) ×5 IMPLANT
IV NS 1000ML (IV SOLUTION)
IV NS 1000ML BAXH (IV SOLUTION) IMPLANT
IV NS 500ML (IV SOLUTION) ×5
IV NS 500ML BAXH (IV SOLUTION) ×4 IMPLANT
KIT FILL MCGHAN 30CC (MISCELLANEOUS) IMPLANT
NDL HYPO 25X1 1.5 SAFETY (NEEDLE) IMPLANT
NDL SAFETY ECLIPSE 18X1.5 (NEEDLE) IMPLANT
NDL SPNL 20GX3.5 QUINCKE YW (NEEDLE) IMPLANT
NDL SPNL 22GX3.5 QUINCKE BK (NEEDLE) ×4 IMPLANT
NEEDLE HYPO 18GX1.5 SHARP (NEEDLE) ×10
NEEDLE HYPO 25X1 1.5 SAFETY (NEEDLE) IMPLANT
NEEDLE SPNL 20GX3.5 QUINCKE YW (NEEDLE) ×5 IMPLANT
NEEDLE SPNL 22GX3.5 QUINCKE BK (NEEDLE) ×5 IMPLANT
NS IRRIG 1000ML POUR BTL (IV SOLUTION) ×5 IMPLANT
PACK BASIN DAY SURGERY FS (CUSTOM PROCEDURE TRAY) ×5 IMPLANT
PENCIL BUTTON HOLSTER BLD 10FT (ELECTRODE) ×7 IMPLANT
PIN SAFETY STERILE (MISCELLANEOUS) ×1 IMPLANT
SET ASPIRATION TUBING (TUBING) ×1 IMPLANT
SIZER BREAST HP REUSE 500CC (SIZER) ×5
SIZER BRST HP REUSE 500CC (SIZER) IMPLANT
SLEEVE SCD COMPRESS KNEE MED (MISCELLANEOUS) ×5 IMPLANT
SPONGE LAP 18X18 X RAY DECT (DISPOSABLE) ×13 IMPLANT
SUT MON AB 5-0 PS2 18 (SUTURE) ×13 IMPLANT
SUT PDS 3-0 CT2 (SUTURE) ×10
SUT PDS AB 2-0 CT2 27 (SUTURE) IMPLANT
SUT PDS II 3-0 CT2 27 ABS (SUTURE) IMPLANT
SUT SILK 3 0 PS 1 (SUTURE) ×1 IMPLANT
SUT VIC AB 3-0 SH 27 (SUTURE) ×40
SUT VIC AB 3-0 SH 27X BRD (SUTURE) ×8 IMPLANT
SUT VICRYL 4-0 PS2 18IN ABS (SUTURE) ×17 IMPLANT
SYR 50ML LL SCALE MARK (SYRINGE) ×2 IMPLANT
SYR BULB IRRIGATION 50ML (SYRINGE) ×5 IMPLANT
SYR CONTROL 10ML LL (SYRINGE) IMPLANT
TOWEL OR 17X24 6PK STRL BLUE (TOWEL DISPOSABLE) ×10 IMPLANT
TUBE CONNECTING 20X1/4 (TUBING) ×5 IMPLANT
UNDERPAD 30X30 INCONTINENT (UNDERPADS AND DIAPERS) ×10 IMPLANT
YANKAUER SUCT BULB TIP NO VENT (SUCTIONS) ×5 IMPLANT

## 2012-09-23 NOTE — Brief Op Note (Signed)
09/23/2012  2:58 PM  PATIENT:  Martha Pham  62 y.o. female  PRE-OPERATIVE DIAGNOSIS:  BREAST CANCER  POST-OPERATIVE DIAGNOSIS:  BREAST CANCER  PROCEDURE:  Procedure(s): REMOVAL OF LEFT EXPANDER/PLACEMENT OF IMPLANT WITH REDUCTION OF RIGHT BREAST (Left) MAMMARY REDUCTION  (BREAST) (Right) REMOVAL PORT-A-CATH (Right) LIPOSUCTION (Bilateral)  SURGEON:  Surgeon(s) and Role:    * Claire Sanger, DO - Primary  PHYSICIAN ASSISTANT: Shawn Rayburn, PA  ASSISTANTS: none   ANESTHESIA:   general  EBL:     BLOOD ADMINISTERED:none  DRAINS: (one) Jackson-Pratt drain(s) with closed bulb suction in the right breast   LOCAL MEDICATIONS USED:  LIDOCAINE   SPECIMEN:  Source of Specimen:  left and right breast tissue  DISPOSITION OF SPECIMEN:  PATHOLOGY  COUNTS:  YES  TOURNIQUET:  * No tourniquets in log *  DICTATION: .Dragon Dictation  PLAN OF CARE: Discharge to home after PACU  PATIENT DISPOSITION:  PACU - hemodynamically stable.   Delay start of Pharmacological VTE agent (>24hrs) due to surgical blood loss or risk of bleeding: no

## 2012-09-23 NOTE — Anesthesia Preprocedure Evaluation (Signed)
Anesthesia Evaluation  Patient identified by MRN, date of birth, ID band Patient awake    Reviewed: Allergy & Precautions, H&P , NPO status , Patient's Chart, lab work & pertinent test results  Airway Mallampati: III TM Distance: >3 FB Neck ROM: Full    Dental no notable dental hx. (+) Edentulous Upper and Dental Advisory Given   Pulmonary sleep apnea and Continuous Positive Airway Pressure Ventilation ,  breath sounds clear to auscultation  Pulmonary exam normal       Cardiovascular hypertension, On Medications Rhythm:Regular Rate:Normal     Neuro/Psych negative neurological ROS  negative psych ROS   GI/Hepatic negative GI ROS, Neg liver ROS,   Endo/Other  negative endocrine ROS  Renal/GU negative Renal ROS  negative genitourinary   Musculoskeletal   Abdominal   Peds  Hematology negative hematology ROS (+)   Anesthesia Other Findings   Reproductive/Obstetrics negative OB ROS                           Anesthesia Physical Anesthesia Plan  ASA: III  Anesthesia Plan: General   Post-op Pain Management:    Induction: Intravenous  Airway Management Planned: LMA and Oral ETT  Additional Equipment:   Intra-op Plan:   Post-operative Plan: Extubation in OR  Informed Consent: I have reviewed the patients History and Physical, chart, labs and discussed the procedure including the risks, benefits and alternatives for the proposed anesthesia with the patient or authorized representative who has indicated his/her understanding and acceptance.   Dental advisory given  Plan Discussed with: CRNA and Surgeon  Anesthesia Plan Comments:         Anesthesia Quick Evaluation

## 2012-09-23 NOTE — Op Note (Signed)
Breast Reduction Op note:  Alan Ripper Sanger  ASSISTANT: Shawn Rayburn, PA  PREOPERATIVE DIAGNOSIS 1. Right breast asymmetry 2. Left acquired absence of breast secondary to breast cancer 3. Port-a-cath in place  POSTOPERATIVE DIAGNOSIS 1. Right breast asymmetry 2. Left acquired absence of breast secondary to breast cancer 3. Port-a-cath in place  PROCEDURES 1. Left breast expander removal and placement of mentor 535 cc smooth round ultra high profile 2. Right breast reduction.  902 g  COMPLICATIONS: None.  DRAINS: JP x1  INDICATIONS FOR PROCEDURE The patient is a 62 y.o. year-old female with a history of breast cancer and breast asymmetry after a mastectomy.    CONSENT Informed consent was obtained directly from the patient. The risks, benefits and alternatives were fully discussed. Specific risks including but not limited to bleeding, infection, hematoma, seroma, scarring, pain, nipple necrosis, asymmetry, poor cosmetic results, and need for further surgery were discussed. The patient had ample opportunity to have her questions answered to her satisfaction.  DESCRIPTION OF PROCEDURE  Patient was brought into the operating room and placed in a supine position.  IV antibiotics were given. SCDs were placed and appropriate padding was performed.  The patient underwent general anesthesia and the chest was prepped and draped in a sterile fashion.  A timeout was performed. The patient's chest was prepped and draped in a sterile fashion. A time out was performed and the implants to be used were identified.    The left breast mastectomy scar was opened and superior mastectomy and inferior mastectomy flaps were re-raised over the pectoralis major muscle. The pectoralis was split to expose the tissue expander which was removed. Inspection of the pocket showed a normal healthy capsule and good integration of the biologic matrix.  A capsulotomy was performed to allow for breast pocket expansion and  the lateral portion of the capsule was excised.  The capsule was then sutured together to move the implant more medially.  Measurements were made on either side to confirm adequate pocket size for the implant dimensions.  Hemostasis was ensured. The 500 cc mentor sizer was placed and then the Mentorsmooth round ultra high profile 535 cc implant was selected. The implant was placed in the pockety. The pectoralis major muscle and capsule on the anterior surface were re-closed with a 3-0 vicryl suture followed by a 3-0 vicryl. The remaining skin was closed with 5-0 monocryl subcuticular stitches.   Preoperative markings were confirmed.  The right breast medial and lateral flaps were injected with tumecense (NS,1% Xylocaine with epinephrine).  After waiting for vasoconstriction, the marked lines were incised.  A Wise-pattern superomedial breast reduction was performed by de-epithelializing the pedicle, using bovie to create the superomedial pedicle, and removing breast tissue from the superior, lateral, and inferior portions of the breast.  Care was taken to not undermine the breast pedicle. Hemostasis was achieved.  The nipple was gently rotated into position and tacked with 4-0 vicryl.  The patient was sat upright and size and shape symmetry was confirmed.  The pocket was irrigated, a drain placed, and hemostasis confirmed.  The deep tissues were approximated with 3-0 vicryl followed by 4-0 vicryl sutures and the skin was closed with deep dermal and subcuticular 5-0 Monocryl sutures.  The nipple and skin flaps had good capillary refill at the end of the procedure. Dermabond was placed on all incisions.    The port-a-cath was removed through the previous incision.  A #15 blade was used to excise the previous scar.  A 4-0  vicryl was placed at the base of the tube.  The deep layers were closed with 4-0 vicryl followed by 5-0 monocryl to re-approximate the skin edges. Dermabond was applied to the skin. The patient  tolerated the procedure well. The patient was allowed to wake up, extubated and taken to the recovery room in satisfactory condition. There were no complications. A breast binder was placed with ADBs.

## 2012-09-23 NOTE — Anesthesia Postprocedure Evaluation (Signed)
  Anesthesia Post-op Note  Patient: Martha Pham  Procedure(s) Performed: Procedure(s): REMOVAL OF LEFT EXPANDER/PLACEMENT OF IMPLANT WITH REDUCTION OF RIGHT BREAST (Left) MAMMARY REDUCTION  (BREAST) (Right) REMOVAL PORT-A-CATH (Right) LIPOSUCTION (Bilateral)  Patient Location: PACU  Anesthesia Type:General  Level of Consciousness: awake, alert , oriented and patient cooperative  Airway and Oxygen Therapy: Patient Spontanous Breathing and Patient connected to nasal cannula oxygen  Post-op Pain: mild  Post-op Assessment: Post-op Vital signs reviewed, Patient's Cardiovascular Status Stable, Respiratory Function Stable, Patent Airway, No signs of Nausea or vomiting and Pain level controlled  Post-op Vital Signs: Reviewed and stable  Complications: No apparent anesthesia complications, Dr. Kelly Splinter addressing bleeding.

## 2012-09-23 NOTE — Transfer of Care (Signed)
Immediate Anesthesia Transfer of Care Note  Patient: Martha Pham  Procedure(s) Performed: Procedure(s): REMOVAL OF LEFT EXPANDER/PLACEMENT OF IMPLANT WITH REDUCTION OF RIGHT BREAST (Left) MAMMARY REDUCTION  (BREAST) (Right) REMOVAL PORT-A-CATH (Right) LIPOSUCTION (Bilateral)  Patient Location: PACU  Anesthesia Type:General  Level of Consciousness: awake and patient cooperative  Airway & Oxygen Therapy: Patient Spontanous Breathing and Patient connected to face mask oxygen  Post-op Assessment: Report given to PACU RN and Post -op Vital signs reviewed and stable  Post vital signs: Reviewed and stable  Complications: No apparent anesthesia complications

## 2012-09-23 NOTE — H&P (View-Only) (Signed)
This document contains confidential information from a [facility] medical record system and may be unauthenticated. Release may be made only with a valid authorization or in accordance with applicable policies of Medical Center or its affiliates. This document must be maintained in a secure manner or discarded/destroyed as required by Medical Center policy or by a confidential means such as shredding.   Martha Pham   09/10/2012 11:45 AM Office Visit  MRN: [id number]   Department: [facility]  Dept Phone: [phone]   Description: Female DOB: 06/06/1951  Provider: Belton Peplinski Sanger, DO    Diagnoses  -  Acquired absence of breast and absent nipple   - Primary   V45.71     Vitals - Last Recorded      137/84  98  1.524 m (5')  76.204 kg (168 lb)  32.81 kg/m2       Subjective:    Patient ID: Martha Pham is a 61 y.o. female.  HPI The patient is a 61 yrs old bf here for a history and physical for left breast reconstruction. The patient underwent reconstruction with expander and FlexHd placement. She underwent a left mastectomy and sentinel lymph node biopsy for triple negative left breast cancer February 2012. She completed her adjuvant chemotherapy in May 2013. She is doing very well and denies any fevers or chills. She has a fair amount of excess tissue in the area of the left breast. The right breast has grade III ptosis. Her incisions are well healed. She is pleased with her size and states she fits into a C cup well.  She has 540/350 cc in the left expander.   The following portions of the patient's history were reviewed and updated as appropriate: allergies, current medications, past family history, past medical history, past social history, past surgical history and problem list.  Review of Systems  Constitutional: Negative.   HENT: Negative.   Eyes: Negative.   Respiratory: Negative.   Cardiovascular: Negative.   Gastrointestinal: Negative.   Genitourinary:  Negative.   Musculoskeletal: Negative.   Psychiatric/Behavioral: Negative.       Objective:    Physical Exam  Constitutional: She appears well-developed and well-nourished.  HENT:   Head: Normocephalic and atraumatic.  Eyes: Conjunctivae normal are normal. Pupils are equal, round, and reactive to light.  Cardiovascular: Normal rate.   Pulmonary/Chest: Effort normal.  Abdominal: Soft. She exhibits no distension. There is no tenderness.  Neurological: She is alert.  Skin: Skin is warm.  Psychiatric: She has a normal mood and affect. Her behavior is normal.      Assessment:   1.  Acquired absence of breast and absent nipple        Plan:     This procedure has been fully reviewed with the patient and written informed consent has been obtained. Removal of left expander and placement of implant with reduction of right breast.     Medications Ordered This Encounter     cephalexin (KEFLEX) 500 MG capsule  Take 1 capsule (500 mg total) by mouth 4 times daily.    docusate sodium (COLACE) 100 MG capsule  Take 1 capsule (100 mg total) by mouth 2 times daily. -    HYDROcodone-acetaminophen (NORCO) 5-325 mg per tablet  Take 1 tablet by mouth every 6 (six) hours as needed for 10 days for Pain.    diazepam (VALIUM) 2 MG tablet Take 1 tablet (2 mg total) by mouth every 6 (six) hours as needed for 10   days for Anxiety.   Discontinued Medications     promethazine (PHENERGAN) 12.5 MG tablet      HYDROcodone-acetaminophen (NORCO) 5-325 mg per tablet      diazepam (VALIUM) 2 MG tablet             

## 2012-09-23 NOTE — Interval H&P Note (Signed)
History and Physical Interval Note:  09/23/2012 9:39 AM  Kittie Plater  has presented today for surgery, with the diagnosis of BREAST CANCER  The various methods of treatment have been discussed with the patient and family. After consideration of risks, benefits and other options for treatment, the patient has consented to  Procedure(s): REMOVAL OF LEFT EXPANDER/PLACEMENT OF IMPLANT WITH REDUCTION OF RIGHT BREAST (Left) MAMMARY REDUCTION  (BREAST) (Bilateral) as a surgical intervention .  The patient's history has been reviewed, patient examined, no change in status, stable for surgery.  I have reviewed the patient's chart and labs.  Questions were answered to the patient's satisfaction.     SANGER,Janiqua Friscia

## 2012-09-27 ENCOUNTER — Encounter (HOSPITAL_BASED_OUTPATIENT_CLINIC_OR_DEPARTMENT_OTHER): Payer: Self-pay | Admitting: Plastic Surgery

## 2012-10-13 ENCOUNTER — Ambulatory Visit (INDEPENDENT_AMBULATORY_CARE_PROVIDER_SITE_OTHER): Payer: BC Managed Care – PPO | Admitting: General Surgery

## 2012-10-13 ENCOUNTER — Encounter (INDEPENDENT_AMBULATORY_CARE_PROVIDER_SITE_OTHER): Payer: Self-pay | Admitting: General Surgery

## 2012-10-13 VITALS — BP 136/72 | HR 74 | Temp 97.6°F | Ht 61.0 in | Wt 174.6 lb

## 2012-10-13 DIAGNOSIS — Z853 Personal history of malignant neoplasm of breast: Secondary | ICD-10-CM

## 2012-10-13 NOTE — Progress Notes (Signed)
Subjective:     Patient ID: Martha Pham, female   DOB: May 17, 1951, 62 y.o.   MRN: [id number]  HPI This patient follows up s/p left mastectomy for T2N0M0 cancer.  She is not taking any hormones.  She recently had left breast reconstruction with right breast reduction by Dr. Kelly Splinter.  She does have the surgery a couple weeks ago but she seems to be recovering okay. She has some discomfort but no other breast complaints. She had a right mammograms in October which was normal  Review of Systems     Objective:   Physical Exam Her right breast was without suspicious masses. There is no lymphadenopathy appreciated. Her incisions are healing fine. She has a little bit of  Breakdown at the inferior intersection of her right breast incision but no sign of infection. Her left breast incision seems to be healing fine. I do not appreciate any suspicious masses or lymphadenopathy on the left as well    Assessment:     History of left breast cancer-doing well From a cancer standpoint I think that she is doing very well. I do not see any evidence of recurrent tumor. Her right breast mammogram was normal in October. She is doing her self breast exams and has not noticed any suspicious lesions. I will have much else to add to this patient. I think that she is recovering fine.     Plan:     I recommend that she continue with her monthly self breast exam and that she follow up with her plastic surgeon. I will see her back in about one year for repeat breast exam. Recommended annual mammograms.

## 2013-02-17 ENCOUNTER — Telehealth: Payer: Self-pay | Admitting: *Deleted

## 2013-02-17 ENCOUNTER — Ambulatory Visit (HOSPITAL_BASED_OUTPATIENT_CLINIC_OR_DEPARTMENT_OTHER): Payer: BC Managed Care – PPO | Admitting: Oncology

## 2013-02-17 ENCOUNTER — Other Ambulatory Visit (HOSPITAL_BASED_OUTPATIENT_CLINIC_OR_DEPARTMENT_OTHER): Payer: BC Managed Care – PPO | Admitting: Lab

## 2013-02-17 ENCOUNTER — Encounter: Payer: Self-pay | Admitting: Oncology

## 2013-02-17 VITALS — BP 140/74 | HR 71 | Temp 98.5°F | Resp 20 | Ht 61.0 in | Wt 180.5 lb

## 2013-02-17 DIAGNOSIS — C50219 Malignant neoplasm of upper-inner quadrant of unspecified female breast: Secondary | ICD-10-CM

## 2013-02-17 DIAGNOSIS — E559 Vitamin D deficiency, unspecified: Secondary | ICD-10-CM

## 2013-02-17 LAB — COMPREHENSIVE METABOLIC PANEL (CC13)
AST: 15 U/L (ref 5–34)
Albumin: 3.8 g/dL (ref 3.5–5.0)
Alkaline Phosphatase: 84 U/L (ref 40–150)
Potassium: 3.8 mEq/L (ref 3.5–5.1)
Sodium: 142 mEq/L (ref 136–145)
Total Bilirubin: 0.47 mg/dL (ref 0.20–1.20)
Total Protein: 7.5 g/dL (ref 6.4–8.3)

## 2013-02-17 LAB — CANCER ANTIGEN 27.29: CA 27.29: 29 U/mL (ref 0–39)

## 2013-02-17 LAB — CBC WITH DIFFERENTIAL/PLATELET
BASO%: 1 % (ref 0.0–2.0)
EOS%: 2.2 % (ref 0.0–7.0)
HGB: 12.7 g/dL (ref 11.6–15.9)
MCH: 32.1 pg (ref 25.1–34.0)
MCHC: 33.1 g/dL (ref 31.5–36.0)
MONO%: 8.3 % (ref 0.0–14.0)
RBC: 3.96 10*6/uL (ref 3.70–5.45)
RDW: 13 % (ref 11.2–14.5)
lymph#: 1.6 10*3/uL (ref 0.9–3.3)

## 2013-02-17 NOTE — Progress Notes (Signed)
OFFICE PROGRESS NOTE  CC  Martha Aliment, MD [address] [place] [address] Dr. Kirkland Hun Dr. Lurline Hare Dr. Lodema Pilot  DIAGNOSIS: 62 year old female who was diagnosed with T2 N0 invasive ductal carcinoma of the left breast on a screening mammogram in October 2012.  PRIOR THERAPY:  #1 patient presented with new calcifications at the age of 34 on a screening mammogram. These were biopsied and were found to be grade 3 invasive ductal carcinoma the tumor was ER negative PR negative HER-2/neu negative with an elevated Ki-67. She had MRI of the breasts performed that showed the presence of a 2.7 x 2.3 x 1.0 cm mass in the left breast. Patient was seen in the multidisciplinary breast clinic.  #2 patient underwent a left breast needle localized lumpectomy with sentinel lymph node biopsy on 06/24/2011. The pathology showed a high-grade invasive ductal carcinoma that was triple negative. However she had positive margins. Therefore she had a reexcision. But in spite of patient had positive margins therefore in [place] 2013 patient underwent a mastectomy.  #3 she then received adjuvant Taxotere and Cytoxan every 21 days starting 10/01/2011 through 12/03/2011.  #4 patient then went on to have breast reconstruction on 05/05/2012 of the left breast with reduction of the right breast.  CURRENT THERAPY: Observation  INTERVAL HISTORY: Martha Pham 62 y.o. female returns for followup visit today. She had been seeing Dr. Beatris Ship on every 6 month basis. She will continue to do this without for a total of 5 years and then yearly for another 5 years. Clinically she seems to be doing well and is without any major complaints. She denies any fevers chills night sweats headaches shortness of breath chest pains palpitations no peripheral paresthesias no myalgias and arthralgias. Remainder of the 10 point review of systems is negative.  MEDICAL HISTORY: Past Medical History   Diagnosis Date  . Hypertension   . Hyperlipemia   . Cancer     lt. breast ca-snbx  . Arthritis   . Contact lens/glasses fitting     wears contacts or glasses  . Wears dentures     upper  . Sleep apnea     SLEEP STUDY DX 10,CPAP BUT DON'TUSE  . Apnea, sleep     does wear cpap    ALLERGIES:  is allergic to dust mite extract and other.  MEDICATIONS:  Current Outpatient Prescriptions  Medication Sig Dispense Refill  . atorvastatin (LIPITOR) 10 MG tablet Take 10 mg by mouth daily.        . calcium carbonate (OS-CAL) 600 MG TABS Take 600 mg by mouth 2 (two) times daily with a meal.        . Carboxymethylcellulose Sodium (REFRESH OP) Apply 1 drop to eye 2 (two) times daily as needed. For dry eyes      . losartan-hydrochlorothiazide (HYZAAR) 50-12.5 MG per tablet 1 tablet daily.       . Multiple Vitamin (MULTIVITAMIN) capsule Take 1 capsule by mouth daily.        . Vitamin D, Ergocalciferol, (DRISDOL) 50000 UNITS CAPS Take 50,000 Units by mouth.      . diazepam (VALIUM) 2 MG tablet       . fish oil-omega-3 fatty acids 1000 MG capsule Take 2 g by mouth daily.      Marland Kitchen HYDROcodone-acetaminophen (NORCO) 5-325 MG per tablet as needed.      . ondansetron (ZOFRAN) 8 MG tablet Take 1 tablet two times a day starting the day  after chemo for 3 days.  Then take one tab two times a day as needed for nausea or vomiting.  30 tablet  1  . [DISCONTINUED] MICARDIS HCT 40-12.5 MG per tablet Take 1 tablet by mouth Daily.       No current facility-administered medications for this visit.    SURGICAL HISTORY:  Past Surgical History  Procedure Laterality Date  . Tonsillectomy    . Mouth surgery      TOOTH IMPLANT BOTTOM X2 LFT  . Portacath placement  06/24/2011    Procedure: INSERTION PORT-A-CATH;  Surgeon: Rulon Abide, DO;  Location: [facility];  Service: General;  Laterality: Right;  Right mediport placement  . Simple mastectomy  09/01/2011    left  . Breast lumpectomy w/ needle  localization  06/24/2011    left  . Breast surgery  08/07/2011    re-exc. left breast margins  . Reconstruction breast immediate / delayed w/ tissue expander  10/13    left  . Breast implant exchange Left 09/23/2012    Procedure: REMOVAL OF LEFT EXPANDER/PLACEMENT OF IMPLANT WITH REDUCTION OF RIGHT BREAST;  Surgeon: Wayland Denis, DO;  Location: [facility];  Service: Plastics;  Laterality: Left;  . Breast reduction surgery Right 09/23/2012    Procedure: MAMMARY REDUCTION  (BREAST);  Surgeon: Wayland Denis, DO;  Location: [facility];  Service: Plastics;  Laterality: Right;  . Port-a-cath removal Right 09/23/2012    Procedure: REMOVAL PORT-A-CATH;  Surgeon: Wayland Denis, DO;  Location: [facility];  Service: Plastics;  Laterality: Right;  . Liposuction Bilateral 09/23/2012    Procedure: LIPOSUCTION;  Surgeon: Wayland Denis, DO;  Location: [facility];  Service: Plastics;  Laterality: Bilateral;    REVIEW OF SYSTEMS:  Pertinent items are noted in HPI.   HEALTH MAINTENANCE:  Mammogram 04/2012  PHYSICAL EXAMINATION: Blood pressure 140/74, pulse 71, temperature 98.5 F (36.9 C), temperature source Oral, resp. rate 20, height 5\' 1"  (1.549 m), weight 180 lb 8 oz (81.874 kg). Body mass index is 34.12 kg/(m^2). ECOG PERFORMANCE STATUS: 0 - Asymptomatic   General appearance: alert, cooperative and appears stated age Lymph nodes: Cervical, supraclavicular, and axillary nodes normal. Resp: clear to auscultation bilaterally Cardio: regular rate and rhythm GI: soft, non-tender; bowel sounds normal; no masses,  no organomegaly Extremities: extremities normal, atraumatic, no cyanosis or edema Neurologic: Grossly normal Left reconstructed breasts: Well healed surgical scar no evidence of masses or local recurrence. Right breast with scars from reduction, no masses nipple discharge  LABORATORY DATA: Lab Results  Component Value Date   WBC 4.4  02/17/2013   HGB 12.7 02/17/2013   HCT 38.4 02/17/2013   MCV 96.9 02/17/2013   PLT 175 02/17/2013      Chemistry      Component Value Date/Time   NA 142 02/17/2013 0915   NA 139 09/22/2012 1430   K 3.8 02/17/2013 0915   K 3.6 09/22/2012 1430   CL 105 09/22/2012 1430   CL 106 08/17/2012 1351   CO2 24 02/17/2013 0915   CO2 26 09/22/2012 1430   BUN 10.3 02/17/2013 0915   BUN 13 09/22/2012 1430   CREATININE 0.8 02/17/2013 0915   CREATININE 0.71 09/22/2012 1430      Component Value Date/Time   CALCIUM 9.6 02/17/2013 0915   CALCIUM 9.5 09/22/2012 1430   ALKPHOS 84 02/17/2013 0915   ALKPHOS 64 02/11/2012 1315   AST 15 02/17/2013 0915   AST 17 02/11/2012  1315   ALT 16 02/17/2013 0915   ALT 16 02/11/2012 1315   BILITOT 0.47 02/17/2013 0915   BILITOT 0.3 02/11/2012 1315       RADIOGRAPHIC STUDIES:  No results found.  ASSESSMENT: 62 year old female with  #1 history of triple-negative T2 N0 invasive ductal carcinoma of the left breast status post mastectomy with reconstruction in February 2013 with contralateral right breast reduction.  #2 post those. Patient underwent adjuvant chemotherapy under the supervision of Dr. Pierce Crane for a total of 4 cycles of Taxotere and Cytoxan given every 21 days. She apparently tolerated the treatment very well.  #3 patient is up-to-date on her mammograms as well as her gynecologic examinations.   PLAN:   #1 patient and I discussed the correct way of doing breast examinations. We discussed the importance of exercise and a healthy diet and a healthy lifestyle.  #2 patient will be seen back in 6 months time for followup.   All questions were answered. The patient knows to call the clinic with any problems, questions or concerns. We can certainly see the patient much sooner if necessary.  I spent 25 minutes counseling the patient face to face. The total time spent in the appointment was 30 minutes.    Drue Second, MD Medical/Oncology [facility] [phone] (beeper) [phone] (Office)  02/17/2013, 2:58 PM

## 2013-02-17 NOTE — Patient Instructions (Addendum)
Doing well  I will see you back in 6 months 

## 2013-02-17 NOTE — Telephone Encounter (Signed)
appts made and printed...td 

## 2013-04-20 ENCOUNTER — Other Ambulatory Visit: Payer: Self-pay

## 2013-04-20 DIAGNOSIS — Z1231 Encounter for screening mammogram for malignant neoplasm of breast: Secondary | ICD-10-CM

## 2013-05-06 ENCOUNTER — Telehealth: Payer: Self-pay | Admitting: *Deleted

## 2013-05-06 NOTE — Telephone Encounter (Signed)
Pt called [facility] states she has elevated BP 193/93, 187/91. Reviewed with NP returned pt call [facility] at home#, called [facility] s/w patient notified  Her to call PCP,. Pt advised she is on her way there right now. No further concerns

## 2013-05-13 ENCOUNTER — Ambulatory Visit
Admission: RE | Admit: 2013-05-13 | Discharge: 2013-05-13 | Disposition: A | Discharge status: Home / Self Care | Payer: BC Managed Care – PPO | Source: Ambulatory Visit

## 2013-05-13 DIAGNOSIS — Z1231 Encounter for screening mammogram for malignant neoplasm of breast: Secondary | ICD-10-CM

## 2013-08-21 HISTORY — PX: CYST REMOVAL NECK: SHX6281

## 2013-09-12 ENCOUNTER — Telehealth: Payer: Self-pay | Admitting: *Deleted

## 2013-09-12 ENCOUNTER — Encounter (INDEPENDENT_AMBULATORY_CARE_PROVIDER_SITE_OTHER): Payer: Self-pay | Admitting: General Surgery

## 2013-09-12 ENCOUNTER — Ambulatory Visit (HOSPITAL_BASED_OUTPATIENT_CLINIC_OR_DEPARTMENT_OTHER): Payer: BC Managed Care – PPO | Admitting: Oncology

## 2013-09-12 ENCOUNTER — Other Ambulatory Visit (HOSPITAL_BASED_OUTPATIENT_CLINIC_OR_DEPARTMENT_OTHER): Payer: BC Managed Care – PPO

## 2013-09-12 ENCOUNTER — Encounter: Payer: Self-pay | Admitting: Oncology

## 2013-09-12 ENCOUNTER — Encounter: Payer: Self-pay | Admitting: Specialist

## 2013-09-12 VITALS — BP 149/77 | HR 77 | Temp 97.6°F | Resp 18 | Ht 61.0 in | Wt 174.8 lb

## 2013-09-12 DIAGNOSIS — C50219 Malignant neoplasm of upper-inner quadrant of unspecified female breast: Secondary | ICD-10-CM

## 2013-09-12 DIAGNOSIS — Z171 Estrogen receptor negative status [ER-]: Secondary | ICD-10-CM

## 2013-09-12 DIAGNOSIS — E559 Vitamin D deficiency, unspecified: Secondary | ICD-10-CM

## 2013-09-12 DIAGNOSIS — R229 Localized swelling, mass and lump, unspecified: Secondary | ICD-10-CM

## 2013-09-12 LAB — CBC WITH DIFFERENTIAL/PLATELET
BASO%: 0.9 % (ref 0.0–2.0)
Basophils Absolute: 0 10*3/uL (ref 0.0–0.1)
EOS%: 1.1 % (ref 0.0–7.0)
Eosinophils Absolute: 0 10*3/uL (ref 0.0–0.5)
HEMATOCRIT: 39.6 % (ref 34.8–46.6)
HEMOGLOBIN: 13 g/dL (ref 11.6–15.9)
LYMPH#: 1.6 10*3/uL (ref 0.9–3.3)
LYMPH%: 35.6 % (ref 14.0–49.7)
MCH: 32.2 pg (ref 25.1–34.0)
MCHC: 32.9 g/dL (ref 31.5–36.0)
MCV: 98 fL (ref 79.5–101.0)
MONO#: 0.5 10*3/uL (ref 0.1–0.9)
MONO%: 10.2 % (ref 0.0–14.0)
NEUT%: 52.2 % (ref 38.4–76.8)
NEUTROS ABS: 2.4 10*3/uL (ref 1.5–6.5)
Platelets: 187 10*3/uL (ref 145–400)
RBC: 4.04 10*6/uL (ref 3.70–5.45)
RDW: 12.1 % (ref 11.2–14.5)
WBC: 4.5 10*3/uL (ref 3.9–10.3)

## 2013-09-12 LAB — COMPREHENSIVE METABOLIC PANEL (CC13)
ALBUMIN: 3.9 g/dL (ref 3.5–5.0)
ALT: 20 U/L (ref 0–55)
ANION GAP: 11 meq/L (ref 3–11)
AST: 18 U/L (ref 5–34)
Alkaline Phosphatase: 71 U/L (ref 40–150)
BUN: 12.6 mg/dL (ref 7.0–26.0)
CHLORIDE: 107 meq/L (ref 98–109)
CO2: 26 meq/L (ref 22–29)
Calcium: 9.9 mg/dL (ref 8.4–10.4)
Creatinine: 0.8 mg/dL (ref 0.6–1.1)
GLUCOSE: 107 mg/dL (ref 70–140)
POTASSIUM: 3.6 meq/L (ref 3.5–5.1)
SODIUM: 144 meq/L (ref 136–145)
TOTAL PROTEIN: 7.6 g/dL (ref 6.4–8.3)
Total Bilirubin: 0.53 mg/dL (ref 0.20–1.20)

## 2013-09-12 NOTE — Patient Instructions (Signed)
Breast Cancer Survivor Follow-Up Breast cancer begins when cells in the breast divide too rapidly. The extra cells form a lump (tumor). When the cancer is treated, the goal is to get rid of all cancer cells. However, sometimes a few cells survive. These cancer cells can then grow. They become recurrent cancer. This means the cancer comes back after treatment.  Most cases of recurrent breast cancer develop 3 to 5 years after treatment. However, sometimes it comes back just a few months after treatment. Other times, it does not come back until years later. If the cancer comes back in the same area as the first breast cancer, it is called a local recurrence. If the cancer comes back somewhere else in the body, it is called regional recurrence if the site is fairly near the breast or distant recurrence if it is far from the breast. Your caregiver may also use the term metastasize to indicate a cancer that has gone to another part of your body. Treatment is still possible after either kind of recurrence. The cancer can still be controlled.  CAUSES OF RECURRENT CANCER No one knows exactly why breast cancer starts in the first place. Why the cancer comes back after treatment is also not clear. It is known that certain conditions, called risk factors, can make this more likely. They include:  Developing breast cancer for the first time before age 60.  Having breast cancer that involves the lymph nodes. These are small, round pieces of tissue found all over the body. Their job is to help fight infections.  Having a large tumor. Cancer is more apt to come back if the first tumor was bigger than 2 inches (5 cm).  Having certain types of breast cancer, such as:  Inflammatory breast cancer. This rare type grows rapidly and causes the breast to become red and swollen.  A high-grade tumor. The grade of a tumor indicates how fast it will grow and spread. High-grade tumors grow more quickly than other types.  HER2  cancer. This refers to the tumor's genetic makeup. Tumors that have this type of gene are more likely to come back after treatment.  Having close tumor margins. This refers to the space between the tumor and normal, noncancerous cells. If the space is small, the tumor has a greater chance of coming back.  Having treatment involving a surgery to remove the tumor but not the entire breast (lumpectomy) and no radiation therapy. CARE AFTER BREAST CANCER Home Monitoring Women who have had breast cancer should continue to examine their breasts every month. The goal is to catch the cancer quickly if it comes back. Many women find it helpful to do so on the same day each month and to mark the calendar as a reminder. Let your caregiver know immediately if you have any signs of recurrent breast cancer. Symptoms will vary, depending on where the cancer recurs. The original type of treatment can also make a difference. Symptoms of local recurrence after a lumpectomy or a recurrence in the opposite breast may include:  A new lump or thickening in the breast.  A change in the way the skin looks on the breast (such as a rash, dimpling, or wrinkling).  Redness or swelling of the breast.  Changes in the nipple (such as being red, puckered, swollen, or leaking fluid). Symptoms of a recurrence after a breast removal surgery (mastectomy) may include:  A lump or thickening under the skin.  A thickening around the mastectomy scar. Symptoms   of regional recurrence in the lymph nodes near the breast may include:  A lump under the arm or above the collarbone.  Swelling of the arm.  Pain in the arm, shoulder, or chest.  Numbness in the hand or arm. Symptoms of distant recurrence may include:  A cough that does not go away.  Trouble breathing or shortness of breath.  Pain in the bones or the chest. This is pain that lasts or does not respond to rest and medicine.  Headaches.  Sudden vision  problems.  Dizziness.  Nausea or vomiting.  Losing weight without trying to.  Persistent abdominal pain.  Changes in bowel movements or blood in the stool.  Yellowing of the skin or eyes (jaundice).  Blood in the urine or bloody vaginal discharge. Clinical Monitoring  It is helpful to keep a schedule of appointments for needed tests and exams. This includes physical exams, breast exams, exams of the lymph nodes, and general exams.  For the first 3 years after being treated for breast cancer, see your caregiver every 3 to 6 months.  For years 4 and 5 after breast cancer, see your caregiver every 6 to 12 months.  After 5 years, see your caregiver at least once a year.  Regular breast X-rays (mammograms) should continue even if you had a mastectomy.  A mammogram should be done 1 year after the mammogram that first detected breast cancer.  A mammogram should be done every 6 to 12 months after that. Follow your caregiver's advice.  A pelvic exam done by your caregiver checks whether female organs are the normal size and shape. The exam is usually done every year. Ask your caregiver if that schedule is right for you.  Women taking tamoxifen should report any vaginal bleeding immediately to their caregiver. Tamoxifen is often given to women with a certain type of breast cancer. It has been shown to help prevent recurrence.  You will need to decide who your primary caregiver will be.  Most people continue to see their cancer specialist (oncologist) every 3 to 6 months for the first year after cancer treatment.  At some point, you may want to go back to seeing your family caregiver. You would no longer see your oncologist for regular checkups. Many women do this about 1 year after their first diagnosis of breast cancer.  You will still need to be seen every so often by your oncologist. Ask how often that should be. Coordinate this with your family or primary caregiver.  Think about  having genetic counseling. This would provide information on traits that can be passed or inherited from one generation to the next. In some cases, breast cancer runs in families. Tell your caregiver if you:  Are of Ashkenazi Jewish heritage.  Have any family member who has had ovarian cancer.  Have a mother, sister, or daughter who had breast cancer before age 7.  Have 2 or more close relatives who have had breast cancer. This means a mother, sister, daughter, aunt, or grandmother.  Had breast cancer in both breasts.  Have a female relative who has had breast cancer.  Some tests are not recommended for routine screening. Someone recovering from breast cancer does not need to have these tests if there are no problems. The tests have risks, such as radiation exposure, and can be costly. The risks of these tests are thought to be greater than the benefits:  Blood tests.  Chest X-rays.  Bone scans.  Liver ultrasound.  Computed tomography (CT scan).  Positron emission tomography (PET scan).  Magnetic resonance imaging (MRI scan). DIAGNOSIS OF RECURRENT CANCER Recurrent breast cancer may be suspected for various reasons. A mammogram may not look normal. You might feel a lump or have other symptoms. Your caregiver may find something unusual during an exam. To be sure, your caregiver will probably order some tests. The tests are needed because there are symptoms or hints of a problem. They could include:  Blood tests, including a test to check how well the liver is working. The liver is a common site for a distant cancer recurrence.  Imaging tests that create pictures of the inside of the body. These tests include:  Chest X-rays to show if the cancer has come back in the lungs.  CT scans to create detailed pictures of various areas of the body and help find a distant recurrence.  MRI scans to find anything unusual in the breast, chest, or lymph nodes.  Breast ultrasound tests to  examine the breasts.  Bone scans to create a picture of your whole skeleton and find cancer in bony areas.  PET scans to create an image of the whole body. PET scans can be used together with CT scans to show more detail.  Biopsy. A small sample of tissue is taken and checked under a microscope. If cancer cells are found, they may be tested to see if they contain the HER2 gene or the hormones estrogen and progesterone. This will help your caregiver decide how to treat the recurrent cancer. TREATMENT  How recurrent breast cancer is treated depends on where the new cancer is found. The type of treatment that was used for the first breast cancer makes a difference, too. A combination of treatments may be used. Options include:  Surgery.  If the cancer comes back in the breast that was not treated before, you may need a lumpectomy or mastectomy.  If the cancer comes back in the breast that was treated before, you may need a mastectomy.  The lymph nodes under the arm may need to be removed.  Radiation therapy.  For a local recurrence, radiation may be used if it was not used during the first treatment.  For a distance recurrence, radiation is sometimes used.  Chemotherapy.  This may be used before surgery to treat recurrent breast cancer.  This may be used to treat recurrent cancer that cannot be treated with surgery.  This may be used to treat a distant recurrence.  Hormone therapy.  Women with the HER2 gene may be given hormone therapy to attack this gene. Document Released: 03/05/2011 Document Revised: 09/29/2011 Document Reviewed: 03/05/2011 [employer] Patient Information 2014 [employer].  

## 2013-09-12 NOTE — Progress Notes (Signed)
OFFICE PROGRESS NOTE  CC  [name], MD [address] [place] [address] Dr. Ena Dawley Dr. Thea Silversmith Dr. Madilyn Hook  DIAGNOSIS: 63 year old female who was diagnosed with T2 N0 invasive ductal carcinoma of the left breast on a screening mammogram in October 2012.  PRIOR THERAPY:  #1 patient presented with new calcifications at the age of 18 on a screening mammogram. These were biopsied and were found to be grade 3 invasive ductal carcinoma the tumor was ER negative PR negative HER-2/neu negative with an elevated Ki-67. She had MRI of the breasts performed that showed the presence of a 2.7 x 2.3 x 1.0 cm mass in the left breast. Patient was seen in the multidisciplinary breast clinic.  #2 S/P left breast needle localized lumpectomy with sentinel lymph node biopsy on 06/24/2011. The pathology showed a high-grade invasive ductal carcinoma that was triple negative. However she had positive margins. Therefore she had a reexcision. But in spite of patient had positive margins therefore in [place] 2013 patient underwent a mastectomy.  #3 S/P adjuvant Taxotere and Cytoxan every 21 days starting 10/01/2011 through 12/03/2011.  #4 S/P breast reconstruction on 05/05/2012 of the left breast with reduction of the right breast.  CURRENT THERAPY: Observation  INTERVAL HISTORY: Martha Pham 63 y.o. female returns for followup visit today. Clinically she seems to be doing well and is without any major complaints. She has noticed a right posterior neck nodule. She tells me this was very small initially and over the 1 month has enlarged. It was painful. She was treated empirically with antibiotics. She is gong to be seeing Dr. Migdalia Dk for excision of this. She denies any fevers chills night sweats headaches shortness of breath chest pains palpitations no peripheral paresthesias no myalgias and arthralgias. Remainder of the 10 point review of systems is negative.  MEDICAL  HISTORY: Past Medical History  Diagnosis Date  . Hypertension   . Hyperlipemia   . Cancer     lt. breast ca-snbx  . Arthritis   . Contact lens/glasses fitting     wears contacts or glasses  . Wears dentures     upper  . Sleep apnea     SLEEP STUDY DX 10,CPAP BUT DON'TUSE  . Apnea, sleep     does wear cpap    ALLERGIES:  is allergic to dust mite extract and other.  MEDICATIONS:  Current Outpatient Prescriptions  Medication Sig Dispense Refill  . atorvastatin (LIPITOR) 10 MG tablet Take 10 mg by mouth daily.        . calcium carbonate (OS-CAL) 600 MG TABS Take 600 mg by mouth 2 (two) times daily with a meal.        . Carboxymethylcellulose Sodium (REFRESH OP) Apply 1 drop to eye 2 (two) times daily as needed. For dry eyes      . fish oil-omega-3 fatty acids 1000 MG capsule Take 2 g by mouth daily.      Marland Kitchen HYDROcodone-acetaminophen (NORCO) 5-325 MG per tablet as needed.      Marland Kitchen losartan-hydrochlorothiazide (HYZAAR) 50-12.5 MG per tablet 1 tablet daily.       . Multiple Vitamin (MULTIVITAMIN) capsule Take 1 capsule by mouth daily.        Marland Kitchen amoxicillin-clavulanate (AUGMENTIN) 500-125 MG per tablet       . diazepam (VALIUM) 2 MG tablet       . ondansetron (ZOFRAN) 8 MG tablet Take 1 tablet two times a day starting the day after chemo  for 3 days.  Then take one tab two times a day as needed for nausea or vomiting.  30 tablet  1  . Vitamin D, Ergocalciferol, (DRISDOL) 50000 UNITS CAPS Take 50,000 Units by mouth.      . [DISCONTINUED] MICARDIS HCT 40-12.5 MG per tablet Take 1 tablet by mouth Daily.       No current facility-administered medications for this visit.    SURGICAL HISTORY:  Past Surgical History  Procedure Laterality Date  . Tonsillectomy    . Mouth surgery      TOOTH IMPLANT BOTTOM X2 LFT  . Portacath placement  06/24/2011    Procedure: INSERTION PORT-A-CATH;  Surgeon: Judieth Keens, DO;  Location: [facility];  Service: General;  Laterality: Right;   Right mediport placement  . Simple mastectomy  09/01/2011    left  . Breast lumpectomy w/ needle localization  06/24/2011    left  . Breast surgery  08/07/2011    re-exc. left breast margins  . Reconstruction breast immediate / delayed w/ tissue expander  10/13    left  . Breast implant exchange Left 09/23/2012    Procedure: REMOVAL OF LEFT EXPANDER/PLACEMENT OF IMPLANT WITH REDUCTION OF RIGHT BREAST;  Surgeon: Theodoro Kos, DO;  Location: [facility];  Service: Plastics;  Laterality: Left;  . Breast reduction surgery Right 09/23/2012    Procedure: MAMMARY REDUCTION  (BREAST);  Surgeon: Theodoro Kos, DO;  Location: [facility];  Service: Plastics;  Laterality: Right;  . Port-a-cath removal Right 09/23/2012    Procedure: REMOVAL PORT-A-CATH;  Surgeon: Theodoro Kos, DO;  Location: [facility];  Service: Plastics;  Laterality: Right;  . Liposuction Bilateral 09/23/2012    Procedure: LIPOSUCTION;  Surgeon: Theodoro Kos, DO;  Location: [facility];  Service: Plastics;  Laterality: Bilateral;    REVIEW OF SYSTEMS:  Pertinent items are noted in HPI.   HEALTH MAINTENANCE:  Mammogram 04/2012  PHYSICAL EXAMINATION: Blood pressure 149/77, pulse 77, temperature 97.6 F (36.4 C), temperature source Oral, resp. rate 18, height 5' 1"  (1.549 m), weight 174 lb 12.8 oz (79.289 kg). Body mass index is 33.05 kg/(m^2). ECOG PERFORMANCE STATUS: 0 - Asymptomatic   General appearance: alert, cooperative and appears stated age Lymph nodes: Cervical, supraclavicular, and axillary nodes normal. Resp: clear to auscultation bilaterally Cardio: regular rate and rhythm GI: soft, non-tender; bowel sounds normal; no masses,  no organomegaly Extremities: extremities normal, atraumatic, no cyanosis or edema Neurologic: Grossly normal Left reconstructed breasts: Well healed surgical scar no evidence of masses or local recurrence. Right breast with scars from  reduction, no masses nipple discharge  LABORATORY DATA: Lab Results  Component Value Date   WBC 4.5 09/12/2013   HGB 13.0 09/12/2013   HCT 39.6 09/12/2013   MCV 98.0 09/12/2013   PLT 187 09/12/2013      Chemistry      Component Value Date/Time   NA 142 02/17/2013 0915   NA 139 09/22/2012 1430   K 3.8 02/17/2013 0915   K 3.6 09/22/2012 1430   CL 105 09/22/2012 1430   CL 106 08/17/2012 1351   CO2 24 02/17/2013 0915   CO2 26 09/22/2012 1430   BUN 10.3 02/17/2013 0915   BUN 13 09/22/2012 1430   CREATININE 0.8 02/17/2013 0915   CREATININE 0.71 09/22/2012 1430      Component Value Date/Time   CALCIUM 9.6 02/17/2013 0915   CALCIUM 9.5 09/22/2012 1430   ALKPHOS 84 02/17/2013 0915  ALKPHOS 64 02/11/2012 1315   AST 15 02/17/2013 0915   AST 17 02/11/2012 1315   ALT 16 02/17/2013 0915   ALT 16 02/11/2012 1315   BILITOT 0.47 02/17/2013 0915   BILITOT 0.3 02/11/2012 1315       RADIOGRAPHIC STUDIES:  No results found.  ASSESSMENT/PLAN: 63 year old female with  #1 Stage II triple-negative (T2 N0) invasive ductal carcinoma of the left breast status post mastectomy with reconstruction in February 2013 with contralateral right breast reduction.  #2 S/P adjuvant chemotherapy under the supervision of Dr. Eston Esters for a total of 4 cycles of Taxotere and Cytoxan given every 21 days. She apparently tolerated the treatment very well.  #3 patient is up-to-date on her mammograms as well as her gynecologic examinations. She is seeing her gyncologist today for a follow up.  #4 posterior neck nodule: On exam feels like a cyst. She is gong to have it removed by Dr. Theodoro Kos.  #5 survivorship: We discussed exercise eating healthy maintaining a good BMI. We also discussed how to recognize potential breast cancer recurrence.  #6 followup: Patient will be seen back in 6 months time. She also will have a mammogram of the right breast performed in October 2015.    All questions were answered. The patient knows to  call the clinic with any problems, questions or concerns. We can certainly see the patient much sooner if necessary.  I spent 20 minutes counseling the patient face to face. The total time spent in the appointment was 30 minutes.    Marcy Panning, MD Medical/Oncology [facility] [phone] (beeper) [phone] (Office)  09/12/2013, 10:28 AM

## 2013-09-12 NOTE — Progress Notes (Signed)
Met patient in lobby. Chaplain facilitated her narrative work concerning her illness. Patient talked about finishing treatment 3 years ago and how important the Breast Cancer support group has been for her.  Epifania Gore, [facility], PhD

## 2013-09-12 NOTE — Telephone Encounter (Signed)
appts made and printed...td 

## 2013-09-13 LAB — VITAMIN D 25 HYDROXY (VIT D DEFICIENCY, FRACTURES): Vit D, 25-Hydroxy: 56 ng/mL (ref 30–89)

## 2013-09-16 ENCOUNTER — Telehealth: Payer: Self-pay | Admitting: *Deleted

## 2013-09-16 NOTE — Telephone Encounter (Signed)
As noted below by Dr. Humphrey Rolls, Vitamin D level is good, and there are no changes to her medications. Patient verbalized understanding.

## 2013-09-16 NOTE — Telephone Encounter (Signed)
Message copied by Hebert Soho on Fri Sep 16, 2013  2:09 PM ------      Message from: Martha Pham      Created: Tue Sep 13, 2013  5:36 AM       Vitamin D level good, no changes, continue the same ------

## 2013-10-11 ENCOUNTER — Other Ambulatory Visit: Payer: Self-pay | Admitting: Obstetrics and Gynecology

## 2013-10-11 DIAGNOSIS — R109 Unspecified abdominal pain: Secondary | ICD-10-CM

## 2013-10-13 ENCOUNTER — Other Ambulatory Visit: Payer: BC Managed Care – PPO

## 2013-10-18 ENCOUNTER — Ambulatory Visit
Admission: RE | Admit: 2013-10-18 | Discharge: 2013-10-18 | Disposition: A | Discharge status: Home / Self Care | Payer: BC Managed Care – PPO | Source: Ambulatory Visit | Attending: Obstetrics and Gynecology | Admitting: Obstetrics and Gynecology

## 2013-10-18 DIAGNOSIS — R109 Unspecified abdominal pain: Secondary | ICD-10-CM

## 2013-11-21 DIAGNOSIS — H2510 Age-related nuclear cataract, unspecified eye: Secondary | ICD-10-CM | POA: Diagnosis not present

## 2013-11-21 DIAGNOSIS — H5200 Hypermetropia, unspecified eye: Secondary | ICD-10-CM | POA: Diagnosis not present

## 2013-11-30 ENCOUNTER — Ambulatory Visit (INDEPENDENT_AMBULATORY_CARE_PROVIDER_SITE_OTHER): Payer: Medicare PPO | Admitting: General Surgery

## 2013-11-30 ENCOUNTER — Encounter (INDEPENDENT_AMBULATORY_CARE_PROVIDER_SITE_OTHER): Payer: Self-pay | Admitting: General Surgery

## 2013-11-30 VITALS — BP 136/82 | HR 78 | Temp 97.7°F | Resp 12 | Ht 61.0 in | Wt 175.8 lb

## 2013-11-30 DIAGNOSIS — C50919 Malignant neoplasm of unspecified site of unspecified female breast: Secondary | ICD-10-CM

## 2013-11-30 NOTE — Patient Instructions (Signed)
Call if you find any new lumps in your breasts or chest wall. 

## 2013-11-30 NOTE — Progress Notes (Signed)
Procedures:  Left breast lumpectomy and left axilla sentinel lymph node biopsy December 2012. Reexcision of left breast lumpectomy site January 2013. Left simple mastectomy February 2013.  Pathology: T2N0 triple negative  Hx:  She is here for long-term followup of her left breast cancer status post the above procedures. She does not feel any nodules in her native skin on the left. No new breast masses on the right. She denies lymphadenopathy.  PE: General-Well-developed well-nourished in no acute distress.  Breasts-Right breast demonstrates scars consistent with breast reduction. No dominant masses. Left chest wall skin is without nodules. Implant in place  Lymph nodes-No palpable cervical, supraclavicular, or axillary adenopathy.  Assessment:  Left breast cancer-no clinical evidence of recurrence. Mammogram from the fall was BI-RADS 2.  Plan:  Return visit with me in 6 months unless she can find a replacement oncologist and then I will see her again in one year as the oncologist can see her back in 6 months.

## 2014-01-23 ENCOUNTER — Telehealth: Payer: Self-pay

## 2014-01-23 NOTE — Telephone Encounter (Signed)
Faxed dispensing order to [facility] for [phone] and [id number].  Sent to scan

## 2014-02-27 ENCOUNTER — Telehealth: Payer: Self-pay | Admitting: Hematology and Oncology

## 2014-02-27 NOTE — Telephone Encounter (Signed)
, °

## 2014-03-01 ENCOUNTER — Telehealth: Payer: Self-pay | Admitting: *Deleted

## 2014-03-01 NOTE — Telephone Encounter (Signed)
Lisabeth Devoid called requesting script for L nipple prosthesis.  Order faxed to [phone].

## 2014-03-02 ENCOUNTER — Telehealth: Payer: Self-pay

## 2014-03-02 NOTE — Telephone Encounter (Signed)
Order faxed to [facility] for [phone].  Sent to scan.

## 2014-03-17 ENCOUNTER — Ambulatory Visit: Payer: BC Managed Care – PPO | Admitting: Oncology

## 2014-03-17 ENCOUNTER — Other Ambulatory Visit: Payer: BC Managed Care – PPO

## 2014-03-24 ENCOUNTER — Other Ambulatory Visit: Payer: Self-pay

## 2014-03-24 DIAGNOSIS — C50219 Malignant neoplasm of upper-inner quadrant of unspecified female breast: Secondary | ICD-10-CM

## 2014-03-28 ENCOUNTER — Telehealth: Payer: Self-pay | Admitting: Hematology and Oncology

## 2014-03-28 ENCOUNTER — Other Ambulatory Visit (HOSPITAL_BASED_OUTPATIENT_CLINIC_OR_DEPARTMENT_OTHER): Payer: Medicare Other

## 2014-03-28 ENCOUNTER — Ambulatory Visit (HOSPITAL_BASED_OUTPATIENT_CLINIC_OR_DEPARTMENT_OTHER): Payer: Medicare Other | Admitting: Hematology and Oncology

## 2014-03-28 ENCOUNTER — Encounter: Payer: Self-pay | Admitting: Hematology and Oncology

## 2014-03-28 ENCOUNTER — Ambulatory Visit: Payer: Self-pay

## 2014-03-28 VITALS — BP 158/81 | HR 72 | Temp 98.1°F | Resp 18 | Ht 61.0 in | Wt 178.5 lb

## 2014-03-28 DIAGNOSIS — C50219 Malignant neoplasm of upper-inner quadrant of unspecified female breast: Secondary | ICD-10-CM

## 2014-03-28 DIAGNOSIS — M25559 Pain in unspecified hip: Secondary | ICD-10-CM | POA: Diagnosis not present

## 2014-03-28 DIAGNOSIS — Z853 Personal history of malignant neoplasm of breast: Secondary | ICD-10-CM

## 2014-03-28 LAB — CBC WITH DIFFERENTIAL/PLATELET
BASO%: 1 % (ref 0.0–2.0)
Basophils Absolute: 0.1 10*3/uL (ref 0.0–0.1)
EOS%: 2.2 % (ref 0.0–7.0)
Eosinophils Absolute: 0.1 10*3/uL (ref 0.0–0.5)
HEMATOCRIT: 39.1 % (ref 34.8–46.6)
HEMOGLOBIN: 12.7 g/dL (ref 11.6–15.9)
LYMPH%: 29.4 % (ref 14.0–49.7)
MCH: 32.4 pg (ref 25.1–34.0)
MCHC: 32.4 g/dL (ref 31.5–36.0)
MCV: 99.9 fL (ref 79.5–101.0)
MONO#: 0.6 10*3/uL (ref 0.1–0.9)
MONO%: 10.2 % (ref 0.0–14.0)
NEUT#: 3.5 10*3/uL (ref 1.5–6.5)
NEUT%: 57.2 % (ref 38.4–76.8)
Platelets: 187 10*3/uL (ref 145–400)
RBC: 3.92 10*6/uL (ref 3.70–5.45)
RDW: 12.1 % (ref 11.2–14.5)
WBC: 6.2 10*3/uL (ref 3.9–10.3)
lymph#: 1.8 10*3/uL (ref 0.9–3.3)

## 2014-03-28 LAB — COMPREHENSIVE METABOLIC PANEL (CC13)
ALT: 14 U/L (ref 0–55)
ANION GAP: 7 meq/L (ref 3–11)
AST: 17 U/L (ref 5–34)
Albumin: 3.7 g/dL (ref 3.5–5.0)
Alkaline Phosphatase: 69 U/L (ref 40–150)
BUN: 14.3 mg/dL (ref 7.0–26.0)
CO2: 26 meq/L (ref 22–29)
CREATININE: 0.9 mg/dL (ref 0.6–1.1)
Calcium: 9.3 mg/dL (ref 8.4–10.4)
Chloride: 112 mEq/L — ABNORMAL HIGH (ref 98–109)
GLUCOSE: 107 mg/dL (ref 70–140)
Potassium: 3.7 mEq/L (ref 3.5–5.1)
SODIUM: 145 meq/L (ref 136–145)
TOTAL PROTEIN: 7.6 g/dL (ref 6.4–8.3)
Total Bilirubin: 0.23 mg/dL (ref 0.20–1.20)

## 2014-03-28 MED ORDER — INFLUENZA VAC SPLIT QUAD 0.5 ML IM SUSY
0.5000 mL | PREFILLED_SYRINGE | Freq: Once | INTRAMUSCULAR | Status: DC
  Filled 2014-03-28: qty 0.5

## 2014-03-28 NOTE — Telephone Encounter (Signed)
, °

## 2014-03-28 NOTE — Progress Notes (Signed)
Patient Care Team: Robyn Sanders, MD as PCP - General (Internal Medicine)  DIAGNOSIS: Cancer of upper-inner quadrant of female breast   Primary site: Breast (Left)   Staging method: AJCC 7th Edition   Clinical: Stage IIA (T2, N0, cM0) signed by Stacy Wentworth, MD on 06/12/2011 11:38 AM   Pathologic: Stage IIA (T2, N0, cM0) signed by Martha K Gudena, MD on 03/28/2014 12:05 PM   Summary: Stage IIA (T2, N0, cM0)   SUMMARY OF ONCOLOGIC HISTORY:   Cancer of upper-inner quadrant of female breast   06/12/2011 Initial Diagnosis Cancer of upper-inner quadrant of female breast: Grade 3 IDC ER negative PR negative HER-2 negative, MRI 2.7 x 2.3 x 1 cm mass   06/24/2011 Surgery Left breast lumpectomy 2.2 cm IDC grade 3 with high-grade DCIS triple negative, for positive margins patient had reexcision: 2 lymph nodes negative ER/PR negative HER-2 negative ratio 0.97 Ki-67 75%   09/02/2011 Surgery Left mastectomy   10/01/2011 - 12/03/2011 Chemotherapy Adjuvant Taxotere and Cytoxan x4 cycles   07/05/2012 Surgery Breast reconstruction on the left breast with reduction of right breast    CHIEF COMPLIANT: Pelvic pain for the past 8 months  INTERVAL HISTORY: Martha Pham is a 63-year-old American lady with above-mentioned history of breast cancer that was treated with surgery with positive margins that led to a left mastectomy. Because of triple negative breast cancer she received adjuvant chemotherapy with Taxotere and Cytoxan for 4 cycles. She had breast reconstruction left breast and reduction of the breast on the right side. Ever since her reduction surgery she developed keloids that don't heal or feel normal. She denies any lumps or nodules in the breast. She had mammograms done in October 2014 which were normal.  Her major complaint is lower quadrant mid pelvic pain. It is not associated with any urinary disturbances on her bowel disturbances. It is chronic and constant and does not go away. She has previously seen  her GYN physician who ordered a scan. But apparently it was scan of the abdomen that was ordered and she refused to undergo the scan at that time. It had been rescheduled since.   REVIEW OF SYSTEMS:   Constitutional: Denies fevers, chills or abnormal weight loss Eyes: Denies blurriness of vision Ears, nose, mouth, throat, and face: Denies mucositis or sore throat Respiratory: Denies cough, dyspnea or wheezes Cardiovascular: Denies palpitation, chest discomfort or lower extremity swelling Gastrointestinal:  Denies nausea, heartburn or change in bowel habits Skin: Denies abnormal skin rashes Lymphatics: Denies new lymphadenopathy or easy bruising Neurological:Denies numbness, tingling or new weaknesses Behavioral/Psych: Mood is stable, no new changes  Breast:  denies any pain or lumps or nodules in either breasts All other systems were reviewed with the patient and are negative.  I have reviewed the past medical history, past surgical history, social history and family history with the patient and they are unchanged from previous note.  ALLERGIES:  is allergic to dust mite extract and other.  MEDICATIONS:  Current Outpatient Prescriptions  Medication Sig Dispense Refill  . atorvastatin (LIPITOR) 10 MG tablet Take 10 mg by mouth daily.        . calcium carbonate (OS-CAL) 600 MG TABS Take 600 mg by mouth 2 (two) times daily with a meal.        . Carboxymethylcellulose Sodium (REFRESH OP) Apply 1 drop to eye 2 (two) times daily as needed. For dry eyes      . diazepam (VALIUM) 2 MG tablet       .   fish oil-omega-3 fatty acids 1000 MG capsule Take 2 g by mouth daily.      . losartan-hydrochlorothiazide (HYZAAR) 50-12.5 MG per tablet 1 tablet daily.       . [DISCONTINUED] MICARDIS HCT 40-12.5 MG per tablet Take 1 tablet by mouth Daily.       Current Facility-Administered Medications  Medication Dose Route Frequency Provider Last Rate Last Dose  . Influenza vac split quadrivalent PF (FLUARIX)  injection 0.5 mL  0.5 mL Intramuscular Once Martha K Gudena, MD        PHYSICAL EXAMINATION: ECOG PERFORMANCE STATUS: 1 - Symptomatic but completely ambulatory  Filed Vitals:   03/28/14 1131  BP: 158/81  Pulse: 72  Temp: 98.1 F (36.7 C)  Resp: 18   Filed Weights   03/28/14 1131  Weight: 178 lb 8 oz (80.967 kg)    GENERAL:alert, no distress and comfortable SKIN: skin color, texture, turgor are normal, no rashes or significant lesions EYES: normal, Conjunctiva are pink and non-injected, sclera clear OROPHARYNX:no exudate, no erythema and lips, buccal mucosa, and tongue normal  NECK: supple, thyroid normal size, non-tender, without nodularity LYMPH:  no palpable lymphadenopathy in the cervical, axillary or inguinal LUNGS: clear to auscultation and percussion with normal breathing effort HEART: regular rate & rhythm and no murmurs and no lower extremity edema ABDOMEN:abdomen soft, non-tender and normal bowel sounds Musculoskeletal:no cyanosis of digits and no clubbing  NEURO: alert & oriented x 3 with fluent speech, no focal motor/sensory deficits BREAST: No palpable masses lungs or nodules in either right or left breasts. No palpable axillary supraclavicular or infraclavicular adenopathy no breast tenderness or nipple discharge. Right breast reduction with keloids left breast reconstruction with artificial nipple no palpable nodules in the axilla    LABORATORY DATA:  I have reviewed the data as listed   Chemistry      Component Value Date/Time   NA 145 03/28/2014 1121   NA 139 09/22/2012 1430   K 3.7 03/28/2014 1121   K 3.6 09/22/2012 1430   CL 105 09/22/2012 1430   CL 106 08/17/2012 1351   CO2 26 03/28/2014 1121   CO2 26 09/22/2012 1430   BUN 14.3 03/28/2014 1121   BUN 13 09/22/2012 1430   CREATININE 0.9 03/28/2014 1121   CREATININE 0.71 09/22/2012 1430      Component Value Date/Time   CALCIUM 9.3 03/28/2014 1121   CALCIUM 9.5 09/22/2012 1430   ALKPHOS 69 03/28/2014 1121   ALKPHOS 64  02/11/2012 1315   AST 17 03/28/2014 1121   AST 17 02/11/2012 1315   ALT 14 03/28/2014 1121   ALT 16 02/11/2012 1315   BILITOT 0.23 03/28/2014 1121   BILITOT 0.3 02/11/2012 1315       Lab Results  Component Value Date   WBC 6.2 03/28/2014   HGB 12.7 03/28/2014   HCT 39.1 03/28/2014   MCV 99.9 03/28/2014   PLT 187 03/28/2014   NEUTROABS 3.5 03/28/2014     RADIOGRAPHIC STUDIES: I have personally reviewed the radiology reports and agreed with their findings. No results found.   ASSESSMENT & PLAN:  Cancer of upper-inner quadrant of female breast Left breast cancer triple negative disease stage II A. patient's history summarized as above she had left mastectomy and reconstruction and reduction surgery in the right breast. I reviewed her mammograms done in October 2014 which were normal. Breast exam today did not reveal any lumps or nodules. Patient has profound keloids that occasionally cause some numbness sensation.  Discussed the   importance of physical exercise in decreasing the likelihood of breast cancer recurrence. Recommended 30 mins daily 6 days a week of either brisk walking or cycling or swimming. Encouraged patient to eat more fruits and vegetables and decrease red meat.    Pain in joint, pelvic region and thigh Pelvic pain for the last 8 months: This is not associated with any urinary disturbances are in bowel disturbances. She describes this as if she was in the premenstrual stage long time ago. She has not had any evidence of bleeding per vagina. I would like to obtain a CT abdomen and pelvis to assess her symptoms further. I would like to see her back after the scan to go over the results.   Orders Placed This Encounter  Procedures  . CT Abdomen Pelvis W Wo Contrast    This exam should ONLY be ordered for initial diagnosis or follow up of known pancreatic/liver/renal/bladder masses.    Standing Status: Future     Number of Occurrences:      Standing Expiration Date: 06/28/2015    Order  Specific Question:  Reason for Exam (SYMPTOM  OR DIAGNOSIS REQUIRED)    Answer:  Pelvic pain for 8 months with history of breast cancer    Order Specific Question:  Preferred imaging location?    Answer:  [facility]   The patient has a good understanding of the overall plan. she agrees with it. She will call with any problems that may develop before her next visit here.  I spent 25 minutes counseling the patient face to face. The total time spent in the appointment was 30 minutes and more than 50% was on counseling and review of test results    Pham, Martha K, MD 03/28/2014 12:10 PM   

## 2014-03-28 NOTE — Assessment & Plan Note (Signed)
Left breast cancer triple negative disease stage II A. patient's history summarized as above she had left mastectomy and reconstruction and reduction surgery in the right breast. I reviewed her mammograms done in October 2014 which were normal. Breast exam today did not reveal any lumps or nodules. Patient has profound keloids that occasionally cause some numbness sensation.  Discussed the importance of physical exercise in decreasing the likelihood of breast cancer recurrence. Recommended 30 mins daily 6 days a week of either brisk walking or cycling or swimming. Encouraged patient to eat more fruits and vegetables and decrease red meat.

## 2014-03-28 NOTE — Assessment & Plan Note (Signed)
Pelvic pain for the last 8 months: This is not associated with any urinary disturbances are in bowel disturbances. She describes this as if she was in the premenstrual stage long time ago. She has not had any evidence of bleeding per vagina. I would like to obtain a CT abdomen and pelvis to assess her symptoms further. I would like to see her back after the scan to go over the results.

## 2014-03-30 ENCOUNTER — Ambulatory Visit (HOSPITAL_COMMUNITY)
Admission: RE | Admit: 2014-03-30 | Discharge: 2014-03-30 | Disposition: A | Discharge status: Home / Self Care | Payer: Medicare Other | Source: Ambulatory Visit | Attending: Hematology and Oncology | Admitting: Hematology and Oncology

## 2014-03-30 ENCOUNTER — Other Ambulatory Visit: Payer: Self-pay | Admitting: Hematology and Oncology

## 2014-03-30 DIAGNOSIS — Z978 Presence of other specified devices: Secondary | ICD-10-CM | POA: Diagnosis not present

## 2014-03-30 DIAGNOSIS — M51379 Other intervertebral disc degeneration, lumbosacral region without mention of lumbar back pain or lower extremity pain: Secondary | ICD-10-CM | POA: Diagnosis not present

## 2014-03-30 DIAGNOSIS — M5137 Other intervertebral disc degeneration, lumbosacral region: Secondary | ICD-10-CM | POA: Insufficient documentation

## 2014-03-30 DIAGNOSIS — I998 Other disorder of circulatory system: Secondary | ICD-10-CM | POA: Insufficient documentation

## 2014-03-30 DIAGNOSIS — Z853 Personal history of malignant neoplasm of breast: Secondary | ICD-10-CM | POA: Diagnosis not present

## 2014-03-30 DIAGNOSIS — K5730 Diverticulosis of large intestine without perforation or abscess without bleeding: Secondary | ICD-10-CM | POA: Insufficient documentation

## 2014-03-30 DIAGNOSIS — K7689 Other specified diseases of liver: Secondary | ICD-10-CM | POA: Diagnosis not present

## 2014-03-30 DIAGNOSIS — Z9010 Acquired absence of unspecified breast and nipple: Secondary | ICD-10-CM | POA: Insufficient documentation

## 2014-03-30 DIAGNOSIS — M25559 Pain in unspecified hip: Secondary | ICD-10-CM

## 2014-03-30 MED ORDER — IOHEXOL 300 MG/ML  SOLN
100.0000 mL | Freq: Once | INTRAMUSCULAR | Status: AC | PRN
  Administered 2014-03-30: 100 mL via INTRAVENOUS

## 2014-04-04 ENCOUNTER — Telehealth: Payer: Self-pay | Admitting: Hematology and Oncology

## 2014-04-04 ENCOUNTER — Ambulatory Visit: Payer: Medicare Other

## 2014-04-04 ENCOUNTER — Other Ambulatory Visit: Payer: Self-pay | Admitting: Hematology and Oncology

## 2014-04-04 ENCOUNTER — Ambulatory Visit (HOSPITAL_BASED_OUTPATIENT_CLINIC_OR_DEPARTMENT_OTHER): Payer: Medicare Other | Admitting: Hematology and Oncology

## 2014-04-04 VITALS — BP 149/76 | HR 78 | Temp 98.4°F | Resp 20 | Ht 61.0 in | Wt 178.8 lb

## 2014-04-04 DIAGNOSIS — C50212 Malignant neoplasm of upper-inner quadrant of left female breast: Secondary | ICD-10-CM

## 2014-04-04 DIAGNOSIS — C50219 Malignant neoplasm of upper-inner quadrant of unspecified female breast: Secondary | ICD-10-CM | POA: Diagnosis not present

## 2014-04-04 DIAGNOSIS — Z171 Estrogen receptor negative status [ER-]: Secondary | ICD-10-CM | POA: Diagnosis not present

## 2014-04-04 DIAGNOSIS — Z1231 Encounter for screening mammogram for malignant neoplasm of breast: Secondary | ICD-10-CM

## 2014-04-04 DIAGNOSIS — Z23 Encounter for immunization: Secondary | ICD-10-CM

## 2014-04-04 DIAGNOSIS — M25559 Pain in unspecified hip: Secondary | ICD-10-CM | POA: Diagnosis not present

## 2014-04-04 MED ORDER — INFLUENZA VAC SPLIT QUAD 0.5 ML IM SUSY
0.5000 mL | PREFILLED_SYRINGE | Freq: Once | INTRAMUSCULAR | Status: AC
  Administered 2014-04-04: 0.5 mL via INTRAMUSCULAR
  Filled 2014-04-04: qty 0.5

## 2014-04-04 NOTE — Telephone Encounter (Signed)
gv pt appt schedule for march 2016 and mammo for 05/15/14.

## 2014-04-04 NOTE — Assessment & Plan Note (Signed)
CT of the abdomen and pelvis was reviewed with the patient. He did not show any evidence of metastatic disease on pelvic pathology. 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 Since then she has improved significantly in the intensity of the pelvic pain. I suspect the pelvic pain is related to pelvic musculature. I encouraged her to continue to exercise and stay active.

## 2014-04-04 NOTE — Progress Notes (Signed)
Patient Care Team: Glendale Chard, MD as PCP - General (Internal Medicine)  DIAGNOSIS: Cancer of upper-inner quadrant of female breast   Primary site: Breast (Left)   Staging method: AJCC 7th Edition   Clinical: Stage IIA (T2, N0, cM0) signed by Thea Silversmith, MD on 06/12/2011 11:38 AM   Pathologic: Stage IIA (T2, N0, cM0) signed by Rulon Eisenmenger, MD on 03/28/2014 12:05 PM   Summary: Stage IIA (T2, N0, cM0)   SUMMARY OF ONCOLOGIC HISTORY:   Cancer of upper-inner quadrant of female breast   06/12/2011 Initial Diagnosis Cancer of upper-inner quadrant of female breast: Grade 3 IDC ER negative PR negative HER-2 negative, MRI 2.7 x 2.3 x 1 cm mass   06/24/2011 Surgery Left breast lumpectomy 2.2 cm IDC grade 3 with high-grade DCIS triple negative, for positive margins patient had reexcision: 2 lymph nodes negative ER/PR negative HER-2 negative ratio 0.97 Ki-67 75%   09/02/2011 Surgery Left mastectomy   10/01/2011 - 12/03/2011 Chemotherapy Adjuvant Taxotere and Cytoxan x4 cycles   07/05/2012 Surgery Breast reconstruction on the left breast with reduction of right breast    CHIEF COMPLIANT: Improvement in pelvic pain and to followup the results of CT scan  INTERVAL HISTORY: [name] is a 63 year old African American lady with above-mentioned history of left breast cancer treated with lumpectomy initially followed by mastectomy. She received adjuvant chemotherapy with Taxotere Cytoxan for triple negative disease. She had breast reconstruction surgery on the left breast and reduction of the right breast. She had severe pelvic pain for which she came to was we obtained a CT of her abdomen and pelvis and she is here today to discuss those results. She reports that the pelvic pain has improved since last time by doing Zumba dance.   REVIEW OF SYSTEMS:   Constitutional: Denies fevers, chills or abnormal weight loss Eyes: Denies blurriness of vision Ears, nose, mouth, throat, and face: Denies mucositis or  sore throat Respiratory: Denies cough, dyspnea or wheezes Cardiovascular: Denies palpitation, chest discomfort or lower extremity swelling Gastrointestinal:  Denies nausea, heartburn or change in bowel habits Skin: Denies abnormal skin rashes Lymphatics: Denies new lymphadenopathy or easy bruising Neurological:Denies numbness, tingling or new weaknesses Behavioral/Psych: Mood is stable, no new changes  Breast:  denies any pain or lumps or nodules in either breasts All other systems were reviewed with the patient and are negative.  I have reviewed the past medical history, past surgical history, social history and family history with the patient and they are unchanged from previous note.  ALLERGIES:  is allergic to dust mite extract and other.  MEDICATIONS:  Current Outpatient Prescriptions  Medication Sig Dispense Refill  . atorvastatin (LIPITOR) 10 MG tablet Take 10 mg by mouth daily.        . calcium carbonate (OS-CAL) 600 MG TABS Take 600 mg by mouth 2 (two) times daily with a meal.        . Carboxymethylcellulose Sodium (REFRESH OP) Apply 1 drop to eye 2 (two) times daily as needed. For dry eyes      . diazepam (VALIUM) 2 MG tablet       . fish oil-omega-3 fatty acids 1000 MG capsule Take 2 g by mouth daily.      Marland Kitchen losartan-hydrochlorothiazide (HYZAAR) 50-12.5 MG per tablet 1 tablet daily.       . [DISCONTINUED] MICARDIS HCT 40-12.5 MG per tablet Take 1 tablet by mouth Daily.       No current facility-administered medications for this visit.  PHYSICAL EXAMINATION: ECOG PERFORMANCE STATUS: 1 - Symptomatic but completely ambulatory  Filed Vitals:   04/04/14 1009  BP: 149/76  Pulse: 78  Temp: 98.4 F (36.9 C)  Resp: 20   Filed Weights   04/04/14 1009  Weight: 178 lb 12.8 oz (81.103 kg)    GENERAL:alert, no distress and comfortable SKIN: skin color, texture, turgor are normal, no rashes or significant lesions EYES: normal, Conjunctiva are pink and non-injected, sclera  clear OROPHARYNX:no exudate, no erythema and lips, buccal mucosa, and tongue normal  NECK: supple, thyroid normal size, non-tender, without nodularity LYMPH:  no palpable lymphadenopathy in the cervical, axillary or inguinal LUNGS: clear to auscultation and percussion with normal breathing effort HEART: regular rate & rhythm and no murmurs and no lower extremity edema ABDOMEN:abdomen soft, non-tender and normal bowel sounds Musculoskeletal:no cyanosis of digits and no clubbing  NEURO: alert & oriented x 3 with fluent speech, no focal motor/sensory deficits  LABORATORY DATA:  I have reviewed the data as listed   Chemistry      Component Value Date/Time   NA 145 03/28/2014 1121   NA 139 09/22/2012 1430   K 3.7 03/28/2014 1121   K 3.6 09/22/2012 1430   CL 105 09/22/2012 1430   CL 106 08/17/2012 1351   CO2 26 03/28/2014 1121   CO2 26 09/22/2012 1430   BUN 14.3 03/28/2014 1121   BUN 13 09/22/2012 1430   CREATININE 0.9 03/28/2014 1121   CREATININE 0.71 09/22/2012 1430      Component Value Date/Time   CALCIUM 9.3 03/28/2014 1121   CALCIUM 9.5 09/22/2012 1430   ALKPHOS 69 03/28/2014 1121   ALKPHOS 64 02/11/2012 1315   AST 17 03/28/2014 1121   AST 17 02/11/2012 1315   ALT 14 03/28/2014 1121   ALT 16 02/11/2012 1315   BILITOT 0.23 03/28/2014 1121   BILITOT 0.3 02/11/2012 1315       Lab Results  Component Value Date   WBC 6.2 03/28/2014   HGB 12.7 03/28/2014   HCT 39.1 03/28/2014   MCV 99.9 03/28/2014   PLT 187 03/28/2014   NEUTROABS 3.5 03/28/2014     RADIOGRAPHIC STUDIES: I have personally reviewed the radiology reports and agreed with their findings. No results found.   ASSESSMENT & PLAN:  Cancer of upper-inner quadrant of female breast Left breast cancer triple negative disease stage II A.: Patient will be scheduled for mammograms in October 2015. Previous breast exam done a month ago did not reveal any lumps or nodules. She will call us to get the results of the mammogram.  Return to clinic in 6 months for  followup. I reviewed her blood work done recently. I do not recommend every 6 months blood work given that they were completely normal. Our plan is to do annual blood work only.  Pain in joint, pelvic region and thigh CT of the abdomen and pelvis was reviewed with the patient. He did not show any evidence of metastatic disease on pelvic pathology. 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 Since then she has improved significantly in the intensity of the pelvic pain. I suspect the pelvic pain is related to pelvic musculature. I encouraged her to continue to exercise and stay active.   Orders Placed This Encounter  Procedures  . MM Digital Diagnostic Unilat R    Standing Status: Future     Number of Occurrences:      Standing Expiration Date: 04/04/2015    Order Specific Question:  Reason for Exam (SYMPTOM  OR DIAGNOSIS REQUIRED)    Answer:  H/O left breast cancer annual follow up on right breast    Order Specific Question:  Preferred imaging location?    Answer:  [facility]   The patient has a good understanding of the overall plan. she agrees with it. She will call with any problems that may develop before her next visit here.  I spent 25 minutes counseling the patient face to face. The total time spent in the appointment was 30 minutes and more than 50% was on counseling and review of test results    Rulon Eisenmenger, MD 04/04/2014 10:47 AM

## 2014-04-04 NOTE — Assessment & Plan Note (Signed)
Left breast cancer triple negative disease stage II A.: Patient will be scheduled for mammograms in October 2015. Previous breast exam done a month ago did not reveal any lumps or nodules. She will call us to get the results of the mammogram.  Return to clinic in 6 months for followup. I reviewed her blood work done recently. I do not recommend every 6 months blood work given that they were completely normal. Our plan is to do annual blood work only.

## 2014-04-04 NOTE — Progress Notes (Signed)
Inj given by MD nurse

## 2014-05-11 DIAGNOSIS — E669 Obesity, unspecified: Secondary | ICD-10-CM | POA: Diagnosis not present

## 2014-05-11 DIAGNOSIS — Z6835 Body mass index (BMI) 35.0-35.9, adult: Secondary | ICD-10-CM | POA: Diagnosis not present

## 2014-05-11 DIAGNOSIS — I129 Hypertensive chronic kidney disease with stage 1 through stage 4 chronic kidney disease, or unspecified chronic kidney disease: Secondary | ICD-10-CM | POA: Diagnosis not present

## 2014-05-11 DIAGNOSIS — N182 Chronic kidney disease, stage 2 (mild): Secondary | ICD-10-CM | POA: Diagnosis not present

## 2014-05-17 ENCOUNTER — Ambulatory Visit
Admission: RE | Admit: 2014-05-17 | Discharge: 2014-05-17 | Disposition: A | Discharge status: Home / Self Care | Payer: Medicare Other | Source: Ambulatory Visit | Attending: Hematology and Oncology | Admitting: Hematology and Oncology

## 2014-05-17 ENCOUNTER — Other Ambulatory Visit: Payer: Self-pay | Admitting: Hematology and Oncology

## 2014-05-17 DIAGNOSIS — Z1231 Encounter for screening mammogram for malignant neoplasm of breast: Secondary | ICD-10-CM | POA: Diagnosis not present

## 2014-07-04 DIAGNOSIS — Z853 Personal history of malignant neoplasm of breast: Secondary | ICD-10-CM | POA: Diagnosis not present

## 2014-09-06 ENCOUNTER — Encounter (INDEPENDENT_AMBULATORY_CARE_PROVIDER_SITE_OTHER): Payer: Self-pay | Admitting: General Surgery

## 2014-09-06 DIAGNOSIS — Z853 Personal history of malignant neoplasm of breast: Secondary | ICD-10-CM | POA: Diagnosis not present

## 2014-09-06 NOTE — Progress Notes (Signed)
Patient ID: Martha Pham, female   DOB: 12-May-1951, 64 y.o.   MRN: [id number]  Martha Pham 09/06/2014 10:34 AM Location: [facility] Patient #: [id number] DOB: 01/18/1951 Widowed / Language: Cleophus Molt / Race: Black or African American Female History of Present Illness Martha Hollingshead MD; 09/06/2014 11:06 AM) Patient words: f/u.  The patient is a 64 year old female    Note:Procedures: Left breast lumpectomy and left axilla sentinel lymph node biopsy Martha 2012. Reexcision of left breast lumpectomy site January 2013. Left simple mastectomy February 2013.  Pathology: T2N0 triple negative  Hx: She is here for long-term followup of her left breast cancer status post the above procedures. She does not feel any nodules in her native skin on the left. No new breast masses on the right. She denies lymphadenopathy.  PE: General-Well-developed well-nourished in no acute distress.  Breasts-Right breast demonstrates hypertrophic scars consistent with breast reduction and previous PAC site. No dominant masses.  Left chest wall skin is without nodules. Implant in place.  Lymph nodes-No palpable cervical, supraclavicular, or axillary adenopathy.    Other Problems Marjean Donna, CMA; 09/06/2014 10:34 AM) Anxiety Disorder Arthritis Back Pain Breast Cancer Depression High blood pressure Hypercholesterolemia Sleep Apnea  Past Surgical History Marjean Donna, CMA; 09/06/2014 10:34 AM) Breast Biopsy Left. Breast Mass; Local Excision Left. Breast Reconstruction Left. Colon Polyp Removal - Colonoscopy Mammoplasty; Reduction Right. Mastectomy Left. Oral Surgery  Diagnostic Studies History Marjean Donna, CMA; 09/06/2014 10:34 AM) Colonoscopy 1-5 years ago Mammogram within last year Pap Smear 1-5 years ago  Allergies Marjean Donna, CMA; 09/06/2014 10:36 AM) No Known Drug Allergies 09/06/2014  Medication History (Sonya Bynum, CMA; 09/06/2014 10:36  AM) Losartan Potassium-HCTZ (100-12.5MG  Tablet, Oral) Active. Atorvastatin Calcium (20MG  Tablet, Oral) Active.  Social History Marjean Donna, CMA; 09/06/2014 10:34 AM) Alcohol use Occasional alcohol use. Caffeine use Coffee. No drug use Tobacco use Never smoker.  Family History Marjean Donna, Powderly; 09/06/2014 10:34 AM) Alcohol Abuse Brother, Father. Arthritis Mother, Sister. Breast Cancer Sister. Cerebrovascular Accident Father. Colon Polyps Brother, Sister. Depression Son. Diabetes Mellitus Sister. Heart Disease Sister. Heart disease in female family member before age 93 Heart disease in female family member before age 73 Hypertension Daughter, Mother, Sister, Son. Migraine Headache Daughter. Respiratory Condition Brother.  Pregnancy / Birth History Marjean Donna, Fidelis; 09/06/2014 10:34 AM) Age at menarche 42 years. Age of menopause 42-50 Gravida 3 Irregular periods Maternal age 78-20 Para 3     Review of Systems ([place]; 09/06/2014 10:34 AM) General Present- Night Sweats. Not Present- Appetite Loss, Chills, Fatigue, Fever, Weight Gain and Weight Loss. Skin Present- Change in Wart/Mole and Dryness. Not Present- Hives, Jaundice, New Lesions, Non-Healing Wounds, Rash and Ulcer. HEENT Present- Sinus Pain and Wears glasses/contact lenses. Not Present- Earache, Hearing Loss, Hoarseness, Nose Bleed, Oral Ulcers, Ringing in the Ears, Seasonal Allergies, Sore Throat, Visual Disturbances and Yellow Eyes. Respiratory Present- Snoring. Not Present- Bloody sputum, Chronic Cough, Difficulty Breathing and Wheezing. Breast Present- Breast Pain. Not Present- Breast Mass, Nipple Discharge and Skin Changes. Cardiovascular Present- Leg Cramps, Palpitations and Rapid Heart Rate. Not Present- Chest Pain, Difficulty Breathing Lying Down, Shortness of Breath and Swelling of Extremities. Gastrointestinal Not Present- Abdominal Pain, Bloating, Bloody Stool, Change in Bowel  Habits, Chronic diarrhea, Constipation, Difficulty Swallowing, Excessive gas, Gets full quickly at meals, Hemorrhoids, Indigestion, Nausea, Rectal Pain and Vomiting. Female Genitourinary Present- Pelvic Pain and Urgency. Not Present- Frequency, Nocturia and Painful Urination. Musculoskeletal Present- Back Pain, Joint Pain, Joint Stiffness,  Muscle Pain and Muscle Weakness. Not Present- Swelling of Extremities. Neurological Present- Decreased Memory, Headaches, Numbness, Tingling, Trouble walking and Weakness. Not Present- Fainting, Seizures and Tremor. Psychiatric Present- Anxiety, Depression and Fearful. Not Present- Bipolar, Change in Sleep Pattern and Frequent crying. Endocrine Present- Hot flashes. Not Present- Cold Intolerance, Excessive Hunger, Hair Changes, Heat Intolerance and New Diabetes.  Vitals (Sonya Bynum CMA; 09/06/2014 10:35 AM) 09/06/2014 10:34 AM Weight: 177 lb Height: 60in Body Surface Area: 1.84 m Body Mass Index: 34.57 kg/m Temp.: 97.61F(Temporal)  Pulse: 77 (Regular)  BP: 126/80 (Sitting, Left Arm, Standard)     Assessment & Plan Martha Hollingshead MD; 09/06/2014 11:07 AM)  HISTORY OF LEFT BREAST CANCER (V10.3  Z85.3) Impression: Assessment: Left breast cancer-no clinical evidence of recurrence. Mammogram from the October 2015 was BI-RADS 1.  Plan: Return visit with me in 6 months.  Jackolyn Confer, MD

## 2014-09-13 DIAGNOSIS — Z6836 Body mass index (BMI) 36.0-36.9, adult: Secondary | ICD-10-CM | POA: Diagnosis not present

## 2014-09-13 DIAGNOSIS — R87611 Atypical squamous cells cannot exclude high grade squamous intraepithelial lesion on cytologic smear of cervix (ASC-H): Secondary | ICD-10-CM | POA: Diagnosis not present

## 2014-09-13 DIAGNOSIS — R109 Unspecified abdominal pain: Secondary | ICD-10-CM | POA: Diagnosis not present

## 2014-09-13 DIAGNOSIS — R011 Cardiac murmur, unspecified: Secondary | ICD-10-CM | POA: Diagnosis not present

## 2014-09-13 DIAGNOSIS — C50912 Malignant neoplasm of unspecified site of left female breast: Secondary | ICD-10-CM | POA: Diagnosis not present

## 2014-09-13 DIAGNOSIS — Z124 Encounter for screening for malignant neoplasm of cervix: Secondary | ICD-10-CM | POA: Diagnosis not present

## 2014-09-14 DIAGNOSIS — R7309 Other abnormal glucose: Secondary | ICD-10-CM | POA: Diagnosis not present

## 2014-09-14 DIAGNOSIS — I129 Hypertensive chronic kidney disease with stage 1 through stage 4 chronic kidney disease, or unspecified chronic kidney disease: Secondary | ICD-10-CM | POA: Diagnosis not present

## 2014-09-14 DIAGNOSIS — R002 Palpitations: Secondary | ICD-10-CM | POA: Diagnosis not present

## 2014-09-14 DIAGNOSIS — N181 Chronic kidney disease, stage 1: Secondary | ICD-10-CM | POA: Diagnosis not present

## 2014-10-03 ENCOUNTER — Telehealth: Payer: Self-pay | Admitting: Hematology and Oncology

## 2014-10-03 ENCOUNTER — Ambulatory Visit (HOSPITAL_BASED_OUTPATIENT_CLINIC_OR_DEPARTMENT_OTHER): Payer: Medicare Other | Admitting: Hematology and Oncology

## 2014-10-03 VITALS — BP 151/73 | HR 81 | Temp 98.3°F | Resp 17 | Ht 61.0 in | Wt 180.1 lb

## 2014-10-03 DIAGNOSIS — M7711 Lateral epicondylitis, right elbow: Secondary | ICD-10-CM

## 2014-10-03 DIAGNOSIS — C50212 Malignant neoplasm of upper-inner quadrant of left female breast: Secondary | ICD-10-CM

## 2014-10-03 DIAGNOSIS — Z9221 Personal history of antineoplastic chemotherapy: Secondary | ICD-10-CM

## 2014-10-03 DIAGNOSIS — Z853 Personal history of malignant neoplasm of breast: Secondary | ICD-10-CM

## 2014-10-03 NOTE — Assessment & Plan Note (Signed)
Left breast cancer treated with lumpectomy 06/24/2011, later underwent mastectomy 09/02/2011, 2.2 cm grade 3 with high-grade DCIS, triple negative, 2 lymph nodes negative, Ki-67 75%, adjuvant chemotherapy with Taxotere and Cytoxan completed 12/03/2011 followed by breast reconstruction 07/05/2012  Breast cancer surveillance: 1. Breast exam 10/03/2014 is normal 2. Mammogram 05/18/2014 is normal  Return to clinic in 6 months for follow-up exams.

## 2014-10-03 NOTE — Progress Notes (Signed)
Patient Care Team: Glendale Chard, MD as PCP - General (Internal Medicine)  DIAGNOSIS: Breast cancer of upper-inner quadrant of left female breast   Staging form: Breast, AJCC 7th Edition     Clinical: Stage IIA (T2, N0, cM0) - Signed by Thea Silversmith, MD on 06/12/2011     Pathologic: Stage IIA (T2, N0, cM0) - Signed by Rulon Eisenmenger, MD on 03/28/2014   SUMMARY OF ONCOLOGIC HISTORY:   Breast cancer of upper-inner quadrant of left female breast   06/12/2011 Initial Diagnosis Cancer of upper-inner quadrant of female breast: Grade 3 IDC ER negative PR negative HER-2 negative, MRI 2.7 x 2.3 x 1 cm mass   06/24/2011 Surgery Left breast lumpectomy 2.2 cm IDC grade 3 with high-grade DCIS triple negative, for positive margins patient had reexcision: 2 lymph nodes negative ER/PR negative HER-2 negative ratio 0.97 Ki-67 75%   09/02/2011 Surgery Left mastectomy   10/01/2011 - 12/03/2011 Chemotherapy Adjuvant Taxotere and Cytoxan x4 cycles   07/05/2012 Surgery Breast reconstruction on the left breast with reduction of right breast    CHIEF COMPLIANT: Complains of bone pain especially the right elbow, recent Pap test showing abnormal cells  INTERVAL HISTORY: Martha Pham is a 64 year old lady with triple negative breast cancer who is currently on surveillance. She is doing fairly well except for recent diagnosis of abnormal cells on Pap smear and she is very worried about it. She is also complaining of right elbow pain after recent bowling with her friends. It does appear to be like tennis elbow symptoms. But she also has chronic neck and back pain and she is worried that it is her breast cancer causing these symptoms.  REVIEW OF SYSTEMS:   Constitutional: Denies fevers, chills or abnormal weight loss Eyes: Denies blurriness of vision Ears, nose, mouth, throat, and face: Denies mucositis or sore throat Respiratory: Denies cough, dyspnea or wheezes Cardiovascular: Denies palpitation, chest discomfort or  lower extremity swelling Gastrointestinal:  Denies nausea, heartburn or change in bowel habits Skin: Denies abnormal skin rashes Lymphatics: Denies new lymphadenopathy or easy bruising Neurological:Denies numbness, tingling or new weaknesses Behavioral/Psych: Mood is stable, no new changes  Breast:  denies any pain or lumps or nodules in either breasts All other systems were reviewed with the patient and are negative.  I have reviewed the past medical history, past surgical history, social history and family history with the patient and they are unchanged from previous note.  ALLERGIES:  is allergic to dust mite extract and other.  MEDICATIONS:  Current Outpatient Prescriptions  Medication Sig Dispense Refill  . atorvastatin (LIPITOR) 10 MG tablet Take 10 mg by mouth daily.      . calcium carbonate (OS-CAL) 600 MG TABS Take 600 mg by mouth 2 (two) times daily with a meal.      . Carboxymethylcellulose Sodium (REFRESH OP) Apply 1 drop to eye 2 (two) times daily as needed. For dry eyes    . diazepam (VALIUM) 2 MG tablet     . fish oil-omega-3 fatty acids 1000 MG capsule Take 2 g by mouth daily.    Marland Kitchen losartan-hydrochlorothiazide (HYZAAR) 50-12.5 MG per tablet 1 tablet daily.     . [DISCONTINUED] MICARDIS HCT 40-12.5 MG per tablet Take 1 tablet by mouth Daily.     No current facility-administered medications for this visit.    PHYSICAL EXAMINATION: ECOG PERFORMANCE STATUS: 1 - Symptomatic but completely ambulatory  Filed Vitals:   10/03/14 0949  BP: 151/73  Pulse: 81  Temp:  98.3 F (36.8 C)  Resp: 17   Filed Weights   10/03/14 0949  Weight: 180 lb 1.6 oz (81.693 kg)    GENERAL:alert, no distress and comfortable SKIN: skin color, texture, turgor are normal, no rashes or significant lesions EYES: normal, Conjunctiva are pink and non-injected, sclera clear OROPHARYNX:no exudate, no erythema and lips, buccal mucosa, and tongue normal  NECK: supple, thyroid normal size,  non-tender, without nodularity LYMPH:  no palpable lymphadenopathy in the cervical, axillary or inguinal LUNGS: clear to auscultation and percussion with normal breathing effort HEART: regular rate & rhythm and no murmurs and no lower extremity edema ABDOMEN:abdomen soft, non-tender and normal bowel sounds Musculoskeletal:no cyanosis of digits and no clubbing  NEURO: alert & oriented x 3 with fluent speech, no focal motor/sensory deficits BREAST: No palpable masses or nodules in either right or left breasts. No palpable axillary supraclavicular or infraclavicular adenopathy no breast tenderness or nipple discharge. (exam performed in the presence of a chaperone)  LABORATORY DATA:  I have reviewed the data as listed   Chemistry      Component Value Date/Time   NA 145 03/28/2014 1121   NA 139 09/22/2012 1430   K 3.7 03/28/2014 1121   K 3.6 09/22/2012 1430   CL 105 09/22/2012 1430   CL 106 08/17/2012 1351   CO2 26 03/28/2014 1121   CO2 26 09/22/2012 1430   BUN 14.3 03/28/2014 1121   BUN 13 09/22/2012 1430   CREATININE 0.9 03/28/2014 1121   CREATININE 0.71 09/22/2012 1430      Component Value Date/Time   CALCIUM 9.3 03/28/2014 1121   CALCIUM 9.5 09/22/2012 1430   ALKPHOS 69 03/28/2014 1121   ALKPHOS 64 02/11/2012 1315   AST 17 03/28/2014 1121   AST 17 02/11/2012 1315   ALT 14 03/28/2014 1121   ALT 16 02/11/2012 1315   BILITOT 0.23 03/28/2014 1121   BILITOT 0.3 02/11/2012 1315       Lab Results  Component Value Date   WBC 6.2 03/28/2014   HGB 12.7 03/28/2014   HCT 39.1 03/28/2014   MCV 99.9 03/28/2014   PLT 187 03/28/2014   NEUTROABS 3.5 03/28/2014   ASSESSMENT & PLAN:  Breast cancer of upper-inner quadrant of left female breast Left breast cancer treated with lumpectomy 06/24/2011, later underwent mastectomy 09/02/2011, 2.2 cm grade 3 with high-grade DCIS, triple negative, 2 lymph nodes negative, Ki-67 75%, adjuvant chemotherapy with Taxotere and Cytoxan completed  12/03/2011 followed by breast reconstruction 07/05/2012  Breast cancer surveillance: 1. Breast exam 10/03/2014 is normal 2. Mammogram 05/18/2014 is normal  Bone pain: We will get a bone scan to further evaluate. I will call her with the results of this test. Tennis elbow: Encouraged her to use supportive therapy and not to go bowling until the pain was better Return to clinic in 6 months for follow-up exams.  No orders of the defined types were placed in this encounter.   The patient has a good understanding of the overall plan. she agrees with it. She will call with any problems that may develop before her next visit here.   Rulon Eisenmenger, MD

## 2014-10-03 NOTE — Telephone Encounter (Signed)
appts made and avs printed for pt  Martha Pham °

## 2014-10-09 ENCOUNTER — Encounter (HOSPITAL_COMMUNITY): Payer: Medicare Other

## 2014-10-09 ENCOUNTER — Ambulatory Visit (HOSPITAL_COMMUNITY)
Admission: RE | Admit: 2014-10-09 | Discharge: 2014-10-09 | Disposition: A | Discharge status: Home / Self Care | Payer: Medicare Other | Source: Ambulatory Visit | Attending: Hematology and Oncology | Admitting: Hematology and Oncology

## 2014-10-09 DIAGNOSIS — Z853 Personal history of malignant neoplasm of breast: Secondary | ICD-10-CM | POA: Diagnosis not present

## 2014-10-09 DIAGNOSIS — M898X9 Other specified disorders of bone, unspecified site: Secondary | ICD-10-CM | POA: Insufficient documentation

## 2014-10-09 DIAGNOSIS — M79605 Pain in left leg: Secondary | ICD-10-CM | POA: Insufficient documentation

## 2014-10-09 DIAGNOSIS — M79601 Pain in right arm: Secondary | ICD-10-CM | POA: Insufficient documentation

## 2014-10-09 DIAGNOSIS — C50212 Malignant neoplasm of upper-inner quadrant of left female breast: Secondary | ICD-10-CM

## 2014-10-09 DIAGNOSIS — R102 Pelvic and perineal pain: Secondary | ICD-10-CM | POA: Diagnosis not present

## 2014-10-09 MED ORDER — TECHNETIUM TC 99M MEDRONATE IV KIT
25.0000 | PACK | Freq: Once | INTRAVENOUS | Status: AC | PRN
  Administered 2014-10-09: 25 via INTRAVENOUS

## 2014-10-17 DIAGNOSIS — Z853 Personal history of malignant neoplasm of breast: Secondary | ICD-10-CM | POA: Diagnosis not present

## 2014-10-17 DIAGNOSIS — N650 Deformity of reconstructed breast: Secondary | ICD-10-CM | POA: Diagnosis not present

## 2014-10-30 ENCOUNTER — Telehealth: Payer: Self-pay | Admitting: Neurology

## 2014-10-30 NOTE — Telephone Encounter (Signed)
Tori with [facility] stated information requested she does not have.  FYI

## 2014-10-31 ENCOUNTER — Encounter: Payer: Self-pay | Admitting: Neurology

## 2014-10-31 ENCOUNTER — Ambulatory Visit (INDEPENDENT_AMBULATORY_CARE_PROVIDER_SITE_OTHER): Payer: Medicare Other | Admitting: Neurology

## 2014-10-31 VITALS — BP 128/64 | HR 72 | Resp 16 | Ht 61.0 in | Wt 176.0 lb

## 2014-10-31 DIAGNOSIS — G4733 Obstructive sleep apnea (adult) (pediatric): Secondary | ICD-10-CM | POA: Diagnosis not present

## 2014-10-31 DIAGNOSIS — G2581 Restless legs syndrome: Secondary | ICD-10-CM

## 2014-10-31 DIAGNOSIS — R51 Headache: Secondary | ICD-10-CM

## 2014-10-31 DIAGNOSIS — G4719 Other hypersomnia: Secondary | ICD-10-CM

## 2014-10-31 DIAGNOSIS — R351 Nocturia: Secondary | ICD-10-CM

## 2014-10-31 DIAGNOSIS — R519 Headache, unspecified: Secondary | ICD-10-CM

## 2014-10-31 DIAGNOSIS — E669 Obesity, unspecified: Secondary | ICD-10-CM | POA: Diagnosis not present

## 2014-10-31 NOTE — Patient Instructions (Signed)
You have a history of obstructive sleep apnea or OSA, and I think we should proceed with another sleep study to determine whether you do or do not still have OSA and how severe it is. If you have more than mild OSA, I want you to consider treatment with CPAP. Please remember, the risks and ramifications of moderate to severe obstructive sleep apnea or OSA are: Cardiovascular disease, including congestive heart failure, stroke, difficult to control hypertension, arrhythmias, and even type 2 diabetes has been linked to untreated OSA. Sleep apnea causes disruption of sleep and sleep deprivation in most cases, which, in turn, can cause recurrent headaches, problems with memory, mood, concentration, focus, and vigilance. Most people with untreated sleep apnea report excessive daytime sleepiness, which can affect their ability to drive. Please do not drive if you feel sleepy.  I will see you back after your sleep study to go over the test results and where to go from there. We will call you after your sleep study and to set up an appointment at the time.

## 2014-10-31 NOTE — Progress Notes (Signed)
Subjective:    Patient ID: Martha Pham is a 64 y.o. female.  HPI     Star Age, MD, PhD [facility] [address] [place], [place] [address]  Dear Dr. Baird Cancer,   I saw your patient, Martha Pham, upon your kind request in my neurologic clinic today for initial consultation of her sleep disorder, in particular reevaluation of her obstructive sleep apnea. The patient is unaccompanied today. As you know, [name] is a 64 year old right-handed woman with an underlying medical history of hyperlipidemia, hypertension, vitamin D deficiency, chronic kidney disease, breast cancer in 2012, s/p lumpectomy x 2 and then L mastectomy on 09/01/11, s/p chemo, low back pain and obesity, who was diagnosed with obstructive sleep apnea several years ago. She was placed on CPAP therapy. I reviewed her baseline sleep study results performed at the [facility] on 08/05/2009: Sleep efficiency was 91.4%. REM latency was 209 minutes. Arousal index was 19.1 per hour. Her total AHI was 9.3 per hour. Her REM AHI was 36.7 per hour. Average oxygen saturation was 96%, nadir was 82%. CPAP titration was recommended.   I reviewed her blood test results from 09/14/2014 through your office: TSH was unremarkable, hemoglobin A1c was 5.6, total cholesterol 177, HDL 58, LDL 97, CMP unremarkable, CBC with differential unremarkable very she had an EKG on 09/14/2014 which upon my review looked unremarkable.  She brought her CPAP machine but reports that she has not used it in the last few months because of air leaking. There is no SD card of the machine.   She has lack of energy and occasional AM HAs, occasional night time palpitations and nocturia x 2 on average. In February, her GYN noted a new heart murmur, but her during her appointment on 09/14/14 showed no murmur and EKG was fine.   Her bedtime is around 11:30 PM and typically she falls asleep quickly.  Sometimes she wakes up in the middle of the night other than going to the bathroom. She has restless leg symptoms almost on a nightly basis but is not sure if she twitches her kicks in her sleep. She has no parasomnias. Her rise time is around 8 and she wakes up marginally rested on most days. She sleeps alone. She lives alone. She has one daughter who lives in the area but will be moving to [place] soon. The patient is retired and worked for [employer]. She is a nonsmoker and drinks alcohol rarely. She drinks coffee maybe once a day.  Her Past Medical History Is Significant For: Past Medical History  Diagnosis Date  . Hypertension   . Hyperlipemia   . Cancer     lt. breast ca-snbx  . Arthritis   . Contact lens/glasses fitting     wears contacts or glasses  . Wears dentures     upper  . Sleep apnea     SLEEP STUDY DX 10,CPAP BUT DON'TUSE  . Apnea, sleep     does wear cpap  . Heart murmur   . Chronic kidney disease   . Vitamin D deficiency   . Malaise and fatigue   . Hypercholesteremia   . Lower back pain     Her Past Surgical History Is Significant For: Past Surgical History  Procedure Laterality Date  . Tonsillectomy    . Mouth surgery      TOOTH IMPLANT BOTTOM X2 LFT  . Portacath placement  06/24/2011    Procedure: INSERTION PORT-A-CATH;  Surgeon: Judieth Keens, DO;  Location: [place];  Service: General;  Laterality: Right;  Right mediport placement  . Simple mastectomy  09/01/2011    left  . Breast lumpectomy w/ needle localization  06/24/2011    left  . Breast surgery  08/07/2011    re-exc. left breast margins  . Reconstruction breast immediate / delayed w/ tissue expander  10/13    left  . Breast implant exchange Left 09/23/2012    Procedure: REMOVAL OF LEFT EXPANDER/PLACEMENT OF IMPLANT WITH REDUCTION OF RIGHT BREAST;  Surgeon: Theodoro Kos, DO;  Location: [place];  Service: Plastics;  Laterality: Left;  . Breast reduction surgery  Right 09/23/2012    Procedure: MAMMARY REDUCTION  (BREAST);  Surgeon: Theodoro Kos, DO;  Location: [place];  Service: Plastics;  Laterality: Right;  . Port-a-cath removal Right 09/23/2012    Procedure: REMOVAL PORT-A-CATH;  Surgeon: Theodoro Kos, DO;  Location: [place];  Service: Plastics;  Laterality: Right;  . Liposuction Bilateral 09/23/2012    Procedure: LIPOSUCTION;  Surgeon: Theodoro Kos, DO;  Location: [place];  Service: Plastics;  Laterality: Bilateral;  . Cyst removal neck  08/2013    Dr. Migdalia Dk    Her Family History Is Significant For: Family History  Problem Relation Age of Onset  . Breast cancer Sister 15  . Brain cancer Father   . Heart attack Mother     Her Social History Is Significant For: History   Social History  . Marital Status: Widowed    Spouse Name: N/A  . Number of Children: 2  . Years of Education: 14   Occupational History  . Retired    Social History Main Topics  . Smoking status: Never Smoker   . Smokeless tobacco: Never Used  . Alcohol Use: 1.2 oz/week    2 Glasses of wine per week     Comment: 8 oz occasionally per week  . Drug Use: No  . Sexual Activity:    Partners: Male    Birth Control/ Protection: Post-menopausal   Other Topics Concern  . None   Social History Narrative   Daughter [place], [place]    Her Allergies Are:  Allergies  Allergen Reactions  . Dust Mite Extract Itching  . Other     grass  :   Her Current Medications Are:  Outpatient Encounter Prescriptions as of 10/31/2014  Medication Sig  . atorvastatin (LIPITOR) 10 MG tablet Take 10 mg by mouth daily.    . calcium carbonate (OS-CAL) 600 MG TABS Take 600 mg by mouth 2 (two) times daily with a meal.    . Carboxymethylcellulose Sodium (REFRESH OP) Apply 1 drop to eye 2 (two) times daily as needed. For dry eyes  . diazepam (VALIUM) 2 MG tablet   . fish oil-omega-3 fatty acids 1000 MG capsule Take 2 g by mouth daily.   Marland Kitchen losartan-hydrochlorothiazide (HYZAAR) 50-12.5 MG per tablet 1 tablet daily.   :  Review of Systems:  Out of a complete 14 point review of systems, all are reviewed and negative with the exception of these symptoms as listed below:   Review of Systems  Constitutional: Positive for fatigue.  HENT: Positive for rhinorrhea.   Eyes:       Blurred vision   Respiratory:       Snoring   Cardiovascular:       Heart murmur  Endocrine:       Feeling hot, Feeling  cold, Flushing   Musculoskeletal:       Joint Pain, Aching Muscles  Allergic/Immunologic: Positive for environmental allergies.  Neurological: Positive for weakness and numbness.       Memory loss, Confusion, Slurred speech, Sleepiness, Snoring, Restless legs  Hematological: Bruises/bleeds easily.  Psychiatric/Behavioral:       Depression, Anxiety, Decreased energy, Racing thoughts    Objective:  Neurologic Exam  Physical Exam Physical Examination:   Filed Vitals:   10/31/14 1016  BP: 128/64  Pulse: 72  Resp: 16   General Examination: The patient is a very pleasant 64 y.o. female in no acute distress. She appears well-developed and well-nourished and very well groomed.   HEENT: Normocephalic, atraumatic, pupils are equal, round and reactive to light and accommodation. Funduscopic exam is normal with sharp disc margins noted. Extraocular tracking is good without limitation to gaze excursion or nystagmus noted. Normal smooth pursuit is noted. Hearing is grossly intact. Tympanic membranes are clear bilaterally. Face is symmetric with normal facial animation and normal facial sensation. Speech is clear with no dysarthria noted. There is no hypophonia. There is no lip, neck/head, jaw or voice tremor. Neck is supple with full range of passive and active motion. There are no carotid bruits on auscultation. Oropharynx exam reveals: mild mouth dryness, good dental hygiene and mild airway crowding, due to narrow airway entry,  elongated tongue and redundant soft palate. Tonsils are absent. Mallampati is class II. Tongue protrudes centrally and palate elevates symmetrically. Neck size is 14 inches. She has a very mild overbite.  Chest: Clear to auscultation without wheezing, rhonchi or crackles noted.  Heart: S1+S2+0, regular and normal without murmurs, rubs or gallops noted.   Abdomen: Soft, non-tender and non-distended with normal bowel sounds appreciated on auscultation.  Extremities: There is no pitting edema in the distal lower extremities bilaterally. Pedal pulses are intact.  Skin: Warm and dry without trophic changes noted. There are no varicose veins.  Musculoskeletal: exam reveals no obvious joint deformities, tenderness or joint swelling or erythema.   Neurologically:  Mental status: The patient is awake, alert and oriented in all 4 spheres. Her immediate and remote memory, attention, language skills and fund of knowledge are appropriate. There is no evidence of aphasia, agnosia, apraxia or anomia. Speech is clear with normal prosody and enunciation. Thought process is linear. Mood is normal and affect is normal.  Cranial nerves II - XII are as described above under HEENT exam. In addition: shoulder shrug is normal with equal shoulder height noted. Motor exam: Normal bulk, strength and tone is noted. There is no drift, tremor or rebound. Romberg is negative. Reflexes are 2+ throughout. Babinski: Toes are flexor bilaterally. Fine motor skills and coordination: intact with normal finger taps, normal hand movements, normal rapid alternating patting, normal foot taps and normal foot agility.  Cerebellar testing: No dysmetria or intention tremor on finger to nose testing. Heel to shin is unremarkable bilaterally. There is no truncal or gait ataxia.  Sensory exam: intact to light touch, pinprick, vibration, temperature sense in the upper and lower extremities.  Gait, station and balance: She stands easily. No  veering to one side is noted. No leaning to one side is noted. Posture is age-appropriate and stance is narrow based. Gait shows normal stride length and normal pace. No problems turning are noted. She turns en bloc. Tandem walk is unremarkable.  Assessment and Plan:   In summary, WILLA BROCKS is a very pleasant 64 y.o.-year old female with an underlying  medical history of hyperlipidemia, hypertension, vitamin D deficiency, chronic kidney disease, low back pain and obesity, who was diagnosed with obstructive sleep apnea several years ago. She was placed on CPAP therapy and the time and today have reviewed her sleep test results from January 2011. I do not have her CPAP titration study results available for review. She has older CPAP machine which is at least 66 years old and has not been using her CPAP because of air leaking. She is encouraged to start using her machine again to the best of her abilities and we will try to reevaluate her sleep apnea and adjust her settings and provide a new and better fitting mask after her sleep study. She will need reevaluation since it has been so many years. Hopefully she can also have a new machine since she has an older machine. Her symptoms include morning headaches, nonrestorative sleep, excessive daytime somnolence and nocturia. She has had some palpitations at night. In addition, she endorses restless leg symptoms.  I had a long chat with the patient about my findings and the diagnosis of OSA, its prognosis and treatment options. We talked about medical treatments, surgical interventions and non-pharmacological approaches. I explained in particular the risks and ramifications of untreated moderate to severe OSA, especially with respect to developing cardiovascular disease down the Road, including congestive heart failure, difficult to treat hypertension, cardiac arrhythmias, or stroke. Even type 2 diabetes has, in part, been linked to untreated OSA. Symptoms of  untreated OSA include daytime sleepiness, memory problems, mood irritability and mood disorder such as depression and anxiety, lack of energy, as well as recurrent headaches, especially morning headaches. We talked about trying to maintain a healthy lifestyle in general, as well as the importance of weight control. I encouraged the patient to eat healthy, exercise daily and keep well hydrated, to keep a scheduled bedtime and wake time routine, to not skip any meals and eat healthy snacks in between meals. I advised the patient not to drive when feeling sleepy. I recommended the following at this time: sleep study with potential positive airway pressure titration. (We will score hypopneas at 4% and split the sleep study into diagnostic and treatment portion, if the estimated. 2 hour AHI is >15/h).   I explained the sleep test procedure to the patient and also outlined possible surgical and non-surgical treatment options of OSA, including the use of a custom-made dental device (which would require a referral to a specialist dentist or oral surgeon), upper airway surgical options, such as pillar implants, radiofrequency surgery, tongue base surgery, and UPPP (which would involve a referral to an ENT surgeon). Rarely, jaw surgery such as mandibular advancement may be considered.  I also explained the CPAP treatment option to the patient, who indicated that she would be willing to try CPAP if the need arises. I explained the importance of being compliant with PAP treatment, not only for insurance purposes but primarily to improve Her symptoms, and for the patient's long term health benefit, including to reduce Her cardiovascular risks. I answered all her questions today and the patient was in agreement. I would like to see her back after the sleep study is completed and encouraged her to call with any interim questions, concerns, problems or updates.   Thank you very much for allowing me to participate in the care  of this nice patient. If I can be of any further assistance to you please do not hesitate to call me at [phone].  Sincerely,  Star Age, MD, PhD

## 2014-11-03 DIAGNOSIS — N650 Deformity of reconstructed breast: Secondary | ICD-10-CM | POA: Diagnosis not present

## 2014-11-03 DIAGNOSIS — N651 Disproportion of reconstructed breast: Secondary | ICD-10-CM | POA: Diagnosis not present

## 2014-11-03 DIAGNOSIS — Z853 Personal history of malignant neoplasm of breast: Secondary | ICD-10-CM | POA: Diagnosis not present

## 2014-11-06 ENCOUNTER — Ambulatory Visit (INDEPENDENT_AMBULATORY_CARE_PROVIDER_SITE_OTHER): Payer: Medicare Other | Admitting: Neurology

## 2014-11-06 DIAGNOSIS — G479 Sleep disorder, unspecified: Secondary | ICD-10-CM

## 2014-11-06 DIAGNOSIS — G4733 Obstructive sleep apnea (adult) (pediatric): Secondary | ICD-10-CM

## 2014-11-06 DIAGNOSIS — G4761 Periodic limb movement disorder: Secondary | ICD-10-CM

## 2014-11-06 DIAGNOSIS — R011 Cardiac murmur, unspecified: Secondary | ICD-10-CM | POA: Diagnosis not present

## 2014-11-06 DIAGNOSIS — G4720 Circadian rhythm sleep disorder, unspecified type: Secondary | ICD-10-CM

## 2014-11-06 DIAGNOSIS — I10 Essential (primary) hypertension: Secondary | ICD-10-CM | POA: Diagnosis not present

## 2014-11-06 DIAGNOSIS — R002 Palpitations: Secondary | ICD-10-CM | POA: Diagnosis not present

## 2014-11-06 DIAGNOSIS — E780 Pure hypercholesterolemia: Secondary | ICD-10-CM | POA: Diagnosis not present

## 2014-11-06 NOTE — Sleep Study (Signed)
Please see the scanned sleep study interpretation located in the Procedure tab within the Chart Review section. 

## 2014-11-17 ENCOUNTER — Telehealth: Payer: Self-pay | Admitting: Neurology

## 2014-11-17 NOTE — Telephone Encounter (Signed)
Please call and notify the patient that the recent sleep study thankfully did not show any significant obstructive sleep apnea, but she had some leg twitching which I believe was disruptive to sleep. Please inform patient that I would like to go over the details of the study during a follow up appointment and arrange a followup appointment. Also, route or fax report to PCP and referring MD, if other than PCP.  Once you have spoken to patient, you can close this encounter.   Thanks,  Star Age, MD, PhD [facility])

## 2014-11-20 NOTE — Telephone Encounter (Signed)
Left message to call back for sleep study results.  

## 2014-11-21 NOTE — Telephone Encounter (Signed)
Sleep study has been faxed to referring doctor. I spoke to patient and she is aware of results and scheduled an appt to talk about the sleep study on 5/6.

## 2014-11-21 NOTE — Telephone Encounter (Signed)
Pt was returning phone call to receive sleep study results for study performed on 11/06/2014. Please call pt back.

## 2014-11-24 ENCOUNTER — Ambulatory Visit: Payer: Self-pay | Admitting: Neurology

## 2014-11-29 DIAGNOSIS — R011 Cardiac murmur, unspecified: Secondary | ICD-10-CM | POA: Diagnosis not present

## 2014-11-30 ENCOUNTER — Encounter (HOSPITAL_BASED_OUTPATIENT_CLINIC_OR_DEPARTMENT_OTHER): Admission: RE | Payer: Self-pay | Source: Ambulatory Visit

## 2014-11-30 ENCOUNTER — Encounter: Payer: Self-pay | Admitting: Neurology

## 2014-11-30 ENCOUNTER — Ambulatory Visit (HOSPITAL_BASED_OUTPATIENT_CLINIC_OR_DEPARTMENT_OTHER): Admission: RE | Admit: 2014-11-30 | Payer: Medicare Other | Source: Ambulatory Visit | Admitting: Plastic Surgery

## 2014-11-30 ENCOUNTER — Ambulatory Visit (INDEPENDENT_AMBULATORY_CARE_PROVIDER_SITE_OTHER): Payer: Medicare Other | Admitting: Neurology

## 2014-11-30 VITALS — BP 132/68 | HR 76 | Resp 16 | Ht 61.0 in | Wt 180.0 lb

## 2014-11-30 DIAGNOSIS — G4761 Periodic limb movement disorder: Secondary | ICD-10-CM | POA: Diagnosis not present

## 2014-11-30 DIAGNOSIS — M545 Low back pain, unspecified: Secondary | ICD-10-CM

## 2014-11-30 DIAGNOSIS — G2581 Restless legs syndrome: Secondary | ICD-10-CM | POA: Diagnosis not present

## 2014-11-30 SURGERY — LIPOSUCTION
Anesthesia: General | Laterality: Left

## 2014-11-30 MED ORDER — GABAPENTIN 100 MG PO CAPS: ORAL_CAPSULE | ORAL | Status: DC

## 2014-11-30 NOTE — Progress Notes (Signed)
Subjective:    Patient ID: Martha Pham is a 64 y.o. female.  HPI     Interim history:   Martha Pham is a 64 year old right-handed woman with an underlying medical history of hyperlipidemia, hypertension, vitamin D deficiency, chronic kidney disease, breast cancer in 2012, s/p lumpectomy x 2 and then L mastectomy on 09/01/11, s/p chemo, low back pain and obesity, who presents for follow-up consultation after her recent sleep study. The patient is unaccompanied today. I first met her on 10/31/2014 at the request of her primary care physician, at which time the patient reported a prior diagnosis of obstructive sleep apnea. I suggested we bring her back for sleep study. She is a baseline sleep study on 11/06/2014 and underwent over her test results with her in detail today. Her sleep efficiency was reduced at 78.7% with a normal sleep latency and wake after sleep onset of 71.5 minutes with moderate to severe sleep fragmentation noted. She had an elevated arousal index. She had an increased percentage of stage I and stage II sleep, near absence of slow-wave sleep and a markedly decreased percentage of REM sleep at 3.9% with a very long REM latency. She had mild PLMS at 13.9 per hour, with arousals. She had a normal EKG and EEG. She had mild to moderate intermittent snoring. Her total AHI was normal at 1.4 per hour, baseline oxygen saturation 96%, nadir was 90%. REM AHI was 26.1 per hour.  Today, 11/24/2014: She reports feeling fairly stable. She has occasional midline low back pain and sometimes the pain seems to radiate to the left leg. She's never had back surgery. She has occasional restless leg symptoms but is not aware of her leg twitchiness at night. She is usually reasonably or fairly well rested when she first wakes up. She has had no new medication changes. Unfortunately she had a little bit of skin reaction to the EKG leads from the sleep study which is healing now. She also has noticed some smaller  bruises in her arms, she's not on aspirin. She has not had any injuries or bumped into something knowingly. She turned 64 yesterday and feels overall well.   Previously:   She was diagnosed with obstructive sleep apnea several years ago. She was placed on CPAP therapy. I reviewed her baseline sleep study results performed at the [facility] on 08/05/2009: Sleep efficiency was 91.4%. REM latency was 209 minutes. Arousal index was 19.1 per hour. Her total AHI was 9.3 per hour. Her REM AHI was 36.7 per hour. Average oxygen saturation was 96%, nadir was 82%. CPAP titration was recommended.   I reviewed her blood test results from 09/14/2014 through your office: TSH was unremarkable, hemoglobin A1c was 5.6, total cholesterol 177, HDL 58, LDL 97, CMP unremarkable, CBC with differential unremarkable very she had an EKG on 09/14/2014 which upon my review looked unremarkable.  She brought her CPAP machine but reports that she has not used it in the last few months because of air leaking. There is no SD card of the machine.   She has lack of energy and occasional AM HAs, occasional night time palpitations and nocturia x 2 on average. In February, her GYN noted a new heart murmur, but her during her appointment on 09/14/14 showed no murmur and EKG was fine.   Her bedtime is around 11:30 PM and typically she falls asleep quickly. Sometimes she wakes up in the middle of the night other than going to the bathroom.  She has restless leg symptoms almost on a nightly basis but is not sure if she twitches or kicks in her sleep. She has no parasomnias. Her rise time is around 8 and she wakes up marginally rested on most days. She sleeps alone. She lives alone. She has one daughter who lives in the area but will be moving to [place] soon. The patient is retired and worked for [employer]. She is a nonsmoker and drinks alcohol rarely. She drinks coffee maybe once a day.  Her Past Medical History Is  Significant For: Past Medical History  Diagnosis Date  . Hypertension   . Hyperlipemia   . Cancer     lt. breast ca-snbx  . Arthritis   . Contact lens/glasses fitting     wears contacts or glasses  . Wears dentures     upper  . Sleep apnea     SLEEP STUDY DX 10,CPAP BUT DON'TUSE  . Apnea, sleep     does wear cpap  . Heart murmur   . Chronic kidney disease   . Vitamin D deficiency   . Malaise and fatigue   . Hypercholesteremia   . Lower back pain     Her Past Surgical History Is Significant For: Past Surgical History  Procedure Laterality Date  . Tonsillectomy    . Mouth surgery      TOOTH IMPLANT BOTTOM X2 LFT  . Portacath placement  06/24/2011    Procedure: INSERTION PORT-A-CATH;  Surgeon: Judieth Keens, DO;  Location: [place];  Service: General;  Laterality: Right;  Right mediport placement  . Simple mastectomy  09/01/2011    left  . Breast lumpectomy w/ needle localization  06/24/2011    left  . Breast surgery  08/07/2011    re-exc. left breast margins  . Reconstruction breast immediate / delayed w/ tissue expander  10/13    left  . Breast implant exchange Left 09/23/2012    Procedure: REMOVAL OF LEFT EXPANDER/PLACEMENT OF IMPLANT WITH REDUCTION OF RIGHT BREAST;  Surgeon: Theodoro Kos, DO;  Location: [place];  Service: Plastics;  Laterality: Left;  . Breast reduction surgery Right 09/23/2012    Procedure: MAMMARY REDUCTION  (BREAST);  Surgeon: Theodoro Kos, DO;  Location: [place];  Service: Plastics;  Laterality: Right;  . Port-a-cath removal Right 09/23/2012    Procedure: REMOVAL PORT-A-CATH;  Surgeon: Theodoro Kos, DO;  Location: [place];  Service: Plastics;  Laterality: Right;  . Liposuction Bilateral 09/23/2012    Procedure: LIPOSUCTION;  Surgeon: Theodoro Kos, DO;  Location: [place];  Service: Plastics;  Laterality: Bilateral;  . Cyst removal neck  08/2013    Dr. Migdalia Dk     Her Family History Is Significant For: Family History  Problem Relation Age of Onset  . Breast cancer Sister 40  . Brain cancer Father   . Heart attack Mother     Her Social History Is Significant For: History   Social History  . Marital Status: Widowed    Spouse Name: N/A  . Number of Children: 2  . Years of Education: 14   Occupational History  . Retired    Social History Main Topics  . Smoking status: Never Smoker   . Smokeless tobacco: Never Used  . Alcohol Use: 1.2 oz/week    2 Glasses of wine per week     Comment: 8 oz occasionally per week  . Drug Use: No  . Sexual Activity:  Partners: Male    Patent examiner Protection: Post-menopausal   Other Topics Concern  . None   Social History Narrative   Daughter Martha Pham, Martha Pham    Her Allergies Are:  Allergies  Allergen Reactions  . Dust Mite Extract Itching  . Other     grass  :   Her Current Medications Are:  Outpatient Encounter Prescriptions as of 11/30/2014  Medication Sig  . atorvastatin (LIPITOR) 10 MG tablet Take 10 mg by mouth daily.    . calcium carbonate (OS-CAL) 600 MG TABS Take 600 mg by mouth 2 (two) times daily with a meal.    . Carboxymethylcellulose Sodium (REFRESH OP) Apply 1 drop to eye 2 (two) times daily as needed. For dry eyes  . diazepam (VALIUM) 2 MG tablet   . fish oil-omega-3 fatty acids 1000 MG capsule Take 2 g by mouth daily.  Marland Kitchen losartan-hydrochlorothiazide (HYZAAR) 50-12.5 MG per tablet 1 tablet daily.    No facility-administered encounter medications on file as of 11/30/2014.  :  Review of Systems:  Out of a complete 14 point review of systems, all are reviewed and negative with the exception of these symptoms as listed below:   Review of Systems  Hematological:       Patient has noticed that she has started to bruise easily    Objective:  Neurologic Exam  Physical Exam Physical Examination:   Filed Vitals:   11/30/14 1456  BP: 132/68  Pulse: 76  Resp: 16    General Examination: The patient is a very pleasant 64 y.o. female in no acute distress. She appears well-developed and well-nourished and very well groomed.   HEENT: Normocephalic, atraumatic, pupils are equal, round and reactive to light and accommodation. Funduscopic exam is normal with sharp disc margins noted. Extraocular tracking is good without limitation to gaze excursion or nystagmus noted. Normal smooth pursuit is noted. Hearing is grossly intact. Face is symmetric with normal facial animation and normal facial sensation. Speech is clear with no dysarthria noted. There is no hypophonia. There is no lip, neck/head, jaw or voice tremor. Neck is supple with full range of passive and active motion. There are no carotid bruits on auscultation. Oropharynx exam reveals: mild mouth dryness, good dental hygiene and mild airway crowding, due to narrow airway entry, elongated tongue and redundant soft palate. Tonsils are absent. Mallampati is class II. Tongue protrudes centrally and palate elevates symmetrically. She has a very mild overbite.  Chest: Clear to auscultation without wheezing, rhonchi or crackles noted.  Heart: S1+S2+0, regular and normal without murmurs, rubs or gallops noted.   Abdomen: Soft, non-tender and non-distended with normal bowel sounds appreciated on auscultation.  Extremities: There is no pitting edema in the distal lower extremities bilaterally. Pedal pulses are intact.  Skin: Warm and dry without trophic changes noted. There are no varicose veins.  Musculoskeletal: exam reveals no obvious joint deformities, tenderness or joint swelling or erythema.   Neurologically:  Mental status: The patient is awake, alert and oriented in all 4 spheres. Her immediate and remote memory, attention, language skills and fund of knowledge are appropriate. There is no evidence of aphasia, agnosia, apraxia or anomia. Speech is clear with normal prosody and enunciation. Thought process is  linear. Mood is normal and affect is normal.  Cranial nerves II - XII are as described above under HEENT exam. In addition: shoulder shrug is normal with equal shoulder height noted. Motor exam: Normal bulk, strength and tone is noted. There is no drift,  tremor or rebound. Romberg is negative. Reflexes are 2+ throughout. Babinski: Toes are flexor bilaterally. Fine motor skills and coordination: intact with normal finger taps, normal hand movements, normal rapid alternating patting, normal foot taps and normal foot agility.  Cerebellar testing: No dysmetria or intention tremor on finger to nose testing. Heel to shin is unremarkable bilaterally. There is no truncal or gait ataxia.  Sensory exam: intact to light touch, pinprick, vibration, temperature sense in the upper and lower extremities.  Gait, station and balance: She stands easily. No veering to one side is noted. No leaning to one side is noted. Posture is age-appropriate and stance is narrow based. Gait shows normal stride length and normal pace. No problems turning are noted. She turns en bloc. Tandem walk is unremarkable.  Assessment and Plan:   In summary, TIMBER MARSHMAN is a very pleasant 64 year old female with an underlying medical history of hyperlipidemia, hypertension, vitamin D deficiency, chronic kidney disease, low back pain and obesity, who was diagnosed with obstructive sleep apnea several years ago. She was placed on CPAP therapy and the time she had a recent sleep study which did not show any significant obstructive sleep apnea. She has sleep disordered breathing during REM sleep with no significant desaturations. She did not have sleep consolidation and had evidence of mild periodic leg movements with arousals. She endorses some low back pain and at times at night she does feel a little restless especially on her left side. I went over her test results with her in detail today. I suggested that we not pursue CPAP therapy at this time  but she is advised to try to lose weight and sleep off her back. She is advised to stay well-hydrated. In addition, I would like to suggest a small dose of gabapentin, 100 mg strength at night as needed to see if we can tone down the leg twitching and some of the back achiness at night. She is willing to try it. I talked to her about potential side effects but also pointed out to her that this dose is quite low. We may have to increase it if it's not effective enough. I suggested a six-month checkup. I encouraged her to call or email with any interim questions or updates or concerns. I provided her with a new prescription for gabapentin 100 mg strength one pill at bedtime as needed. I answered all her questions today and the patient was in agreement. I spent 25 minutes in total face-to-face time with the patient, more than 50% of which was spent in counseling and coordination of care, reviewing test results, reviewing medication and discussing or reviewing the diagnosis of RLS and PLMD, the prognosis and treatment options.

## 2014-11-30 NOTE — Patient Instructions (Signed)
For your sleep related leg twitching and low back pain at night, let's try Neurontin (gabapentin) 100 mg strength: Take 1 pill at bedtime as needed. The most common side effects reported are sedation or sleepiness. Rare side effects include balance problems, confusion.

## 2014-12-05 DIAGNOSIS — L700 Acne vulgaris: Secondary | ICD-10-CM | POA: Diagnosis not present

## 2014-12-05 DIAGNOSIS — Z0000 Encounter for general adult medical examination without abnormal findings: Secondary | ICD-10-CM | POA: Diagnosis not present

## 2014-12-05 DIAGNOSIS — I10 Essential (primary) hypertension: Secondary | ICD-10-CM | POA: Diagnosis not present

## 2014-12-05 DIAGNOSIS — E559 Vitamin D deficiency, unspecified: Secondary | ICD-10-CM | POA: Diagnosis not present

## 2014-12-05 DIAGNOSIS — E780 Pure hypercholesterolemia: Secondary | ICD-10-CM | POA: Diagnosis not present

## 2014-12-05 DIAGNOSIS — R7309 Other abnormal glucose: Secondary | ICD-10-CM | POA: Diagnosis not present

## 2015-02-14 DIAGNOSIS — Z853 Personal history of malignant neoplasm of breast: Secondary | ICD-10-CM | POA: Diagnosis not present

## 2015-04-03 ENCOUNTER — Telehealth: Payer: Self-pay | Admitting: Hematology and Oncology

## 2015-04-03 ENCOUNTER — Ambulatory Visit (HOSPITAL_BASED_OUTPATIENT_CLINIC_OR_DEPARTMENT_OTHER): Payer: Medicare Other | Admitting: Hematology and Oncology

## 2015-04-03 ENCOUNTER — Other Ambulatory Visit (HOSPITAL_BASED_OUTPATIENT_CLINIC_OR_DEPARTMENT_OTHER): Payer: Medicare Other

## 2015-04-03 ENCOUNTER — Encounter: Payer: Self-pay | Admitting: Hematology and Oncology

## 2015-04-03 VITALS — BP 174/85 | HR 73 | Temp 98.3°F | Resp 18 | Ht 61.0 in | Wt 165.6 lb

## 2015-04-03 DIAGNOSIS — Z853 Personal history of malignant neoplasm of breast: Secondary | ICD-10-CM | POA: Diagnosis not present

## 2015-04-03 DIAGNOSIS — C50212 Malignant neoplasm of upper-inner quadrant of left female breast: Secondary | ICD-10-CM

## 2015-04-03 LAB — CBC WITH DIFFERENTIAL/PLATELET
BASO%: 0.4 % (ref 0.0–2.0)
Basophils Absolute: 0 10*3/uL (ref 0.0–0.1)
EOS%: 1.3 % (ref 0.0–7.0)
Eosinophils Absolute: 0.1 10*3/uL (ref 0.0–0.5)
HCT: 39.1 % (ref 34.8–46.6)
HGB: 12.9 g/dL (ref 11.6–15.9)
LYMPH%: 39.2 % (ref 14.0–49.7)
MCH: 32.1 pg (ref 25.1–34.0)
MCHC: 33 g/dL (ref 31.5–36.0)
MCV: 97.2 fL (ref 79.5–101.0)
MONO#: 0.4 10*3/uL (ref 0.1–0.9)
MONO%: 10 % (ref 0.0–14.0)
NEUT#: 2.1 10*3/uL (ref 1.5–6.5)
NEUT%: 49.1 % (ref 38.4–76.8)
Platelets: 176 10*3/uL (ref 145–400)
RBC: 4.02 10*6/uL (ref 3.70–5.45)
RDW: 12.2 % (ref 11.2–14.5)
WBC: 4.2 10*3/uL (ref 3.9–10.3)
lymph#: 1.7 10*3/uL (ref 0.9–3.3)

## 2015-04-03 LAB — COMPREHENSIVE METABOLIC PANEL (CC13)
ALT: 18 U/L (ref 0–55)
AST: 15 U/L (ref 5–34)
Albumin: 3.9 g/dL (ref 3.5–5.0)
Alkaline Phosphatase: 74 U/L (ref 40–150)
Anion Gap: 9 mEq/L (ref 3–11)
BUN: 7.9 mg/dL (ref 7.0–26.0)
CO2: 24 mEq/L (ref 22–29)
Calcium: 9.7 mg/dL (ref 8.4–10.4)
Chloride: 109 mEq/L (ref 98–109)
Creatinine: 0.8 mg/dL (ref 0.6–1.1)
EGFR: 86 mL/min/{1.73_m2} — ABNORMAL LOW (ref >90)
Glucose: 112 mg/dl (ref 70–140)
Potassium: 3.1 mEq/L — ABNORMAL LOW (ref 3.5–5.1)
Sodium: 142 mEq/L (ref 136–145)
Total Bilirubin: 0.64 mg/dL (ref 0.20–1.20)
Total Protein: 7.4 g/dL (ref 6.4–8.3)

## 2015-04-03 NOTE — Progress Notes (Signed)
Patient Care Team: Robyn Sanders, MD as PCP - General (Internal Medicine)  DIAGNOSIS: Breast cancer of upper-inner quadrant of left female breast   Staging form: Breast, AJCC 7th Edition     Clinical: Stage IIA (T2, N0, cM0) - Signed by Stacy Wentworth, MD on 06/12/2011     Pathologic: Stage IIA (T2, N0, cM0) - Signed by Vinay K Gudena, MD on 03/28/2014   SUMMARY OF ONCOLOGIC HISTORY:   Breast cancer of upper-inner quadrant of left female breast   06/12/2011 Initial Diagnosis Cancer of upper-inner quadrant of female breast: Grade 3 IDC ER negative PR negative HER-2 negative, MRI 2.7 x 2.3 x 1 cm mass   06/24/2011 Surgery Left breast lumpectomy 2.2 cm IDC grade 3 with high-grade DCIS triple negative, for positive margins patient had reexcision: 2 lymph nodes negative ER/PR negative HER-2 negative ratio 0.97 Ki-67 75%   09/02/2011 Surgery Left mastectomy   10/01/2011 - 12/03/2011 Chemotherapy Adjuvant Taxotere and Cytoxan x4 cycles   07/05/2012 Surgery Breast reconstruction on the left breast with reduction of right breast    CHIEF COMPLIANT: surveillance of breast cancer  INTERVAL HISTORY: Martha Pham is a 64-year-old with above-mentioned history of left breast cancer triple negative disease underwent left mastectomy followed by adjuvant chemotherapy and reconstruction of the left breast with reduction of the right breast. She comes in for 6 month follow-up visit and reports that she is doing quite well. She continues to have intermittent pelvic pain as well as some some bone pain. Previous workup including a CT of the abdomen September 2015and a bone scan March 2016 were all normal. She has occasional restless aches and pains. The scars around the port removal and the right breast scar appeared to have some keloid formation. There is no appearance of keloid in the left breast reconstruction.  REVIEW OF SYSTEMS:   Constitutional: Denies fevers, chills or abnormal weight loss Eyes: Denies  blurriness of vision Ears, nose, mouth, throat, and face: Denies mucositis or sore throat Respiratory: Denies cough, dyspnea or wheezes Cardiovascular: Denies palpitation, chest discomfort or lower extremity swelling Gastrointestinal:  Denies nausea, heartburn or change in bowel habits Skin: Denies abnormal skin rashes Lymphatics: Denies new lymphadenopathy or easy bruising Neurological:Denies numbness, tingling or new weaknesses Behavioral/Psych: Mood is stable, no new changes  Breast: keloids and intermittent breast tenderness All other systems were reviewed with the patient and are negative.  I have reviewed the past medical history, past surgical history, social history and family history with the patient and they are unchanged from previous note.  ALLERGIES:  is allergic to dust mite extract and other.  MEDICATIONS:  Current Outpatient Prescriptions  Medication Sig Dispense Refill  . atorvastatin (LIPITOR) 10 MG tablet Take 10 mg by mouth daily.      . calcium carbonate (OS-CAL) 600 MG TABS Take 600 mg by mouth 2 (two) times daily with a meal.      . Carboxymethylcellulose Sodium (REFRESH OP) Apply 1 drop to eye 2 (two) times daily as needed. For dry eyes    . diazepam (VALIUM) 2 MG tablet     . fish oil-omega-3 fatty acids 1000 MG capsule Take 2 g by mouth daily.    . gabapentin (NEURONTIN) 100 MG capsule Take 1 pill at bedtime as needed for leg pain. 30 capsule 5  . losartan-hydrochlorothiazide (HYZAAR) 50-12.5 MG per tablet 1 tablet daily.     . [DISCONTINUED] MICARDIS HCT 40-12.5 MG per tablet Take 1 tablet by mouth Daily.       No current facility-administered medications for this visit.    PHYSICAL EXAMINATION: ECOG PERFORMANCE STATUS: 1 - Symptomatic but completely ambulatory  Filed Vitals:   04/03/15 1124  BP: 174/85  Pulse: 73  Temp: 98.3 F (36.8 C)  Resp: 18   Filed Weights   04/03/15 1124  Weight: 165 lb 9.6 oz (75.116 kg)    GENERAL:alert, no distress  and comfortable SKIN: skin color, texture, turgor are normal, no rashes or significant lesions EYES: normal, Conjunctiva are pink and non-injected, sclera clear OROPHARYNX:no exudate, no erythema and lips, buccal mucosa, and tongue normal  NECK: supple, thyroid normal size, non-tender, without nodularity LYMPH:  no palpable lymphadenopathy in the cervical, axillary or inguinal LUNGS: clear to auscultation and percussion with normal breathing effort HEART: regular rate & rhythm and no murmurs and no lower extremity edema ABDOMEN:abdomen soft, non-tender and normal bowel sounds Musculoskeletal:no cyanosis of digits and no clubbing  NEURO: alert & oriented x 3 with fluent speech, no focal motor/sensory deficits BREAST: No palpable masses or nodules in either right or left breasts. No palpable axillary supraclavicular or infraclavicular adenopathy no breast tenderness or nipple discharge. There are keloids involving the site of port removal as well as scar tissue in the medial aspect of the right breast (exam performed in the presence of a chaperone)  LABORATORY DATA:  I have reviewed the data as listed   Chemistry      Component Value Date/Time   NA 142 04/03/2015 1109   NA 139 09/22/2012 1430   K 3.1* 04/03/2015 1109   K 3.6 09/22/2012 1430   CL 105 09/22/2012 1430   CL 106 08/17/2012 1351   CO2 24 04/03/2015 1109   CO2 26 09/22/2012 1430   BUN 7.9 04/03/2015 1109   BUN 13 09/22/2012 1430   CREATININE 0.8 04/03/2015 1109   CREATININE 0.71 09/22/2012 1430      Component Value Date/Time   CALCIUM 9.7 04/03/2015 1109   CALCIUM 9.5 09/22/2012 1430   ALKPHOS 74 04/03/2015 1109   ALKPHOS 64 02/11/2012 1315   AST 15 04/03/2015 1109   AST 17 02/11/2012 1315   ALT 18 04/03/2015 1109   ALT 16 02/11/2012 1315   BILITOT 0.64 04/03/2015 1109   BILITOT 0.3 02/11/2012 1315       Lab Results  Component Value Date   WBC 4.2 04/03/2015   HGB 12.9 04/03/2015   HCT 39.1 04/03/2015   MCV  97.2 04/03/2015   PLT 176 04/03/2015   NEUTROABS 2.1 04/03/2015   ASSESSMENT & PLAN:  Breast cancer of upper-inner quadrant of left female breast Left breast cancer treated with lumpectomy 06/24/2011, later underwent mastectomy 09/02/2011, 2.2 cm grade 3 with high-grade DCIS, triple negative, 2 lymph nodes negative, Ki-67 75%, adjuvant chemotherapy with Taxotere and Cytoxan completed 12/03/2011 followed by breast reconstruction 07/05/2012  Breast cancer surveillance: 1. Breast exam 09/13//2016 is normal 2. Mammogram 05/18/2014 is normal  Bone pain: bone scan is normal in March 2015 Tennis elbow: much improved Return to clinic in 6 monthsr for follow-up exams and after that we will see her once a year.  No orders of the defined types were placed in this encounter.   The patient has a good understanding of the overall plan. she agrees with it. she will call with any problems that may develop before the next visit here.   Rulon Eisenmenger, MD

## 2015-04-03 NOTE — Assessment & Plan Note (Signed)
Left breast cancer treated with lumpectomy 06/24/2011, later underwent mastectomy 09/02/2011, 2.2 cm grade 3 with high-grade DCIS, triple negative, 2 lymph nodes negative, Ki-67 75%, adjuvant chemotherapy with Taxotere and Cytoxan completed 12/03/2011 followed by breast reconstruction 07/05/2012  Breast cancer surveillance: 1. Breast exam 09/13//2016 is normal 2. Mammogram 05/18/2014 is normal  Bone pain: bone scan is normal in March 2015 Tennis elbow: much improved Return to clinic in 1 year for follow-up exams. 

## 2015-04-03 NOTE — Telephone Encounter (Signed)
Gave avs & calendar for March 2017. °

## 2015-04-09 DIAGNOSIS — E780 Pure hypercholesterolemia: Secondary | ICD-10-CM | POA: Diagnosis not present

## 2015-04-09 DIAGNOSIS — I10 Essential (primary) hypertension: Secondary | ICD-10-CM | POA: Diagnosis not present

## 2015-04-09 DIAGNOSIS — I340 Nonrheumatic mitral (valve) insufficiency: Secondary | ICD-10-CM | POA: Diagnosis not present

## 2015-04-09 DIAGNOSIS — R002 Palpitations: Secondary | ICD-10-CM | POA: Diagnosis not present

## 2015-04-10 DIAGNOSIS — Z23 Encounter for immunization: Secondary | ICD-10-CM | POA: Diagnosis not present

## 2015-05-02 ENCOUNTER — Telehealth: Payer: Self-pay

## 2015-05-02 NOTE — Telephone Encounter (Signed)
I called and left message for Martha Pham, about her appt on 11/15. Patient is only scheduled for 88mn, Dr. ARexene Albertsonly has 38m appts. I asked if we can change her appt time that day. I did reserve a 10:30 spot if she wants but is welcome to anytime that day.

## 2015-05-16 DIAGNOSIS — H1045 Other chronic allergic conjunctivitis: Secondary | ICD-10-CM | POA: Diagnosis not present

## 2015-05-16 DIAGNOSIS — H2513 Age-related nuclear cataract, bilateral: Secondary | ICD-10-CM | POA: Diagnosis not present

## 2015-05-16 DIAGNOSIS — H53413 Scotoma involving central area, bilateral: Secondary | ICD-10-CM | POA: Diagnosis not present

## 2015-05-23 ENCOUNTER — Other Ambulatory Visit: Payer: Self-pay

## 2015-05-23 DIAGNOSIS — Z1231 Encounter for screening mammogram for malignant neoplasm of breast: Secondary | ICD-10-CM

## 2015-06-05 ENCOUNTER — Ambulatory Visit: Payer: Medicare Other | Admitting: Neurology

## 2015-06-05 ENCOUNTER — Ambulatory Visit (INDEPENDENT_AMBULATORY_CARE_PROVIDER_SITE_OTHER): Payer: Medicare Other | Admitting: Neurology

## 2015-06-05 ENCOUNTER — Ambulatory Visit: Payer: Self-pay | Admitting: Neurology

## 2015-06-05 ENCOUNTER — Encounter: Payer: Self-pay | Admitting: Neurology

## 2015-06-05 VITALS — BP 140/78 | HR 78 | Resp 16 | Ht 61.0 in | Wt 167.0 lb

## 2015-06-05 DIAGNOSIS — M545 Low back pain, unspecified: Secondary | ICD-10-CM

## 2015-06-05 DIAGNOSIS — G2581 Restless legs syndrome: Secondary | ICD-10-CM

## 2015-06-05 DIAGNOSIS — R011 Cardiac murmur, unspecified: Secondary | ICD-10-CM

## 2015-06-05 MED ORDER — GABAPENTIN 100 MG PO CAPS: ORAL_CAPSULE | ORAL | Status: DC

## 2015-06-05 NOTE — Progress Notes (Signed)
Subjective:    Patient ID: Martha Pham is a 64 y.o. female.  HPI     Interim history:   Martha Pham is a 64 year old right-handed woman with an underlying medical history of hyperlipidemia, hypertension, vitamin D deficiency, chronic kidney disease, breast cancer in 2012, s/p lumpectomy x 2 and then L mastectomy on 09/01/11, s/p chemo, low back pain and obesity, who presents for follow-up consultation after her recent sleep study. The patient is unaccompanied today. I last saw her on 11/30/2014, at which time she reported feeling fairly stable. She had some low back pain with radiation to the left. She had occasional restless leg symptoms. She had no new complaints. I suggested a trial of low-dose gabapentin at night, 100 mg strength as needed for residual restless leg symptoms and to help improve her back discomfort.   Today, 06/05/15: She reports doing fairly well, had recent cardiology FU, has a history of a heart murmur. No new complaints, tried the gabapentin and felt her restless legs was improved. She has intermittent back pain and is not sure of the gabapentin help with that. Nevertheless, she did not refill it. She does not recall why she did not refill it. She thought she only had one prescription. She lives alone, her daughter moved to [place]. She is going to visit her daughter in December. She is going to go on a cruise in January. She is doing well with her cancer follow-up. She has had some issues with irritation at her scar where her port was.   Previously:   I first met her on 10/31/2014 at the request of her primary care physician, at which time the patient reported a prior diagnosis of obstructive sleep apnea. I suggested we bring her back for sleep study. She is a baseline sleep study on 11/06/2014 and underwent over her test results with her in detail today. Her sleep efficiency was reduced at 78.7% with a normal sleep latency and wake after sleep onset of 71.5 minutes with  moderate to severe sleep fragmentation noted. She had an elevated arousal index. She had an increased percentage of stage I and stage II sleep, near absence of slow-wave sleep and a markedly decreased percentage of REM sleep at 3.9% with a very long REM latency. She had mild PLMS at 13.9 per hour, with arousals. She had a normal EKG and EEG. She had mild to moderate intermittent snoring. Her total AHI was normal at 1.4 per hour, baseline oxygen saturation 96%, nadir was 90%. REM AHI was 26.1 per hour.  She was diagnosed with obstructive sleep apnea several years ago. She was placed on CPAP therapy. I reviewed her baseline sleep study results performed at the [facility] on 08/05/2009: Sleep efficiency was 91.4%. REM latency was 209 minutes. Arousal index was 19.1 per hour. Her total AHI was 9.3 per hour. Her REM AHI was 36.7 per hour. Average oxygen saturation was 96%, nadir was 82%. CPAP titration was recommended.   I reviewed her blood test results from 09/14/2014 through your office: TSH was unremarkable, hemoglobin A1c was 5.6, total cholesterol 177, HDL 58, LDL 97, CMP unremarkable, CBC with differential unremarkable very she had an EKG on 09/14/2014 which upon my review looked unremarkable.  She brought her CPAP machine but reports that she has not used it in the last few months because of air leaking. There is no SD card of the machine.   She has lack of energy and occasional AM HAs, occasional  night time palpitations and nocturia x 2 on average. In February, her GYN noted a new heart murmur, but her during her appointment on 09/14/14 showed no murmur and EKG was fine.   Her bedtime is around 11:30 PM and typically she falls asleep quickly. Sometimes she wakes up in the middle of the night other than going to the bathroom. She has restless leg symptoms almost on a nightly basis but is not sure if she twitches or kicks in her sleep. She has no parasomnias. Her rise time is around  8 and she wakes up marginally rested on most days. She sleeps alone. She lives alone. She has one daughter who lives in the area but will be moving to [place] soon. The patient is retired and worked for [employer]. She is a nonsmoker and drinks alcohol rarely. She drinks coffee maybe once a day.  Her Past Medical History Is Significant For: Past Medical History  Diagnosis Date  . Hypertension   . Hyperlipemia   . Cancer ([place])     lt. breast ca-snbx  . Arthritis   . Contact lens/glasses fitting     wears contacts or glasses  . Wears dentures     upper  . Sleep apnea     SLEEP STUDY DX 10,CPAP BUT DON'TUSE  . Apnea, sleep     does wear cpap  . Heart murmur   . Chronic kidney disease   . Vitamin D deficiency   . Malaise and fatigue   . Hypercholesteremia   . Lower back pain     Her Past Surgical History Is Significant For: Past Surgical History  Procedure Laterality Date  . Tonsillectomy    . Mouth surgery      TOOTH IMPLANT BOTTOM X2 LFT  . Portacath placement  06/24/2011    Procedure: INSERTION PORT-A-CATH;  Surgeon: Judieth Keens, DO;  Location: [place];  Service: General;  Laterality: Right;  Right mediport placement  . Simple mastectomy  09/01/2011    left  . Breast lumpectomy w/ needle localization  06/24/2011    left  . Breast surgery  08/07/2011    re-exc. left breast margins  . Reconstruction breast immediate / delayed w/ tissue expander  10/13    left  . Breast implant exchange Left 09/23/2012    Procedure: REMOVAL OF LEFT EXPANDER/PLACEMENT OF IMPLANT WITH REDUCTION OF RIGHT BREAST;  Surgeon: Theodoro Kos, DO;  Location: [place];  Service: Plastics;  Laterality: Left;  . Breast reduction surgery Right 09/23/2012    Procedure: MAMMARY REDUCTION  (BREAST);  Surgeon: Theodoro Kos, DO;  Location: [place];  Service: Plastics;  Laterality: Right;  . Port-a-cath removal Right 09/23/2012    Procedure: REMOVAL  PORT-A-CATH;  Surgeon: Theodoro Kos, DO;  Location: [place];  Service: Plastics;  Laterality: Right;  . Liposuction Bilateral 09/23/2012    Procedure: LIPOSUCTION;  Surgeon: Theodoro Kos, DO;  Location: [place];  Service: Plastics;  Laterality: Bilateral;  . Cyst removal neck  08/2013    Dr. Migdalia Dk    Her Family History Is Significant For: Family History  Problem Relation Age of Onset  . Breast cancer Sister 66  . Brain cancer Father   . Heart attack Mother     Her Social History Is Significant For: Social History   Social History  . Marital Status: Widowed    Spouse Name: N/A  . Number of Children: 2  . Years of Education:  14   Occupational History  . Retired    Social History Main Topics  . Smoking status: Never Smoker   . Smokeless tobacco: Never Used  . Alcohol Use: 1.2 oz/week    2 Glasses of wine per week     Comment: 8 oz occasionally per week  . Drug Use: No  . Sexual Activity:    Partners: Male    Birth Control/ Protection: Post-menopausal   Other Topics Concern  . None   Social History Narrative   Daughter [place], [place]    Her Allergies Are:  Allergies  Allergen Reactions  . Dust Mite Extract Itching  . Other     grass  :   Her Current Medications Are:  Outpatient Encounter Prescriptions as of 06/05/2015  Medication Sig  . acetaminophen (TYLENOL) 500 MG tablet Take 500 mg by mouth every 6 (six) hours as needed.  Marland Kitchen atorvastatin (LIPITOR) 10 MG tablet Take 10 mg by mouth daily.    . calcium carbonate (OS-CAL) 600 MG TABS Take 600 mg by mouth 2 (two) times daily with a meal.    . Carboxymethylcellulose Sodium (REFRESH OP) Apply 1 drop to eye 2 (two) times daily as needed. For dry eyes  . diazepam (VALIUM) 2 MG tablet   . fish oil-omega-3 fatty acids 1000 MG capsule Take 2 g by mouth daily.  Marland Kitchen loratadine (CLARITIN) 10 MG tablet Take 10 mg by mouth daily.  Marland Kitchen losartan-hydrochlorothiazide (HYZAAR) 50-12.5 MG per  tablet 1 tablet daily.   . [DISCONTINUED] gabapentin (NEURONTIN) 100 MG capsule Take 1 pill at bedtime as needed for leg pain.   No facility-administered encounter medications on file as of 06/05/2015.  :  Review of Systems:  Out of a complete 14 point review of systems, all are reviewed and negative with the exception of these symptoms as listed below:  Review of Systems  Neurological:       Patient states that she has no new concerns. She reports that the gabapentin did help her RLS but is not taking it right now.     Objective:  Neurologic Exam  Physical Exam Physical Examination:   Filed Vitals:   06/05/15 1611  BP: 140/78  Pulse: 78  Resp: 16   General Examination: The patient is a very pleasant 65 y.o. female in no acute distress. She appears well-developed and well-nourished and very well groomed. She is in good spirits today.   HEENT: Normocephalic, atraumatic, pupils are equal, round and reactive to light and accommodation. Extraocular tracking is good without limitation to gaze excursion or nystagmus noted. Normal smooth pursuit is noted. Hearing is grossly intact. Face is symmetric with normal facial animation and normal facial sensation. Speech is clear with no dysarthria noted. There is no hypophonia. There is no lip, neck/head, jaw or voice tremor. Neck is supple with full range of passive and active motion. There are no carotid bruits on auscultation. Oropharynx exam reveals: mild mouth dryness, good dental hygiene and mild airway crowding, due to narrow airway entry, elongated tongue and redundant soft palate. Tonsils are absent. Mallampati is class II. Tongue protrudes centrally and palate elevates symmetrically. She has a very mild overbite.  Chest: Clear to auscultation without wheezing, rhonchi or crackles noted.  Heart: S1+S2+0, regular and normal without murmurs, rubs or gallops noted. Perhaps a transient systolic flow murmur noted.   Abdomen: Soft, non-tender  and non-distended with normal bowel sounds appreciated on auscultation.  Extremities: There is no pitting edema in the distal  lower extremities bilaterally. Pedal pulses are intact.  Skin: Warm and dry without trophic changes noted. There are no varicose veins.  Musculoskeletal: exam reveals no obvious joint deformities, tenderness or joint swelling or erythema.   Neurologically:  Mental status: The patient is awake, alert and oriented in all 4 spheres. Her immediate and remote memory, attention, language skills and fund of knowledge are appropriate. There is no evidence of aphasia, agnosia, apraxia or anomia. Speech is clear with normal prosody and enunciation. Thought process is linear. Mood is normal and affect is normal.  Cranial nerves II - XII are as described above under HEENT exam. In addition: shoulder shrug is normal with equal shoulder height noted. Motor exam: Normal bulk, strength and tone is noted. There is no drift, tremor or rebound. Romberg is negative. Reflexes are 2+ throughout. Fine motor skills and coordination: intact with normal finger taps, normal hand movements, normal rapid alternating patting, normal foot taps and normal foot agility.  Cerebellar testing: No dysmetria or intention tremor on finger to nose testing. Heel to shin is unremarkable bilaterally. There is no truncal or gait ataxia.  Sensory exam: intact to light touch in the upper and lower extremities.  Gait, station and balance: She stands easily. No veering to one side is noted. No leaning to one side is noted. Posture is age-appropriate and stance is narrow based. Gait shows normal stride length and normal pace. No problems turning are noted. She turns en bloc. Tandem walk is unremarkable.  Assessment and Plan:   In summary, Martha Pham is a very pleasant 64 year old female with an underlying medical history of hyperlipidemia, hypertension, vitamin D deficiency, chronic kidney disease, low back pain and  obesity, who presents for follow-up consultation of her restless leg symptoms. She has a prior diagnosis of OSA but her most recent sleep study did not show any significant obstructive sleep apnea. She had some REM related sleep disordered breathing but no significant desaturations. She did have PLMS with some associated arousals. She has low back pain and some restless leg symptoms. She tried gabapentin at low dose which I prescribed some 6 months ago and while it helped some, she stopped taking it. She reports no new concerns. Her sleep study from 11/06/2014 showed poor sleep consolidation and significant sleep disruption as well as near absence of slow-wave sleep and very minimal REM sleep. For her REM related OSA she is advised to try to lose weight and sleep off her back. Her exam is stable. She is advised to stay well-hydrated. In addition, I would like to suggest that she continue to use a small dose of gabapentin, 100 mg strength at night as needed to see if we can tone down the leg twitching and RLS symptoms and some of the back achiness at night. She is willing to try it again. I suggested a 54-monthcheckup. I encouraged her to call or email with any interim questions or updates or concerns. I provided her with a new prescription for gabapentin 100 mg strength one pill at bedtime as needed. I answered all her questions today and the patient was in agreement. I spent 25 minutes in total face-to-face time with the patient, more than 50% of which was spent in counseling and coordination of care, reviewing test results, reviewing medication and discussing or reviewing the diagnosis of RLS and PLMD, the prognosis and treatment options.

## 2015-06-05 NOTE — Patient Instructions (Signed)
We will try gabapentin 100 mg again at night as needed for your restless legs and leg twitching at night, it may also help your back pain.  Exam is stable, I can see you back in one year.

## 2015-06-06 DIAGNOSIS — I10 Essential (primary) hypertension: Secondary | ICD-10-CM | POA: Diagnosis not present

## 2015-06-06 DIAGNOSIS — R011 Cardiac murmur, unspecified: Secondary | ICD-10-CM | POA: Diagnosis not present

## 2015-06-06 DIAGNOSIS — Z79899 Other long term (current) drug therapy: Secondary | ICD-10-CM | POA: Diagnosis not present

## 2015-06-06 DIAGNOSIS — E7800 Pure hypercholesterolemia, unspecified: Secondary | ICD-10-CM | POA: Diagnosis not present

## 2015-06-08 DIAGNOSIS — M542 Cervicalgia: Secondary | ICD-10-CM | POA: Diagnosis not present

## 2015-06-08 DIAGNOSIS — M4726 Other spondylosis with radiculopathy, lumbar region: Secondary | ICD-10-CM | POA: Diagnosis not present

## 2015-06-08 DIAGNOSIS — M5417 Radiculopathy, lumbosacral region: Secondary | ICD-10-CM | POA: Diagnosis not present

## 2015-06-08 DIAGNOSIS — M545 Low back pain: Secondary | ICD-10-CM | POA: Diagnosis not present

## 2015-06-08 DIAGNOSIS — M25551 Pain in right hip: Secondary | ICD-10-CM | POA: Diagnosis not present

## 2015-06-08 DIAGNOSIS — M791 Myalgia: Secondary | ICD-10-CM | POA: Diagnosis not present

## 2015-06-08 DIAGNOSIS — M461 Sacroiliitis, not elsewhere classified: Secondary | ICD-10-CM | POA: Diagnosis not present

## 2015-06-19 DIAGNOSIS — H53413 Scotoma involving central area, bilateral: Secondary | ICD-10-CM | POA: Diagnosis not present

## 2015-06-20 DIAGNOSIS — M50321 Other cervical disc degeneration at C4-C5 level: Secondary | ICD-10-CM | POA: Diagnosis not present

## 2015-06-20 DIAGNOSIS — M9901 Segmental and somatic dysfunction of cervical region: Secondary | ICD-10-CM | POA: Diagnosis not present

## 2015-06-20 DIAGNOSIS — M5417 Radiculopathy, lumbosacral region: Secondary | ICD-10-CM | POA: Diagnosis not present

## 2015-06-20 DIAGNOSIS — M791 Myalgia: Secondary | ICD-10-CM | POA: Diagnosis not present

## 2015-06-20 DIAGNOSIS — M461 Sacroiliitis, not elsewhere classified: Secondary | ICD-10-CM | POA: Diagnosis not present

## 2015-06-20 DIAGNOSIS — M545 Low back pain: Secondary | ICD-10-CM | POA: Diagnosis not present

## 2015-06-20 DIAGNOSIS — M6240 Contracture of muscle, unspecified site: Secondary | ICD-10-CM | POA: Diagnosis not present

## 2015-06-20 DIAGNOSIS — M4726 Other spondylosis with radiculopathy, lumbar region: Secondary | ICD-10-CM | POA: Diagnosis not present

## 2015-06-20 DIAGNOSIS — M5137 Other intervertebral disc degeneration, lumbosacral region: Secondary | ICD-10-CM | POA: Diagnosis not present

## 2015-06-20 DIAGNOSIS — M9903 Segmental and somatic dysfunction of lumbar region: Secondary | ICD-10-CM | POA: Diagnosis not present

## 2015-06-22 DIAGNOSIS — M50321 Other cervical disc degeneration at C4-C5 level: Secondary | ICD-10-CM | POA: Diagnosis not present

## 2015-06-22 DIAGNOSIS — M6240 Contracture of muscle, unspecified site: Secondary | ICD-10-CM | POA: Diagnosis not present

## 2015-06-22 DIAGNOSIS — M461 Sacroiliitis, not elsewhere classified: Secondary | ICD-10-CM | POA: Diagnosis not present

## 2015-06-22 DIAGNOSIS — M4726 Other spondylosis with radiculopathy, lumbar region: Secondary | ICD-10-CM | POA: Diagnosis not present

## 2015-06-22 DIAGNOSIS — M5137 Other intervertebral disc degeneration, lumbosacral region: Secondary | ICD-10-CM | POA: Diagnosis not present

## 2015-06-22 DIAGNOSIS — M9901 Segmental and somatic dysfunction of cervical region: Secondary | ICD-10-CM | POA: Diagnosis not present

## 2015-06-22 DIAGNOSIS — M9903 Segmental and somatic dysfunction of lumbar region: Secondary | ICD-10-CM | POA: Diagnosis not present

## 2015-06-22 DIAGNOSIS — M791 Myalgia: Secondary | ICD-10-CM | POA: Diagnosis not present

## 2015-06-22 DIAGNOSIS — M5417 Radiculopathy, lumbosacral region: Secondary | ICD-10-CM | POA: Diagnosis not present

## 2015-06-25 DIAGNOSIS — M545 Low back pain: Secondary | ICD-10-CM | POA: Diagnosis not present

## 2015-06-25 DIAGNOSIS — M5137 Other intervertebral disc degeneration, lumbosacral region: Secondary | ICD-10-CM | POA: Diagnosis not present

## 2015-06-25 DIAGNOSIS — M461 Sacroiliitis, not elsewhere classified: Secondary | ICD-10-CM | POA: Diagnosis not present

## 2015-06-25 DIAGNOSIS — M6240 Contracture of muscle, unspecified site: Secondary | ICD-10-CM | POA: Diagnosis not present

## 2015-06-25 DIAGNOSIS — M9903 Segmental and somatic dysfunction of lumbar region: Secondary | ICD-10-CM | POA: Diagnosis not present

## 2015-06-25 DIAGNOSIS — M4726 Other spondylosis with radiculopathy, lumbar region: Secondary | ICD-10-CM | POA: Diagnosis not present

## 2015-06-25 DIAGNOSIS — M5417 Radiculopathy, lumbosacral region: Secondary | ICD-10-CM | POA: Diagnosis not present

## 2015-06-25 DIAGNOSIS — M50321 Other cervical disc degeneration at C4-C5 level: Secondary | ICD-10-CM | POA: Diagnosis not present

## 2015-06-25 DIAGNOSIS — M9901 Segmental and somatic dysfunction of cervical region: Secondary | ICD-10-CM | POA: Diagnosis not present

## 2015-06-25 DIAGNOSIS — M791 Myalgia: Secondary | ICD-10-CM | POA: Diagnosis not present

## 2015-06-26 ENCOUNTER — Ambulatory Visit
Admission: RE | Admit: 2015-06-26 | Discharge: 2015-06-26 | Disposition: A | Discharge status: Home / Self Care | Payer: Medicare Other | Source: Ambulatory Visit

## 2015-06-26 DIAGNOSIS — Z1231 Encounter for screening mammogram for malignant neoplasm of breast: Secondary | ICD-10-CM

## 2015-06-27 DIAGNOSIS — M9901 Segmental and somatic dysfunction of cervical region: Secondary | ICD-10-CM | POA: Diagnosis not present

## 2015-06-27 DIAGNOSIS — M461 Sacroiliitis, not elsewhere classified: Secondary | ICD-10-CM | POA: Diagnosis not present

## 2015-06-27 DIAGNOSIS — M5417 Radiculopathy, lumbosacral region: Secondary | ICD-10-CM | POA: Diagnosis not present

## 2015-06-27 DIAGNOSIS — M545 Low back pain: Secondary | ICD-10-CM | POA: Diagnosis not present

## 2015-06-27 DIAGNOSIS — M4726 Other spondylosis with radiculopathy, lumbar region: Secondary | ICD-10-CM | POA: Diagnosis not present

## 2015-06-27 DIAGNOSIS — M6240 Contracture of muscle, unspecified site: Secondary | ICD-10-CM | POA: Diagnosis not present

## 2015-06-27 DIAGNOSIS — M9903 Segmental and somatic dysfunction of lumbar region: Secondary | ICD-10-CM | POA: Diagnosis not present

## 2015-06-27 DIAGNOSIS — M791 Myalgia: Secondary | ICD-10-CM | POA: Diagnosis not present

## 2015-06-27 DIAGNOSIS — M5137 Other intervertebral disc degeneration, lumbosacral region: Secondary | ICD-10-CM | POA: Diagnosis not present

## 2015-06-27 DIAGNOSIS — M50321 Other cervical disc degeneration at C4-C5 level: Secondary | ICD-10-CM | POA: Diagnosis not present

## 2015-06-29 DIAGNOSIS — M50321 Other cervical disc degeneration at C4-C5 level: Secondary | ICD-10-CM | POA: Diagnosis not present

## 2015-06-29 DIAGNOSIS — M461 Sacroiliitis, not elsewhere classified: Secondary | ICD-10-CM | POA: Diagnosis not present

## 2015-06-29 DIAGNOSIS — M791 Myalgia: Secondary | ICD-10-CM | POA: Diagnosis not present

## 2015-06-29 DIAGNOSIS — M4726 Other spondylosis with radiculopathy, lumbar region: Secondary | ICD-10-CM | POA: Diagnosis not present

## 2015-06-29 DIAGNOSIS — M5137 Other intervertebral disc degeneration, lumbosacral region: Secondary | ICD-10-CM | POA: Diagnosis not present

## 2015-06-29 DIAGNOSIS — M6240 Contracture of muscle, unspecified site: Secondary | ICD-10-CM | POA: Diagnosis not present

## 2015-06-29 DIAGNOSIS — M9901 Segmental and somatic dysfunction of cervical region: Secondary | ICD-10-CM | POA: Diagnosis not present

## 2015-06-29 DIAGNOSIS — M9903 Segmental and somatic dysfunction of lumbar region: Secondary | ICD-10-CM | POA: Diagnosis not present

## 2015-06-29 DIAGNOSIS — M5417 Radiculopathy, lumbosacral region: Secondary | ICD-10-CM | POA: Diagnosis not present

## 2015-07-02 DIAGNOSIS — M4726 Other spondylosis with radiculopathy, lumbar region: Secondary | ICD-10-CM | POA: Diagnosis not present

## 2015-07-02 DIAGNOSIS — M9903 Segmental and somatic dysfunction of lumbar region: Secondary | ICD-10-CM | POA: Diagnosis not present

## 2015-07-02 DIAGNOSIS — M5417 Radiculopathy, lumbosacral region: Secondary | ICD-10-CM | POA: Diagnosis not present

## 2015-07-02 DIAGNOSIS — M5137 Other intervertebral disc degeneration, lumbosacral region: Secondary | ICD-10-CM | POA: Diagnosis not present

## 2015-07-02 DIAGNOSIS — M6240 Contracture of muscle, unspecified site: Secondary | ICD-10-CM | POA: Diagnosis not present

## 2015-07-02 DIAGNOSIS — M461 Sacroiliitis, not elsewhere classified: Secondary | ICD-10-CM | POA: Diagnosis not present

## 2015-07-02 DIAGNOSIS — M50321 Other cervical disc degeneration at C4-C5 level: Secondary | ICD-10-CM | POA: Diagnosis not present

## 2015-07-02 DIAGNOSIS — M9901 Segmental and somatic dysfunction of cervical region: Secondary | ICD-10-CM | POA: Diagnosis not present

## 2015-07-02 DIAGNOSIS — M545 Low back pain: Secondary | ICD-10-CM | POA: Diagnosis not present

## 2015-07-02 DIAGNOSIS — M791 Myalgia: Secondary | ICD-10-CM | POA: Diagnosis not present

## 2015-07-04 DIAGNOSIS — M5137 Other intervertebral disc degeneration, lumbosacral region: Secondary | ICD-10-CM | POA: Diagnosis not present

## 2015-07-04 DIAGNOSIS — Z79899 Other long term (current) drug therapy: Secondary | ICD-10-CM | POA: Diagnosis not present

## 2015-07-04 DIAGNOSIS — M6240 Contracture of muscle, unspecified site: Secondary | ICD-10-CM | POA: Diagnosis not present

## 2015-07-04 DIAGNOSIS — M461 Sacroiliitis, not elsewhere classified: Secondary | ICD-10-CM | POA: Diagnosis not present

## 2015-07-04 DIAGNOSIS — M9903 Segmental and somatic dysfunction of lumbar region: Secondary | ICD-10-CM | POA: Diagnosis not present

## 2015-07-04 DIAGNOSIS — M50321 Other cervical disc degeneration at C4-C5 level: Secondary | ICD-10-CM | POA: Diagnosis not present

## 2015-07-04 DIAGNOSIS — Z6833 Body mass index (BMI) 33.0-33.9, adult: Secondary | ICD-10-CM | POA: Diagnosis not present

## 2015-07-04 DIAGNOSIS — M4726 Other spondylosis with radiculopathy, lumbar region: Secondary | ICD-10-CM | POA: Diagnosis not present

## 2015-07-04 DIAGNOSIS — M545 Low back pain: Secondary | ICD-10-CM | POA: Diagnosis not present

## 2015-07-04 DIAGNOSIS — I129 Hypertensive chronic kidney disease with stage 1 through stage 4 chronic kidney disease, or unspecified chronic kidney disease: Secondary | ICD-10-CM | POA: Diagnosis not present

## 2015-07-04 DIAGNOSIS — M5417 Radiculopathy, lumbosacral region: Secondary | ICD-10-CM | POA: Diagnosis not present

## 2015-07-04 DIAGNOSIS — M9901 Segmental and somatic dysfunction of cervical region: Secondary | ICD-10-CM | POA: Diagnosis not present

## 2015-07-04 DIAGNOSIS — N182 Chronic kidney disease, stage 2 (mild): Secondary | ICD-10-CM | POA: Diagnosis not present

## 2015-07-04 DIAGNOSIS — M791 Myalgia: Secondary | ICD-10-CM | POA: Diagnosis not present

## 2015-07-06 DIAGNOSIS — M9901 Segmental and somatic dysfunction of cervical region: Secondary | ICD-10-CM | POA: Diagnosis not present

## 2015-07-06 DIAGNOSIS — M461 Sacroiliitis, not elsewhere classified: Secondary | ICD-10-CM | POA: Diagnosis not present

## 2015-07-06 DIAGNOSIS — I129 Hypertensive chronic kidney disease with stage 1 through stage 4 chronic kidney disease, or unspecified chronic kidney disease: Secondary | ICD-10-CM | POA: Diagnosis not present

## 2015-07-06 DIAGNOSIS — M6240 Contracture of muscle, unspecified site: Secondary | ICD-10-CM | POA: Diagnosis not present

## 2015-07-06 DIAGNOSIS — M4726 Other spondylosis with radiculopathy, lumbar region: Secondary | ICD-10-CM | POA: Diagnosis not present

## 2015-07-06 DIAGNOSIS — N182 Chronic kidney disease, stage 2 (mild): Secondary | ICD-10-CM | POA: Diagnosis not present

## 2015-07-06 DIAGNOSIS — M5417 Radiculopathy, lumbosacral region: Secondary | ICD-10-CM | POA: Diagnosis not present

## 2015-07-06 DIAGNOSIS — M50321 Other cervical disc degeneration at C4-C5 level: Secondary | ICD-10-CM | POA: Diagnosis not present

## 2015-07-06 DIAGNOSIS — M5137 Other intervertebral disc degeneration, lumbosacral region: Secondary | ICD-10-CM | POA: Diagnosis not present

## 2015-07-06 DIAGNOSIS — M791 Myalgia: Secondary | ICD-10-CM | POA: Diagnosis not present

## 2015-07-06 DIAGNOSIS — M9903 Segmental and somatic dysfunction of lumbar region: Secondary | ICD-10-CM | POA: Diagnosis not present

## 2015-07-24 DIAGNOSIS — M5137 Other intervertebral disc degeneration, lumbosacral region: Secondary | ICD-10-CM | POA: Diagnosis not present

## 2015-07-24 DIAGNOSIS — M5417 Radiculopathy, lumbosacral region: Secondary | ICD-10-CM | POA: Diagnosis not present

## 2015-07-24 DIAGNOSIS — M6240 Contracture of muscle, unspecified site: Secondary | ICD-10-CM | POA: Diagnosis not present

## 2015-07-24 DIAGNOSIS — M9901 Segmental and somatic dysfunction of cervical region: Secondary | ICD-10-CM | POA: Diagnosis not present

## 2015-07-24 DIAGNOSIS — M9903 Segmental and somatic dysfunction of lumbar region: Secondary | ICD-10-CM | POA: Diagnosis not present

## 2015-07-24 DIAGNOSIS — M4726 Other spondylosis with radiculopathy, lumbar region: Secondary | ICD-10-CM | POA: Diagnosis not present

## 2015-07-24 DIAGNOSIS — M461 Sacroiliitis, not elsewhere classified: Secondary | ICD-10-CM | POA: Diagnosis not present

## 2015-07-24 DIAGNOSIS — M50321 Other cervical disc degeneration at C4-C5 level: Secondary | ICD-10-CM | POA: Diagnosis not present

## 2015-07-24 DIAGNOSIS — M791 Myalgia: Secondary | ICD-10-CM | POA: Diagnosis not present

## 2015-07-27 DIAGNOSIS — M6240 Contracture of muscle, unspecified site: Secondary | ICD-10-CM | POA: Diagnosis not present

## 2015-07-27 DIAGNOSIS — M9903 Segmental and somatic dysfunction of lumbar region: Secondary | ICD-10-CM | POA: Diagnosis not present

## 2015-07-27 DIAGNOSIS — M5137 Other intervertebral disc degeneration, lumbosacral region: Secondary | ICD-10-CM | POA: Diagnosis not present

## 2015-07-27 DIAGNOSIS — M50321 Other cervical disc degeneration at C4-C5 level: Secondary | ICD-10-CM | POA: Diagnosis not present

## 2015-07-27 DIAGNOSIS — M545 Low back pain: Secondary | ICD-10-CM | POA: Diagnosis not present

## 2015-07-27 DIAGNOSIS — M791 Myalgia: Secondary | ICD-10-CM | POA: Diagnosis not present

## 2015-07-27 DIAGNOSIS — M461 Sacroiliitis, not elsewhere classified: Secondary | ICD-10-CM | POA: Diagnosis not present

## 2015-07-27 DIAGNOSIS — M9901 Segmental and somatic dysfunction of cervical region: Secondary | ICD-10-CM | POA: Diagnosis not present

## 2015-07-27 DIAGNOSIS — M4726 Other spondylosis with radiculopathy, lumbar region: Secondary | ICD-10-CM | POA: Diagnosis not present

## 2015-07-27 DIAGNOSIS — M5417 Radiculopathy, lumbosacral region: Secondary | ICD-10-CM | POA: Diagnosis not present

## 2015-07-30 DIAGNOSIS — M5137 Other intervertebral disc degeneration, lumbosacral region: Secondary | ICD-10-CM | POA: Diagnosis not present

## 2015-07-30 DIAGNOSIS — M9901 Segmental and somatic dysfunction of cervical region: Secondary | ICD-10-CM | POA: Diagnosis not present

## 2015-07-30 DIAGNOSIS — M791 Myalgia: Secondary | ICD-10-CM | POA: Diagnosis not present

## 2015-07-30 DIAGNOSIS — M9903 Segmental and somatic dysfunction of lumbar region: Secondary | ICD-10-CM | POA: Diagnosis not present

## 2015-07-30 DIAGNOSIS — M461 Sacroiliitis, not elsewhere classified: Secondary | ICD-10-CM | POA: Diagnosis not present

## 2015-07-30 DIAGNOSIS — M6240 Contracture of muscle, unspecified site: Secondary | ICD-10-CM | POA: Diagnosis not present

## 2015-07-30 DIAGNOSIS — M50221 Other cervical disc displacement at C4-C5 level: Secondary | ICD-10-CM | POA: Diagnosis not present

## 2015-07-30 DIAGNOSIS — M50321 Other cervical disc degeneration at C4-C5 level: Secondary | ICD-10-CM | POA: Diagnosis not present

## 2015-08-03 DIAGNOSIS — M545 Low back pain: Secondary | ICD-10-CM | POA: Diagnosis not present

## 2015-08-03 DIAGNOSIS — M9901 Segmental and somatic dysfunction of cervical region: Secondary | ICD-10-CM | POA: Diagnosis not present

## 2015-08-03 DIAGNOSIS — M5137 Other intervertebral disc degeneration, lumbosacral region: Secondary | ICD-10-CM | POA: Diagnosis not present

## 2015-08-03 DIAGNOSIS — M50321 Other cervical disc degeneration at C4-C5 level: Secondary | ICD-10-CM | POA: Diagnosis not present

## 2015-08-03 DIAGNOSIS — M461 Sacroiliitis, not elsewhere classified: Secondary | ICD-10-CM | POA: Diagnosis not present

## 2015-08-03 DIAGNOSIS — M6240 Contracture of muscle, unspecified site: Secondary | ICD-10-CM | POA: Diagnosis not present

## 2015-08-03 DIAGNOSIS — M791 Myalgia: Secondary | ICD-10-CM | POA: Diagnosis not present

## 2015-08-03 DIAGNOSIS — M9903 Segmental and somatic dysfunction of lumbar region: Secondary | ICD-10-CM | POA: Diagnosis not present

## 2015-08-03 DIAGNOSIS — M50221 Other cervical disc displacement at C4-C5 level: Secondary | ICD-10-CM | POA: Diagnosis not present

## 2015-08-08 DIAGNOSIS — M5417 Radiculopathy, lumbosacral region: Secondary | ICD-10-CM | POA: Diagnosis not present

## 2015-08-08 DIAGNOSIS — M50321 Other cervical disc degeneration at C4-C5 level: Secondary | ICD-10-CM | POA: Diagnosis not present

## 2015-08-08 DIAGNOSIS — M9901 Segmental and somatic dysfunction of cervical region: Secondary | ICD-10-CM | POA: Diagnosis not present

## 2015-08-08 DIAGNOSIS — M6240 Contracture of muscle, unspecified site: Secondary | ICD-10-CM | POA: Diagnosis not present

## 2015-08-08 DIAGNOSIS — M5137 Other intervertebral disc degeneration, lumbosacral region: Secondary | ICD-10-CM | POA: Diagnosis not present

## 2015-08-08 DIAGNOSIS — M9903 Segmental and somatic dysfunction of lumbar region: Secondary | ICD-10-CM | POA: Diagnosis not present

## 2015-08-10 DIAGNOSIS — M50321 Other cervical disc degeneration at C4-C5 level: Secondary | ICD-10-CM | POA: Diagnosis not present

## 2015-08-10 DIAGNOSIS — M461 Sacroiliitis, not elsewhere classified: Secondary | ICD-10-CM | POA: Diagnosis not present

## 2015-08-10 DIAGNOSIS — M791 Myalgia: Secondary | ICD-10-CM | POA: Diagnosis not present

## 2015-08-10 DIAGNOSIS — M5137 Other intervertebral disc degeneration, lumbosacral region: Secondary | ICD-10-CM | POA: Diagnosis not present

## 2015-08-10 DIAGNOSIS — M9903 Segmental and somatic dysfunction of lumbar region: Secondary | ICD-10-CM | POA: Diagnosis not present

## 2015-08-10 DIAGNOSIS — M9901 Segmental and somatic dysfunction of cervical region: Secondary | ICD-10-CM | POA: Diagnosis not present

## 2015-08-10 DIAGNOSIS — M6240 Contracture of muscle, unspecified site: Secondary | ICD-10-CM | POA: Diagnosis not present

## 2015-08-10 DIAGNOSIS — M50221 Other cervical disc displacement at C4-C5 level: Secondary | ICD-10-CM | POA: Diagnosis not present

## 2015-08-22 DIAGNOSIS — M50321 Other cervical disc degeneration at C4-C5 level: Secondary | ICD-10-CM | POA: Diagnosis not present

## 2015-08-22 DIAGNOSIS — M9901 Segmental and somatic dysfunction of cervical region: Secondary | ICD-10-CM | POA: Diagnosis not present

## 2015-08-22 DIAGNOSIS — M9903 Segmental and somatic dysfunction of lumbar region: Secondary | ICD-10-CM | POA: Diagnosis not present

## 2015-08-22 DIAGNOSIS — M6240 Contracture of muscle, unspecified site: Secondary | ICD-10-CM | POA: Diagnosis not present

## 2015-08-22 DIAGNOSIS — M5137 Other intervertebral disc degeneration, lumbosacral region: Secondary | ICD-10-CM | POA: Diagnosis not present

## 2015-08-24 DIAGNOSIS — M50321 Other cervical disc degeneration at C4-C5 level: Secondary | ICD-10-CM | POA: Diagnosis not present

## 2015-08-24 DIAGNOSIS — M791 Myalgia: Secondary | ICD-10-CM | POA: Diagnosis not present

## 2015-08-24 DIAGNOSIS — M50221 Other cervical disc displacement at C4-C5 level: Secondary | ICD-10-CM | POA: Diagnosis not present

## 2015-08-24 DIAGNOSIS — M542 Cervicalgia: Secondary | ICD-10-CM | POA: Diagnosis not present

## 2015-08-24 DIAGNOSIS — M5137 Other intervertebral disc degeneration, lumbosacral region: Secondary | ICD-10-CM | POA: Diagnosis not present

## 2015-08-24 DIAGNOSIS — M9903 Segmental and somatic dysfunction of lumbar region: Secondary | ICD-10-CM | POA: Diagnosis not present

## 2015-08-24 DIAGNOSIS — M9901 Segmental and somatic dysfunction of cervical region: Secondary | ICD-10-CM | POA: Diagnosis not present

## 2015-08-24 DIAGNOSIS — M461 Sacroiliitis, not elsewhere classified: Secondary | ICD-10-CM | POA: Diagnosis not present

## 2015-08-24 DIAGNOSIS — M6240 Contracture of muscle, unspecified site: Secondary | ICD-10-CM | POA: Diagnosis not present

## 2015-08-27 DIAGNOSIS — M6240 Contracture of muscle, unspecified site: Secondary | ICD-10-CM | POA: Diagnosis not present

## 2015-08-27 DIAGNOSIS — M791 Myalgia: Secondary | ICD-10-CM | POA: Diagnosis not present

## 2015-08-27 DIAGNOSIS — M542 Cervicalgia: Secondary | ICD-10-CM | POA: Diagnosis not present

## 2015-08-27 DIAGNOSIS — M9901 Segmental and somatic dysfunction of cervical region: Secondary | ICD-10-CM | POA: Diagnosis not present

## 2015-08-27 DIAGNOSIS — M461 Sacroiliitis, not elsewhere classified: Secondary | ICD-10-CM | POA: Diagnosis not present

## 2015-08-27 DIAGNOSIS — M50321 Other cervical disc degeneration at C4-C5 level: Secondary | ICD-10-CM | POA: Diagnosis not present

## 2015-08-27 DIAGNOSIS — M50221 Other cervical disc displacement at C4-C5 level: Secondary | ICD-10-CM | POA: Diagnosis not present

## 2015-08-27 DIAGNOSIS — M5137 Other intervertebral disc degeneration, lumbosacral region: Secondary | ICD-10-CM | POA: Diagnosis not present

## 2015-08-27 DIAGNOSIS — M9903 Segmental and somatic dysfunction of lumbar region: Secondary | ICD-10-CM | POA: Diagnosis not present

## 2015-08-29 DIAGNOSIS — M50221 Other cervical disc displacement at C4-C5 level: Secondary | ICD-10-CM | POA: Diagnosis not present

## 2015-08-29 DIAGNOSIS — M542 Cervicalgia: Secondary | ICD-10-CM | POA: Diagnosis not present

## 2015-08-29 DIAGNOSIS — M461 Sacroiliitis, not elsewhere classified: Secondary | ICD-10-CM | POA: Diagnosis not present

## 2015-08-29 DIAGNOSIS — M6240 Contracture of muscle, unspecified site: Secondary | ICD-10-CM | POA: Diagnosis not present

## 2015-08-29 DIAGNOSIS — M791 Myalgia: Secondary | ICD-10-CM | POA: Diagnosis not present

## 2015-08-29 DIAGNOSIS — M50321 Other cervical disc degeneration at C4-C5 level: Secondary | ICD-10-CM | POA: Diagnosis not present

## 2015-08-29 DIAGNOSIS — M9903 Segmental and somatic dysfunction of lumbar region: Secondary | ICD-10-CM | POA: Diagnosis not present

## 2015-08-29 DIAGNOSIS — M9901 Segmental and somatic dysfunction of cervical region: Secondary | ICD-10-CM | POA: Diagnosis not present

## 2015-08-29 DIAGNOSIS — M5137 Other intervertebral disc degeneration, lumbosacral region: Secondary | ICD-10-CM | POA: Diagnosis not present

## 2015-09-03 DIAGNOSIS — M542 Cervicalgia: Secondary | ICD-10-CM | POA: Diagnosis not present

## 2015-09-03 DIAGNOSIS — M6240 Contracture of muscle, unspecified site: Secondary | ICD-10-CM | POA: Diagnosis not present

## 2015-09-03 DIAGNOSIS — M50321 Other cervical disc degeneration at C4-C5 level: Secondary | ICD-10-CM | POA: Diagnosis not present

## 2015-09-03 DIAGNOSIS — M461 Sacroiliitis, not elsewhere classified: Secondary | ICD-10-CM | POA: Diagnosis not present

## 2015-09-03 DIAGNOSIS — M5137 Other intervertebral disc degeneration, lumbosacral region: Secondary | ICD-10-CM | POA: Diagnosis not present

## 2015-09-03 DIAGNOSIS — M9901 Segmental and somatic dysfunction of cervical region: Secondary | ICD-10-CM | POA: Diagnosis not present

## 2015-09-03 DIAGNOSIS — M9903 Segmental and somatic dysfunction of lumbar region: Secondary | ICD-10-CM | POA: Diagnosis not present

## 2015-09-03 DIAGNOSIS — M791 Myalgia: Secondary | ICD-10-CM | POA: Diagnosis not present

## 2015-09-03 DIAGNOSIS — M50221 Other cervical disc displacement at C4-C5 level: Secondary | ICD-10-CM | POA: Diagnosis not present

## 2015-09-05 DIAGNOSIS — M791 Myalgia: Secondary | ICD-10-CM | POA: Diagnosis not present

## 2015-09-05 DIAGNOSIS — M5031 Other cervical disc degeneration,  high cervical region: Secondary | ICD-10-CM | POA: Diagnosis not present

## 2015-09-05 DIAGNOSIS — M6281 Muscle weakness (generalized): Secondary | ICD-10-CM | POA: Diagnosis not present

## 2015-09-05 DIAGNOSIS — M6240 Contracture of muscle, unspecified site: Secondary | ICD-10-CM | POA: Diagnosis not present

## 2015-09-05 DIAGNOSIS — M50321 Other cervical disc degeneration at C4-C5 level: Secondary | ICD-10-CM | POA: Diagnosis not present

## 2015-09-05 DIAGNOSIS — M542 Cervicalgia: Secondary | ICD-10-CM | POA: Diagnosis not present

## 2015-09-05 DIAGNOSIS — M9901 Segmental and somatic dysfunction of cervical region: Secondary | ICD-10-CM | POA: Diagnosis not present

## 2015-09-05 DIAGNOSIS — M5021 Other cervical disc displacement,  high cervical region: Secondary | ICD-10-CM | POA: Diagnosis not present

## 2015-09-05 DIAGNOSIS — M5137 Other intervertebral disc degeneration, lumbosacral region: Secondary | ICD-10-CM | POA: Diagnosis not present

## 2015-09-05 DIAGNOSIS — M9903 Segmental and somatic dysfunction of lumbar region: Secondary | ICD-10-CM | POA: Diagnosis not present

## 2015-09-07 DIAGNOSIS — M50321 Other cervical disc degeneration at C4-C5 level: Secondary | ICD-10-CM | POA: Diagnosis not present

## 2015-09-07 DIAGNOSIS — M6240 Contracture of muscle, unspecified site: Secondary | ICD-10-CM | POA: Diagnosis not present

## 2015-09-07 DIAGNOSIS — M9903 Segmental and somatic dysfunction of lumbar region: Secondary | ICD-10-CM | POA: Diagnosis not present

## 2015-09-07 DIAGNOSIS — M9901 Segmental and somatic dysfunction of cervical region: Secondary | ICD-10-CM | POA: Diagnosis not present

## 2015-09-07 DIAGNOSIS — M5137 Other intervertebral disc degeneration, lumbosacral region: Secondary | ICD-10-CM | POA: Diagnosis not present

## 2015-09-10 DIAGNOSIS — M6240 Contracture of muscle, unspecified site: Secondary | ICD-10-CM | POA: Diagnosis not present

## 2015-09-10 DIAGNOSIS — M9903 Segmental and somatic dysfunction of lumbar region: Secondary | ICD-10-CM | POA: Diagnosis not present

## 2015-09-10 DIAGNOSIS — M5021 Other cervical disc displacement,  high cervical region: Secondary | ICD-10-CM | POA: Diagnosis not present

## 2015-09-10 DIAGNOSIS — M50321 Other cervical disc degeneration at C4-C5 level: Secondary | ICD-10-CM | POA: Diagnosis not present

## 2015-09-10 DIAGNOSIS — M6281 Muscle weakness (generalized): Secondary | ICD-10-CM | POA: Diagnosis not present

## 2015-09-10 DIAGNOSIS — M5031 Other cervical disc degeneration,  high cervical region: Secondary | ICD-10-CM | POA: Diagnosis not present

## 2015-09-10 DIAGNOSIS — M791 Myalgia: Secondary | ICD-10-CM | POA: Diagnosis not present

## 2015-09-10 DIAGNOSIS — M5137 Other intervertebral disc degeneration, lumbosacral region: Secondary | ICD-10-CM | POA: Diagnosis not present

## 2015-09-10 DIAGNOSIS — M542 Cervicalgia: Secondary | ICD-10-CM | POA: Diagnosis not present

## 2015-09-10 DIAGNOSIS — M9901 Segmental and somatic dysfunction of cervical region: Secondary | ICD-10-CM | POA: Diagnosis not present

## 2015-09-12 DIAGNOSIS — M5137 Other intervertebral disc degeneration, lumbosacral region: Secondary | ICD-10-CM | POA: Diagnosis not present

## 2015-09-12 DIAGNOSIS — M5031 Other cervical disc degeneration,  high cervical region: Secondary | ICD-10-CM | POA: Diagnosis not present

## 2015-09-12 DIAGNOSIS — M791 Myalgia: Secondary | ICD-10-CM | POA: Diagnosis not present

## 2015-09-12 DIAGNOSIS — M6240 Contracture of muscle, unspecified site: Secondary | ICD-10-CM | POA: Diagnosis not present

## 2015-09-12 DIAGNOSIS — M542 Cervicalgia: Secondary | ICD-10-CM | POA: Diagnosis not present

## 2015-09-12 DIAGNOSIS — M9901 Segmental and somatic dysfunction of cervical region: Secondary | ICD-10-CM | POA: Diagnosis not present

## 2015-09-12 DIAGNOSIS — M50321 Other cervical disc degeneration at C4-C5 level: Secondary | ICD-10-CM | POA: Diagnosis not present

## 2015-09-12 DIAGNOSIS — M9903 Segmental and somatic dysfunction of lumbar region: Secondary | ICD-10-CM | POA: Diagnosis not present

## 2015-09-12 DIAGNOSIS — M5021 Other cervical disc displacement,  high cervical region: Secondary | ICD-10-CM | POA: Diagnosis not present

## 2015-09-12 DIAGNOSIS — M6281 Muscle weakness (generalized): Secondary | ICD-10-CM | POA: Diagnosis not present

## 2015-09-17 DIAGNOSIS — M9901 Segmental and somatic dysfunction of cervical region: Secondary | ICD-10-CM | POA: Diagnosis not present

## 2015-09-17 DIAGNOSIS — M791 Myalgia: Secondary | ICD-10-CM | POA: Diagnosis not present

## 2015-09-17 DIAGNOSIS — M9903 Segmental and somatic dysfunction of lumbar region: Secondary | ICD-10-CM | POA: Diagnosis not present

## 2015-09-17 DIAGNOSIS — M5031 Other cervical disc degeneration,  high cervical region: Secondary | ICD-10-CM | POA: Diagnosis not present

## 2015-09-17 DIAGNOSIS — M5137 Other intervertebral disc degeneration, lumbosacral region: Secondary | ICD-10-CM | POA: Diagnosis not present

## 2015-09-17 DIAGNOSIS — M6240 Contracture of muscle, unspecified site: Secondary | ICD-10-CM | POA: Diagnosis not present

## 2015-09-17 DIAGNOSIS — M542 Cervicalgia: Secondary | ICD-10-CM | POA: Diagnosis not present

## 2015-09-17 DIAGNOSIS — M6281 Muscle weakness (generalized): Secondary | ICD-10-CM | POA: Diagnosis not present

## 2015-09-17 DIAGNOSIS — M50321 Other cervical disc degeneration at C4-C5 level: Secondary | ICD-10-CM | POA: Diagnosis not present

## 2015-09-17 DIAGNOSIS — M5021 Other cervical disc displacement,  high cervical region: Secondary | ICD-10-CM | POA: Diagnosis not present

## 2015-09-18 DIAGNOSIS — N72 Inflammatory disease of cervix uteri: Secondary | ICD-10-CM | POA: Diagnosis not present

## 2015-09-18 DIAGNOSIS — C50912 Malignant neoplasm of unspecified site of left female breast: Secondary | ICD-10-CM | POA: Diagnosis not present

## 2015-09-18 DIAGNOSIS — Z01411 Encounter for gynecological examination (general) (routine) with abnormal findings: Secondary | ICD-10-CM | POA: Diagnosis not present

## 2015-09-18 DIAGNOSIS — Z124 Encounter for screening for malignant neoplasm of cervix: Secondary | ICD-10-CM | POA: Diagnosis not present

## 2015-09-18 DIAGNOSIS — Z6833 Body mass index (BMI) 33.0-33.9, adult: Secondary | ICD-10-CM | POA: Diagnosis not present

## 2015-10-02 ENCOUNTER — Ambulatory Visit (HOSPITAL_BASED_OUTPATIENT_CLINIC_OR_DEPARTMENT_OTHER): Payer: Medicare Other | Admitting: Hematology and Oncology

## 2015-10-02 ENCOUNTER — Telehealth: Payer: Self-pay | Admitting: Hematology and Oncology

## 2015-10-02 ENCOUNTER — Encounter: Payer: Self-pay | Admitting: Hematology and Oncology

## 2015-10-02 VITALS — BP 133/72 | HR 78 | Temp 98.2°F | Resp 18 | Ht 61.0 in | Wt 163.6 lb

## 2015-10-02 DIAGNOSIS — C50212 Malignant neoplasm of upper-inner quadrant of left female breast: Secondary | ICD-10-CM

## 2015-10-02 NOTE — Progress Notes (Signed)
Patient Care Team: Glendale Chard, MD as PCP - General (Internal Medicine)  DIAGNOSIS: Breast cancer of upper-inner quadrant of left female breast [facility])   Staging form: Breast, AJCC 7th Edition     Clinical: Stage IIA (T2, N0, cM0) - Signed by Thea Silversmith, MD on 06/12/2011     Pathologic: Stage IIA (T2, N0, cM0) - Signed by Rulon Eisenmenger, MD on 03/28/2014   SUMMARY OF ONCOLOGIC HISTORY:   Breast cancer of upper-inner quadrant of left female breast ([place])   06/12/2011 Initial Diagnosis Cancer of upper-inner quadrant of female breast: Grade 3 IDC ER negative PR negative HER-2 negative, MRI 2.7 x 2.3 x 1 cm mass   06/24/2011 Surgery Left breast lumpectomy 2.2 cm IDC grade 3 with high-grade DCIS triple negative, for positive margins patient had reexcision: 2 lymph nodes negative ER/PR negative HER-2 negative ratio 0.97 Ki-67 75%   09/02/2011 Surgery Left mastectomy   10/01/2011 - 12/03/2011 Chemotherapy Adjuvant Taxotere and Cytoxan x4 cycles   07/05/2012 Surgery Breast reconstruction on the left breast with reduction of right breast    CHIEF COMPLIANT:  Follow-up of triple negative breast cancer  INTERVAL HISTORY: Martha Pham is a  65 year old above-mentioned history of left breast cancer triple negative disease currently on surveillance. She reports no new problems or concerns. She denies any lumps or nodules in the breast. She is excited about traveling to [place] to stay 3 months with her daughter.  REVIEW OF SYSTEMS:   Constitutional: Denies fevers, chills or abnormal weight loss Eyes: Denies blurriness of vision Ears, nose, mouth, throat, and face: Denies mucositis or sore throat Respiratory: Denies cough, dyspnea or wheezes Cardiovascular: Denies palpitation, chest discomfort Gastrointestinal:  Denies nausea, heartburn or change in bowel habits Skin: Denies abnormal skin rashes Lymphatics: Denies new lymphadenopathy or easy bruising Neurological:Denies numbness, tingling or  new weaknesses Behavioral/Psych: Mood is stable, no new changes  Extremities: No lower extremity edema Breast:  denies any pain or lumps or nodules in either breasts All other systems were reviewed with the patient and are negative.  I have reviewed the past medical history, past surgical history, social history and family history with the patient and they are unchanged from previous note.  ALLERGIES:  is allergic to dust mite extract and other.  MEDICATIONS:  Current Outpatient Prescriptions  Medication Sig Dispense Refill  . acetaminophen (TYLENOL) 500 MG tablet Take 500 mg by mouth every 6 (six) hours as needed.    Marland Kitchen atorvastatin (LIPITOR) 10 MG tablet Take 10 mg by mouth daily.      . calcium carbonate (OS-CAL) 600 MG TABS Take 600 mg by mouth 2 (two) times daily with a meal.      . Carboxymethylcellulose Sodium (REFRESH OP) Apply 1 drop to eye 2 (two) times daily as needed. For dry eyes    . diazepam (VALIUM) 2 MG tablet     . fish oil-omega-3 fatty acids 1000 MG capsule Take 2 g by mouth daily.    Marland Kitchen gabapentin (NEURONTIN) 100 MG capsule Take 1 pill at bedtime as needed for leg pain. 30 capsule 5  . loratadine (CLARITIN) 10 MG tablet Take 10 mg by mouth daily.    Marland Kitchen losartan-hydrochlorothiazide (HYZAAR) 50-12.5 MG per tablet 1 tablet daily.     . [DISCONTINUED] MICARDIS HCT 40-12.5 MG per tablet Take 1 tablet by mouth Daily.     No current facility-administered medications for this visit.    PHYSICAL EXAMINATION: ECOG PERFORMANCE STATUS: 0 - Asymptomatic  Filed Vitals:   10/02/15 1135  BP: 133/72  Pulse: 78  Temp: 98.2 F (36.8 C)  Resp: 18   Filed Weights   10/02/15 1135  Weight: 163 lb 9.6 oz (74.208 kg)    GENERAL:alert, no distress and comfortable SKIN: skin color, texture, turgor are normal, no rashes or significant lesions EYES: normal, Conjunctiva are pink and non-injected, sclera clear OROPHARYNX:no exudate, no erythema and lips, buccal mucosa, and tongue  normal  NECK: supple, thyroid normal size, non-tender, without nodularity LYMPH:  no palpable lymphadenopathy in the cervical, axillary or inguinal LUNGS: clear to auscultation and percussion with normal breathing effort HEART: regular rate & rhythm and no murmurs and no lower extremity edema ABDOMEN:abdomen soft, non-tender and normal bowel sounds MUSCULOSKELETAL:no cyanosis of digits and no clubbing  NEURO: alert & oriented x 3 with fluent speech, no focal motor/sensory deficits EXTREMITIES: No lower extremity edema  LABORATORY DATA:  I have reviewed the data as listed   Chemistry      Component Value Date/Time   NA 142 04/03/2015 1109   NA 139 09/22/2012 1430   K 3.1* 04/03/2015 1109   K 3.6 09/22/2012 1430   CL 105 09/22/2012 1430   CL 106 08/17/2012 1351   CO2 24 04/03/2015 1109   CO2 26 09/22/2012 1430   BUN 7.9 04/03/2015 1109   BUN 13 09/22/2012 1430   CREATININE 0.8 04/03/2015 1109   CREATININE 0.71 09/22/2012 1430      Component Value Date/Time   CALCIUM 9.7 04/03/2015 1109   CALCIUM 9.5 09/22/2012 1430   ALKPHOS 74 04/03/2015 1109   ALKPHOS 64 02/11/2012 1315   AST 15 04/03/2015 1109   AST 17 02/11/2012 1315   ALT 18 04/03/2015 1109   ALT 16 02/11/2012 1315   BILITOT 0.64 04/03/2015 1109   BILITOT 0.3 02/11/2012 1315       Lab Results  Component Value Date   WBC 4.2 04/03/2015   HGB 12.9 04/03/2015   HCT 39.1 04/03/2015   MCV 97.2 04/03/2015   PLT 176 04/03/2015   NEUTROABS 2.1 04/03/2015   ASSESSMENT & PLAN:  Breast cancer of upper-inner quadrant of left female breast Left breast cancer treated with lumpectomy 06/24/2011, later underwent mastectomy 09/02/2011, 2.2 cm grade 3 with high-grade DCIS, triple negative, 2 lymph nodes negative, Ki-67 75%, adjuvant chemotherapy with Taxotere and Cytoxan completed 12/03/2011 followed by breast reconstruction 07/05/2012  Breast cancer surveillance: 1. Breast exam 10/02/2015 is normal 2. Mammogram 05/18/2014  is normal  Bone pain: bone scan is normal in March 2015 Tennis elbow: much improved  patient plans to go to [place] for 3 months and enjoyed the sites where her daughter currently resides. Return to clinic in 1 year for follow-up In survivorship clinic   Orders Placed This Encounter  Procedures  . Amb Referral to Survivorship Long term    Referral Priority:  Routine    Referral Type:  Consultation    Number of Visits Requested:  1   The patient has a good understanding of the overall plan. she agrees with it. she will call with any problems that may develop before the next visit here.   Rulon Eisenmenger, MD 10/02/2015

## 2015-10-02 NOTE — Assessment & Plan Note (Signed)
Left breast cancer treated with lumpectomy 06/24/2011, later underwent mastectomy 09/02/2011, 2.2 cm grade 3 with high-grade DCIS, triple negative, 2 lymph nodes negative, Ki-67 75%, adjuvant chemotherapy with Taxotere and Cytoxan completed 12/03/2011 followed by breast reconstruction 07/05/2012  Breast cancer surveillance: 1. Breast exam 10/02/2015 is normal 2. Mammogram 05/18/2014 is normal  Bone pain: bone scan is normal in March 2015 Tennis elbow: much improved Return to clinic in 1 year for follow-up

## 2015-10-02 NOTE — Telephone Encounter (Signed)
appt made and avs printed °

## 2015-12-03 ENCOUNTER — Other Ambulatory Visit: Payer: Self-pay | Admitting: Gastroenterology

## 2015-12-03 DIAGNOSIS — Z86010 Personal history of colonic polyps: Secondary | ICD-10-CM | POA: Diagnosis not present

## 2015-12-03 DIAGNOSIS — D126 Benign neoplasm of colon, unspecified: Secondary | ICD-10-CM | POA: Diagnosis not present

## 2015-12-03 DIAGNOSIS — D122 Benign neoplasm of ascending colon: Secondary | ICD-10-CM | POA: Diagnosis not present

## 2016-01-28 DIAGNOSIS — Z0000 Encounter for general adult medical examination without abnormal findings: Secondary | ICD-10-CM | POA: Diagnosis not present

## 2016-01-28 DIAGNOSIS — N182 Chronic kidney disease, stage 2 (mild): Secondary | ICD-10-CM | POA: Diagnosis not present

## 2016-01-28 DIAGNOSIS — R0982 Postnasal drip: Secondary | ICD-10-CM | POA: Diagnosis not present

## 2016-01-28 DIAGNOSIS — I129 Hypertensive chronic kidney disease with stage 1 through stage 4 chronic kidney disease, or unspecified chronic kidney disease: Secondary | ICD-10-CM | POA: Diagnosis not present

## 2016-01-28 DIAGNOSIS — E559 Vitamin D deficiency, unspecified: Secondary | ICD-10-CM | POA: Diagnosis not present

## 2016-04-30 ENCOUNTER — Encounter: Payer: Self-pay | Admitting: Neurology

## 2016-05-05 DIAGNOSIS — R002 Palpitations: Secondary | ICD-10-CM | POA: Diagnosis not present

## 2016-05-05 DIAGNOSIS — I340 Nonrheumatic mitral (valve) insufficiency: Secondary | ICD-10-CM | POA: Diagnosis not present

## 2016-05-05 DIAGNOSIS — I10 Essential (primary) hypertension: Secondary | ICD-10-CM | POA: Diagnosis not present

## 2016-05-05 DIAGNOSIS — E7800 Pure hypercholesterolemia, unspecified: Secondary | ICD-10-CM | POA: Diagnosis not present

## 2016-05-19 ENCOUNTER — Other Ambulatory Visit: Payer: Self-pay | Admitting: Internal Medicine

## 2016-05-19 DIAGNOSIS — Z1231 Encounter for screening mammogram for malignant neoplasm of breast: Secondary | ICD-10-CM

## 2016-06-04 ENCOUNTER — Ambulatory Visit: Payer: Medicare Other | Admitting: Neurology

## 2016-06-19 ENCOUNTER — Ambulatory Visit (INDEPENDENT_AMBULATORY_CARE_PROVIDER_SITE_OTHER): Payer: Medicare Other | Admitting: Neurology

## 2016-06-19 ENCOUNTER — Encounter: Payer: Self-pay | Admitting: Neurology

## 2016-06-19 VITALS — BP 130/68 | HR 70 | Resp 16 | Ht 61.0 in | Wt 171.0 lb

## 2016-06-19 DIAGNOSIS — G2581 Restless legs syndrome: Secondary | ICD-10-CM

## 2016-06-19 DIAGNOSIS — G4761 Periodic limb movement disorder: Secondary | ICD-10-CM

## 2016-06-19 NOTE — Progress Notes (Signed)
Subjective:    Patient ID: Martha Pham is a 65 y.o. female.  HPI     Interim history:   Ms. Rittenhouse is a 65 year old right-handed woman with an underlying medical history of hyperlipidemia, hypertension, vitamin D deficiency, chronic kidney disease, breast cancer in 2012, s/p lumpectomy x 2 and then L mastectomy on 09/01/11, s/p chemo, low back pain and obesity, who presents for follow-up consultation of her restless leg syndrome, on gabapentin. The patient is unaccompanied today. I last saw her on 06/05/2015, at which time she reported doing fairly well. She had noticed improvement of her restless leg symptoms on gabapentin. She had some low back pain, was not sure if the gabapentin was helpful for that as well. She was stable and I suggested a one-year checkup. She did not realize that she had refills on her gabapentin and I made sure she had enough refills to last her until her next appointment.  Today, 06/19/2016: She reports that her leg pain has improved, she has not been taking her gabapentin, maybe none this year even. LBP better after she went to a chiropractor. Occasional pressure or congestion type feeling in the back of the head, no lightheadedness, no vertigo per se. She was recently started on Metoprolol ER 25 mg once daily some 6 weeks ago. Travels to [place] some, her daughter and her family are there, including GD age 53 and GS age 77.  Previously:  I saw her on 11/30/2014, at which time she reported feeling fairly stable. She had some low back pain with radiation to the left. She had occasional restless leg symptoms. She had no new complaints. I suggested a trial of low-dose gabapentin at night, 100 mg strength as needed for residual restless leg symptoms and to help improve her back discomfort.    I first met her on 10/31/2014 at the request of her primary care physician, at which time the patient reported a prior diagnosis of obstructive sleep apnea. I suggested we bring her back for  sleep study. She is a baseline sleep study on 11/06/2014 and underwent over her test results with her in detail today. Her sleep efficiency was reduced at 78.7% with a normal sleep latency and wake after sleep onset of 71.5 minutes with moderate to severe sleep fragmentation noted. She had an elevated arousal index. She had an increased percentage of stage I and stage II sleep, near absence of slow-wave sleep and a markedly decreased percentage of REM sleep at 3.9% with a very long REM latency. She had mild PLMS at 13.9 per hour, with arousals. She had a normal EKG and EEG. She had mild to moderate intermittent snoring. Her total AHI was normal at 1.4 per hour, baseline oxygen saturation 96%, nadir was 90%. REM AHI was 26.1 per hour.   She was diagnosed with obstructive sleep apnea several years ago. She was placed on CPAP therapy. I reviewed her baseline sleep study results performed at the [facility] sleep [facility] on 08/05/2009: Sleep efficiency was 91.4%. REM latency was 209 minutes. Arousal index was 19.1 per hour. Her total AHI was 9.3 per hour. Her REM AHI was 36.7 per hour. Average oxygen saturation was 96%, nadir was 82%. CPAP titration was recommended.    I reviewed her blood test results from 09/14/2014 through your office: TSH was unremarkable, hemoglobin A1c was 5.6, total cholesterol 177, HDL 58, LDL 97, CMP unremarkable, CBC with differential unremarkable very she had an EKG on 09/14/2014 which upon my review  looked unremarkable.   She brought her CPAP machine but reports that she has not used it in the last few months because of air leaking. There is no SD card of the machine.    She has lack of energy and occasional AM HAs, occasional night time palpitations and nocturia x 2 on average. In February, her GYN noted a new heart murmur, but her during her appointment on 09/14/14 showed no murmur and EKG was fine.    Her bedtime is around 11:30 PM and typically she falls asleep quickly.  Sometimes she wakes up in the middle of the night other than going to the bathroom. She has restless leg symptoms almost on a nightly basis but is not sure if she twitches or kicks in her sleep. She has no parasomnias. Her rise time is around 8 and she wakes up marginally rested on most days. She sleeps alone. She lives alone. She has one daughter who lives in the area but will be moving to [place] soon. The patient is retired and worked for [employer]. She is a nonsmoker and drinks alcohol rarely. She drinks coffee maybe once a day.  Her Past Medical History Is Significant For: Past Medical History:  Diagnosis Date  . Apnea, sleep    does wear cpap  . Arthritis   . Cancer ([place])    lt. breast ca-snbx  . Chronic kidney disease   . Contact lens/glasses fitting    wears contacts or glasses  . Heart murmur   . Hypercholesteremia   . Hyperlipemia   . Hypertension   . Lower back pain   . Malaise and fatigue   . Sleep apnea    SLEEP STUDY DX 10,CPAP BUT DON'TUSE  . Vitamin D deficiency   . Wears dentures    upper    Her Past Surgical History Is Significant For: Past Surgical History:  Procedure Laterality Date  . BREAST IMPLANT EXCHANGE Left 09/23/2012   Procedure: REMOVAL OF LEFT EXPANDER/PLACEMENT OF IMPLANT WITH REDUCTION OF RIGHT BREAST;  Surgeon: Theodoro Kos, DO;  Location: [place];  Service: Plastics;  Laterality: Left;  . BREAST LUMPECTOMY W/ NEEDLE LOCALIZATION  06/24/2011   left  . BREAST REDUCTION SURGERY Right 09/23/2012   Procedure: MAMMARY REDUCTION  (BREAST);  Surgeon: Theodoro Kos, DO;  Location: [place];  Service: Plastics;  Laterality: Right;  . BREAST SURGERY  08/07/2011   re-exc. left breast margins  . CYST REMOVAL NECK  08/2013   Dr. Migdalia Dk  . LIPOSUCTION Bilateral 09/23/2012   Procedure: LIPOSUCTION;  Surgeon: Theodoro Kos, DO;  Location: [place];  Service: Plastics;  Laterality: Bilateral;  . MOUTH SURGERY      TOOTH IMPLANT BOTTOM X2 LFT  . PORT-A-CATH REMOVAL Right 09/23/2012   Procedure: REMOVAL PORT-A-CATH;  Surgeon: Theodoro Kos, DO;  Location: [place];  Service: Plastics;  Laterality: Right;  . PORTACATH PLACEMENT  06/24/2011   Procedure: INSERTION PORT-A-CATH;  Surgeon: Judieth Keens, DO;  Location: [place];  Service: General;  Laterality: Right;  Right mediport placement  . RECONSTRUCTION BREAST IMMEDIATE / DELAYED W/ TISSUE EXPANDER  10/13   left  . SIMPLE MASTECTOMY  09/01/2011   left  . TONSILLECTOMY      Her Family History Is Significant For: Family History  Problem Relation Age of Onset  . Brain cancer Father   . Heart attack Mother   . Breast cancer Sister 44    Her Social  History Is Significant For: Social History   Social History  . Marital status: Widowed    Spouse name: N/A  . Number of children: 2  . Years of education: 14   Occupational History  . Retired    Social History Main Topics  . Smoking status: Never Smoker  . Smokeless tobacco: Never Used  . Alcohol use 1.2 oz/week    2 Glasses of wine per week     Comment: 8 oz occasionally per week  . Drug use: No  . Sexual activity: Yes    Partners: Male    Birth control/ protection: Post-menopausal   Other Topics Concern  . None   Social History Narrative   Daughter [place], [place]    Her Allergies Are:  Allergies  Allergen Reactions  . Dust Mite Extract Itching  . Other     grass  :   Her Current Medications Are:  Outpatient Encounter Prescriptions as of 06/19/2016  Medication Sig  . acetaminophen (TYLENOL) 500 MG tablet Take 500 mg by mouth every 6 (six) hours as needed.  Marland Kitchen atorvastatin (LIPITOR) 10 MG tablet Take 10 mg by mouth daily.    . calcium carbonate (OS-CAL) 600 MG TABS Take 600 mg by mouth 2 (two) times daily with a meal.    . Carboxymethylcellulose Sodium (REFRESH OP) Apply 1 drop to eye 2 (two) times daily as needed. For dry eyes  . diazepam  (VALIUM) 2 MG tablet   . fish oil-omega-3 fatty acids 1000 MG capsule Take 2 g by mouth daily.  Marland Kitchen gabapentin (NEURONTIN) 100 MG capsule Take 1 pill at bedtime as needed for leg pain.  Marland Kitchen loratadine (CLARITIN) 10 MG tablet Take 10 mg by mouth daily.  Marland Kitchen losartan-hydrochlorothiazide (HYZAAR) 50-12.5 MG per tablet 1 tablet daily.   . metoprolol succinate (TOPROL-XL) 25 MG 24 hr tablet Take 25 mg by mouth daily.   No facility-administered encounter medications on file as of 06/19/2016.   :  Review of Systems:  Out of a complete 14 point review of systems, all are reviewed and negative with the exception of these symptoms as listed below:  Review of Systems  Neurological:       Patient reports that she only takes Gabapentin as needed, and has not had any leg pain "for a while".   Patient states that she has had a few episodes of "light headedness".     Objective:  Neurologic Exam  Physical Exam Physical Examination:   Vitals:   06/19/16 1431  BP: 130/68  Pulse: 70  Resp: 16   General Examination: The patient is a very pleasant 65 y.o. female in no acute distress. She appears well-developed and well-nourished and very well groomed. She is in good spirits today.   HEENT: Normocephalic, atraumatic, pupils are equal, round and reactive to light and accommodation. Extraocular tracking is good without limitation to gaze excursion or nystagmus noted. Normal smooth pursuit is noted. Hearing is grossly intact. Face is symmetric with normal facial animation and normal facial sensation. Speech is clear with no dysarthria noted. There is no hypophonia. There is no lip, neck/head, jaw or voice tremor. Neck is supple with full range of passive and active motion. There are no carotid bruits on auscultation. Oropharynx exam reveals: mild mouth dryness, good dental hygiene and mild airway crowding, due to narrow airway entry, elongated tongue and redundant soft palate. Tonsils are absent. Mallampati is  class II. Tongue protrudes centrally and palate elevates symmetrically. She has a very  mild overbite.  Chest: Clear to auscultation without wheezing, rhonchi or crackles noted.  Heart: S1+S2+0, regular and normal without murmurs, rubs or gallops noted. Perhaps a transient systolic flow murmur noted.   Abdomen: Soft, non-tender and non-distended with normal bowel sounds appreciated on auscultation.  Extremities: There is no pitting edema in the distal lower extremities bilaterally. Pedal pulses are intact.  Skin: Warm and dry without trophic changes noted. There are no varicose veins.  Musculoskeletal: exam reveals no obvious joint deformities, tenderness or joint swelling or erythema.   Neurologically:  Mental status: The patient is awake, alert and oriented in all 4 spheres. Her immediate and remote memory, attention, language skills and fund of knowledge are appropriate. There is no evidence of aphasia, agnosia, apraxia or anomia. Speech is clear with normal prosody and enunciation. Thought process is linear. Mood is normal and affect is normal.  Cranial nerves II - XII are as described above under HEENT exam. In addition: shoulder shrug is normal with equal shoulder height noted. Motor exam: Normal bulk, strength and tone is noted. There is no drift, tremor or rebound. Romberg is negative. Reflexes are 2+ throughout. Fine motor skills and coordination: intact with normal finger taps, normal hand movements, normal rapid alternating patting, normal foot taps and normal foot agility.  Cerebellar testing: No dysmetria or intention tremor on finger to nose testing. Heel to shin is unremarkable bilaterally. There is no truncal or gait ataxia.  Sensory exam: intact to light touch, temp, vibration and PP in the upper and lower extremities.  Gait, station and balance: She stands easily. No veering to one side is noted. No leaning to one side is noted. Posture is age-appropriate and stance is narrow  based. Gait shows normal stride length and normal pace. No problems turning are noted. Tandem walk is unremarkable.  Assessment and Plan:   In summary, JAMISE PENTLAND is a very pleasant 65 year old female with an underlying medical history of hyperlipidemia, hypertension, vitamin D deficiency, chronic kidney disease, low back pain and obesity, who presents for follow-up consultation of her restless leg symptoms and leg pains, associated with PLMs. She has a prior diagnosis of OSA but her most sleep study from 11/06/2014 did not show any significant obstructive sleep apnea. She had some REM related sleep disordered breathing but no significant desaturations. She did have PLMs with some associated arousals, but only borderline PLMs. Her low back pain has improved. She has no significant recurrent symptoms of restless leg syndrome her leg pains at this time, tried gabapentin last year and has since then not really been taking gabapentin. Her physical exam is stable, no other new symptoms other than occasional pressure sensation in her head which is not really painful and she denies any vertigo symptoms or true lightheadedness. She has also recently been started on a beta blocker.  Her sleep study from 11/06/2014 showed poor sleep consolidation and significant sleep disruption as well as near absence of slow-wave sleep and very minimal REM sleep. For her REM related OSA she was advised to try to lose weight and sleep off her back. I suggested a 33-monthcheckup, sooner as needed. I answered all her questions today and the patient was in agreement. I spent 25 minutes in total face-to-face time with the patient, more than 50% of which was spent in counseling and coordination of care, reviewing test results, reviewing medication and discussing or reviewing the diagnosis of RLS and PLMD, the prognosis and treatment options.

## 2016-06-19 NOTE — Patient Instructions (Addendum)
Your exam is stable, non focal. I am pleased to hear your leg pains are better. Follow up in one year.  You may use the gabapentin as needed.

## 2016-06-26 ENCOUNTER — Ambulatory Visit
Admission: RE | Admit: 2016-06-26 | Discharge: 2016-06-26 | Disposition: A | Discharge status: Home / Self Care | Payer: Medicare Other | Source: Ambulatory Visit | Attending: Internal Medicine | Admitting: Internal Medicine

## 2016-06-26 DIAGNOSIS — Z1231 Encounter for screening mammogram for malignant neoplasm of breast: Secondary | ICD-10-CM | POA: Diagnosis not present

## 2016-07-28 DIAGNOSIS — R7309 Other abnormal glucose: Secondary | ICD-10-CM | POA: Diagnosis not present

## 2016-07-28 DIAGNOSIS — N182 Chronic kidney disease, stage 2 (mild): Secondary | ICD-10-CM | POA: Diagnosis not present

## 2016-07-28 DIAGNOSIS — I129 Hypertensive chronic kidney disease with stage 1 through stage 4 chronic kidney disease, or unspecified chronic kidney disease: Secondary | ICD-10-CM | POA: Diagnosis not present

## 2016-07-28 DIAGNOSIS — E785 Hyperlipidemia, unspecified: Secondary | ICD-10-CM | POA: Diagnosis not present

## 2016-09-26 ENCOUNTER — Ambulatory Visit (HOSPITAL_BASED_OUTPATIENT_CLINIC_OR_DEPARTMENT_OTHER): Payer: PPO | Admitting: Adult Health

## 2016-09-26 ENCOUNTER — Telehealth: Payer: Self-pay | Admitting: *Deleted

## 2016-09-26 ENCOUNTER — Encounter: Payer: Self-pay | Admitting: Adult Health

## 2016-09-26 ENCOUNTER — Ambulatory Visit (HOSPITAL_COMMUNITY)
Admission: RE | Admit: 2016-09-26 | Discharge: 2016-09-26 | Disposition: A | Discharge status: Home / Self Care | Payer: PPO | Source: Ambulatory Visit | Attending: Adult Health | Admitting: Adult Health

## 2016-09-26 VITALS — BP 129/69 | HR 77 | Temp 98.2°F | Resp 18 | Ht 61.0 in | Wt 170.2 lb

## 2016-09-26 DIAGNOSIS — N644 Mastodynia: Secondary | ICD-10-CM | POA: Diagnosis not present

## 2016-09-26 DIAGNOSIS — R079 Chest pain, unspecified: Secondary | ICD-10-CM | POA: Diagnosis not present

## 2016-09-26 DIAGNOSIS — Z170 Estrogen receptor positive status [ER+]: Secondary | ICD-10-CM | POA: Insufficient documentation

## 2016-09-26 DIAGNOSIS — C50212 Malignant neoplasm of upper-inner quadrant of left female breast: Secondary | ICD-10-CM | POA: Diagnosis not present

## 2016-09-26 DIAGNOSIS — Z1239 Encounter for other screening for malignant neoplasm of breast: Secondary | ICD-10-CM

## 2016-09-26 DIAGNOSIS — R21 Rash and other nonspecific skin eruption: Secondary | ICD-10-CM

## 2016-09-26 MED ORDER — TRIAMCINOLONE 0.1 % CREAM:EUCERIN CREAM 1:1: 1.0000 "application " | TOPICAL_CREAM | Freq: Two times a day (BID) | CUTANEOUS | 0 refills | Status: DC | PRN

## 2016-09-26 MED ORDER — TRIAMCINOLONE ACETONIDE 0.025 % EX CREA: TOPICAL_CREAM | Freq: Two times a day (BID) | CUTANEOUS | Status: DC

## 2016-09-26 MED ORDER — PREDNISONE 10 MG (21) PO TBPK: ORAL_TABLET | ORAL | 0 refills | Status: DC

## 2016-09-26 NOTE — Telephone Encounter (Signed)
RN informed patient of normal X-ray results. Patient verbalized understanding.

## 2016-09-26 NOTE — Progress Notes (Signed)
CLINIC:  Survivorship   REASON FOR VISIT:  Routine follow-up for history of breast cancer.   BRIEF ONCOLOGIC HISTORY:    Breast cancer of upper-inner quadrant of left female breast ([place])   06/12/2011 Initial Diagnosis    Cancer of upper-inner quadrant of female breast: Grade 3 IDC ER negative PR negative HER-2 negative, MRI 2.7 x 2.3 x 1 cm mass      06/24/2011 Surgery    Left breast lumpectomy 2.2 cm IDC grade 3 with high-grade DCIS triple negative, for positive margins patient had reexcision: 2 lymph nodes negative ER/PR negative HER-2 negative ratio 0.97 Ki-67 75%      09/02/2011 Surgery    Left mastectomy      10/01/2011 - 12/03/2011 Chemotherapy    Adjuvant Taxotere and Cytoxan x4 cycles      07/05/2012 Surgery    Breast reconstruction on the left breast with reduction of right breast        INTERVAL HISTORY:  Ms. Godino presents to the [facility] today for routine follow-up for her history of breast cancer.  Overall, she reports feeling quite well. She has no residual periopheral neuropathy, or any complaints since completing her treatment a few years ago.  She is itching today and cannot figure out why.  She thinks it is ? Feather pillows in her home, she has done product elimination on everything else.  She underwent her mammo last December.  Since then she has had a vague bilateral breast pain.  Her right breast is tender when touched, and behind her left breast (implant) it just doesn't feel right which is new for her.    REVIEW OF SYSTEMS:  Review of Systems  Constitutional: Negative for chills, diaphoresis, fever, malaise/fatigue and weight loss.  HENT: Negative for hearing loss and tinnitus.   Eyes: Negative for blurred vision and double vision.  Respiratory: Negative for cough and shortness of breath.   Cardiovascular: Negative for chest pain, palpitations and leg swelling.  Gastrointestinal: Negative for abdominal pain, blood in stool, constipation,  diarrhea, heartburn, melena, nausea and vomiting.  Genitourinary: Negative for dysuria and urgency.  Musculoskeletal: Negative for joint pain and myalgias.  Skin: Negative for rash.  Neurological: Negative for dizziness, weakness and headaches.  Endo/Heme/Allergies: Negative for environmental allergies. Does not bruise/bleed easily.  Psychiatric/Behavioral: Negative for depression. The patient is not nervous/anxious.   Breast: Denies any new nodularity, masses, tenderness, nipple changes, or nipple discharge.    PAST MEDICAL/SURGICAL HISTORY:  Past Medical History:  Diagnosis Date  . Apnea, sleep    does wear cpap  . Arthritis   . Cancer ([place])    lt. breast ca-snbx  . Chronic kidney disease   . Contact lens/glasses fitting    wears contacts or glasses  . Heart murmur   . Hypercholesteremia   . Hyperlipemia   . Hypertension   . Lower back pain   . Malaise and fatigue   . Sleep apnea    SLEEP STUDY DX 10,CPAP BUT DON'TUSE  . Vitamin D deficiency   . Wears dentures    upper   Past Surgical History:  Procedure Laterality Date  . BREAST IMPLANT EXCHANGE Left 09/23/2012   Procedure: REMOVAL OF LEFT EXPANDER/PLACEMENT OF IMPLANT WITH REDUCTION OF RIGHT BREAST;  Surgeon: Theodoro Kos, DO;  Location: [place];  Service: Plastics;  Laterality: Left;  . BREAST LUMPECTOMY W/ NEEDLE LOCALIZATION  06/24/2011   left  . BREAST REDUCTION SURGERY Right 09/23/2012   Procedure: MAMMARY  REDUCTION  (BREAST);  Surgeon: Theodoro Kos, DO;  Location: [place];  Service: Plastics;  Laterality: Right;  . BREAST SURGERY  08/07/2011   re-exc. left breast margins  . CYST REMOVAL NECK  08/2013   Dr. Migdalia Dk  . LIPOSUCTION Bilateral 09/23/2012   Procedure: LIPOSUCTION;  Surgeon: Theodoro Kos, DO;  Location: [place];  Service: Plastics;  Laterality: Bilateral;  . MOUTH SURGERY     TOOTH IMPLANT BOTTOM X2 LFT  . PORT-A-CATH REMOVAL Right 09/23/2012    Procedure: REMOVAL PORT-A-CATH;  Surgeon: Theodoro Kos, DO;  Location: [place];  Service: Plastics;  Laterality: Right;  . PORTACATH PLACEMENT  06/24/2011   Procedure: INSERTION PORT-A-CATH;  Surgeon: Judieth Keens, DO;  Location: [place];  Service: General;  Laterality: Right;  Right mediport placement  . RECONSTRUCTION BREAST IMMEDIATE / DELAYED W/ TISSUE EXPANDER  10/13   left  . SIMPLE MASTECTOMY  09/01/2011   left  . TONSILLECTOMY       ALLERGIES:  Allergies  Allergen Reactions  . Dust Mite Extract Itching  . Other     grass     CURRENT MEDICATIONS:  Outpatient Encounter Prescriptions as of 09/26/2016  Medication Sig  . acetaminophen (TYLENOL) 500 MG tablet Take 500 mg by mouth every 6 (six) hours as needed.  Marland Kitchen atorvastatin (LIPITOR) 10 MG tablet Take 10 mg by mouth daily.    . calcium carbonate (OS-CAL) 600 MG TABS Take 600 mg by mouth 2 (two) times daily with a meal.    . Carboxymethylcellulose Sodium (REFRESH OP) Apply 1 drop to eye 2 (two) times daily as needed. For dry eyes  . diazepam (VALIUM) 2 MG tablet   . fish oil-omega-3 fatty acids 1000 MG capsule Take 2 g by mouth daily.  Marland Kitchen gabapentin (NEURONTIN) 100 MG capsule Take 1 pill at bedtime as needed for leg pain.  Marland Kitchen loratadine (CLARITIN) 10 MG tablet Take 10 mg by mouth daily.  Marland Kitchen losartan-hydrochlorothiazide (HYZAAR) 50-12.5 MG per tablet 1 tablet daily.   . metoprolol succinate (TOPROL-XL) 25 MG 24 hr tablet Take 25 mg by mouth daily.  . predniSONE (STERAPRED UNI-PAK 21 TAB) 10 MG (21) TBPK tablet Taper 6,5,4,3,2,1  . Triamcinolone Acetonide (TRIAMCINOLONE 0.1 % CREAM : EUCERIN) CREA Apply 1 application topically 2 (two) times daily as needed.  . [DISCONTINUED] triamcinolone (KENALOG) 0.025 % cream    No facility-administered encounter medications on file as of 09/26/2016.      ONCOLOGIC FAMILY HISTORY:  Family History  Problem Relation Age of Onset  . Brain cancer Father     . Heart attack Mother   . Breast cancer Sister 30    SOCIAL HISTORY:  JERRIE SCHUSSLER is single and lives alone in [place], [place].  S  Ms. Malanga is currently retired.  She denies any current or history of tobacco, alcohol, or illicit drug use.     PHYSICAL EXAMINATION:  Vital Signs: Vitals:   09/26/16 0942  BP: 129/69  Pulse: 77  Resp: 18  Temp: 98.2 F (36.8 C)   Filed Weights   09/26/16 0942  Weight: 170 lb 3.2 oz (77.2 kg)   General: Well-nourished, well-appearing female in no acute distress.  Unaccompanied today.   HEENT: Head is normocephalic.  Pupils equal and reactive to light. Conjunctivae clear without exudate.  Sclerae anicteric. Oral mucosa is pink, moist.  Oropharynx is pink without lesions or erythema.  Lymph: No cervical, supraclavicular, or infraclavicular lymphadenopathy  noted on palpation.  Cardiovascular: Regular rate and rhythm.Marland Kitchen Respiratory: Clear to auscultation bilaterally. Chest expansion symmetric; breathing non-labored.  Breast Exam:  -Left breast: s/p  Mastectomy and implant placement, no swelling, erythema, nodularity, mass, or tenderness noted. -Right breast: No appreciable masses on palpation. No skin redness, thickening, or peau d'orange appearance; no nipple retraction or nipple discharge; tender to palpation,  mild distortion in symmetry at previous mammoplasty site well healed scar without erythema or nodularity. -Axilla: No axillary adenopathy bilaterally.  GI: Abdomen soft and round; non-tender, non-distended. Bowel sounds normoactive. No hepatosplenomegaly.   GU: Deferred.  Neuro: No focal deficits. Steady gait.  Psych: Mood and affect normal and appropriate for situation.  Extremities: No edema. Skin: Warm and dry.  LABORATORY DATA:  None for this visit   DIAGNOSTIC IMAGING:  Most recent mammogram:      ASSESSMENT AND PLAN:  Ms.. Stanback is a pleasant 66 y.o. female with history of Stage IIA left breast invasive ductal  carcinoma, ER-/PR-/HER2-, diagnosed in 2012, treated with lumpectomy followed by mastectomy, and adjuvant chemotherapy.    She [facility] for surveillance and routine follow-up.   1. History of breast cancer:  Ms. Glasper is currently clinically and radiographically without evidence of disease or recurrence of breast cancer. She will be due for mammogram in 06/2017; orders placed today.   I encouraged her to call me with any questions or concerns before her next visit at the cancer center, and I would be happy to see her sooner, if needed.    2. Breast pain: This has been going on since her MRI.  It is not associated with anything else such as deep breaths, or movement.  She will undergo chest xray and if normal will do MRI.  .  3. Bone health:  Given Ms. Jonas age, history of breast cancer, she is at risk for bone demineralization. She was encouraged to continue to f/u with her PCP about DEXA scans and ordering of such.  She was encouraged to continue with consumption of foods rich in calcium, as well as increase her weight-bearing activities.  She was given education on specific food and activities to promote bone health.  4. Cancer screening:  Due to Ms. Rochelle's history and her age, she should receive screening for skin cancers, colon cancer, and gynecologic cancers. She was encouraged to follow-up with her PCP for appropriate cancer screenings.   5. Health maintenance and wellness promotion: Ms. Record was encouraged to consume 5-7 servings of fruits and vegetables per day. She was also encouraged to engage in moderate to vigorous exercise for 30 minutes per day most days of the week. She was instructed to limit her alcohol consumption and continue to abstain from tobacco use.   7. Itching:  I sent in triamcinolone cream to her pharmacy, along with a prednisone taper.  If it continues she will need to see her PCP.    Dispo:  -Return to cancer center in one year for follow  up   A total of (30) minutes of face-to-face time was spent with this patient with greater than 50% of that time in counseling and care-coordination.   Charlestine Massed, NP [facility] [place] [phone]   Note: PRIMARY CARE PROVIDER [name], [address] [phone]

## 2016-09-30 ENCOUNTER — Encounter: Payer: Medicare Other | Admitting: Nurse Practitioner

## 2016-10-09 ENCOUNTER — Telehealth: Payer: Self-pay

## 2016-10-09 NOTE — Telephone Encounter (Signed)
Pt called to discuss results of her xray. Told pt that the xray results were normal. Pt states that she received a call from [facility] imaging regarding setting up a scheduled appt for her breast MRI. Per Lindsey,NP, pt to proceed with MRI if results from xray were normal. Pt verbalized understanding and grateful for clarifying this information. Told pt that an order for her annual mammogram is due in December this yr. Pt verbalized understanding and will call the breast center office to get her MRI done. Pt has no further questions or concerns at this time.

## 2016-10-09 NOTE — Telephone Encounter (Signed)
error 

## 2016-10-15 ENCOUNTER — Ambulatory Visit (HOSPITAL_COMMUNITY)
Admission: RE | Admit: 2016-10-15 | Discharge: 2016-10-15 | Disposition: A | Discharge status: Home / Self Care | Payer: PPO | Source: Ambulatory Visit | Attending: Adult Health | Admitting: Adult Health

## 2016-10-15 DIAGNOSIS — Z9012 Acquired absence of left breast and nipple: Secondary | ICD-10-CM | POA: Diagnosis not present

## 2016-10-15 DIAGNOSIS — Z9882 Breast implant status: Secondary | ICD-10-CM | POA: Diagnosis not present

## 2016-10-15 DIAGNOSIS — Z170 Estrogen receptor positive status [ER+]: Secondary | ICD-10-CM | POA: Diagnosis not present

## 2016-10-15 DIAGNOSIS — C50212 Malignant neoplasm of upper-inner quadrant of left female breast: Secondary | ICD-10-CM | POA: Diagnosis not present

## 2016-10-15 DIAGNOSIS — N644 Mastodynia: Secondary | ICD-10-CM | POA: Diagnosis not present

## 2016-10-15 LAB — POCT I-STAT CREATININE: CREATININE: 1 mg/dL (ref 0.44–1.00)

## 2016-10-15 IMAGING — NM NM BONE WHOLE BODY
4 series · 4 of 4 positions shown · non-contrast
Comparison: Nuclear bone scan of July 04, 2011

CLINICAL DATA: History of breast malignancy now with recent onset
of right arm pain since August 2014; chronic pelvic and left lower
extremity pain

EXAM:
NUCLEAR MEDICINE WHOLE BODY BONE SCAN
TECHNIQUE: Whole body anterior and posterior images were obtained approximately
3 hours after intravenous injection of radiopharmaceutical.
RADIOPHARMACEUTICALS:  Twenty-five mCi Kechnetium-22 MDP

[Series 1: wbr_bone_60 whole body · 2.66mm/px · 1 of 1 slices shown (1 of 2)]
[im 1/1]
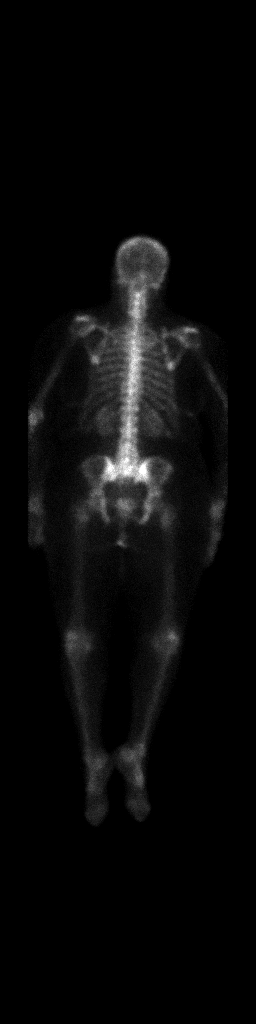

[Series 1: whole body · 2.66mm/px · 1 of 1 slices shown (1 of 2)]
[im 1/1]
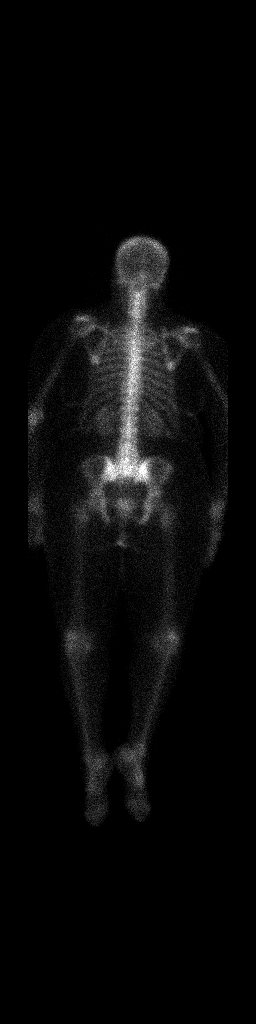

[Series 1: wbr_bone_60 whole body · 2.66mm/px · 1 of 1 slices shown (2 of 2)]
[im 1/1]
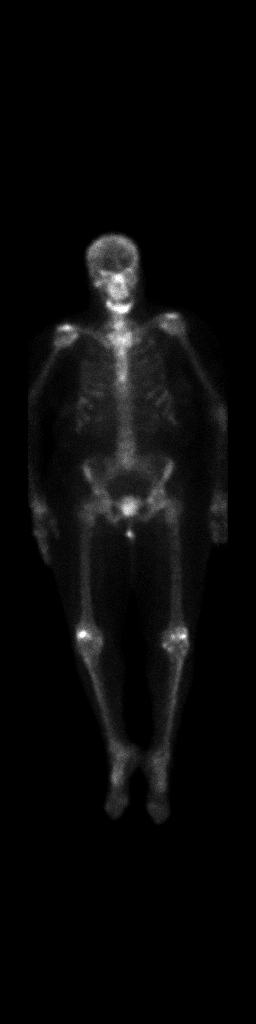

[Series 1: whole body · 2.66mm/px · 1 of 1 slices shown (2 of 2)]
[im 1/1]
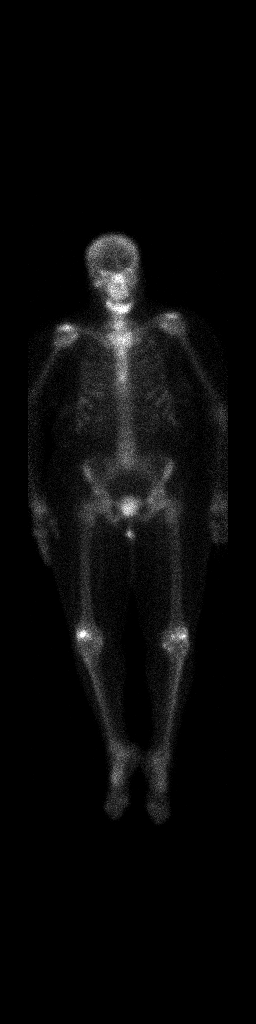

[4 of 4 positions shown; findings below may reference images not displayed]

FINDINGS: There is adequate uptake of the radiopharmaceutical by the skeleton.
There is adequate soft tissue clearance and renal activity.

Uptake over the calvarium, spine, pelvis, and lower extremities with
is within the limits of normal. There is mildly increased uptake in
the anterior aspects of sixth through tenth ribs bilaterally. This
is somewhat similar to that seen previously. Uptake over the
shoulders is symmetric and stable. Mildly increased uptake in the
sternoclavicular joints is fairly symmetric.

Uptake over the right humerus is normal. There remainder of the arms
also exhibit no suspicious uptake.
IMPRESSION: There no findings suspicious for metastatic disease to the skeleton.
Specific attention to the right humerus, bony pelvis, and left lower
extremity reveals no acute abnormality. There is stable mildly
increased uptake in the anterior aspects of the sixth through tenth
ribs bilaterally.

## 2016-10-15 MED ORDER — GADOBENATE DIMEGLUMINE 529 MG/ML IV SOLN
20.0000 mL | Freq: Once | INTRAVENOUS | Status: AC | PRN
  Administered 2016-10-15: 15 mL via INTRAVENOUS

## 2016-10-16 ENCOUNTER — Other Ambulatory Visit: Payer: Self-pay | Admitting: Adult Health

## 2016-10-16 ENCOUNTER — Telehealth: Payer: Self-pay

## 2016-10-16 DIAGNOSIS — N649 Disorder of breast, unspecified: Secondary | ICD-10-CM

## 2016-10-16 NOTE — Telephone Encounter (Signed)
Called pt with MRI results, s/w her granddaughter. Informed her we need to order an MRI guided biopsy d/t MRI results. Granddaughter asked we call back in about an hour when pt will be home.

## 2016-10-16 NOTE — Telephone Encounter (Signed)
s/w pt results of breast MRI and we would like to have a MRI guided biopsy on her R breast. Phone number for [facility] given to pt. She stated she will call them right now to schedule.

## 2016-10-27 ENCOUNTER — Ambulatory Visit
Admission: RE | Admit: 2016-10-27 | Discharge: 2016-10-27 | Disposition: A | Discharge status: Home / Self Care | Payer: PPO | Source: Ambulatory Visit | Attending: Adult Health | Admitting: Adult Health

## 2016-10-27 DIAGNOSIS — N649 Disorder of breast, unspecified: Secondary | ICD-10-CM

## 2016-10-27 DIAGNOSIS — R928 Other abnormal and inconclusive findings on diagnostic imaging of breast: Secondary | ICD-10-CM | POA: Diagnosis not present

## 2016-10-27 DIAGNOSIS — R938 Abnormal findings on diagnostic imaging of other specified body structures: Secondary | ICD-10-CM | POA: Diagnosis not present

## 2016-10-27 DIAGNOSIS — N6489 Other specified disorders of breast: Secondary | ICD-10-CM | POA: Diagnosis not present

## 2016-10-27 MED ORDER — GADOBENATE DIMEGLUMINE 529 MG/ML IV SOLN
15.0000 mL | Freq: Once | INTRAVENOUS | Status: AC | PRN
  Administered 2016-10-27: 15 mL via INTRAVENOUS

## 2016-10-29 HISTORY — PX: BREAST BIOPSY: SHX20

## 2016-11-05 DIAGNOSIS — Z13820 Encounter for screening for osteoporosis: Secondary | ICD-10-CM | POA: Diagnosis not present

## 2016-11-05 DIAGNOSIS — N393 Stress incontinence (female) (male): Secondary | ICD-10-CM | POA: Diagnosis not present

## 2016-11-05 DIAGNOSIS — C50912 Malignant neoplasm of unspecified site of left female breast: Secondary | ICD-10-CM | POA: Diagnosis not present

## 2016-11-05 DIAGNOSIS — R87618 Other abnormal cytological findings on specimens from cervix uteri: Secondary | ICD-10-CM | POA: Diagnosis not present

## 2016-11-05 DIAGNOSIS — Z124 Encounter for screening for malignant neoplasm of cervix: Secondary | ICD-10-CM | POA: Diagnosis not present

## 2016-11-05 DIAGNOSIS — Z6833 Body mass index (BMI) 33.0-33.9, adult: Secondary | ICD-10-CM | POA: Diagnosis not present

## 2016-11-05 DIAGNOSIS — Z01411 Encounter for gynecological examination (general) (routine) with abnormal findings: Secondary | ICD-10-CM | POA: Diagnosis not present

## 2016-11-25 DIAGNOSIS — I340 Nonrheumatic mitral (valve) insufficiency: Secondary | ICD-10-CM | POA: Diagnosis not present

## 2016-11-25 DIAGNOSIS — R002 Palpitations: Secondary | ICD-10-CM | POA: Diagnosis not present

## 2016-11-25 DIAGNOSIS — I10 Essential (primary) hypertension: Secondary | ICD-10-CM | POA: Diagnosis not present

## 2016-11-25 DIAGNOSIS — E7800 Pure hypercholesterolemia, unspecified: Secondary | ICD-10-CM | POA: Diagnosis not present

## 2016-12-29 DIAGNOSIS — R002 Palpitations: Secondary | ICD-10-CM | POA: Diagnosis not present

## 2017-02-24 DIAGNOSIS — E559 Vitamin D deficiency, unspecified: Secondary | ICD-10-CM | POA: Diagnosis not present

## 2017-02-24 DIAGNOSIS — R7309 Other abnormal glucose: Secondary | ICD-10-CM | POA: Diagnosis not present

## 2017-02-24 DIAGNOSIS — N182 Chronic kidney disease, stage 2 (mild): Secondary | ICD-10-CM | POA: Diagnosis not present

## 2017-02-24 DIAGNOSIS — Z0000 Encounter for general adult medical examination without abnormal findings: Secondary | ICD-10-CM | POA: Diagnosis not present

## 2017-02-24 DIAGNOSIS — I129 Hypertensive chronic kidney disease with stage 1 through stage 4 chronic kidney disease, or unspecified chronic kidney disease: Secondary | ICD-10-CM | POA: Diagnosis not present

## 2017-02-26 ENCOUNTER — Other Ambulatory Visit: Payer: Self-pay | Admitting: Internal Medicine

## 2017-02-26 DIAGNOSIS — E2839 Other primary ovarian failure: Secondary | ICD-10-CM

## 2017-03-04 DIAGNOSIS — H16421 Pannus (corneal), right eye: Secondary | ICD-10-CM | POA: Diagnosis not present

## 2017-03-04 DIAGNOSIS — H2513 Age-related nuclear cataract, bilateral: Secondary | ICD-10-CM | POA: Diagnosis not present

## 2017-05-12 DIAGNOSIS — I340 Nonrheumatic mitral (valve) insufficiency: Secondary | ICD-10-CM | POA: Diagnosis not present

## 2017-05-12 DIAGNOSIS — I10 Essential (primary) hypertension: Secondary | ICD-10-CM | POA: Diagnosis not present

## 2017-05-18 ENCOUNTER — Telehealth: Payer: Self-pay | Admitting: *Deleted

## 2017-05-18 NOTE — Telephone Encounter (Signed)
Called and clarified pt question regarding her MRI and mammogram. No further questions at this time.

## 2017-05-18 NOTE — Telephone Encounter (Signed)
"  I'm looking through my chart and have questions for [place].  Could she return my call [phone]."  Routing call information to collaborative nurse and provider for review.  Further patient communication through collaborative nurse.

## 2017-05-20 DIAGNOSIS — H524 Presbyopia: Secondary | ICD-10-CM | POA: Diagnosis not present

## 2017-05-20 DIAGNOSIS — H5203 Hypermetropia, bilateral: Secondary | ICD-10-CM | POA: Diagnosis not present

## 2017-05-22 ENCOUNTER — Other Ambulatory Visit: Payer: Self-pay | Admitting: Adult Health

## 2017-05-22 DIAGNOSIS — Z853 Personal history of malignant neoplasm of breast: Secondary | ICD-10-CM

## 2017-05-22 DIAGNOSIS — N644 Mastodynia: Secondary | ICD-10-CM

## 2017-05-22 DIAGNOSIS — Z09 Encounter for follow-up examination after completed treatment for conditions other than malignant neoplasm: Secondary | ICD-10-CM

## 2017-05-22 DIAGNOSIS — Z9012 Acquired absence of left breast and nipple: Secondary | ICD-10-CM

## 2017-05-26 ENCOUNTER — Other Ambulatory Visit: Payer: Self-pay | Admitting: Internal Medicine

## 2017-05-26 DIAGNOSIS — Z1231 Encounter for screening mammogram for malignant neoplasm of breast: Secondary | ICD-10-CM

## 2017-05-27 ENCOUNTER — Ambulatory Visit
Admission: RE | Admit: 2017-05-27 | Discharge: 2017-05-27 | Disposition: A | Discharge status: Home / Self Care | Payer: PPO | Source: Ambulatory Visit | Attending: Adult Health | Admitting: Adult Health

## 2017-05-27 ENCOUNTER — Telehealth: Payer: Self-pay

## 2017-05-27 DIAGNOSIS — Z853 Personal history of malignant neoplasm of breast: Secondary | ICD-10-CM

## 2017-05-27 DIAGNOSIS — Z9012 Acquired absence of left breast and nipple: Secondary | ICD-10-CM

## 2017-05-27 DIAGNOSIS — Z09 Encounter for follow-up examination after completed treatment for conditions other than malignant neoplasm: Secondary | ICD-10-CM

## 2017-05-27 DIAGNOSIS — N6489 Other specified disorders of breast: Secondary | ICD-10-CM | POA: Diagnosis not present

## 2017-05-27 MED ORDER — GADOBENATE DIMEGLUMINE 529 MG/ML IV SOLN
15.0000 mL | Freq: Once | INTRAVENOUS | Status: AC | PRN
  Administered 2017-05-27: 15 mL via INTRAVENOUS

## 2017-05-27 NOTE — Telephone Encounter (Signed)
-----   Message from Gardenia Phlegm, NP sent at 05/27/2017  3:27 PM EST ----- MRI normal.  Please notify patient. ----- Message ----- From: Interface, Rad Results In Sent: 05/27/2017   2:06 PM To: Gardenia Phlegm, NP

## 2017-05-27 NOTE — Telephone Encounter (Signed)
Spoke with pt and let her know that MRI results were normal. Pt very pleased.  No questions or concerns at this time.

## 2017-06-02 ENCOUNTER — Other Ambulatory Visit: Payer: Self-pay | Admitting: Internal Medicine

## 2017-06-02 DIAGNOSIS — Z1231 Encounter for screening mammogram for malignant neoplasm of breast: Secondary | ICD-10-CM

## 2017-06-22 ENCOUNTER — Telehealth: Payer: Self-pay

## 2017-06-22 ENCOUNTER — Ambulatory Visit: Payer: Medicare Other | Admitting: Neurology

## 2017-06-22 NOTE — Telephone Encounter (Signed)
Pt did not show for their appt with Dr. Athar today.  

## 2017-06-23 ENCOUNTER — Encounter: Payer: Self-pay | Admitting: Neurology

## 2017-06-25 DIAGNOSIS — C50912 Malignant neoplasm of unspecified site of left female breast: Secondary | ICD-10-CM | POA: Diagnosis not present

## 2017-06-25 DIAGNOSIS — Z9011 Acquired absence of right breast and nipple: Secondary | ICD-10-CM | POA: Diagnosis not present

## 2017-06-30 ENCOUNTER — Ambulatory Visit: Payer: PPO

## 2017-06-30 ENCOUNTER — Ambulatory Visit
Admission: RE | Admit: 2017-06-30 | Discharge: 2017-06-30 | Disposition: A | Discharge status: Home / Self Care | Payer: PPO | Source: Ambulatory Visit | Attending: Internal Medicine | Admitting: Internal Medicine

## 2017-06-30 DIAGNOSIS — Z1231 Encounter for screening mammogram for malignant neoplasm of breast: Secondary | ICD-10-CM | POA: Diagnosis not present

## 2017-06-30 HISTORY — DX: Personal history of antineoplastic chemotherapy: Z92.21

## 2017-08-06 DIAGNOSIS — E559 Vitamin D deficiency, unspecified: Secondary | ICD-10-CM | POA: Diagnosis not present

## 2017-08-06 DIAGNOSIS — I129 Hypertensive chronic kidney disease with stage 1 through stage 4 chronic kidney disease, or unspecified chronic kidney disease: Secondary | ICD-10-CM | POA: Diagnosis not present

## 2017-08-06 DIAGNOSIS — R7309 Other abnormal glucose: Secondary | ICD-10-CM | POA: Diagnosis not present

## 2017-08-06 DIAGNOSIS — E785 Hyperlipidemia, unspecified: Secondary | ICD-10-CM | POA: Diagnosis not present

## 2017-09-02 ENCOUNTER — Ambulatory Visit (INDEPENDENT_AMBULATORY_CARE_PROVIDER_SITE_OTHER): Payer: PPO | Admitting: Neurology

## 2017-09-02 ENCOUNTER — Encounter: Payer: Self-pay | Admitting: Neurology

## 2017-09-02 ENCOUNTER — Ambulatory Visit: Payer: Medicare Other | Admitting: Neurology

## 2017-09-02 VITALS — BP 151/87 | HR 77 | Ht 60.0 in | Wt 172.0 lb

## 2017-09-02 DIAGNOSIS — G2581 Restless legs syndrome: Secondary | ICD-10-CM | POA: Diagnosis not present

## 2017-09-02 DIAGNOSIS — G4761 Periodic limb movement disorder: Secondary | ICD-10-CM

## 2017-09-02 MED ORDER — GABAPENTIN 100 MG PO CAPS: ORAL_CAPSULE | ORAL | 3 refills | Status: DC

## 2017-09-02 NOTE — Progress Notes (Signed)
Subjective:    Patient ID: Martha Pham is a 67 y.o. female.  HPI     Interim history:  Martha Pham is a 67 year old right-handed woman with an underlying medical history of hyperlipidemia, hypertension, vitamin D deficiency, chronic kidney disease, breast cancer in 2012 (s/p lumpectomies, L mastectomy, and chemo), low back pain and obesity, who presents for follow-up consultation of her restless leg syndrome, on Rx with gabapentin prn. The patient is unaccompanied today. Of note, she no showed for an appointment on 06/22/2017. I last saw her on 06/19/2016, at which time she reported improvement in her leg pain. She had not been taking gabapentin regularly. She had improvement in her lower back pain after she went to the chiropractor. She was advised to follow-up routinely in one year and utilize gabapentin as needed.  Today, 09/02/2017: She reports that she eventually stopped taking gabapentin. She admits that she did not have any refills left and as she was taking it as needed she phased out of it. Nevertheless, she does have intermittent leg discomfort at night in particular, left side worse than right. She has had fluctuation in her blood pressure and weight. She no longer has to see her cardiologist. She recently went on a cruise in January.  The patient's allergies, current medications, family history, past medical history, past social history, past surgical history and problem list were reviewed and updated as appropriate.   Previously (copied from previous notes for reference):   I saw her on 06/05/2015, at which time she reported doing fairly well. She had noticed improvement of her restless leg symptoms on gabapentin. She had some low back pain, was not sure if the gabapentin was helpful for that as well. She was stable and I suggested a one-year checkup. She did not realize that she had refills on her gabapentin and I made sure she had enough refills to last her until her next  appointment.   I saw her on 11/30/2014, at which time she reported feeling fairly stable. She had some low back pain with radiation to the left. She had occasional restless leg symptoms. She had no new complaints. I suggested a trial of low-dose gabapentin at night, 100 mg strength as needed for residual restless leg symptoms and to help improve her back discomfort.    I first met her on 10/31/2014 at the request of her primary care physician, at which time the patient reported a prior diagnosis of obstructive sleep apnea. I suggested we bring her back for sleep study. She is a baseline sleep study on 11/06/2014 and underwent over her test results with her in detail today. Her sleep efficiency was reduced at 78.7% with a normal sleep latency and wake after sleep onset of 71.5 minutes with moderate to severe sleep fragmentation noted. She had an elevated arousal index. She had an increased percentage of stage I and stage II sleep, near absence of slow-wave sleep and a markedly decreased percentage of REM sleep at 3.9% with a very long REM latency. She had mild PLMS at 13.9 per hour, with arousals. She had a normal EKG and EEG. She had mild to moderate intermittent snoring. Her total AHI was normal at 1.4 per hour, baseline oxygen saturation 96%, nadir was 90%. REM AHI was 26.1 per hour.   She was diagnosed with obstructive sleep apnea several years ago. She was placed on CPAP therapy. I reviewed her baseline sleep study results performed at the [facility] heart [facility] sleep [facility] on 08/05/2009: Sleep  efficiency was 91.4%. REM latency was 209 minutes. Arousal index was 19.1 per hour. Her total AHI was 9.3 per hour. Her REM AHI was 36.7 per hour. Average oxygen saturation was 96%, nadir was 82%. CPAP titration was recommended.    I reviewed her blood test results from 09/14/2014 through your office: TSH was unremarkable, hemoglobin A1c was 5.6, total cholesterol 177, HDL 58, LDL 97, CMP unremarkable, CBC with  differential unremarkable very she had an EKG on 09/14/2014 which upon my review looked unremarkable.   She brought her CPAP machine but reports that she has not used it in the last few months because of air leaking. There is no SD card of the machine.    She has lack of energy and occasional AM HAs, occasional night time palpitations and nocturia x 2 on average. In February, her GYN noted a new heart murmur, but her during her appointment on 09/14/14 showed no murmur and EKG was fine.    Her bedtime is around 11:30 PM and typically she falls asleep quickly. Sometimes she wakes up in the middle of the night other than going to the bathroom. She has restless leg symptoms almost on a nightly basis but is not sure if she twitches or kicks in her sleep. She has no parasomnias. Her rise time is around 8 and she wakes up marginally rested on most days. She sleeps alone. She lives alone. She has one daughter who lives in the area but will be moving to [place] soon. The patient is retired and worked for [employer]. She is a nonsmoker and drinks alcohol rarely. She drinks coffee maybe once a day.   Her Past Medical History Is Significant For: Past Medical History:  Diagnosis Date  . Apnea, sleep    does wear cpap  . Arthritis   . Cancer ([place])    lt. breast ca-snbx  . Chronic kidney disease   . Contact lens/glasses fitting    wears contacts or glasses  . Heart murmur   . Hypercholesteremia   . Hyperlipemia   . Hypertension   . Lower back pain   . Malaise and fatigue   . Personal history of chemotherapy 2013  . Sleep apnea    SLEEP STUDY DX 10,CPAP BUT DON'TUSE  . Vitamin D deficiency   . Wears dentures    upper    Her Past Surgical History Is Significant For: Past Surgical History:  Procedure Laterality Date  . BREAST BIOPSY Left 05/26/2011   malignant  . BREAST BIOPSY Right 10/29/2016  . BREAST IMPLANT EXCHANGE Left 09/23/2012   Procedure: REMOVAL OF LEFT EXPANDER/PLACEMENT OF IMPLANT  WITH REDUCTION OF RIGHT BREAST;  Surgeon: Theodoro Kos, DO;  Location: [facility];  Service: Plastics;  Laterality: Left;  . BREAST LUMPECTOMY W/ NEEDLE LOCALIZATION  06/24/2011   left  . BREAST REDUCTION SURGERY Right 09/23/2012   Procedure: MAMMARY REDUCTION  (BREAST);  Surgeon: Theodoro Kos, DO;  Location: [facility];  Service: Plastics;  Laterality: Right;  . BREAST SURGERY  08/07/2011   re-exc. left breast margins  . CYST REMOVAL NECK  08/2013   Dr. Migdalia Dk  . LIPOSUCTION Bilateral 09/23/2012   Procedure: LIPOSUCTION;  Surgeon: Theodoro Kos, DO;  Location: [facility];  Service: Plastics;  Laterality: Bilateral;  . MASTECTOMY Left 2012  . MOUTH SURGERY     TOOTH IMPLANT BOTTOM X2 LFT  . PORT-A-CATH REMOVAL Right 09/23/2012   Procedure: REMOVAL PORT-A-CATH;  Surgeon: Theodoro Kos, DO;  Location: [facility];  Service: Plastics;  Laterality: Right;  . PORTACATH PLACEMENT  06/24/2011   Procedure: INSERTION PORT-A-CATH;  Surgeon: Judieth Keens, DO;  Location: [place] [facility];  Service: General;  Laterality: Right;  Right mediport placement  . RECONSTRUCTION BREAST IMMEDIATE / DELAYED W/ TISSUE EXPANDER  10/13   left  . REDUCTION MAMMAPLASTY Right 2013  . SIMPLE MASTECTOMY  09/01/2011   left  . TONSILLECTOMY      Her Family History Is Significant For: Family History  Problem Relation Age of Onset  . Brain cancer Father   . Heart attack Mother   . Breast cancer Sister 60  . Breast cancer Sister 27    Her Social History Is Significant For: Social History   Socioeconomic History  . Marital status: Widowed    Spouse name: None  . Number of children: 2  . Years of education: 54  . Highest education level: None  Social Needs  . Financial resource strain: None  . Food insecurity - worry: None  . Food insecurity - inability: None  . Transportation needs - medical: None  . Transportation needs - non-medical: None   Occupational History  . Occupation: Retired  Tobacco Use  . Smoking status: Never Smoker  . Smokeless tobacco: Never Used  Substance and Sexual Activity  . Alcohol use: Yes    Alcohol/week: 1.2 oz    Types: 2 Glasses of wine per week    Comment: 8 oz occasionally per week  . Drug use: No  . Sexual activity: Yes    Partners: Male    Birth control/protection: Post-menopausal  Other Topics Concern  . None  Social History Narrative   Daughter [place], [place]    Her Allergies Are:  Allergies  Allergen Reactions  . Dust Mite Extract Itching  . Other     grass  :   Her Current Medications Are:  Outpatient Encounter Medications as of 09/02/2017  Medication Sig  . acetaminophen (TYLENOL) 500 MG tablet Take 500 mg by mouth every 6 (six) hours as needed.  Marland Kitchen atorvastatin (LIPITOR) 10 MG tablet Take 10 mg by mouth daily.    . calcium carbonate (OS-CAL) 600 MG TABS Take 600 mg by mouth 2 (two) times daily with a meal.    . Carboxymethylcellulose Sodium (REFRESH OP) Apply 1 drop to eye 2 (two) times daily as needed. For dry eyes  . diazepam (VALIUM) 2 MG tablet   . fish oil-omega-3 fatty acids 1000 MG capsule Take 2 g by mouth daily.  Marland Kitchen loratadine (CLARITIN) 10 MG tablet Take 10 mg by mouth daily.  Marland Kitchen losartan-hydrochlorothiazide (HYZAAR) 100-25 MG tablet Take 1 tablet by mouth daily.  . metoprolol succinate (TOPROL-XL) 25 MG 24 hr tablet Take 25 mg by mouth daily.  . predniSONE (STERAPRED UNI-PAK 21 TAB) 10 MG (21) TBPK tablet Taper 6,5,4,3,2,1  . Triamcinolone Acetonide (TRIAMCINOLONE 0.1 % CREAM : EUCERIN) CREA Apply 1 application topically 2 (two) times daily as needed.  . [DISCONTINUED] gabapentin (NEURONTIN) 100 MG capsule Take 1 pill at bedtime as needed for leg pain.  . [DISCONTINUED] losartan-hydrochlorothiazide (HYZAAR) 50-12.5 MG per tablet 1 tablet daily.   . [DISCONTINUED] MICARDIS HCT 40-12.5 MG per tablet Take 1 tablet by mouth Daily.   No facility-administered encounter  medications on file as of 09/02/2017.   :  Review of Systems:  Out of a complete 14 point review of systems, all are reviewed and negative with the exception of these symptoms  as listed below:   Review of Systems  Neurological:       Pt presents today to discuss her legs. Pt has noticed a "cold" feeling on her legs at night but when she touches her legs, they don't feel cold. Pt stopped taking the gabapentin.    Objective:  Neurological Exam  Physical Exam Physical Examination:   Vitals:   09/02/17 0938  BP: (!) 151/87  Pulse: 77   General Examination: The patient is a very pleasant 67 y.o. female in no acute distress. She appears well-developed and well-nourished and well groomed. Good spirits, but initially mildly anxious.   HEENT: Normocephalic, atraumatic, pupils are equal, round and reactive to light and accommodation. Extraocular tracking is good without limitation to gaze excursion or nystagmus noted. Normal smooth pursuit is noted. Hearing is grossly intact. Face is symmetric with normal facial animation and normal facial sensation. Speech is clear with no dysarthria noted. There is no hypophonia. There is no lip, neck/head, jaw or voice tremor. Neck is supple with full range of passive and active motion. There are no carotid bruits on auscultation. Oropharynx exam reveals: mild mouth dryness, good dental hygiene and mild airway crowding. Tonsils are absent. Mallampati is class II. Tongue protrudes centrally and palate elevates symmetrically.   Chest: Clear to auscultation without wheezing, rhonchi or crackles noted.  Heart: S1+S2+0, regular and normal without murmurs, rubs or gallops noted. Perhaps a transient systolic flow murmur noted.   Abdomen: Soft, non-tender and non-distended with normal bowel sounds appreciated on auscultation.  Extremities: There is no pitting edema in the distal lower extremities bilaterally.   Skin: Warm and dry without trophic changes noted.  There are no varicose veins.  Musculoskeletal: exam reveals no obvious joint deformities, tenderness or joint swelling or erythema.   Neurologically:  Mental status: The patient is awake, alert and oriented in all 4 spheres. Her immediate and remote memory, attention, language skills and fund of knowledge are appropriate. There is no evidence of aphasia, agnosia, apraxia or anomia. Speech is clear with normal prosody and enunciation. Thought process is linear. Mood is normal and affect is normal.  Cranial nerves II - XII are as described above under HEENT exam. In addition: shoulder shrug is normal with equal shoulder height noted. Motor exam: Normal bulk, strength and tone is noted. There is no drift, tremor or rebound. Romberg is negative. Reflexes are 1-2+ throughout. Fine motor skills and coordination: grossly intact.  Cerebellar testing: No dysmetria or intention tremor. There is no truncal or gait ataxia.  Sensory exam: intact to light touch in the upper and lower extremities.  Gait, station and balance: She stands easily. No veering to one side is noted. No leaning to one side is noted. Posture is age-appropriate and stance is narrow based. Gait shows normal stride length and normal pace. No problems turning are noted. Tandem walk is challenging for her.  Assessment and Plan:   In summary, Martha Pham is a very pleasant 67 year old female with an underlying medical history of hyperlipidemia, hypertension, vitamin D deficiency, chronic kidney disease, low back pain and obesity, who presents for follow-up consultation of her restless leg symptoms and leg pains, associated with PLMs. She has a prior diagnosis of mild OSA in 2011, but her most sleep study from 11/06/2014 did not show any significant obstructive sleep apnea. She had some REM related sleep disordered breathing but no significant desaturations. She did have PLMs with some associated arousals, and endorsed leg pain  and discomfort  at night. She tried gabapentin for this with some success, she would be interested in restarting gabapentin. I suggested we restart at 100 mg strength and she can take it at night, she can take it nightly or as needed. I provided a 90 day prescription with refills. Physical exam is stable. She is encouraged to try to lose weight and hydrated better with water. I can see her back in one year, I answered all her questions today and she was in agreement.  I spent 25 minutes in total face-to-face time with the patient, more than 50% of which was spent in counseling and coordination of care, reviewing test results, reviewing medication and discussing or reviewing the diagnosis of PLMs, the prognosis and treatment options. Pertinent laboratory and imaging test results that were available during this visit with the patient were reviewed by me and considered in my medical decision making (see chart for details).

## 2017-09-02 NOTE — Patient Instructions (Addendum)
We will restart your low dose gabapentin 100 mg at night for your leg discomfort.   Your exam exam. I think you will benefit from losing a little bit of weight.   I will see you back in one year routinely.

## 2017-09-25 ENCOUNTER — Encounter: Payer: Self-pay | Admitting: Adult Health

## 2017-09-25 ENCOUNTER — Ambulatory Visit (HOSPITAL_COMMUNITY)
Admission: RE | Admit: 2017-09-25 | Discharge: 2017-09-25 | Disposition: A | Discharge status: Home / Self Care | Payer: PPO | Source: Ambulatory Visit | Attending: Adult Health | Admitting: Adult Health

## 2017-09-25 ENCOUNTER — Inpatient Hospital Stay: Payer: PPO | Attending: Adult Health | Admitting: Adult Health

## 2017-09-25 ENCOUNTER — Telehealth: Payer: Self-pay | Admitting: Adult Health

## 2017-09-25 VITALS — BP 160/100 | HR 76 | Temp 98.7°F | Resp 18 | Ht 60.0 in | Wt 174.6 lb

## 2017-09-25 DIAGNOSIS — I129 Hypertensive chronic kidney disease with stage 1 through stage 4 chronic kidney disease, or unspecified chronic kidney disease: Secondary | ICD-10-CM | POA: Insufficient documentation

## 2017-09-25 DIAGNOSIS — N189 Chronic kidney disease, unspecified: Secondary | ICD-10-CM | POA: Diagnosis not present

## 2017-09-25 DIAGNOSIS — M542 Cervicalgia: Secondary | ICD-10-CM

## 2017-09-25 DIAGNOSIS — M25511 Pain in right shoulder: Secondary | ICD-10-CM | POA: Diagnosis not present

## 2017-09-25 DIAGNOSIS — M5031 Other cervical disc degeneration,  high cervical region: Secondary | ICD-10-CM | POA: Insufficient documentation

## 2017-09-25 DIAGNOSIS — Z79899 Other long term (current) drug therapy: Secondary | ICD-10-CM

## 2017-09-25 DIAGNOSIS — G8929 Other chronic pain: Secondary | ICD-10-CM

## 2017-09-25 DIAGNOSIS — Z170 Estrogen receptor positive status [ER+]: Secondary | ICD-10-CM

## 2017-09-25 DIAGNOSIS — Z9221 Personal history of antineoplastic chemotherapy: Secondary | ICD-10-CM

## 2017-09-25 DIAGNOSIS — Z803 Family history of malignant neoplasm of breast: Secondary | ICD-10-CM

## 2017-09-25 DIAGNOSIS — Z853 Personal history of malignant neoplasm of breast: Secondary | ICD-10-CM | POA: Insufficient documentation

## 2017-09-25 DIAGNOSIS — Z808 Family history of malignant neoplasm of other organs or systems: Secondary | ICD-10-CM

## 2017-09-25 DIAGNOSIS — Z171 Estrogen receptor negative status [ER-]: Secondary | ICD-10-CM | POA: Diagnosis not present

## 2017-09-25 DIAGNOSIS — C50212 Malignant neoplasm of upper-inner quadrant of left female breast: Secondary | ICD-10-CM

## 2017-09-25 DIAGNOSIS — Z9012 Acquired absence of left breast and nipple: Secondary | ICD-10-CM | POA: Diagnosis not present

## 2017-09-25 DIAGNOSIS — M19011 Primary osteoarthritis, right shoulder: Secondary | ICD-10-CM | POA: Diagnosis not present

## 2017-09-25 DIAGNOSIS — Z1239 Encounter for other screening for malignant neoplasm of breast: Secondary | ICD-10-CM

## 2017-09-25 DIAGNOSIS — G4730 Sleep apnea, unspecified: Secondary | ICD-10-CM | POA: Insufficient documentation

## 2017-09-25 NOTE — Telephone Encounter (Signed)
Appointment scheduled Letter/Calendar mailed to patient per 3/8 los

## 2017-09-25 NOTE — Progress Notes (Signed)
CLINIC:  Survivorship   REASON FOR VISIT:  Routine follow-up for history of breast cancer.   BRIEF ONCOLOGIC HISTORY:    Breast cancer of upper-inner quadrant of left female breast ([place])   06/12/2011 Initial Diagnosis    Cancer of upper-inner quadrant of female breast: Grade 3 IDC ER negative PR negative HER-2 negative, MRI 2.7 x 2.3 x 1 cm mass      06/24/2011 Surgery    Left breast lumpectomy 2.2 cm IDC grade 3 with high-grade DCIS triple negative, for positive margins patient had reexcision: 2 lymph nodes negative ER/PR negative HER-2 negative ratio 0.97 Ki-67 75%      09/02/2011 Surgery    Left mastectomy      10/01/2011 - 12/03/2011 Chemotherapy    Adjuvant Taxotere and Cytoxan x4 cycles      07/05/2012 Surgery    Breast reconstruction on the left breast with reduction of right breast        INTERVAL HISTORY:  Martha Pham presents to the [facility] today for routine follow-up for her history of breast cancer.  Overall, she reports feeling quite well. Myriah has pain in her shoulder.  She was having breast sensitivity in her breast and we did MRI's twice, she underwent a biopsy, and it ended up being negative.  The pain is located in her shoulder and under her arm.  She moves the arm constantly to try and minimize the pain.  She has retained her ROM of the shoulder.  The pain is not worsened.  She has not seen anyone else about her shoulder pain, or mentioned it to them in her f/u appointments. She continues to f/u with her PCP regularly.  She also sees cardiology and neurology as scheduled.      REVIEW OF SYSTEMS:  Review of Systems  Constitutional: Negative for appetite change, chills, fatigue, fever and unexpected weight change.  HENT:   Negative for hearing loss and lump/mass.   Eyes: Negative for eye problems and icterus.  Respiratory: Negative for chest tightness, cough and shortness of breath.   Cardiovascular: Negative for chest pain, leg swelling and  palpitations.  Gastrointestinal: Negative for abdominal distention, abdominal pain, constipation, diarrhea, nausea and vomiting.  Endocrine: Negative for hot flashes.  Musculoskeletal: Negative for arthralgias.  Skin: Negative for itching and rash.  Neurological: Negative for dizziness, extremity weakness and headaches.  Hematological: Negative for adenopathy. Does not bruise/bleed easily.  Psychiatric/Behavioral: Negative for depression. The patient is not nervous/anxious.   Breast: Denies any new nodularity, masses, tenderness, nipple changes, or nipple discharge.     PAST MEDICAL/SURGICAL HISTORY:  Past Medical History:  Diagnosis Date  . Apnea, sleep    does wear cpap  . Arthritis   . Cancer ([place])    lt. breast ca-snbx  . Chronic kidney disease   . Contact lens/glasses fitting    wears contacts or glasses  . Heart murmur   . Hypercholesteremia   . Hyperlipemia   . Hypertension   . Lower back pain   . Malaise and fatigue   . Personal history of chemotherapy 2013  . Sleep apnea    SLEEP STUDY DX 10,CPAP BUT DON'TUSE  . Vitamin D deficiency   . Wears dentures    upper   Past Surgical History:  Procedure Laterality Date  . BREAST BIOPSY Left 05/26/2011   malignant  . BREAST BIOPSY Right 10/29/2016  . BREAST IMPLANT EXCHANGE Left 09/23/2012   Procedure: REMOVAL OF LEFT EXPANDER/PLACEMENT OF IMPLANT WITH REDUCTION  OF RIGHT BREAST;  Surgeon: Theodoro Kos, DO;  Location: [facility];  Service: Plastics;  Laterality: Left;  . BREAST LUMPECTOMY W/ NEEDLE LOCALIZATION  06/24/2011   left  . BREAST REDUCTION SURGERY Right 09/23/2012   Procedure: MAMMARY REDUCTION  (BREAST);  Surgeon: Theodoro Kos, DO;  Location: [place] [facility];  Service: Plastics;  Laterality: Right;  . BREAST SURGERY  08/07/2011   re-exc. left breast margins  . CYST REMOVAL NECK  08/2013   Dr. Migdalia Dk  . LIPOSUCTION Bilateral 09/23/2012   Procedure: LIPOSUCTION;  Surgeon: Theodoro Kos,  DO;  Location: [place];  Service: Plastics;  Laterality: Bilateral;  . MASTECTOMY Left 2012  . MOUTH SURGERY     TOOTH IMPLANT BOTTOM X2 LFT  . PORT-A-CATH REMOVAL Right 09/23/2012   Procedure: REMOVAL PORT-A-CATH;  Surgeon: Theodoro Kos, DO;  Location: [facility];  Service: Plastics;  Laterality: Right;  . PORTACATH PLACEMENT  06/24/2011   Procedure: INSERTION PORT-A-CATH;  Surgeon: Judieth Keens, DO;  Location: [facility];  Service: General;  Laterality: Right;  Right mediport placement  . RECONSTRUCTION BREAST IMMEDIATE / DELAYED W/ TISSUE EXPANDER  10/13   left  . REDUCTION MAMMAPLASTY Right 2013  . SIMPLE MASTECTOMY  09/01/2011   left  . TONSILLECTOMY       ALLERGIES:  Allergies  Allergen Reactions  . Dust Mite Extract Itching  . Other     grass     CURRENT MEDICATIONS:  Outpatient Encounter Medications as of 09/25/2017  Medication Sig  . acetaminophen (TYLENOL) 500 MG tablet Take 500 mg by mouth every 6 (six) hours as needed.  Marland Kitchen atorvastatin (LIPITOR) 10 MG tablet Take 10 mg by mouth daily.    . calcium carbonate (OS-CAL) 600 MG TABS Take 600 mg by mouth 2 (two) times daily with a meal.    . Carboxymethylcellulose Sodium (REFRESH OP) Apply 1 drop to eye 2 (two) times daily as needed. For dry eyes  . diazepam (VALIUM) 2 MG tablet   . fish oil-omega-3 fatty acids 1000 MG capsule Take 2 g by mouth daily.  Marland Kitchen gabapentin (NEURONTIN) 100 MG capsule Take 1 pill at bedtime as needed for leg pain.  Marland Kitchen loratadine (CLARITIN) 10 MG tablet Take 10 mg by mouth daily.  Marland Kitchen losartan-hydrochlorothiazide (HYZAAR) 100-25 MG tablet Take 1 tablet by mouth daily.  . metoprolol succinate (TOPROL-XL) 25 MG 24 hr tablet Take 25 mg by mouth daily.  . predniSONE (STERAPRED UNI-PAK 21 TAB) 10 MG (21) TBPK tablet Taper 6,5,4,3,2,1  . Triamcinolone Acetonide (TRIAMCINOLONE 0.1 % CREAM : EUCERIN) CREA Apply 1 application topically 2 (two) times daily as  needed.  . [DISCONTINUED] MICARDIS HCT 40-12.5 MG per tablet Take 1 tablet by mouth Daily.   No facility-administered encounter medications on file as of 09/25/2017.      ONCOLOGIC FAMILY HISTORY:  Family History  Problem Relation Age of Onset  . Brain cancer Father   . Heart attack Mother   . Breast cancer Sister 37  . Breast cancer Sister 53    GENETIC COUNSELING/TESTING: Done at [facility], negative  SOCIAL HISTORY:  ASHAKI FROSCH is single and lives alone in [place], [place].  Ms. Shawgo is currently retired.  She denies any current or history of tobacco, alcohol, or illicit drug use.     PHYSICAL EXAMINATION:  Vital Signs: Vitals:   09/25/17 0944  BP: (!) 160/100  Pulse: 76  Resp: 18  Temp: 98.7 F (  37.1 C)  SpO2: 100%   Filed Weights   09/25/17 0944  Weight: 174 lb 9.6 oz (79.2 kg)   General: Well-nourished, well-appearing female in no acute distress.  Unaccompanied today.   HEENT: Head is normocephalic.  Pupils equal and reactive to light. Conjunctivae clear without exudate.  Sclerae anicteric. Oral mucosa is pink, moist.  Oropharynx is pink without lesions or erythema.  Lymph: No cervical, supraclavicular, or infraclavicular lymphadenopathy noted on palpation.  Cardiovascular: Regular rate and rhythm.Marland Kitchen Respiratory: Clear to auscultation bilaterally. Chest expansion symmetric; breathing non-labored.  Breast Exam:  -Left breast: No appreciable masses on palpation. No skin redness, thickening, or peau d'orange appearance; no nipple retraction or nipple discharge; mild distortion in symmetry at previous lumpectomy site well healed scar without erythema or nodularity.  -Right breast: No appreciable masses on palpation. No skin redness, thickening, or peau d'orange appearance; no nipple retraction or nipple discharge; -Axilla: No axillary adenopathy bilaterally.  GI: Abdomen soft and round; non-tender, non-distended. Bowel sounds normoactive. No hepatosplenomegaly.    GU: Deferred.  Neuro: No focal deficits. Steady gait. + tenderness to right rotator cuff Psych: Mood and affect normal and appropriate for situation.  MSK: No focal spinal tenderness to palpation, full range of motion in bilateral upper extremities Extremities: No edema. Skin: Warm and dry.  LABORATORY DATA:  None for this visit   DIAGNOSTIC IMAGING:  Most recent mammogram:      ASSESSMENT AND PLAN:  Ms.. Hem is a pleasant 67 y.o. female with history of Stage IIA left breast invasive ductal carcinoma, ER-/PR-/HER2-, diagnosed in 05/2011, treated with lumpectomy followed by mastectomy, and adjuvant chemotherapy.  She [facility] for surveillance and routine follow-up.   1. History of breast cancer:  Ms. Ransier is currently clinically and radiographically without evidence of disease or recurrence of breast cancer. She will be due for mammogram in 06/30/2018; orders placed today.  We will see her back in one year for LTS surveillance and monitoring.  I encouraged her to call me with any questions or concerns before her next visit at the cancer center, and I would be happy to see her sooner, if needed.    2. Right shoulder pain: due to this pain, and the extension of it, it seems like it may originate in her neck.  I ordered plain films of her cervical spine and right shoulder to be done today.  She may need ortho referral.  I suggested she take anti inflammatories for her shoulder, however she thinks she's been told not to take these previously.  I recommended she discuss with her PCP.    3. Bone health:  Given Ms. Baskins age, history of breast cancer, she is at risk for bone demineralization. She thinks she may have underwent testing, but isn't 100% sure.  She will have to ask her PCP.   She was given education on specific food and activities to promote bone health.  4. Cancer screening:  Due to Ms. Halderman's history and her age, she should receive screening for skin  cancers, colon cancer, and gynecologic cancers. She was encouraged to follow-up with her PCP for appropriate cancer screenings.   5. Health maintenance and wellness promotion: Ms. Masek was encouraged to consume 5-7 servings of fruits and vegetables per day. She was also encouraged to engage in moderate to vigorous exercise for 30 minutes per day most days of the week. She was instructed to limit her alcohol consumption and continue to abstain from tobacco use.  Dispo:  -Return to cancer center in one year for LTS follow up -Mammogram in 06/2018 when due -Xrays today   A total of (30) minutes of face-to-face time was spent with this patient with greater than 50% of that time in counseling and care-coordination.   Gardenia Phlegm, NP Survivorship Program [place] [phone]   Note: PRIMARY CARE PROVIDER [place], [place] [phone]

## 2017-09-30 ENCOUNTER — Telehealth: Payer: Self-pay

## 2017-09-30 NOTE — Telephone Encounter (Signed)
Per Wilber Bihari, notified pt via phone of "Please call patient and let her know that the xray of her neck and shoulder show degeneration. She should review with her pcp regarding if she can take any anti inflammatories, or if an ortho referral would be more appropriate. " Pt voiced understanding and she would like a copy of xray sent to her primary care doctor:  Dr. Bryon Lions in [place],  will fax today.  No other needs per pt at this time.

## 2018-02-26 ENCOUNTER — Emergency Department (HOSPITAL_COMMUNITY): Payer: PPO

## 2018-02-26 ENCOUNTER — Emergency Department (HOSPITAL_COMMUNITY)
Admission: EM | Admit: 2018-02-26 | Discharge: 2018-02-26 | Disposition: A | Discharge status: Home / Self Care | Payer: PPO | Attending: Emergency Medicine | Admitting: Emergency Medicine

## 2018-02-26 ENCOUNTER — Encounter (HOSPITAL_COMMUNITY): Payer: Self-pay | Admitting: Emergency Medicine

## 2018-02-26 DIAGNOSIS — Z79899 Other long term (current) drug therapy: Secondary | ICD-10-CM | POA: Diagnosis not present

## 2018-02-26 DIAGNOSIS — I129 Hypertensive chronic kidney disease with stage 1 through stage 4 chronic kidney disease, or unspecified chronic kidney disease: Secondary | ICD-10-CM | POA: Diagnosis not present

## 2018-02-26 DIAGNOSIS — N189 Chronic kidney disease, unspecified: Secondary | ICD-10-CM | POA: Insufficient documentation

## 2018-02-26 DIAGNOSIS — M25512 Pain in left shoulder: Secondary | ICD-10-CM | POA: Insufficient documentation

## 2018-02-26 LAB — CBC WITH DIFFERENTIAL/PLATELET
Basophils Absolute: 0 10*3/uL (ref 0.0–0.1)
Basophils Relative: 0 %
EOS ABS: 0 10*3/uL (ref 0.0–0.7)
Eosinophils Relative: 0 %
HEMATOCRIT: 37.4 % (ref 36.0–46.0)
HEMOGLOBIN: 12.7 g/dL (ref 12.0–15.0)
LYMPHS ABS: 1.6 10*3/uL (ref 0.7–4.0)
LYMPHS PCT: 23 %
MCH: 33.3 pg (ref 26.0–34.0)
MCHC: 34 g/dL (ref 30.0–36.0)
MCV: 98.2 fL (ref 78.0–100.0)
MONOS PCT: 8 %
Monocytes Absolute: 0.6 10*3/uL (ref 0.1–1.0)
NEUTROS ABS: 4.7 10*3/uL (ref 1.7–7.7)
NEUTROS PCT: 69 %
Platelets: 206 10*3/uL (ref 150–400)
RBC: 3.81 MIL/uL — AB (ref 3.87–5.11)
RDW: 11.8 % (ref 11.5–15.5)
WBC: 6.9 10*3/uL (ref 4.0–10.5)

## 2018-02-26 LAB — SEDIMENTATION RATE: Sed Rate: 18 mm/hr (ref 0–22)

## 2018-02-26 LAB — BASIC METABOLIC PANEL
Anion gap: 8 (ref 5–15)
BUN: 10 mg/dL (ref 8–23)
CHLORIDE: 105 mmol/L (ref 98–111)
CO2: 27 mmol/L (ref 22–32)
CREATININE: 0.8 mg/dL (ref 0.44–1.00)
Calcium: 9.6 mg/dL (ref 8.9–10.3)
GFR calc Af Amer: >60 mL/min (ref >60)
GFR calc non Af Amer: >60 mL/min (ref >60)
Glucose, Bld: 135 mg/dL — ABNORMAL HIGH (ref 70–99)
Potassium: 3.8 mmol/L (ref 3.5–5.1)
Sodium: 140 mmol/L (ref 135–145)

## 2018-02-26 MED ORDER — DIAZEPAM 2 MG PO TABS: 2.0000 mg | ORAL_TABLET | Freq: Four times a day (QID) | ORAL | 0 refills | Status: DC | PRN

## 2018-02-26 MED ORDER — OXYCODONE-ACETAMINOPHEN 5-325 MG PO TABS: 1.0000 | ORAL_TABLET | Freq: Once | ORAL | Status: DC

## 2018-02-26 MED ORDER — DIAZEPAM 5 MG PO TABS
5.0000 mg | ORAL_TABLET | Freq: Once | ORAL | Status: AC
  Administered 2018-02-26: 5 mg via ORAL
  Filled 2018-02-26: qty 1

## 2018-02-26 MED ORDER — OXYCODONE-ACETAMINOPHEN 5-325 MG PO TABS
2.0000 | ORAL_TABLET | Freq: Once | ORAL | Status: AC
Start: 2018-02-26 — End: 2018-02-26
  Administered 2018-02-26: 2 via ORAL
  Filled 2018-02-26: qty 2

## 2018-02-26 MED ORDER — HYDROCODONE-ACETAMINOPHEN 5-325 MG PO TABS: 2.0000 | ORAL_TABLET | ORAL | 0 refills | Status: DC | PRN

## 2018-02-26 MED ORDER — PREDNISONE 10 MG (21) PO TBPK: ORAL_TABLET | Freq: Every day | ORAL | 0 refills | Status: DC

## 2018-02-26 MED ORDER — DEXAMETHASONE SODIUM PHOSPHATE 10 MG/ML IJ SOLN
10.0000 mg | Freq: Once | INTRAMUSCULAR | Status: AC
  Administered 2018-02-26: 10 mg via INTRAMUSCULAR
  Filled 2018-02-26: qty 1

## 2018-02-26 MED ORDER — KETOROLAC TROMETHAMINE 60 MG/2ML IM SOLN: 30.0000 mg | Freq: Once | INTRAMUSCULAR | Status: DC

## 2018-02-26 NOTE — ED Provider Notes (Signed)
[facility] DEPT Provider Note   CSN: [id number] Arrival date & time: 02/26/18  1200     History   Chief Complaint Chief Complaint  Patient presents with  . Arm Pain    HPI Martha Pham is a 67 y.o. female.  67 year old female presents with acute onset of left shoulder pain which began yesterday.  Pain is localized to her left anterior shoulder and characterizes sharp and worse with any movement.  She has had no associated dyspnea or diaphoresis.  No chest pain or chest pressure.  No prior history of arthritis or joint pain.  No fever or chills.  Denies any elbow or wrist pain.  No treatment used prior to arrival.  Symptoms better with remaining still.     Past Medical History:  Diagnosis Date  . Apnea, sleep    does wear cpap  . Arthritis   . Cancer ([place])    lt. breast ca-snbx  . Chronic kidney disease   . Contact lens/glasses fitting    wears contacts or glasses  . Heart murmur   . Hypercholesteremia   . Hyperlipemia   . Hypertension   . Lower back pain   . Malaise and fatigue   . Personal history of chemotherapy 2013  . Sleep apnea    SLEEP STUDY DX 10,CPAP BUT DON'TUSE  . Vitamin D deficiency   . Wears dentures    upper    Patient Active Problem List   Diagnosis Date Noted  . Pain in joint, pelvic region and thigh 03/28/2014  . Acquired absence of breast and absent nipple 05/05/2012  . Breast cancer of upper-inner quadrant of left female breast ([place]) 06/12/2011    Past Surgical History:  Procedure Laterality Date  . BREAST BIOPSY Left 05/26/2011   malignant  . BREAST BIOPSY Right 10/29/2016  . BREAST IMPLANT EXCHANGE Left 09/23/2012   Procedure: REMOVAL OF LEFT EXPANDER/PLACEMENT OF IMPLANT WITH REDUCTION OF RIGHT BREAST;  Surgeon: Theodoro Kos, DO;  Location: [facility];  Service: Plastics;  Laterality: Left;  . BREAST LUMPECTOMY W/ NEEDLE LOCALIZATION  06/24/2011   left  . BREAST REDUCTION SURGERY Right  09/23/2012   Procedure: MAMMARY REDUCTION  (BREAST);  Surgeon: Theodoro Kos, DO;  Location: [facility];  Service: Plastics;  Laterality: Right;  . BREAST SURGERY  08/07/2011   re-exc. left breast margins  . CYST REMOVAL NECK  08/2013   Dr. Migdalia Dk  . LIPOSUCTION Bilateral 09/23/2012   Procedure: LIPOSUCTION;  Surgeon: Theodoro Kos, DO;  Location: [facility];  Service: Plastics;  Laterality: Bilateral;  . MASTECTOMY Left 2012  . MOUTH SURGERY     TOOTH IMPLANT BOTTOM X2 LFT  . PORT-A-CATH REMOVAL Right 09/23/2012   Procedure: REMOVAL PORT-A-CATH;  Surgeon: Theodoro Kos, DO;  Location: [facility];  Service: Plastics;  Laterality: Right;  . PORTACATH PLACEMENT  06/24/2011   Procedure: INSERTION PORT-A-CATH;  Surgeon: Judieth Keens, DO;  Location: [facility];  Service: General;  Laterality: Right;  Right mediport placement  . RECONSTRUCTION BREAST IMMEDIATE / DELAYED W/ TISSUE EXPANDER  10/13   left  . REDUCTION MAMMAPLASTY Right 2013  . SIMPLE MASTECTOMY  09/01/2011   left  . TONSILLECTOMY       OB History   None      Home Medications    Prior to Admission medications   Medication Sig Start Date End Date Taking? Authorizing Provider  atorvastatin (LIPITOR) 10 MG tablet  Take 10 mg by mouth daily.     Yes [provider]  fish oil-omega-3 fatty acids 1000 MG capsule Take 2 g by mouth every evening.    Yes [provider]  gabapentin (NEURONTIN) 100 MG capsule Take 1 pill at bedtime as needed for leg pain. 09/02/17  Yes Star Age, MD  hydrochlorothiazide (HYDRODIURIL) 12.5 MG tablet Take 12.5 mg by mouth daily. 02/10/18  Yes [provider]  losartan (COZAAR) 50 MG tablet Take 50 mg by mouth daily.   Yes [provider]  metoprolol succinate (TOPROL-XL) 25 MG 24 hr tablet Take 25 mg by mouth daily.   Yes [provider]  acetaminophen (TYLENOL) 500 MG tablet Take 500 mg by mouth  every 6 (six) hours as needed for moderate pain.     [provider]  calcium carbonate (OS-CAL) 600 MG TABS Take 600 mg by mouth 2 (two) times daily with a meal.      [provider]  Carboxymethylcellulose Sodium (REFRESH OP) Apply 1 drop to eye 2 (two) times daily as needed. For dry eyes    [provider]  predniSONE (STERAPRED UNI-PAK 21 TAB) 10 MG (21) TBPK tablet Taper 6,5,4,3,2,1 Patient not taking: Reported on 02/26/2018 09/26/16   Gardenia Phlegm, NP  Triamcinolone Acetonide (TRIAMCINOLONE 0.1 % CREAM : EUCERIN) CREA Apply 1 application topically 2 (two) times daily as needed. Patient not taking: Reported on 02/26/2018 09/26/16   Gardenia Phlegm, NP  MICARDIS HCT 40-12.5 MG per tablet Take 1 tablet by mouth Daily. 08/17/11 09/26/11  [provider]    Family History Family History  Problem Relation Age of Onset  . Brain cancer Father   . Heart attack Mother   . Breast cancer Sister 44  . Breast cancer Sister 43    Social History Social History   Tobacco Use  . Smoking status: Never Smoker  . Smokeless tobacco: Never Used  Substance Use Topics  . Alcohol use: Yes    Alcohol/week: 2.0 standard drinks    Types: 2 Glasses of wine per week    Comment: 8 oz occasionally per week  . Drug use: No     Allergies   Dust mite extract and Other   Review of Systems Review of Systems  All other systems reviewed and are negative.    Physical Exam Updated Vital Signs BP (!) 191/85 (BP Location: Left Arm)   Pulse 81   Temp 97.9 F (36.6 C) (Oral)   Resp 16   Ht 1.549 m (5\' 1" )   Wt 78 kg   SpO2 100%   BMI 32.50 kg/m   Physical Exam  Constitutional: She is oriented to person, place, and time. She appears well-developed and well-nourished.  Non-toxic appearance. No distress.  HENT:  Head: Normocephalic and atraumatic.  Eyes: Pupils are equal, round, and reactive to light. Conjunctivae, EOM and lids are normal.  Neck:  Normal range of motion. Neck supple. No tracheal deviation present. No thyroid mass present.  Cardiovascular: Normal rate, regular rhythm and normal heart sounds. Exam reveals no gallop.  No murmur heard. Pulmonary/Chest: Effort normal and breath sounds normal. No stridor. No respiratory distress. She has no decreased breath sounds. She has no wheezes. She has no rhonchi. She has no rales.  Abdominal: Soft. Normal appearance and bowel sounds are normal. She exhibits no distension. There is no tenderness. There is no rebound and no CVA tenderness.  Musculoskeletal: Normal range of motion. She exhibits no  edema.       Left shoulder: She exhibits tenderness and bony tenderness. She exhibits no swelling and no effusion.       Arms: Neurological: She is alert and oriented to person, place, and time. She has normal strength. No cranial nerve deficit or sensory deficit. GCS eye subscore is 4. GCS verbal subscore is 5. GCS motor subscore is 6.  Skin: Skin is warm and dry. No abrasion and no rash noted.  Psychiatric: She has a normal mood and affect. Her speech is normal and behavior is normal.  Nursing note and vitals reviewed.    ED Treatments / Results  Labs (all labs ordered are listed, but only abnormal results are displayed) Labs Reviewed  CBC WITH DIFFERENTIAL/PLATELET  BASIC METABOLIC PANEL  SEDIMENTATION RATE    EKG None  Radiology No results found.  Procedures Procedures (including critical care time)  Medications Ordered in ED Medications  ketorolac (TORADOL) injection 30 mg (has no administration in time range)  diazepam (VALIUM) tablet 5 mg (has no administration in time range)  oxyCODONE-acetaminophen (PERCOCET/ROXICET) 5-325 MG per tablet 1 tablet (has no administration in time range)     Initial Impression / Assessment and Plan / ED Course  I have reviewed the triage vital signs and the nursing notes.  Pertinent labs & imaging results that were available during my  care of the patient were reviewed by me and considered in my medical decision making (see chart for details).     Patient medicated for pain here and feels better.  She is pinpoint tender at her left AC joint.  Negative x-ray as well.  Labs are reassuring here including a negative sed rate.  Do not think that this represents septic joint.  No concern for this being ACS.  Will prescribe medications and return precautions given.  Final Clinical Impressions(s) / ED Diagnoses   Final diagnoses:  None    ED Discharge Orders    None       Lacretia Leigh, MD 02/26/18 1559

## 2018-02-26 NOTE — ED Triage Notes (Signed)
Pt reports suddenly had left arm pains that is worse in upper arm near joint esp with palpation. Denies and falls or injuries that could cause the pain. Reports that pain was severe last night when trying to sleep. Pt reports that pain runs up left side of neck to her left ear.

## 2018-03-03 DIAGNOSIS — N182 Chronic kidney disease, stage 2 (mild): Secondary | ICD-10-CM | POA: Diagnosis not present

## 2018-03-03 DIAGNOSIS — Z1389 Encounter for screening for other disorder: Secondary | ICD-10-CM | POA: Diagnosis not present

## 2018-03-03 DIAGNOSIS — I129 Hypertensive chronic kidney disease with stage 1 through stage 4 chronic kidney disease, or unspecified chronic kidney disease: Secondary | ICD-10-CM | POA: Diagnosis not present

## 2018-03-17 ENCOUNTER — Other Ambulatory Visit: Payer: Self-pay | Admitting: *Deleted

## 2018-03-17 NOTE — Patient Outreach (Signed)
[facility]) Care Management  03/17/2018  Martha Pham 06-06-1951 [id number]  Referral via HTA-Utilization Management -[place] [place]; Reason: Medication assistance, community resources, such as food pantries:  Telephone call to preferred number-[phone]-left HIPPA compliant voice mail requesting call back. Telephone call also made to alternate number  And message was left.  Plan: Geophysicist/field seismologist. Pt to be followed up in 2-4 business days.  Sherrin Daisy, RN BSN [place] Management Coordinator [facility] Care Management  [phone]

## 2018-03-17 NOTE — Patient Outreach (Signed)
[facility]) Care Management  03/17/2018  TAMRYN POPKO 07/09/1951 [id number]  Referral via HTA-Utilization Management -[facility]; Reason: Medication assistance, community resources, such as food pantries:  Received return call from patient who was advised of  [facility] care management services.  Patient voices that health care is under control. Voices that HTN is controlled and breast cancer has been in remission for 7 years. States she gets regular check ups and takes her herself to appointments. Voices she does not have any problems getting her medications and takes as instructed.   States major problem is finances and making ends meet. States she would appreciate information on food pantries that my be available. States she does not know if she qualifies for Medicaid or SNAP program.   States she also needs guidance with Advance Directives.   Patient consents to [facility] services.  Plan: Refer to Clinical Education officer, museum for [employer] and Regulatory affairs officer.  Sherrin Daisy, RN BSN [place] Management Coordinator [facility] Care Management  [phone]

## 2018-03-19 NOTE — Addendum Note (Signed)
Addended by: Dickie La on: 03/19/2018 09:11 AM   Modules accepted: Orders

## 2018-03-23 ENCOUNTER — Other Ambulatory Visit: Payer: Self-pay

## 2018-03-23 NOTE — Patient Outreach (Signed)
[facility]) Care Management  03/23/2018  SADIRA STANDARD 02-05-1951 [id number]  BSW attempted to contact the patient on today's date to conduct a community resource consult. Unfortunately, today's call was unsuccessful. BSW left the patient a HIPAA compliant voice message requesting a return call.   Plan: BSW will mail the patient an unsuccessful outreach letter. BSW will attempt the patient again within the next four business days.  Daneen Schick, BSW, CDP [facility] [phone]

## 2018-03-29 ENCOUNTER — Other Ambulatory Visit: Payer: Self-pay

## 2018-03-29 ENCOUNTER — Ambulatory Visit: Payer: Self-pay

## 2018-03-29 NOTE — Patient Outreach (Signed)
[facility]) Care Management  03/29/2018  Martha Pham 1951/02/22 [id number]  Unsuccessful outreach to the patient on today's date. BSW left the patient a HIPAA compliant voice message requesting a return call. BSW to call the patient for a third and final time within the next four business days.

## 2018-04-02 ENCOUNTER — Ambulatory Visit: Payer: Self-pay

## 2018-04-02 ENCOUNTER — Other Ambulatory Visit: Payer: Self-pay

## 2018-04-02 NOTE — Patient Outreach (Signed)
[facility]) Care Management  04/02/2018  Martha Pham 01-14-1951 [id number]  BSW placed a third and final outreach attempt to the patient on today's date in efforts to conduct a community resource consult. Unfortunately, today's outreach was also unsuccessful.  Plan: BSW will plan to perform a case closure on Monday 9/16 as that will be the tenth day following the first unsuccessful outreach.  Daneen Schick, BSW, CDP [facility] [phone]

## 2018-04-05 ENCOUNTER — Other Ambulatory Visit: Payer: Self-pay

## 2018-04-05 DIAGNOSIS — E559 Vitamin D deficiency, unspecified: Secondary | ICD-10-CM | POA: Diagnosis not present

## 2018-04-05 DIAGNOSIS — N182 Chronic kidney disease, stage 2 (mild): Secondary | ICD-10-CM | POA: Diagnosis not present

## 2018-04-05 DIAGNOSIS — Z0000 Encounter for general adult medical examination without abnormal findings: Secondary | ICD-10-CM | POA: Diagnosis not present

## 2018-04-05 DIAGNOSIS — I129 Hypertensive chronic kidney disease with stage 1 through stage 4 chronic kidney disease, or unspecified chronic kidney disease: Secondary | ICD-10-CM | POA: Diagnosis not present

## 2018-04-05 DIAGNOSIS — M79621 Pain in right upper arm: Secondary | ICD-10-CM | POA: Diagnosis not present

## 2018-04-05 DIAGNOSIS — R7309 Other abnormal glucose: Secondary | ICD-10-CM | POA: Diagnosis not present

## 2018-04-05 NOTE — Patient Outreach (Signed)
[facility]) Care Management  04/05/2018  KYLIAH DEANDA 1951-05-25 [id number]  BSW to perform a case closure on today's date due to an inability to establish contact with the patient. BSW has placed three unsuccessful call attempts to the patient within the last 10 business days as well as mailed the patient an unsuccessful outreach letter. Unfortunately, the patient has yet to respond to any of these outreach efforts.  Daneen Schick, BSW, CDP [facility] [phone]

## 2018-05-05 ENCOUNTER — Encounter: Payer: Self-pay | Admitting: Internal Medicine

## 2018-05-05 ENCOUNTER — Ambulatory Visit (INDEPENDENT_AMBULATORY_CARE_PROVIDER_SITE_OTHER): Payer: PPO | Admitting: Internal Medicine

## 2018-05-05 VITALS — BP 152/100 | HR 61 | Temp 98.3°F | Ht 61.0 in | Wt 174.6 lb

## 2018-05-05 DIAGNOSIS — I129 Hypertensive chronic kidney disease with stage 1 through stage 4 chronic kidney disease, or unspecified chronic kidney disease: Secondary | ICD-10-CM | POA: Diagnosis not present

## 2018-05-05 DIAGNOSIS — Z23 Encounter for immunization: Secondary | ICD-10-CM | POA: Diagnosis not present

## 2018-05-05 DIAGNOSIS — N182 Chronic kidney disease, stage 2 (mild): Secondary | ICD-10-CM | POA: Diagnosis not present

## 2018-05-05 MED ORDER — HYDROCHLOROTHIAZIDE 25 MG PO TABS: 25.0000 mg | ORAL_TABLET | Freq: Every day | ORAL | 1 refills | Status: DC

## 2018-05-05 NOTE — Patient Instructions (Signed)
DASH Eating Plan DASH stands for "Dietary Approaches to Stop Hypertension." The DASH eating plan is a healthy eating plan that has been shown to reduce high blood pressure (hypertension). It may also reduce your risk for type 2 diabetes, heart disease, and stroke. The DASH eating plan may also help with weight loss. What are tips for following this plan? General guidelines  Avoid eating more than 2,300 mg (milligrams) of salt (sodium) a day. If you have hypertension, you may need to reduce your sodium intake to 1,500 mg a day.  Limit alcohol intake to no more than 1 drink a day for nonpregnant women and 2 drinks a day for men. One drink equals 12 oz of beer, 5 oz of wine, or 1 oz of hard liquor.  Work with your health care provider to maintain a healthy body weight or to lose weight. Ask what an ideal weight is for you.  Get at least 30 minutes of exercise that causes your heart to beat faster (aerobic exercise) most days of the week. Activities may include walking, swimming, or biking.  Work with your health care provider or diet and nutrition specialist (dietitian) to adjust your eating plan to your individual calorie needs. Reading food labels  Check food labels for the amount of sodium per serving. Choose foods with less than 5 percent of the Daily Value of sodium. Generally, foods with less than 300 mg of sodium per serving fit into this eating plan.  To find whole grains, look for the word "whole" as the first word in the ingredient list. Shopping  Buy products labeled as "low-sodium" or "no salt added."  Buy fresh foods. Avoid canned foods and premade or frozen meals. Cooking  Avoid adding salt when cooking. Use salt-free seasonings or herbs instead of table salt or sea salt. Check with your health care provider or pharmacist before using salt substitutes.  Do not fry foods. Cook foods using healthy methods such as baking, boiling, grilling, and broiling instead.  Cook with  heart-healthy oils, such as olive, canola, soybean, or sunflower oil. Meal planning   Eat a balanced diet that includes: ? 5 or more servings of fruits and vegetables each day. At each meal, try to fill half of your plate with fruits and vegetables. ? Up to 6-8 servings of whole grains each day. ? Less than 6 oz of lean meat, poultry, or fish each day. A 3-oz serving of meat is about the same size as a deck of cards. One egg equals 1 oz. ? 2 servings of low-fat dairy each day. ? A serving of nuts, seeds, or beans 5 times each week. ? Heart-healthy fats. Healthy fats called Omega-3 fatty acids are found in foods such as flaxseeds and coldwater fish, like sardines, salmon, and mackerel.  Limit how much you eat of the following: ? Canned or prepackaged foods. ? Food that is high in trans fat, such as fried foods. ? Food that is high in saturated fat, such as fatty meat. ? Sweets, desserts, sugary drinks, and other foods with added sugar. ? Full-fat dairy products.  Do not salt foods before eating.  Try to eat at least 2 vegetarian meals each week.  Eat more home-cooked food and less restaurant, buffet, and fast food.  When eating at a restaurant, ask that your food be prepared with less salt or no salt, if possible. What foods are recommended? The items listed may not be a complete list. Talk with your dietitian about what   dietary choices are best for you. Grains Whole-grain or whole-wheat bread. Whole-grain or whole-wheat pasta. Brown rice. Oatmeal. Quinoa. Bulgur. Whole-grain and low-sodium cereals. Pita bread. Low-fat, low-sodium crackers. Whole-wheat flour tortillas. Vegetables Fresh or frozen vegetables (raw, steamed, roasted, or grilled). Low-sodium or reduced-sodium tomato and vegetable juice. Low-sodium or reduced-sodium tomato sauce and tomato paste. Low-sodium or reduced-sodium canned vegetables. Fruits All fresh, dried, or frozen fruit. Canned fruit in natural juice (without  added sugar). Meat and other protein foods Skinless chicken or turkey. Ground chicken or turkey. Pork with fat trimmed off. Fish and seafood. Egg whites. Dried beans, peas, or lentils. Unsalted nuts, nut butters, and seeds. Unsalted canned beans. Lean cuts of beef with fat trimmed off. Low-sodium, lean deli meat. Dairy Low-fat (1%) or fat-free (skim) milk. Fat-free, low-fat, or reduced-fat cheeses. Nonfat, low-sodium ricotta or cottage cheese. Low-fat or nonfat yogurt. Low-fat, low-sodium cheese. Fats and oils Soft margarine without trans fats. Vegetable oil. Low-fat, reduced-fat, or light mayonnaise and salad dressings (reduced-sodium). Canola, safflower, olive, soybean, and sunflower oils. Avocado. Seasoning and other foods Herbs. Spices. Seasoning mixes without salt. Unsalted popcorn and pretzels. Fat-free sweets. What foods are not recommended? The items listed may not be a complete list. Talk with your dietitian about what dietary choices are best for you. Grains Baked goods made with fat, such as croissants, muffins, or some breads. Dry pasta or rice meal packs. Vegetables Creamed or fried vegetables. Vegetables in a cheese sauce. Regular canned vegetables (not low-sodium or reduced-sodium). Regular canned tomato sauce and paste (not low-sodium or reduced-sodium). Regular tomato and vegetable juice (not low-sodium or reduced-sodium). Pickles. Olives. Fruits Canned fruit in a light or heavy syrup. Fried fruit. Fruit in cream or butter sauce. Meat and other protein foods Fatty cuts of meat. Ribs. Fried meat. Bacon. Sausage. Bologna and other processed lunch meats. Salami. Fatback. Hotdogs. Bratwurst. Salted nuts and seeds. Canned beans with added salt. Canned or smoked fish. Whole eggs or egg yolks. Chicken or turkey with skin. Dairy Whole or 2% milk, cream, and half-and-half. Whole or full-fat cream cheese. Whole-fat or sweetened yogurt. Full-fat cheese. Nondairy creamers. Whipped toppings.  Processed cheese and cheese spreads. Fats and oils Butter. Stick margarine. Lard. Shortening. Ghee. Bacon fat. Tropical oils, such as coconut, palm kernel, or palm oil. Seasoning and other foods Salted popcorn and pretzels. Onion salt, garlic salt, seasoned salt, table salt, and sea salt. Worcestershire sauce. Tartar sauce. Barbecue sauce. Teriyaki sauce. Soy sauce, including reduced-sodium. Steak sauce. Canned and packaged gravies. Fish sauce. Oyster sauce. Cocktail sauce. Horseradish that you find on the shelf. Ketchup. Mustard. Meat flavorings and tenderizers. Bouillon cubes. Hot sauce and Tabasco sauce. Premade or packaged marinades. Premade or packaged taco seasonings. Relishes. Regular salad dressings. Where to find more information:  National Heart, Lung, and Blood Institute: www.nhlbi.nih.gov  American Heart Association: www.heart.org Summary  The DASH eating plan is a healthy eating plan that has been shown to reduce high blood pressure (hypertension). It may also reduce your risk for type 2 diabetes, heart disease, and stroke.  With the DASH eating plan, you should limit salt (sodium) intake to 2,300 mg a day. If you have hypertension, you may need to reduce your sodium intake to 1,500 mg a day.  When on the DASH eating plan, aim to eat more fresh fruits and vegetables, whole grains, lean proteins, low-fat dairy, and heart-healthy fats.  Work with your health care provider or diet and nutrition specialist (dietitian) to adjust your eating plan to your individual   calorie needs. This information is not intended to replace advice given to you by your health care provider. Make sure you discuss any questions you have with your health care provider. Document Released: 06/26/2011 Document Revised: 06/30/2016 Document Reviewed: 06/30/2016 Elsevier Interactive Patient Education  2018 Elsevier Inc.  

## 2018-05-05 NOTE — Progress Notes (Addendum)
Subjective:     Patient ID: HYLA COARD , female    DOB: [birth year] , 67 y.o.   MRN: [id number]   Hypertension  This is a chronic problem. The current episode started more than 1 year ago. The problem has been gradually worsening since onset. The problem is uncontrolled. Risk factors for coronary artery disease include obesity, post-menopausal state and sedentary lifestyle. The current treatment provides mild improvement.     Past Medical History:  Diagnosis Date  . Apnea, sleep    does wear cpap  . Arthritis   . Cancer ([place])    lt. breast ca-snbx  . Chronic kidney disease   . Contact lens/glasses fitting    wears contacts or glasses  . Heart murmur   . Hypercholesteremia   . Hyperlipemia   . Hypertension   . Lower back pain   . Malaise and fatigue   . Personal history of chemotherapy 2013  . Sleep apnea    SLEEP STUDY DX 10,CPAP BUT DON'TUSE  . Vitamin D deficiency   . Wears dentures    upper      Current Outpatient Medications:  .  acetaminophen (TYLENOL) 500 MG tablet, Take 500 mg by mouth every 6 (six) hours as needed for moderate pain. , Disp: , Rfl:  .  atorvastatin (LIPITOR) 20 MG tablet, Take 20 mg by mouth daily., Disp: , Rfl:  .  Carboxymethylcellulose Sodium (REFRESH OP), Apply 1 drop to eye 2 (two) times daily as needed. For dry eyes, Disp: , Rfl:  .  diazepam (VALIUM) 2 MG tablet, Take 1 tablet (2 mg total) by mouth every 6 (six) hours as needed for muscle spasms., Disp: 20 tablet, Rfl: 0 .  fish oil-omega-3 fatty acids 1000 MG capsule, Take 2 g by mouth every evening. , Disp: , Rfl:  .  gabapentin (NEURONTIN) 100 MG capsule, Take 1 pill at bedtime as needed for leg pain., Disp: 90 capsule, Rfl: 3 .  losartan (COZAAR) 50 MG tablet, Take 100 mg by mouth daily. , Disp: , Rfl:  .  metoprolol succinate (TOPROL-XL) 25 MG 24 hr tablet, Take 25 mg by mouth daily., Disp: , Rfl:  .  Triamcinolone Acetonide (TRIAMCINOLONE 0.1 % CREAM : EUCERIN) CREA, Apply 1  application topically 2 (two) times daily as needed., Disp: 1 each, Rfl: 0 .  calcium carbonate (OS-CAL) 600 MG TABS, Take 600 mg by mouth 2 (two) times daily with a meal.  , Disp: , Rfl:  .  hydrochlorothiazide (HYDRODIURIL) 25 MG tablet, Take 1 tablet (25 mg total) by mouth daily., Disp: 30 tablet, Rfl: 1   Allergies  Allergen Reactions  . Dust Mite Extract Itching  . Other     grass     Review of Systems  Constitutional: Negative.   HENT: Negative.   Respiratory: Negative.   Cardiovascular: Negative.   Gastrointestinal: Negative.   Neurological: Negative.   Psychiatric/Behavioral: Negative.      Today's Vitals   05/05/18 1550  BP: (!) 152/100  Pulse: 61  Temp: 98.3 F (36.8 C)  TempSrc: Oral  Weight: 174 lb 9.6 oz (79.2 kg)  Height: 5\' 1"  (4.008 m)  PainSc: 6   PainLoc: Arm   Body mass index is 32.99 kg/m.   Objective:  Physical Exam  Constitutional: She is oriented to person, place, and time. She appears well-developed and well-nourished.  HENT:  Head: Normocephalic and atraumatic.  Eyes: EOM are normal.  Cardiovascular: Normal rate, regular rhythm and normal heart  sounds.  Pulmonary/Chest: Effort normal and breath sounds normal.  Neurological: She is alert and oriented to person, place, and time.  Psychiatric: She has a normal mood and affect.  Nursing note and vitals reviewed.       Assessment And Plan:     Benign hypertensive renal disease - Add hctz 25mg , recheck in four weeks. check bmet at next visit. will combine meds losartan/hctz 100/25 if effective.   Chronic renal disease, stage II - CHRONIC. I WILL RECHECK BMET AT NEXT VISIT.   Need for prophylactic vaccination and inoculation against influenza - SHE WAS GIVEN HIGH DOSE FLU VACCINE.      [name], MD

## 2018-05-13 ENCOUNTER — Ambulatory Visit: Payer: PPO | Admitting: Nurse Practitioner

## 2018-05-17 ENCOUNTER — Encounter: Payer: Self-pay | Admitting: Internal Medicine

## 2018-05-20 DIAGNOSIS — H02889 Meibomian gland dysfunction of unspecified eye, unspecified eyelid: Secondary | ICD-10-CM | POA: Diagnosis not present

## 2018-05-20 DIAGNOSIS — H5203 Hypermetropia, bilateral: Secondary | ICD-10-CM | POA: Diagnosis not present

## 2018-05-20 DIAGNOSIS — H16421 Pannus (corneal), right eye: Secondary | ICD-10-CM | POA: Diagnosis not present

## 2018-05-20 DIAGNOSIS — H524 Presbyopia: Secondary | ICD-10-CM | POA: Diagnosis not present

## 2018-05-20 DIAGNOSIS — H2513 Age-related nuclear cataract, bilateral: Secondary | ICD-10-CM | POA: Diagnosis not present

## 2018-05-20 DIAGNOSIS — E119 Type 2 diabetes mellitus without complications: Secondary | ICD-10-CM | POA: Diagnosis not present

## 2018-05-20 LAB — HM DIABETES EYE EXAM

## 2018-06-02 ENCOUNTER — Encounter: Payer: Self-pay | Admitting: Internal Medicine

## 2018-06-02 ENCOUNTER — Ambulatory Visit (INDEPENDENT_AMBULATORY_CARE_PROVIDER_SITE_OTHER): Payer: PPO | Admitting: Internal Medicine

## 2018-06-02 VITALS — BP 126/74 | HR 87 | Temp 97.1°F | Ht 61.0 in | Wt 163.6 lb

## 2018-06-02 DIAGNOSIS — N182 Chronic kidney disease, stage 2 (mild): Secondary | ICD-10-CM

## 2018-06-02 DIAGNOSIS — I129 Hypertensive chronic kidney disease with stage 1 through stage 4 chronic kidney disease, or unspecified chronic kidney disease: Secondary | ICD-10-CM

## 2018-06-02 DIAGNOSIS — Z7189 Other specified counseling: Secondary | ICD-10-CM

## 2018-06-02 DIAGNOSIS — E1122 Type 2 diabetes mellitus with diabetic chronic kidney disease: Secondary | ICD-10-CM | POA: Diagnosis not present

## 2018-06-02 MED ORDER — LOSARTAN POTASSIUM 100 MG PO TABS: 100.0000 mg | ORAL_TABLET | Freq: Every day | ORAL | 3 refills | Status: DC

## 2018-06-02 MED ORDER — HYDROCHLOROTHIAZIDE 25 MG PO TABS: 25.0000 mg | ORAL_TABLET | Freq: Every day | ORAL | 1 refills | Status: DC

## 2018-06-02 MED ORDER — METOPROLOL SUCCINATE ER 25 MG PO TB24: 25.0000 mg | ORAL_TABLET | Freq: Every day | ORAL | 1 refills | Status: DC

## 2018-06-03 LAB — BMP8+EGFR
BUN/Creatinine Ratio: 29 — ABNORMAL HIGH (ref 12–28)
BUN: 34 mg/dL — AB (ref 8–27)
CALCIUM: 10.4 mg/dL — AB (ref 8.7–10.3)
CHLORIDE: 95 mmol/L — AB (ref 96–106)
CO2: 21 mmol/L (ref 20–29)
CREATININE: 1.19 mg/dL — AB (ref 0.57–1.00)
GFR calc non Af Amer: 47 mL/min/{1.73_m2} — ABNORMAL LOW (ref >59)
GFR, EST AFRICAN AMERICAN: 54 mL/min/{1.73_m2} — AB (ref >59)
GLUCOSE: 100 mg/dL — AB (ref 65–99)
Potassium: 3 mmol/L — ABNORMAL LOW (ref 3.5–5.2)
Sodium: 136 mmol/L (ref 134–144)

## 2018-06-03 NOTE — Progress Notes (Signed)
Kidney fxn sl. Decreased. Be sure to stay well hydrated. Try to increase water intake. Calcium level is sl. Elevated, implies that you are dehydrated. I would like to recheck this in two weeks. Kw-pls schedule lab visit in 2 weeks. Dx: renal insufficiency.

## 2018-06-20 ENCOUNTER — Encounter: Payer: Self-pay | Admitting: Internal Medicine

## 2018-06-20 NOTE — Progress Notes (Signed)
Subjective:     Patient ID: Martha Pham , female    DOB: Feb 28, 1951 , 67 y.o.   MRN: [id number]   Chief Complaint  Patient presents with  . Hypertension  . Diabetes    HPI  Hypertension  This is a chronic problem. The current episode started more than 1 year ago. The problem has been gradually improving since onset. The problem is controlled. Risk factors for coronary artery disease include diabetes mellitus, dyslipidemia, post-menopausal state, obesity and sedentary lifestyle. The current treatment provides moderate improvement. Compliance problems include exercise.   Diabetes  She presents for her follow-up diabetic visit. She has type 2 diabetes mellitus. Her disease course has been improving. There are no hypoglycemic associated symptoms. There are no diabetic associated symptoms. There are no hypoglycemic complications.   SHE DOES NOT WISH TO TAKE RX MEDS.   Past Medical History:  Diagnosis Date  . Apnea, sleep    does wear cpap  . Arthritis   . Cancer ([place])    lt. breast ca-snbx  . Chronic kidney disease   . Contact lens/glasses fitting    wears contacts or glasses  . Heart murmur   . Hypercholesteremia   . Hyperlipemia   . Hypertension   . Lower back pain   . Malaise and fatigue   . Personal history of chemotherapy 2013  . Sleep apnea    SLEEP STUDY DX 10,CPAP BUT DON'TUSE  . Vitamin D deficiency   . Wears dentures    upper     Family History  Problem Relation Age of Onset  . Brain cancer Father   . Heart attack Mother   . Breast cancer Sister 48  . Breast cancer Sister 50     Current Outpatient Medications:  .  acetaminophen (TYLENOL) 500 MG tablet, Take 500 mg by mouth every 6 (six) hours as needed for moderate pain. , Disp: , Rfl:  .  atorvastatin (LIPITOR) 20 MG tablet, Take 20 mg by mouth daily., Disp: , Rfl:  .  calcium carbonate (OS-CAL) 600 MG TABS, Take 600 mg by mouth 2 (two) times daily with a meal.  , Disp: , Rfl:  .   Carboxymethylcellulose Sodium (REFRESH OP), Apply 1 drop to eye 2 (two) times daily as needed. For dry eyes, Disp: , Rfl:  .  diazepam (VALIUM) 2 MG tablet, Take 1 tablet (2 mg total) by mouth every 6 (six) hours as needed for muscle spasms., Disp: 20 tablet, Rfl: 0 .  fish oil-omega-3 fatty acids 1000 MG capsule, Take 2 g by mouth every evening. , Disp: , Rfl:  .  gabapentin (NEURONTIN) 100 MG capsule, Take 1 pill at bedtime as needed for leg pain., Disp: 90 capsule, Rfl: 3 .  hydrochlorothiazide (HYDRODIURIL) 25 MG tablet, Take 1 tablet (25 mg total) by mouth daily., Disp: 90 tablet, Rfl: 1 .  metoprolol succinate (TOPROL-XL) 25 MG 24 hr tablet, Take 1 tablet (25 mg total) by mouth daily., Disp: 90 tablet, Rfl: 1 .  losartan (COZAAR) 100 MG tablet, Take 1 tablet (100 mg total) by mouth daily., Disp: 90 tablet, Rfl: 3   Allergies  Allergen Reactions  . Dust Mite Extract Itching  . Other     grass     Review of Systems  Constitutional: Negative.   Respiratory: Negative.   Cardiovascular: Negative.   Gastrointestinal: Negative.   Neurological: Negative.   Psychiatric/Behavioral: Negative.      Today's Vitals   06/02/18 1525  BP: 126/74  Pulse: 87  Temp: (!) 97.1 F (36.2 C)  TempSrc: Oral  Weight: 163 lb 9.6 oz (74.2 kg)  Height: _0  (1.549 m)  PainSc: 6   PainLoc: Back   Body mass index is 30.91 kg/m.   Objective:  Physical Exam  Constitutional: She is oriented to person, place, and time. She appears well-developed and well-nourished.  HENT:  Head: Normocephalic and atraumatic.  Eyes: EOM are normal.  Cardiovascular: Normal rate, regular rhythm and normal heart sounds.  Pulmonary/Chest: Effort normal and breath sounds normal.  Neurological: She is alert and oriented to person, place, and time.  Psychiatric: She has a normal mood and affect.  Vitals reviewed.       Assessment And Plan:     1. Hypertensive nephropathy  WELL CONTROLLED. SHE WILL CONTINUE WITH  CURRENT MEDS. SHE WAS CONGRATULATED ON HER LIFESTYLE CHANGES AND ENCOURAGED TO KEEP UP THE GREAT WORK.   2. Chronic renal disease, stage II CHRONIC. I WILL CHECK BMET TODAY.   - BMP8+EGFR  3. Type 2 diabetes mellitus with stage 2 chronic kidney disease, without long-term current use of insulin ([facility])  NEWLY DIAGNOSED. SHE DECLINES TO TAKE MEDS AT THIS TIME. SHE WAS COUNSELED ON RISKS OF DM REMAINING UNTREATED. SHE HAS RECENTLY STARTED A WEIGHT LOSS PROGRAM AT [facility] AND HOPES THIS WILL HELP TO IMPROVE HER GLYCEMIC CONTROL. SHE IS ENCOURAGED TO AVOID SUGARY BEVERAGES INCLUDING SODAS, JUICES AND SWEET TEA. ALSO ENCOURAGED TO AVOID DIET DRINKS AND ARTIFICIAL SWEETENERS. SHE IS ADVISED TO INCORPORATE 30 MINUTES OF EXERCISE FOUR TO FIVE DAYS WEEKLY. ALL QUESTIONS WERE ANSWERED TO HER SATISFACTION. SHE HAS BEEN EVALUATED BY A NUTRITIONIST ALREADY. SHE WILL RTO IN 8-12 WEEKS FOR RE-EVALUATION.   [name], MD

## 2018-06-22 ENCOUNTER — Encounter: Payer: Self-pay | Admitting: Internal Medicine

## 2018-06-22 ENCOUNTER — Ambulatory Visit (INDEPENDENT_AMBULATORY_CARE_PROVIDER_SITE_OTHER): Payer: PPO | Admitting: Internal Medicine

## 2018-06-22 VITALS — BP 134/80 | HR 79 | Temp 98.0°F | Ht 61.0 in | Wt 158.0 lb

## 2018-06-22 DIAGNOSIS — Z79899 Other long term (current) drug therapy: Secondary | ICD-10-CM | POA: Diagnosis not present

## 2018-06-22 DIAGNOSIS — N182 Chronic kidney disease, stage 2 (mild): Secondary | ICD-10-CM | POA: Diagnosis not present

## 2018-06-22 DIAGNOSIS — I129 Hypertensive chronic kidney disease with stage 1 through stage 4 chronic kidney disease, or unspecified chronic kidney disease: Secondary | ICD-10-CM

## 2018-06-22 DIAGNOSIS — Z6829 Body mass index (BMI) 29.0-29.9, adult: Secondary | ICD-10-CM | POA: Diagnosis not present

## 2018-06-22 DIAGNOSIS — E1122 Type 2 diabetes mellitus with diabetic chronic kidney disease: Secondary | ICD-10-CM | POA: Diagnosis not present

## 2018-06-22 NOTE — Progress Notes (Signed)
Subjective:     Patient ID: Martha Pham , female    DOB: 1951/02/02 , 67 y.o.   MRN: [id number]   Chief Complaint  Patient presents with  . Diabetes    HPI  She is here today for f/u diabetes. She is not taking any rx meds at this time, per her request. She has been going to [facility] for weight loss program. She feels great while on the program. She adds that she has been exercising regularly as well.   Diabetes  She presents for her follow-up diabetic visit. She has type 2 diabetes mellitus. Her disease course has been improving. There are no hypoglycemic associated symptoms. Pertinent negatives for diabetes include no blurred vision, no chest pain and no fatigue. There are no hypoglycemic complications. Risk factors for coronary artery disease include diabetes mellitus, hypertension, obesity, post-menopausal and dyslipidemia.  Hypertension  This is a chronic problem. The current episode started more than 1 year ago. The problem has been gradually improving since onset. The problem is controlled. Pertinent negatives include no blurred vision, chest pain or shortness of breath.     Past Medical History:  Diagnosis Date  . Apnea, sleep    does wear cpap  . Arthritis   . Cancer ([place])    lt. breast ca-snbx  . Chronic kidney disease   . Contact lens/glasses fitting    wears contacts or glasses  . Heart murmur   . Hypercholesteremia   . Hyperlipemia   . Hypertension   . Lower back pain   . Malaise and fatigue   . Personal history of chemotherapy 2013  . Sleep apnea    SLEEP STUDY DX 10,CPAP BUT DON'TUSE  . Vitamin D deficiency   . Wears dentures    upper     Family History  Problem Relation Age of Onset  . Brain cancer Father   . Heart attack Mother   . Breast cancer Sister 36  . Breast cancer Sister 44     Current Outpatient Medications:  .  acetaminophen (TYLENOL) 500 MG tablet, Take 500 mg by mouth every 6 (six) hours as needed for moderate pain. ,  Disp: , Rfl:  .  atorvastatin (LIPITOR) 20 MG tablet, Take 20 mg by mouth daily., Disp: , Rfl:  .  Carboxymethylcellulose Sodium (REFRESH OP), Apply 1 drop to eye 2 (two) times daily as needed. For dry eyes, Disp: , Rfl:  .  diazepam (VALIUM) 2 MG tablet, Take 1 tablet (2 mg total) by mouth every 6 (six) hours as needed for muscle spasms., Disp: 20 tablet, Rfl: 0 .  fish oil-omega-3 fatty acids 1000 MG capsule, Take 2 g by mouth every evening. , Disp: , Rfl:  .  gabapentin (NEURONTIN) 100 MG capsule, Take 1 pill at bedtime as needed for leg pain., Disp: 90 capsule, Rfl: 3 .  hydrochlorothiazide (HYDRODIURIL) 25 MG tablet, Take 1 tablet (25 mg total) by mouth daily., Disp: 90 tablet, Rfl: 1 .  losartan (COZAAR) 100 MG tablet, Take 1 tablet (100 mg total) by mouth daily., Disp: 90 tablet, Rfl: 3 .  metoprolol succinate (TOPROL-XL) 25 MG 24 hr tablet, Take 1 tablet (25 mg total) by mouth daily., Disp: 90 tablet, Rfl: 1 .  Vitamin D, Cholecalciferol, 50 MCG (2000 UT) CAPS, Take by mouth., Disp: , Rfl:  .  calcium carbonate (OS-CAL) 600 MG TABS, Take 600 mg by mouth 2 (two) times daily with a meal.  , Disp: , Rfl:  Allergies  Allergen Reactions  . Dust Mite Extract Itching  . Other     grass     Review of Systems  Constitutional: Negative.  Negative for fatigue.  Eyes: Negative for blurred vision.  Respiratory: Negative.  Negative for shortness of breath.   Cardiovascular: Negative.  Negative for chest pain.  Gastrointestinal: Negative.   Neurological: Negative.   Psychiatric/Behavioral: Negative.      Today's Vitals   06/22/18 1453  BP: 134/80  Pulse: 79  Temp: 98 F (36.7 C)  TempSrc: Oral  Weight: 158 lb (71.7 kg)  Height: 5\' 1"  (1.549 m)  PainSc: 4   PainLoc: Rib Cage   Body mass index is 29.85 kg/m.   Objective:  Physical Exam  Constitutional: She is oriented to person, place, and time. She appears well-developed and well-nourished.  HENT:  Head: Normocephalic and  atraumatic.  Eyes: EOM are normal.  Cardiovascular: Normal rate, regular rhythm and normal heart sounds.  Pulmonary/Chest: Effort normal and breath sounds normal.  Neurological: She is alert and oriented to person, place, and time.  Psychiatric: She has a normal mood and affect.  Nursing note and vitals reviewed.       Assessment And Plan:     1. Diabetes mellitus with stage 2 chronic kidney disease ([place])  She was congratulated on her lifestyle changes thus far. She is encouraged to keep up the great work.   - Hemoglobin A1c  2. Chronic renal disease, stage II  Chronic. She is encouraged to stay well hydrated. I will recheck her GFR, Cr in 3 months at her next visit.   3. Hypertensive nephropathy  Well controlled. She will continue with current meds. She is encouraged to avoid adding salt to her foods.   4. Drug therapy   5. Adult BMI 29.0-29.9 kg/sq m  She was congratulated on her 16 pound weight loss since Oct 2019. She is encouraged to strive for BMI 26 or less to decrease cardiac risk.   [name], MD

## 2018-06-22 NOTE — Patient Instructions (Signed)
DASH Eating Plan DASH stands for "Dietary Approaches to Stop Hypertension." The DASH eating plan is a healthy eating plan that has been shown to reduce high blood pressure (hypertension). It may also reduce your risk for type 2 diabetes, heart disease, and stroke. The DASH eating plan may also help with weight loss. What are tips for following this plan? General guidelines  Avoid eating more than 2,300 mg (milligrams) of salt (sodium) a day. If you have hypertension, you may need to reduce your sodium intake to 1,500 mg a day.  Limit alcohol intake to no more than 1 drink a day for nonpregnant women and 2 drinks a day for men. One drink equals 12 oz of beer, 5 oz of wine, or 1 oz of hard liquor.  Work with your health care provider to maintain a healthy body weight or to lose weight. Ask what an ideal weight is for you.  Get at least 30 minutes of exercise that causes your heart to beat faster (aerobic exercise) most days of the week. Activities may include walking, swimming, or biking.  Work with your health care provider or diet and nutrition specialist (dietitian) to adjust your eating plan to your individual calorie needs. Reading food labels  Check food labels for the amount of sodium per serving. Choose foods with less than 5 percent of the Daily Value of sodium. Generally, foods with less than 300 mg of sodium per serving fit into this eating plan.  To find whole grains, look for the word "whole" as the first word in the ingredient list. Shopping  Buy products labeled as "low-sodium" or "no salt added."  Buy fresh foods. Avoid canned foods and premade or frozen meals. Cooking  Avoid adding salt when cooking. Use salt-free seasonings or herbs instead of table salt or sea salt. Check with your health care provider or pharmacist before using salt substitutes.  Do not fry foods. Cook foods using healthy methods such as baking, boiling, grilling, and broiling instead.  Cook with  heart-healthy oils, such as olive, canola, soybean, or sunflower oil. Meal planning   Eat a balanced diet that includes: ? 5 or more servings of fruits and vegetables each day. At each meal, try to fill half of your plate with fruits and vegetables. ? Up to 6-8 servings of whole grains each day. ? Less than 6 oz of lean meat, poultry, or fish each day. A 3-oz serving of meat is about the same size as a deck of cards. One egg equals 1 oz. ? 2 servings of low-fat dairy each day. ? A serving of nuts, seeds, or beans 5 times each week. ? Heart-healthy fats. Healthy fats called Omega-3 fatty acids are found in foods such as flaxseeds and coldwater fish, like sardines, salmon, and mackerel.  Limit how much you eat of the following: ? Canned or prepackaged foods. ? Food that is high in trans fat, such as fried foods. ? Food that is high in saturated fat, such as fatty meat. ? Sweets, desserts, sugary drinks, and other foods with added sugar. ? Full-fat dairy products.  Do not salt foods before eating.  Try to eat at least 2 vegetarian meals each week.  Eat more home-cooked food and less restaurant, buffet, and fast food.  When eating at a restaurant, ask that your food be prepared with less salt or no salt, if possible. What foods are recommended? The items listed may not be a complete list. Talk with your dietitian about what   dietary choices are best for you. Grains Whole-grain or whole-wheat bread. Whole-grain or whole-wheat pasta. Brown rice. Oatmeal. Quinoa. Bulgur. Whole-grain and low-sodium cereals. Pita bread. Low-fat, low-sodium crackers. Whole-wheat flour tortillas. Vegetables Fresh or frozen vegetables (raw, steamed, roasted, or grilled). Low-sodium or reduced-sodium tomato and vegetable juice. Low-sodium or reduced-sodium tomato sauce and tomato paste. Low-sodium or reduced-sodium canned vegetables. Fruits All fresh, dried, or frozen fruit. Canned fruit in natural juice (without  added sugar). Meat and other protein foods Skinless chicken or turkey. Ground chicken or turkey. Pork with fat trimmed off. Fish and seafood. Egg whites. Dried beans, peas, or lentils. Unsalted nuts, nut butters, and seeds. Unsalted canned beans. Lean cuts of beef with fat trimmed off. Low-sodium, lean deli meat. Dairy Low-fat (1%) or fat-free (skim) milk. Fat-free, low-fat, or reduced-fat cheeses. Nonfat, low-sodium ricotta or cottage cheese. Low-fat or nonfat yogurt. Low-fat, low-sodium cheese. Fats and oils Soft margarine without trans fats. Vegetable oil. Low-fat, reduced-fat, or light mayonnaise and salad dressings (reduced-sodium). Canola, safflower, olive, soybean, and sunflower oils. Avocado. Seasoning and other foods Herbs. Spices. Seasoning mixes without salt. Unsalted popcorn and pretzels. Fat-free sweets. What foods are not recommended? The items listed may not be a complete list. Talk with your dietitian about what dietary choices are best for you. Grains Baked goods made with fat, such as croissants, muffins, or some breads. Dry pasta or rice meal packs. Vegetables Creamed or fried vegetables. Vegetables in a cheese sauce. Regular canned vegetables (not low-sodium or reduced-sodium). Regular canned tomato sauce and paste (not low-sodium or reduced-sodium). Regular tomato and vegetable juice (not low-sodium or reduced-sodium). Pickles. Olives. Fruits Canned fruit in a light or heavy syrup. Fried fruit. Fruit in cream or butter sauce. Meat and other protein foods Fatty cuts of meat. Ribs. Fried meat. Bacon. Sausage. Bologna and other processed lunch meats. Salami. Fatback. Hotdogs. Bratwurst. Salted nuts and seeds. Canned beans with added salt. Canned or smoked fish. Whole eggs or egg yolks. Chicken or turkey with skin. Dairy Whole or 2% milk, cream, and half-and-half. Whole or full-fat cream cheese. Whole-fat or sweetened yogurt. Full-fat cheese. Nondairy creamers. Whipped toppings.  Processed cheese and cheese spreads. Fats and oils Butter. Stick margarine. Lard. Shortening. Ghee. Bacon fat. Tropical oils, such as coconut, palm kernel, or palm oil. Seasoning and other foods Salted popcorn and pretzels. Onion salt, garlic salt, seasoned salt, table salt, and sea salt. Worcestershire sauce. Tartar sauce. Barbecue sauce. Teriyaki sauce. Soy sauce, including reduced-sodium. Steak sauce. Canned and packaged gravies. Fish sauce. Oyster sauce. Cocktail sauce. Horseradish that you find on the shelf. Ketchup. Mustard. Meat flavorings and tenderizers. Bouillon cubes. Hot sauce and Tabasco sauce. Premade or packaged marinades. Premade or packaged taco seasonings. Relishes. Regular salad dressings. Where to find more information:  National Heart, Lung, and Blood Institute: www.nhlbi.nih.gov  American Heart Association: www.heart.org Summary  The DASH eating plan is a healthy eating plan that has been shown to reduce high blood pressure (hypertension). It may also reduce your risk for type 2 diabetes, heart disease, and stroke.  With the DASH eating plan, you should limit salt (sodium) intake to 2,300 mg a day. If you have hypertension, you may need to reduce your sodium intake to 1,500 mg a day.  When on the DASH eating plan, aim to eat more fresh fruits and vegetables, whole grains, lean proteins, low-fat dairy, and heart-healthy fats.  Work with your health care provider or diet and nutrition specialist (dietitian) to adjust your eating plan to your individual   calorie needs. This information is not intended to replace advice given to you by your health care provider. Make sure you discuss any questions you have with your health care provider. Document Released: 06/26/2011 Document Revised: 06/30/2016 Document Reviewed: 06/30/2016 Elsevier Interactive Patient Education  2018 Elsevier Inc.  

## 2018-06-23 LAB — HEMOGLOBIN A1C
Est. average glucose Bld gHb Est-mCnc: 120 mg/dL
HEMOGLOBIN A1C: 5.8 % — AB (ref 4.8–5.6)

## 2018-06-24 NOTE — Progress Notes (Signed)
Your a1c is down to 5.8.  This is awesome! Normal is 5.6 or less. You are almost there! Keep up the great work!

## 2018-06-28 ENCOUNTER — Other Ambulatory Visit: Payer: Self-pay | Admitting: Pharmacist

## 2018-06-28 NOTE — Patient Outreach (Signed)
[facility]) Care Management  06/28/2018  Martha Pham [birth year] [id number]   Incoming call from Larae Grooms in response to the [facility] Medication Adherence Campaign. Speak with patient. HIPAA identifiers verified and verbal consent received.  [name] reports that she takes her losartan 100 mg once daily as directed. Denies any missed doses or barriers to adherence. Counsel patient on the importance of medication adherence. Reports that she sometimes checks her blood pressure at home, but has not recently. Encourage patient to resume checking occasionally.   Note per dispensing report in chart, patient last had this prescription refilled on 06/02/18 for a 90 day supply.  Patient denies any further medication questions/concerns at this time. Will close pharmacy episode.  Harlow Asa, PharmD, [facility] [phone]

## 2018-07-05 ENCOUNTER — Ambulatory Visit
Admission: RE | Admit: 2018-07-05 | Discharge: 2018-07-05 | Disposition: A | Discharge status: Home / Self Care | Payer: PPO | Source: Ambulatory Visit | Attending: Adult Health | Admitting: Adult Health

## 2018-07-05 DIAGNOSIS — Z1231 Encounter for screening mammogram for malignant neoplasm of breast: Secondary | ICD-10-CM | POA: Diagnosis not present

## 2018-07-05 DIAGNOSIS — Z1239 Encounter for other screening for malignant neoplasm of breast: Secondary | ICD-10-CM

## 2018-08-27 ENCOUNTER — Other Ambulatory Visit: Payer: Self-pay | Admitting: Pharmacist

## 2018-08-27 NOTE — Patient Outreach (Signed)
[facility] [facility]) Care Management  [place] - Medication Adherence   08/27/2018  Martha Pham 12-08-1950 [id number]  [facility] Medication: losartan 100 mg Date & Supply of last refill: 06/02/18, 90 day supply Current insurance:[employer]   Outreach:  Incoming call from [employer] in response to the [facility] Medication Adherence Campaign. Speak with patient. HIPAA identifiers verified.  Subjective:   Martha Pham reports that she has been continuing to take her losartan 100 mg once daily as directed. However, reports that she may occasionally miss a dose. Reports that she has not been checking her blood pressure at home because she feels that her current monitor is not accurate. However, reports that on Tuesday, she had an appointment at [facility] and that the chiropractor there took her blood pressure and told her that it was high. Reports that she thinks that she might have forgotten to take her losartan the day before this appointment.  Counsel patient on the importance of medication adherence. Encourage patient to get a new upper arm blood pressure monitor, to start checking her blood pressure regularly and keeping a log of these readings. Patient states that she will go pick one up and start checking again today. Counsel on proper technique for using a blood pressure monitor and counsel that if patient observes her blood pressure to be high, to contact her PCP office to let Martha Pham know, rather than waiting until her next visit. Also counsel patient about using a weekly pillbox to aid with her medication adherence and possibly using a daily alarm on her phone as well, to help her to remember daily.  Patient denies any further medication questions/concerns at this time. Provide patient with my phone number.   Objective: Lab Results  Component Value Date   CREATININE 1.19 (H) 06/02/2018   CREATININE 0.80 02/26/2018   CREATININE 1.00  10/15/2016    Lipid Panel  No results found for: CHOL, TRIG, HDL, CHOLHDL, VLDL, LDLCALC, LDLDIRECT  BP Readings from Last 3 Encounters:  06/22/18 134/80  06/02/18 126/74  05/05/18 (!) 152/100    Allergies  Allergen Reactions  . Dust Mite Extract Itching  . Other     grass     Assessment:  Barrier identified: . Forgets to take   Plan:  1) Patient to obtain home upper arm blood pressure monitor and check her blood pressure regularly, keep log of results and bring log to medical appointments.  2) Patient to start using weekly pillbox to aid with her medication adherence.  3) Will close pharmacy episode.   Harlow Asa, PharmD, [facility] [phone]

## 2018-09-08 ENCOUNTER — Ambulatory Visit (INDEPENDENT_AMBULATORY_CARE_PROVIDER_SITE_OTHER): Payer: PPO | Admitting: Neurology

## 2018-09-08 ENCOUNTER — Encounter: Payer: Self-pay | Admitting: Neurology

## 2018-09-08 DIAGNOSIS — G2581 Restless legs syndrome: Secondary | ICD-10-CM

## 2018-09-08 MED ORDER — GABAPENTIN 100 MG PO CAPS: ORAL_CAPSULE | ORAL | 3 refills | Status: DC

## 2018-09-08 NOTE — Progress Notes (Signed)
Subjective:    Patient ID: Martha Pham is a 68 y.o. female.  HPI     Interim history:   [name] is a 68 year old right-handed woman with an underlying medical history of hyperlipidemia, hypertension, vitamin D deficiency, chronic kidney disease, breast cancer in 2012 (s/p lumpectomies, L mastectomy, and chemo), low back pain and obesity, who presents for follow-up consultation of her restless leg syndrome, on Rx with gabapentin prn. The patient is unaccompanied today and presents for her yearly checkup. I last saw her on 09/02/2017, at which time she reported that she eventually had stopped taking gabapentin. She did have some intermittent leg discomfort, left side more than right. She was interested in restarting gabapentin as needed, which I prescribed.  Today, 09/08/18: She reports doing well. She has lost a significant amount of weight. She has been going to a weight management clinic. She is taking some supplements currently including magnesium, multivitamin, zinc. She will finish those supplements and not renew them. She has been using the gabapentin infrequently, maybe as little as once or twice a month for restless leg symptoms that are infrequent currently and low-dose gabapentin has been helpful. She has been able to reduce her A1c, latest A1c in December 2019 was 5.8 but per her report it had gone up to 7 or above 7.  The patient's allergies, current medications, family history, past medical history, past social history, past surgical history and problem list were reviewed and updated as appropriate.    Previously (copied from previous notes for reference):    Of note, she no showed for an appointment on 06/22/2017. I saw her on 06/19/2016, at which time she reported improvement in her leg pain. She had not been taking gabapentin regularly. She had improvement in her lower back pain after she went to the chiropractor. She was advised to follow-up routinely in one year and utilize  gabapentin as needed.   I saw her on 06/05/2015, at which time she reported doing fairly well. She had noticed improvement of her restless leg symptoms on gabapentin. She had some low back pain, was not sure if the gabapentin was helpful for that as well. She was stable and I suggested a one-year checkup. She did not realize that she had refills on her gabapentin and I made sure she had enough refills to last her until her next appointment.   I saw her on 11/30/2014, at which time she reported feeling fairly stable. She had some low back pain with radiation to the left. She had occasional restless leg symptoms. She had no new complaints. I suggested a trial of low-dose gabapentin at night, 100 mg strength as needed for residual restless leg symptoms and to help improve her back discomfort.    I first met her on 10/31/2014 at the request of her primary care physician, at which time the patient reported a prior diagnosis of obstructive sleep apnea. I suggested we bring her back for sleep study. She is a baseline sleep study on 11/06/2014 and underwent over her test results with her in detail today. Her sleep efficiency was reduced at 78.7% with a normal sleep latency and wake after sleep onset of 71.5 minutes with moderate to severe sleep fragmentation noted. She had an elevated arousal index. She had an increased percentage of stage I and stage II sleep, near absence of slow-wave sleep and a markedly decreased percentage of REM sleep at 3.9% with a very long REM latency. She had mild PLMS at 13.9  per hour, with arousals. She had a normal EKG and EEG. She had mild to moderate intermittent snoring. Her total AHI was normal at 1.4 per hour, baseline oxygen saturation 96%, nadir was 90%. REM AHI was 26.1 per hour.   She was diagnosed with obstructive sleep apnea several years ago. She was placed on CPAP therapy. I reviewed her baseline sleep study results performed at the [facility] sleep [facility] on  08/05/2009: Sleep efficiency was 91.4%. REM latency was 209 minutes. Arousal index was 19.1 per hour. Her total AHI was 9.3 per hour. Her REM AHI was 36.7 per hour. Average oxygen saturation was 96%, nadir was 82%. CPAP titration was recommended.    I reviewed her blood test results from 09/14/2014 through your office: TSH was unremarkable, hemoglobin A1c was 5.6, total cholesterol 177, HDL 58, LDL 97, CMP unremarkable, CBC with differential unremarkable very she had an EKG on 09/14/2014 which upon my review looked unremarkable.   She brought her CPAP machine but reports that she has not used it in the last few months because of air leaking. There is no SD card of the machine.    She has lack of energy and occasional AM HAs, occasional night time palpitations and nocturia x 2 on average. In February, her GYN noted a new heart murmur, but her during her appointment on 09/14/14 showed no murmur and EKG was fine.    Her bedtime is around 11:30 PM and typically she falls asleep quickly. Sometimes she wakes up in the middle of the night other than going to the bathroom. She has restless leg symptoms almost on a nightly basis but is not sure if she twitches or kicks in her sleep. She has no parasomnias. Her rise time is around 8 and she wakes up marginally rested on most days. She sleeps alone. She lives alone. She has one daughter who lives in the area but will be moving to [place] soon. The patient is retired and worked for [employer]. She is a nonsmoker and drinks alcohol rarely. She drinks coffee maybe once a day.   Her Past Medical History Is Significant For: Past Medical History:  Diagnosis Date  . Apnea, sleep    does wear cpap  . Arthritis   . Cancer ([place])    lt. breast ca-snbx  . Chronic kidney disease   . Contact lens/glasses fitting    wears contacts or glasses  . Heart murmur   . Hypercholesteremia   . Hyperlipemia   . Hypertension   . Lower back pain   . Malaise and fatigue   .  Personal history of chemotherapy 2013  . Sleep apnea    SLEEP STUDY DX 10,CPAP BUT DON'TUSE  . Vitamin D deficiency   . Wears dentures    upper    Her Past Surgical History Is Significant For: Past Surgical History:  Procedure Laterality Date  . BREAST BIOPSY Left 05/26/2011   malignant  . BREAST BIOPSY Right 10/29/2016  . BREAST IMPLANT EXCHANGE Left 09/23/2012   Procedure: REMOVAL OF LEFT EXPANDER/PLACEMENT OF IMPLANT WITH REDUCTION OF RIGHT BREAST;  Surgeon: Theodoro Kos, DO;  Location: [facility];  Service: Plastics;  Laterality: Left;  . BREAST LUMPECTOMY W/ NEEDLE LOCALIZATION  06/24/2011   left  . BREAST REDUCTION SURGERY Right 09/23/2012   Procedure: MAMMARY REDUCTION  (BREAST);  Surgeon: Theodoro Kos, DO;  Location: [place];  Service: Plastics;  Laterality: Right;  . BREAST SURGERY  08/07/2011  re-exc. left breast margins  . CYST REMOVAL NECK  08/2013   Dr. Migdalia Dk  . LIPOSUCTION Bilateral 09/23/2012   Procedure: LIPOSUCTION;  Surgeon: Theodoro Kos, DO;  Location: [place];  Service: Plastics;  Laterality: Bilateral;  . MASTECTOMY Left 2012  . MOUTH SURGERY     TOOTH IMPLANT BOTTOM X2 LFT  . PORT-A-CATH REMOVAL Right 09/23/2012   Procedure: REMOVAL PORT-A-CATH;  Surgeon: Theodoro Kos, DO;  Location: [facility];  Service: Plastics;  Laterality: Right;  . PORTACATH PLACEMENT  06/24/2011   Procedure: INSERTION PORT-A-CATH;  Surgeon: Judieth Keens, DO;  Location: [facility];  Service: General;  Laterality: Right;  Right mediport placement  . RECONSTRUCTION BREAST IMMEDIATE / DELAYED W/ TISSUE EXPANDER  10/13   left  . REDUCTION MAMMAPLASTY Right 2013  . SIMPLE MASTECTOMY  09/01/2011   left  . TONSILLECTOMY      Her Family History Is Significant For: Family History  Problem Relation Age of Onset  . Brain cancer Father   . Heart attack Mother   . Breast cancer Sister 14  . Breast cancer  Sister 5    Her Social History Is Significant For: Social History   Socioeconomic History  . Marital status: Widowed    Spouse name: Not on file  . Number of children: 2  . Years of education: 42  . Highest education level: Not on file  Occupational History  . Occupation: Retired  Scientific laboratory technician  . Financial resource strain: Not on file  . Food insecurity:    Worry: Not on file    Inability: Not on file  . Transportation needs:    Medical: Not on file    Non-medical: Not on file  Tobacco Use  . Smoking status: Never Smoker  . Smokeless tobacco: Never Used  Substance and Sexual Activity  . Alcohol use: Yes    Alcohol/week: 2.0 standard drinks    Types: 2 Glasses of wine per week    Comment: 8 oz occasionally per week  . Drug use: No  . Sexual activity: Yes    Partners: Male    Birth control/protection: Post-menopausal  Lifestyle  . Physical activity:    Days per week: Not on file    Minutes per session: Not on file  . Stress: Not on file  Relationships  . Social connections:    Talks on phone: Not on file    Gets together: Not on file    Attends religious service: Not on file    Active member of club or organization: Not on file    Attends meetings of clubs or organizations: Not on file    Relationship status: Not on file  Other Topics Concern  . Not on file  Social History Narrative   Daughter [place], [place]    Her Allergies Are:  Allergies  Allergen Reactions  . Dust Mite Extract Itching  . Other     grass  :   Her Current Medications Are:  Outpatient Encounter Medications as of 09/08/2018  Medication Sig  . acetaminophen (TYLENOL) 500 MG tablet Take 500 mg by mouth every 6 (six) hours as needed for moderate pain.   Marland Kitchen atorvastatin (LIPITOR) 20 MG tablet Take 20 mg by mouth daily.  . calcium carbonate (OS-CAL) 600 MG TABS Take 600 mg by mouth 2 (two) times daily with a meal.    . Carboxymethylcellulose Sodium (REFRESH OP) Apply 1 drop to eye 2 (two)  times daily as  needed. For dry eyes  . diazepam (VALIUM) 2 MG tablet Take 1 tablet (2 mg total) by mouth every 6 (six) hours as needed for muscle spasms.  . fish oil-omega-3 fatty acids 1000 MG capsule Take 2 g by mouth every evening.   . gabapentin (NEURONTIN) 100 MG capsule Take 1 pill at bedtime as needed for leg pain.  . hydrochlorothiazide (HYDRODIURIL) 25 MG tablet Take 1 tablet (25 mg total) by mouth daily.  Marland Kitchen losartan (COZAAR) 100 MG tablet Take 1 tablet (100 mg total) by mouth daily.  . metoprolol succinate (TOPROL-XL) 25 MG 24 hr tablet Take 1 tablet (25 mg total) by mouth daily.  . Vitamin D, Cholecalciferol, 50 MCG (2000 UT) CAPS Take by mouth.  . [DISCONTINUED] gabapentin (NEURONTIN) 100 MG capsule Take 1 pill at bedtime as needed for leg pain.  . [DISCONTINUED] MICARDIS HCT 40-12.5 MG per tablet Take 1 tablet by mouth Daily.   No facility-administered encounter medications on file as of 09/08/2018.   :  Review of Systems:  Out of a complete 14 point review of systems, all are reviewed and negative with the exception of these symptoms as listed below: Review of Systems  Neurological:       Pt presents today to discuss her RLS. Pt reports that she has lost 30+ lbs since her last visit in an effort to control her newly diagnosed DM.    Objective:  Neurological Exam  Physical Exam Physical Examination:   Vitals:   09/08/18 1040  BP: (!) 160/89  Pulse: 74    General Examination: The patient is a very pleasant 68 y.o. female in no acute distress. She appears well-developed and well-nourished and well groomed. Good spirits.   HEENT:Normocephalic, atraumatic, pupils are equal, round and reactive to light and accommodation. Corrective eyeglasses in place. Extraocular tracking is good, hearing is grossly intact. Face is symmetric. Speech is clear, no dysarthria, no voice tremor. Airway examination shows benign results, tongue protrudes centrally and palate elevates  symmetrically.  Chest:Clear to auscultation without wheezing, rhonchi or crackles noted.  Heart:S1+S2+0, regular and normal without murmurs, rubs or gallops noted. Perhaps a transient systolic flow murmur noted.   Abdomen:Soft, non-tender and non-distended.  Extremities:There is no pitting edema in the distal lower extremities bilaterally.   Skin: Warm and dry without trophic changes noted.  Musculoskeletal: exam reveals no obvious joint deformities, tenderness or joint swelling or erythema.   Neurologically:  Mental status: The patient is awake, alert and oriented in all 4 spheres. Her immediate and remote memory, attention, language skills and fund of knowledge are appropriate. There is no evidence of aphasia, agnosia, apraxia or anomia. Speech is clear with normal prosody and enunciation. Thought process is linear. Mood is normal and affect is normal.  Cranial nerves II - XII are as described above under HEENT exam.  Motor exam: Normal bulk, strength and tone is noted. There is no drift, tremor or rebound. Romberg is negative. Reflexes are 1-2+ throughout. Fine motor skills and coordination: grossly intact.  Cerebellar testing: No dysmetria or intention tremor. There is no truncal or gait ataxia.  Sensory exam: intact to light touch in the upper and lower extremities.  Gait, station and balance: She stands easily. No veering to one side is noted. No leaning to one side is noted. Posture is age-appropriate and stance is narrow based. Gait shows normal stride length and normal pace. No problems turning are noted.Tandem walk is good today.   Assessment and Plan:  In summary, ABAGALE BOULOS is a very pleasant 67 year old female with an underlying medical history of hyperlipidemia, hypertension, vitamin D deficiency, chronic kidney disease, low back pain and overweight state, who presents for follow-up consultation of her restless leg syndrome. She has intermittent symptoms and  has had good results with gabapentin as needed. She carries a prior diagnosis of mild sleep apnea which was diagnosed in 2011 but her sleep study with [place] in April 2016 did not confirm any significant sleep disordered breathing. She has lost a significant amount of weight and her A1c has come down. She is congratulated on her weight loss success and encouraged to continue with her healthy lifestyle. I renewed her prescription for gabapentin which she has been using infrequently, low-dose, 100 mg as needed at night. She is advised to routinely follow-up in one year, sooner if needed. I answered all her questions today and she was in agreement. I spent 25 minutes in total face-to-face time with the patient, more than 50% of which was spent in counseling and coordination of care, reviewing test results, reviewing medication and discussing or reviewing the diagnosis of RLS, its prognosis and treatment options. Pertinent laboratory and imaging test results that were available during this visit with the patient were reviewed by me and considered in my medical decision making (see chart for details).

## 2018-09-08 NOTE — Patient Instructions (Signed)
You can continue with the gabapentin 100 mg as needed at night for occasional RLS symptoms.  You look great! Good job with the weight loss.  I will see you back in one year.

## 2018-09-21 ENCOUNTER — Encounter: Payer: Self-pay | Admitting: Internal Medicine

## 2018-09-21 ENCOUNTER — Ambulatory Visit (INDEPENDENT_AMBULATORY_CARE_PROVIDER_SITE_OTHER): Payer: PPO | Admitting: Internal Medicine

## 2018-09-21 VITALS — BP 134/78 | HR 67 | Temp 98.1°F | Ht 59.0 in | Wt 149.4 lb

## 2018-09-21 DIAGNOSIS — E6609 Other obesity due to excess calories: Secondary | ICD-10-CM | POA: Diagnosis not present

## 2018-09-21 DIAGNOSIS — N182 Chronic kidney disease, stage 2 (mild): Secondary | ICD-10-CM | POA: Diagnosis not present

## 2018-09-21 DIAGNOSIS — E1122 Type 2 diabetes mellitus with diabetic chronic kidney disease: Secondary | ICD-10-CM

## 2018-09-21 DIAGNOSIS — E2839 Other primary ovarian failure: Secondary | ICD-10-CM

## 2018-09-21 DIAGNOSIS — Z6830 Body mass index (BMI) 30.0-30.9, adult: Secondary | ICD-10-CM

## 2018-09-21 DIAGNOSIS — I129 Hypertensive chronic kidney disease with stage 1 through stage 4 chronic kidney disease, or unspecified chronic kidney disease: Secondary | ICD-10-CM

## 2018-09-21 NOTE — Patient Instructions (Signed)
Diabetes Mellitus and Nutrition, Adult  When you have diabetes (diabetes mellitus), it is very important to have healthy eating habits because your blood sugar (glucose) levels are greatly affected by what you eat and drink. Eating healthy foods in the appropriate amounts, at about the same times every day, can help you:  · Control your blood glucose.  · Lower your risk of heart disease.  · Improve your blood pressure.  · Reach or maintain a healthy weight.  Every person with diabetes is different, and each person has different needs for a meal plan. Your health care provider may recommend that you work with a diet and nutrition specialist (dietitian) to make a meal plan that is best for you. Your meal plan may vary depending on factors such as:  · The calories you need.  · The medicines you take.  · Your weight.  · Your blood glucose, blood pressure, and cholesterol levels.  · Your activity level.  · Other health conditions you have, such as heart or kidney disease.  How do carbohydrates affect me?  Carbohydrates, also called carbs, affect your blood glucose level more than any other type of food. Eating carbs naturally raises the amount of glucose in your blood. Carb counting is a method for keeping track of how many carbs you eat. Counting carbs is important to keep your blood glucose at a healthy level, especially if you use insulin or take certain oral diabetes medicines.  It is important to know how many carbs you can safely have in each meal. This is different for every person. Your dietitian can help you calculate how many carbs you should have at each meal and for each snack.  Foods that contain carbs include:  · Bread, cereal, rice, pasta, and crackers.  · Potatoes and corn.  · Peas, beans, and lentils.  · Milk and yogurt.  · Fruit and juice.  · Desserts, such as cakes, cookies, ice cream, and candy.  How does alcohol affect me?  Alcohol can cause a sudden decrease in blood glucose (hypoglycemia),  especially if you use insulin or take certain oral diabetes medicines. Hypoglycemia can be a life-threatening condition. Symptoms of hypoglycemia (sleepiness, dizziness, and confusion) are similar to symptoms of having too much alcohol.  If your health care provider says that alcohol is safe for you, follow these guidelines:  · Limit alcohol intake to no more than 1 drink per day for nonpregnant women and 2 drinks per day for men. One drink equals 12 oz of beer, 5 oz of wine, or 1½ oz of hard liquor.  · Do not drink on an empty stomach.  · Keep yourself hydrated with water, diet soda, or unsweetened iced tea.  · Keep in mind that regular soda, juice, and other mixers may contain a lot of sugar and must be counted as carbs.  What are tips for following this plan?    Reading food labels  · Start by checking the serving size on the "Nutrition Facts" label of packaged foods and drinks. The amount of calories, carbs, fats, and other nutrients listed on the label is based on one serving of the item. Many items contain more than one serving per package.  · Check the total grams (g) of carbs in one serving. You can calculate the number of servings of carbs in one serving by dividing the total carbs by 15. For example, if a food has 30 g of total carbs, it would be equal to 2   servings of carbs.  · Check the number of grams (g) of saturated and trans fats in one serving. Choose foods that have low or no amount of these fats.  · Check the number of milligrams (mg) of salt (sodium) in one serving. Most people should limit total sodium intake to less than 2,300 mg per day.  · Always check the nutrition information of foods labeled as "low-fat" or "nonfat". These foods may be higher in added sugar or refined carbs and should be avoided.  · Talk to your dietitian to identify your daily goals for nutrients listed on the label.  Shopping  · Avoid buying canned, premade, or processed foods. These foods tend to be high in fat, sodium,  and added sugar.  · Shop around the outside edge of the grocery store. This includes fresh fruits and vegetables, bulk grains, fresh meats, and fresh dairy.  Cooking  · Use low-heat cooking methods, such as baking, instead of high-heat cooking methods like deep frying.  · Cook using healthy oils, such as olive, canola, or sunflower oil.  · Avoid cooking with butter, cream, or high-fat meats.  Meal planning  · Eat meals and snacks regularly, preferably at the same times every day. Avoid going long periods of time without eating.  · Eat foods high in fiber, such as fresh fruits, vegetables, beans, and whole grains. Talk to your dietitian about how many servings of carbs you can eat at each meal.  · Eat 4-6 ounces (oz) of lean protein each day, such as lean meat, chicken, fish, eggs, or tofu. One oz of lean protein is equal to:  ? 1 oz of meat, chicken, or fish.  ? 1 egg.  ? ¼ cup of tofu.  · Eat some foods each day that contain healthy fats, such as avocado, nuts, seeds, and fish.  Lifestyle  · Check your blood glucose regularly.  · Exercise regularly as told by your health care provider. This may include:  ? 150 minutes of moderate-intensity or vigorous-intensity exercise each week. This could be brisk walking, biking, or water aerobics.  ? Stretching and doing strength exercises, such as yoga or weightlifting, at least 2 times a week.  · Take medicines as told by your health care provider.  · Do not use any products that contain nicotine or tobacco, such as cigarettes and e-cigarettes. If you need help quitting, ask your health care provider.  · Work with a counselor or diabetes educator to identify strategies to manage stress and any emotional and social challenges.  Questions to ask a health care provider  · Do I need to meet with a diabetes educator?  · Do I need to meet with a dietitian?  · What number can I call if I have questions?  · When are the best times to check my blood glucose?  Where to find more  information:  · American Diabetes Association: diabetes.org  · Academy of Nutrition and Dietetics: www.eatright.org  · National Institute of Diabetes and Digestive and Kidney Diseases (NIH): www.niddk.nih.gov  Summary  · A healthy meal plan will help you control your blood glucose and maintain a healthy lifestyle.  · Working with a diet and nutrition specialist (dietitian) can help you make a meal plan that is best for you.  · Keep in mind that carbohydrates (carbs) and alcohol have immediate effects on your blood glucose levels. It is important to count carbs and to use alcohol carefully.  This information is not intended to   replace advice given to you by your health care provider. Make sure you discuss any questions you have with your health care provider.  Document Released: 04/03/2005 Document Revised: 02/04/2017 Document Reviewed: 08/11/2016  Elsevier Interactive Patient Education © 2019 [employer]vier Inc.

## 2018-09-21 NOTE — Progress Notes (Signed)
Subjective:     Patient ID: Martha Pham , female    DOB: March 07, 1951 , 68 y.o.   MRN: [id number]   Chief Complaint  Patient presents with  . Diabetes  . Hypertension    HPI  Diabetes  She presents for her follow-up diabetic visit. She has type 2 diabetes mellitus. Her disease course has been stable. There are no hypoglycemic associated symptoms. Pertinent negatives for diabetes include no blurred vision and no chest pain. There are no hypoglycemic complications. Risk factors for coronary artery disease include diabetes mellitus, dyslipidemia, hypertension, obesity and post-menopausal.  Hypertension  This is a chronic problem. The current episode started more than 1 year ago. The problem has been gradually improving since onset. The problem is controlled. Pertinent negatives include no blurred vision, chest pain, palpitations or shortness of breath.  Reports compliance with meds.    Past Medical History:  Diagnosis Date  . Apnea, sleep    does wear cpap  . Arthritis   . Cancer ([place])    lt. breast ca-snbx  . Chronic kidney disease   . Contact lens/glasses fitting    wears contacts or glasses  . Heart murmur   . Hypercholesteremia   . Hyperlipemia   . Hypertension   . Lower back pain   . Malaise and fatigue   . Personal history of chemotherapy 2013  . Sleep apnea    SLEEP STUDY DX 10,CPAP BUT DON'TUSE  . Vitamin D deficiency   . Wears dentures    upper     Family History  Problem Relation Age of Onset  . Brain cancer Father   . Heart attack Mother   . Breast cancer Sister 1  . Breast cancer Sister 85     Current Outpatient Medications:  .  acetaminophen (TYLENOL) 500 MG tablet, Take 500 mg by mouth every 6 (six) hours as needed for moderate pain. , Disp: , Rfl:  .  atorvastatin (LIPITOR) 20 MG tablet, Take 20 mg by mouth daily., Disp: , Rfl:  .  calcium carbonate (OS-CAL) 600 MG TABS, Take 600 mg by mouth 2 (two) times daily with a meal.  , Disp: , Rfl:  .   Carboxymethylcellulose Sodium (REFRESH OP), Apply 1 drop to eye 2 (two) times daily as needed. For dry eyes, Disp: , Rfl:  .  fish oil-omega-3 fatty acids 1000 MG capsule, Take 2 g by mouth every evening. , Disp: , Rfl:  .  gabapentin (NEURONTIN) 100 MG capsule, Take 1 pill at bedtime as needed for leg pain., Disp: 90 capsule, Rfl: 3 .  hydrochlorothiazide (HYDRODIURIL) 25 MG tablet, Take 1 tablet (25 mg total) by mouth daily., Disp: 90 tablet, Rfl: 1 .  losartan (COZAAR) 100 MG tablet, Take 1 tablet (100 mg total) by mouth daily., Disp: 90 tablet, Rfl: 3 .  metoprolol succinate (TOPROL-XL) 25 MG 24 hr tablet, Take 1 tablet (25 mg total) by mouth daily., Disp: 90 tablet, Rfl: 1 .  Vitamin D, Cholecalciferol, 50 MCG (2000 UT) CAPS, Take by mouth., Disp: , Rfl:    Allergies  Allergen Reactions  . Dust Mite Extract Itching  . Other     grass     Review of Systems  Constitutional: Negative.   Eyes: Negative for blurred vision.  Respiratory: Negative.  Negative for shortness of breath.   Cardiovascular: Negative.  Negative for chest pain and palpitations.  Gastrointestinal: Negative.   Neurological: Negative.   Psychiatric/Behavioral: Negative.      Today's  Vitals   09/21/18 1003  BP: 134/78  Pulse: 67  Temp: 98.1 F (36.7 C)  TempSrc: Oral  Weight: 149 lb 6.4 oz (67.8 kg)  Height: 4\' 11"  (1.499 m)  PainSc: 4   PainLoc: Back   Body mass index is 30.18 kg/m.   Objective:  Physical Exam Vitals signs and nursing note reviewed.  Constitutional:      Appearance: Normal appearance.  HENT:     Head: Normocephalic and atraumatic.  Cardiovascular:     Rate and Rhythm: Normal rate and regular rhythm.     Heart sounds: Normal heart sounds.  Pulmonary:     Effort: Pulmonary effort is normal.     Breath sounds: Normal breath sounds.  Skin:    General: Skin is warm.  Neurological:     General: No focal deficit present.     Mental Status: She is alert.  Psychiatric:        Mood  and Affect: Mood normal.        Behavior: Behavior normal.         Assessment And Plan:     1. Diabetes mellitus with stage 2 chronic kidney disease (HCC)  I will check bmp, hba1c today. She was given a meter and taught how to check her sugars.   2. Hypertensive nephropathy  Controlled. She is aware optimal bp is less than 130/80.  She will continue with current meds for now.   3. Estrogen deficiency  I will refer her to [facility], her gyn, for dexa scan. She requests to be seen in June 2020.   - DG Bone Density; Future  4. Class 1 obesity due to excess calories with serious comorbidity and body mass index (BMI) of 30.0 to 30.9 in adult  Importance of achieving optimal weight to decrease risk of cardiovascular disease and cancers was discussed with the patient in full detail. She is encouraged to start slowly - start with 10 minutes twice daily at least three to four days per week and to gradually build to 30 minutes five days weekly. She was given tips to incorporate more activity into her daily routine - take stairs when possible, park farther away from her job, grocery stores, etc.  [name], MD

## 2018-09-22 ENCOUNTER — Other Ambulatory Visit: Payer: Self-pay

## 2018-09-22 DIAGNOSIS — E1122 Type 2 diabetes mellitus with diabetic chronic kidney disease: Secondary | ICD-10-CM | POA: Diagnosis not present

## 2018-09-22 DIAGNOSIS — N182 Chronic kidney disease, stage 2 (mild): Principal | ICD-10-CM

## 2018-09-23 LAB — BMP8+EGFR
BUN/Creatinine Ratio: 19 (ref 12–28)
BUN: 18 mg/dL (ref 8–27)
CHLORIDE: 102 mmol/L (ref 96–106)
CO2: 25 mmol/L (ref 20–29)
Calcium: 10 mg/dL (ref 8.7–10.3)
Creatinine, Ser: 0.93 mg/dL (ref 0.57–1.00)
GFR calc Af Amer: 73 mL/min/{1.73_m2} (ref >59)
GFR, EST NON AFRICAN AMERICAN: 63 mL/min/{1.73_m2} (ref >59)
Glucose: 91 mg/dL (ref 65–99)
POTASSIUM: 3.5 mmol/L (ref 3.5–5.2)
Sodium: 139 mmol/L (ref 134–144)

## 2018-09-23 LAB — HEMOGLOBIN A1C
ESTIMATED AVERAGE GLUCOSE: 103 mg/dL
Hgb A1c MFr Bld: 5.2 % (ref 4.8–5.6)

## 2018-09-28 ENCOUNTER — Telehealth: Payer: Self-pay | Admitting: Adult Health

## 2018-09-28 ENCOUNTER — Ambulatory Visit (HOSPITAL_COMMUNITY)
Admission: RE | Admit: 2018-09-28 | Discharge: 2018-09-28 | Disposition: A | Discharge status: Home / Self Care | Payer: PPO | Source: Ambulatory Visit | Attending: Adult Health | Admitting: Adult Health

## 2018-09-28 ENCOUNTER — Inpatient Hospital Stay: Payer: PPO | Attending: Adult Health | Admitting: Adult Health

## 2018-09-28 ENCOUNTER — Encounter: Payer: Self-pay | Admitting: Adult Health

## 2018-09-28 VITALS — BP 142/75 | HR 63 | Temp 97.6°F | Resp 18 | Ht 59.0 in | Wt 150.0 lb

## 2018-09-28 DIAGNOSIS — Z9221 Personal history of antineoplastic chemotherapy: Secondary | ICD-10-CM | POA: Diagnosis not present

## 2018-09-28 DIAGNOSIS — Z170 Estrogen receptor positive status [ER+]: Secondary | ICD-10-CM | POA: Insufficient documentation

## 2018-09-28 DIAGNOSIS — Z171 Estrogen receptor negative status [ER-]: Secondary | ICD-10-CM | POA: Diagnosis not present

## 2018-09-28 DIAGNOSIS — Z79899 Other long term (current) drug therapy: Secondary | ICD-10-CM | POA: Diagnosis not present

## 2018-09-28 DIAGNOSIS — Z1239 Encounter for other screening for malignant neoplasm of breast: Secondary | ICD-10-CM

## 2018-09-28 DIAGNOSIS — C50212 Malignant neoplasm of upper-inner quadrant of left female breast: Secondary | ICD-10-CM | POA: Diagnosis not present

## 2018-09-28 DIAGNOSIS — D229 Melanocytic nevi, unspecified: Secondary | ICD-10-CM

## 2018-09-28 DIAGNOSIS — R0789 Other chest pain: Secondary | ICD-10-CM | POA: Diagnosis not present

## 2018-09-28 DIAGNOSIS — E785 Hyperlipidemia, unspecified: Secondary | ICD-10-CM

## 2018-09-28 DIAGNOSIS — I10 Essential (primary) hypertension: Secondary | ICD-10-CM | POA: Insufficient documentation

## 2018-09-28 DIAGNOSIS — R0781 Pleurodynia: Secondary | ICD-10-CM

## 2018-09-28 DIAGNOSIS — E7800 Pure hypercholesterolemia, unspecified: Secondary | ICD-10-CM

## 2018-09-28 DIAGNOSIS — Z853 Personal history of malignant neoplasm of breast: Secondary | ICD-10-CM | POA: Diagnosis not present

## 2018-09-28 DIAGNOSIS — N189 Chronic kidney disease, unspecified: Secondary | ICD-10-CM | POA: Diagnosis not present

## 2018-09-28 DIAGNOSIS — Z9012 Acquired absence of left breast and nipple: Secondary | ICD-10-CM | POA: Insufficient documentation

## 2018-09-28 DIAGNOSIS — L989 Disorder of the skin and subcutaneous tissue, unspecified: Secondary | ICD-10-CM | POA: Diagnosis not present

## 2018-09-28 NOTE — Telephone Encounter (Signed)
Gave avs and calendar ° °

## 2018-09-28 NOTE — Progress Notes (Signed)
 CLINIC:  Survivorship   REASON FOR VISIT:  Routine follow-up for history of breast cancer.   BRIEF ONCOLOGIC HISTORY:    Breast cancer of upper-inner quadrant of left female breast (HCC)   06/12/2011 Initial Diagnosis    Cancer of upper-inner quadrant of female breast: Grade 3 IDC ER negative PR negative HER-2 negative, MRI 2.7 x 2.3 x 1 cm mass    06/24/2011 Surgery    Left breast lumpectomy 2.2 cm IDC grade 3 with high-grade DCIS triple negative, for positive margins patient had reexcision: 2 lymph nodes negative ER/PR negative HER-2 negative ratio 0.97 Ki-67 75%    09/02/2011 Surgery    Left mastectomy    10/01/2011 - 12/03/2011 Chemotherapy    Adjuvant Taxotere and Cytoxan x4 cycles    07/05/2012 Surgery    Breast reconstruction on the left breast with reduction of right breast      INTERVAL HISTORY:  Martha Pham presents to the [facility] today for routine follow-up for her history of breast cancer.  She has had a good year, her family has had a lot of illness that she has dealt with.  She is trying to keep busy.  She notes that she has a burning sensation underneath her right arm.  It isn't a pain.  It is on the right chest wall.  It is constant, without any alleviating or aggravating factors.  She notes burning sensation in her breasts from time to time as well, along with increased tenderness.    She has a skin change that she would like to see a dermatologist about.  She underwent her mammogram in 06/2018 that showed no evidence of malignancy and breast density category A.  Martha Pham is exercising regularly and intentionally losing weight.  She has lost 25-30 pounds.      REVIEW OF SYSTEMS:  Review of Systems  Constitutional: Negative for appetite change, chills, fatigue, fever and unexpected weight change.  HENT:   Negative for hearing loss, lump/mass, mouth sores, sore throat and trouble swallowing.   Eyes: Negative for eye problems and icterus.  Respiratory:  Negative for chest tightness, cough and shortness of breath.   Cardiovascular: Negative for chest pain, leg swelling and palpitations.  Gastrointestinal: Negative for abdominal distention, abdominal pain, constipation, diarrhea, nausea and vomiting.  Endocrine: Negative for hot flashes.  Genitourinary: Negative for difficulty urinating.   Musculoskeletal: Negative for arthralgias.  Skin: Negative for itching and rash.  Neurological: Negative for dizziness, extremity weakness, headaches and numbness.  Hematological: Negative for adenopathy. Does not bruise/bleed easily.  Psychiatric/Behavioral: Negative for depression. The patient is not nervous/anxious.   Breast: Denies any new nodularity, masses, tenderness, nipple changes, or nipple discharge.     PAST MEDICAL/SURGICAL HISTORY:  Past Medical History:  Diagnosis Date  . Apnea, sleep    does wear cpap  . Arthritis   . Cancer (HCC)    lt. breast ca-snbx  . Chronic kidney disease   . Contact lens/glasses fitting    wears contacts or glasses  . Heart murmur   . Hypercholesteremia   . Hyperlipemia   . Hypertension   . Lower back pain   . Malaise and fatigue   . Personal history of chemotherapy 2013  . Sleep apnea    SLEEP STUDY DX 10,CPAP BUT DON'TUSE  . Vitamin D deficiency   . Wears dentures    upper   Past Surgical History:  Procedure Laterality Date  . BREAST BIOPSY Left 05/26/2011   malignant  .   BREAST BIOPSY Right 10/29/2016  . BREAST IMPLANT EXCHANGE Left 09/23/2012   Procedure: REMOVAL OF LEFT EXPANDER/PLACEMENT OF IMPLANT WITH REDUCTION OF RIGHT BREAST;  Surgeon: Theodoro Kos, DO;  Location: [place];  Service: Plastics;  Laterality: Left;  . BREAST LUMPECTOMY W/ NEEDLE LOCALIZATION  06/24/2011   left  . BREAST REDUCTION SURGERY Right 09/23/2012   Procedure: MAMMARY REDUCTION  (BREAST);  Surgeon: Theodoro Kos, DO;  Location: [place];  Service: Plastics;  Laterality: Right;  .  BREAST SURGERY  08/07/2011   re-exc. left breast margins  . CYST REMOVAL NECK  08/2013   Dr. Migdalia Dk  . LIPOSUCTION Bilateral 09/23/2012   Procedure: LIPOSUCTION;  Surgeon: Theodoro Kos, DO;  Location: [place];  Service: Plastics;  Laterality: Bilateral;  . MASTECTOMY Left 2012  . MOUTH SURGERY     TOOTH IMPLANT BOTTOM X2 LFT  . PORT-A-CATH REMOVAL Right 09/23/2012   Procedure: REMOVAL PORT-A-CATH;  Surgeon: Theodoro Kos, DO;  Location: [place];  Service: Plastics;  Laterality: Right;  . PORTACATH PLACEMENT  06/24/2011   Procedure: INSERTION PORT-A-CATH;  Surgeon: Judieth Keens, DO;  Location: [place];  Service: General;  Laterality: Right;  Right mediport placement  . RECONSTRUCTION BREAST IMMEDIATE / DELAYED W/ TISSUE EXPANDER  10/13   left  . REDUCTION MAMMAPLASTY Right 2013  . SIMPLE MASTECTOMY  09/01/2011   left  . TONSILLECTOMY       ALLERGIES:  Allergies  Allergen Reactions  . Dust Mite Extract Itching  . Other     grass     CURRENT MEDICATIONS:  Outpatient Encounter Medications as of 09/28/2018  Medication Sig  . acetaminophen (TYLENOL) 500 MG tablet Take 500 mg by mouth every 6 (six) hours as needed for moderate pain.   Marland Kitchen atorvastatin (LIPITOR) 20 MG tablet Take 20 mg by mouth daily.  . calcium carbonate (OS-CAL) 600 MG TABS Take 600 mg by mouth 2 (two) times daily with a meal.    . Carboxymethylcellulose Sodium (REFRESH OP) Apply 1 drop to eye 2 (two) times daily as needed. For dry eyes  . fish oil-omega-3 fatty acids 1000 MG capsule Take 2 g by mouth every evening.   . gabapentin (NEURONTIN) 100 MG capsule Take 1 pill at bedtime as needed for leg pain.  . hydrochlorothiazide (HYDRODIURIL) 25 MG tablet Take 1 tablet (25 mg total) by mouth daily.  Marland Kitchen losartan (COZAAR) 100 MG tablet Take 1 tablet (100 mg total) by mouth daily.  . metoprolol succinate (TOPROL-XL) 25 MG 24 hr tablet Take 1 tablet (25 mg total) by mouth  daily.  . Vitamin D, Cholecalciferol, 50 MCG (2000 UT) CAPS Take by mouth.   No facility-administered encounter medications on file as of 09/28/2018.      ONCOLOGIC FAMILY HISTORY:  Family History  Problem Relation Age of Onset  . Brain cancer Father   . Heart attack Mother   . Breast cancer Sister 78  . Breast cancer Sister 50    GENETIC COUNSELING/TESTING: Done at [facility], negative  SOCIAL HISTORY:  Martha Pham is single and lives alone in [place], [place].  Martha Pham is currently retired.  She denies any current or history of tobacco, alcohol, or illicit drug use.  (reviewed and updated 09/28/2018)   PHYSICAL EXAMINATION:  Vital Signs: Vitals:   09/28/18 1116  BP: (!) 142/75  Pulse: 63  Resp: 18  Temp: 97.6 F (36.4 C)  SpO2: 100%  Filed Weights   09/28/18 1116  Weight: 150 lb (68 kg)   General: Well-nourished, well-appearing female in no acute distress.  Unaccompanied today.   HEENT: Head is normocephalic.  Pupils equal and reactive to light. Conjunctivae clear without exudate.  Sclerae anicteric. Oral mucosa is pink, moist.  Oropharynx is pink without lesions or erythema.  Lymph: No cervical, supraclavicular, or infraclavicular lymphadenopathy noted on palpation.  Cardiovascular: Regular rate and rhythm.. Respiratory: Clear to auscultation bilaterally. Chest expansion symmetric; breathing non-labored.  Breast Exam:  -Left breast: No appreciable masses on palpation. No skin redness, thickening, or peau d'orange appearance; no nipple retraction or nipple discharge; mild distortion in symmetry at previous lumpectomy site well healed scar without erythema or nodularity.  -Right breast: No appreciable masses on palpation. No skin redness, thickening, or peau d'orange appearance; no nipple retraction or nipple discharge; -Axilla: No axillary adenopathy bilaterally.  GI: Abdomen soft and round; non-tender, non-distended. Bowel sounds normoactive. No  hepatosplenomegaly.   GU: Deferred.  Neuro: No focal deficits. Steady gait. + tenderness to right rotator cuff Psych: Mood and affect normal and appropriate for situation.  MSK: No focal spinal tenderness to palpation, full range of motion in bilateral upper extremities, mild tenderness on right lateral chest wall, about midway between axilla and lower rib cage Extremities: No edema. Skin: Warm and dry. Multiple nevi, several seborrheic keratoses on skin as well  LABORATORY DATA:  None for this visit   DIAGNOSTIC IMAGING:  Most recent mammogram:      ASSESSMENT AND PLAN:  Ms.. Pham is a pleasant 67 y.o. female with history of Stage IIA left breast invasive ductal carcinoma, ER-/PR-/HER2-, diagnosed in 05/2011, treated with lumpectomy followed by mastectomy, and adjuvant chemotherapy.  She [facility] for surveillance and routine follow-up.   1. History of breast cancer:  Martha Pham is currently clinically and radiographically without evidence of disease or recurrence of breast cancer. She will be due for mammogram in 07/07/2019; orders placed today.  We will see her back in one year for LTS surveillance and monitoring.  I encouraged her to call me with any questions or concerns before her next visit at the cancer center, and I would be happy to see her sooner, if needed.    2. Right chest wall discomfort: will get plain films of her ribs to evaluate.  Likely musculoskeletal in origin.  3. Skin lesions: Appear benign, however due to change in these lesions patient would prefer referral to dermatology. I placed this referral today.  4. Bone health:  Given Martha Pham age, history of breast cancer, she is at risk for bone demineralization. She is going to undergo testing per her PCP in 12/2018.   She was given education on specific food and activities to promote bone health.  5. Cancer screening:  Due to Martha Pham history and her age, she should receive screening for  skin cancers, colon cancer, and gynecologic cancers. She was encouraged to follow-up with her PCP for appropriate cancer screenings.   6. Health maintenance and wellness promotion: Martha Pham was encouraged to consume 5-7 servings of fruits and vegetables per day. She was also encouraged to engage in moderate to vigorous exercise for 30 minutes per day most days of the week. She was instructed to limit her alcohol consumption and continue to abstain from tobacco use.   Dispo:  -Return to cancer center in one year for LTS follow up -Mammogram in 06/2019 when due -Dermatology referral -xray today     A total of (30) minutes of face-to-face time was spent with this patient with greater than 50% of that time in counseling and care-coordination.   Lindsey Cornetto Causey, NP [facility] [phone]   Note: PRIMARY CARE PROVIDER Sanders, Robyn, MD [phone]  

## 2018-09-29 ENCOUNTER — Telehealth: Payer: Self-pay

## 2018-09-29 NOTE — Telephone Encounter (Signed)
-----   Message from Gardenia Phlegm, NP sent at 09/29/2018  8:43 AM EDT ----- Please let patient know that xray is normal, there is no abnormality in the ribs. ----- Message ----- From: Interface, Rad Results In Sent: 09/29/2018   8:15 AM EDT To: Gardenia Phlegm, NP

## 2018-09-29 NOTE — Telephone Encounter (Signed)
Spoke with patient to inform that xray results are normal and no abnormalities shown in the ribs.  Patient voiced understanding and thanks for call.

## 2018-10-01 ENCOUNTER — Other Ambulatory Visit: Payer: Self-pay

## 2018-10-01 ENCOUNTER — Encounter: Payer: Self-pay | Admitting: Internal Medicine

## 2018-10-01 MED ORDER — ATORVASTATIN CALCIUM 20 MG PO TABS: 20.0000 mg | ORAL_TABLET | Freq: Every day | ORAL | 1 refills | Status: DC

## 2019-01-20 ENCOUNTER — Ambulatory Visit (INDEPENDENT_AMBULATORY_CARE_PROVIDER_SITE_OTHER): Payer: PPO | Admitting: Internal Medicine

## 2019-01-20 ENCOUNTER — Encounter: Payer: Self-pay | Admitting: Internal Medicine

## 2019-01-20 ENCOUNTER — Other Ambulatory Visit: Payer: Self-pay

## 2019-01-20 VITALS — BP 130/78 | HR 54 | Temp 98.8°F | Ht 59.0 in | Wt 154.6 lb

## 2019-01-20 DIAGNOSIS — I129 Hypertensive chronic kidney disease with stage 1 through stage 4 chronic kidney disease, or unspecified chronic kidney disease: Secondary | ICD-10-CM

## 2019-01-20 DIAGNOSIS — E66811 Obesity, class 1: Secondary | ICD-10-CM

## 2019-01-20 DIAGNOSIS — E7800 Pure hypercholesterolemia, unspecified: Secondary | ICD-10-CM

## 2019-01-20 DIAGNOSIS — E1122 Type 2 diabetes mellitus with diabetic chronic kidney disease: Secondary | ICD-10-CM

## 2019-01-20 DIAGNOSIS — Z6831 Body mass index (BMI) 31.0-31.9, adult: Secondary | ICD-10-CM

## 2019-01-20 DIAGNOSIS — N182 Chronic kidney disease, stage 2 (mild): Secondary | ICD-10-CM

## 2019-01-20 DIAGNOSIS — E6609 Other obesity due to excess calories: Secondary | ICD-10-CM | POA: Diagnosis not present

## 2019-01-20 DIAGNOSIS — R002 Palpitations: Secondary | ICD-10-CM

## 2019-01-20 NOTE — Patient Instructions (Signed)
Palpitations Palpitations are feelings that your heartbeat is not normal. Your heartbeat may feel like it is:  Uneven.  Faster than normal.  Fluttering.  Skipping a beat. This is usually not a serious problem. In some cases, you may need tests to rule out any serious problems. Follow these instructions at home: Pay attention to any changes in your condition. Take these actions to help manage your symptoms: Eating and drinking  Avoid: ? Coffee, tea, soft drinks, and energy drinks. ? Chocolate. ? Alcohol. ? Diet pills. Lifestyle   Try to lower your stress. These things can help you relax: ? Yoga. ? Deep breathing and meditation. ? Exercise. ? Using words and images to create positive thoughts (guided imagery). ? Using your mind to control things in your body (biofeedback).  Do not use drugs.  Get plenty of rest and sleep. Keep a regular bed time. General instructions   Take over-the-counter and prescription medicines only as told by your doctor.  Do not use any products that contain nicotine or tobacco, such as cigarettes and e-cigarettes. If you need help quitting, ask your doctor.  Keep all follow-up visits as told by your doctor. This is important. You may need more tests if palpitations do not go away or get worse. Contact a doctor if:  Your symptoms last more than 24 hours.  Your symptoms occur more often. Get help right away if you:  Have chest pain.  Feel short of breath.  Have a very bad headache.  Feel dizzy.  Pass out (faint). Summary  Palpitations are feelings that your heartbeat is uneven or faster than normal. It may feel like your heart is fluttering or skipping a beat.  Avoid food and drinks that may cause palpitations. These include caffeine, chocolate, and alcohol.  Try to lower your stress. Do not smoke or use drugs.  Get help right away if you faint or have chest pain, shortness of breath, a severe headache, or dizziness. This  information is not intended to replace advice given to you by your health care provider. Make sure you discuss any questions you have with your health care provider. Document Released: 04/15/2008 Document Revised: 08/19/2017 Document Reviewed: 08/19/2017 Elsevier Patient Education  2020 [employer].

## 2019-01-21 LAB — CBC
Hematocrit: 37.7 % (ref 34.0–46.6)
Hemoglobin: 12.5 g/dL (ref 11.1–15.9)
MCH: 32.6 pg (ref 26.6–33.0)
MCHC: 33.2 g/dL (ref 31.5–35.7)
MCV: 98 fL — ABNORMAL HIGH (ref 79–97)
Platelets: 183 10*3/uL (ref 150–450)
RBC: 3.83 x10E6/uL (ref 3.77–5.28)
RDW: 10.8 % — ABNORMAL LOW (ref 11.7–15.4)
WBC: 5.2 10*3/uL (ref 3.4–10.8)

## 2019-01-21 LAB — CMP14+EGFR
ALT: 13 IU/L (ref 0–32)
AST: 14 IU/L (ref 0–40)
Albumin/Globulin Ratio: 1.7 (ref 1.2–2.2)
Albumin: 4.4 g/dL (ref 3.8–4.8)
Alkaline Phosphatase: 79 IU/L (ref 39–117)
BUN/Creatinine Ratio: 25 (ref 12–28)
BUN: 25 mg/dL (ref 8–27)
Bilirubin Total: 0.5 mg/dL (ref 0.0–1.2)
CO2: 24 mmol/L (ref 20–29)
Calcium: 9.4 mg/dL (ref 8.7–10.3)
Chloride: 103 mmol/L (ref 96–106)
Creatinine, Ser: 0.99 mg/dL (ref 0.57–1.00)
GFR calc Af Amer: 67 mL/min/{1.73_m2} (ref >59)
GFR calc non Af Amer: 58 mL/min/{1.73_m2} — ABNORMAL LOW (ref >59)
Globulin, Total: 2.6 g/dL (ref 1.5–4.5)
Glucose: 114 mg/dL — ABNORMAL HIGH (ref 65–99)
Potassium: 3.4 mmol/L — ABNORMAL LOW (ref 3.5–5.2)
Sodium: 143 mmol/L (ref 134–144)
Total Protein: 7 g/dL (ref 6.0–8.5)

## 2019-01-21 LAB — LIPID PANEL
Chol/HDL Ratio: 2.6 ratio (ref 0.0–4.4)
Cholesterol, Total: 169 mg/dL (ref 100–199)
HDL: 66 mg/dL (ref >39)
LDL Calculated: 89 mg/dL (ref 0–99)
Triglycerides: 72 mg/dL (ref 0–149)
VLDL Cholesterol Cal: 14 mg/dL (ref 5–40)

## 2019-01-21 LAB — HEMOGLOBIN A1C
Est. average glucose Bld gHb Est-mCnc: 114 mg/dL
Hgb A1c MFr Bld: 5.6 % (ref 4.8–5.6)

## 2019-01-21 LAB — MAGNESIUM: Magnesium: 2 mg/dL (ref 1.6–2.3)

## 2019-01-21 LAB — TSH: TSH: 0.845 u[IU]/mL (ref 0.450–4.500)

## 2019-01-22 NOTE — Progress Notes (Signed)
Subjective:     Patient ID: Martha Pham , female    DOB: 1950-09-21 , 68 y.o.   MRN: [id number]   Chief Complaint  Patient presents with  . Diabetes  . Hypertension    HPI  She presents today for diabetes check. She admits that she has not been exercising much since COVID-19.  Diabetes She presents for her follow-up diabetic visit. She has type 2 diabetes mellitus. Her disease course has been improving. There are no hypoglycemic associated symptoms. Pertinent negatives for diabetes include no blurred vision, no chest pain and no fatigue. There are no hypoglycemic complications. Diabetic complications include nephropathy. Risk factors for coronary artery disease include diabetes mellitus, hypertension, obesity, post-menopausal and dyslipidemia. She is following a diabetic diet. She participates in exercise intermittently. An ACE inhibitor/angiotensin II receptor blocker is being taken.  Hypertension This is a chronic problem. The current episode started more than 1 year ago. The problem has been gradually improving since onset. The problem is controlled. Associated symptoms include palpitations. Pertinent negatives include no blurred vision, chest pain or shortness of breath. Risk factors for coronary artery disease include diabetes mellitus, dyslipidemia, obesity, post-menopausal state and sedentary lifestyle. Past treatments include angiotensin blockers and diuretics. Hypertensive end-organ damage includes kidney disease.     Past Medical History:  Diagnosis Date  . Apnea, sleep    does wear cpap  . Arthritis   . Cancer ([place])    lt. breast ca-snbx  . Chronic kidney disease   . Contact lens/glasses fitting    wears contacts or glasses  . Heart murmur   . Hypercholesteremia   . Hyperlipemia   . Hypertension   . Lower back pain   . Malaise and fatigue   . Personal history of chemotherapy 2013  . Sleep apnea    SLEEP STUDY DX 10,CPAP BUT DON'TUSE  . Vitamin D deficiency   .  Wears dentures    upper     Family History  Problem Relation Age of Onset  . Brain cancer Father   . Heart attack Mother   . Breast cancer Sister 31  . Breast cancer Sister 28     Current Outpatient Medications:  .  acetaminophen (TYLENOL) 500 MG tablet, Take 500 mg by mouth every 6 (six) hours as needed for moderate pain. , Disp: , Rfl:  .  atorvastatin (LIPITOR) 20 MG tablet, Take 1 tablet (20 mg total) by mouth daily., Disp: 90 tablet, Rfl: 1 .  calcium carbonate (OS-CAL) 600 MG TABS, Take 600 mg by mouth 2 (two) times daily with a meal.  , Disp: , Rfl:  .  Carboxymethylcellulose Sodium (REFRESH OP), Apply 1 drop to eye 2 (two) times daily as needed. For dry eyes, Disp: , Rfl:  .  fish oil-omega-3 fatty acids 1000 MG capsule, Take 2 g by mouth every evening. , Disp: , Rfl:  .  gabapentin (NEURONTIN) 100 MG capsule, Take 1 pill at bedtime as needed for leg pain., Disp: 90 capsule, Rfl: 3 .  hydrochlorothiazide (HYDRODIURIL) 25 MG tablet, Take 1 tablet (25 mg total) by mouth daily., Disp: 90 tablet, Rfl: 1 .  losartan (COZAAR) 100 MG tablet, Take 1 tablet (100 mg total) by mouth daily., Disp: 90 tablet, Rfl: 3 .  metoprolol succinate (TOPROL-XL) 25 MG 24 hr tablet, Take 1 tablet (25 mg total) by mouth daily., Disp: 90 tablet, Rfl: 1 .  Vitamin D, Cholecalciferol, 50 MCG (2000 UT) CAPS, Take by mouth., Disp: , Rfl:  Allergies  Allergen Reactions  . Dust Mite Extract Itching  . Other     grass     Review of Systems  Constitutional: Negative.  Negative for fatigue.  Eyes: Negative for blurred vision.  Respiratory: Negative.  Negative for shortness of breath.   Cardiovascular: Positive for palpitations. Negative for chest pain.       She reports having some palpitations at night. She is not sure what is triggering her sx. Denies associated chest pain and shortness of breath.   Gastrointestinal: Negative.   Neurological: Negative.   Psychiatric/Behavioral: Negative.       Today's Vitals   01/20/19 1009  BP: 130/78  Pulse: (!) 54  Temp: 98.8 F (37.1 C)  TempSrc: Oral  Weight: 154 lb 9.6 oz (70.1 kg)  Height: _0  (1.499 m)  PainSc: 3   PainLoc: Arm   Body mass index is 31.23 kg/m.   Objective:  Physical Exam Vitals signs and nursing note reviewed.  Constitutional:      Appearance: Normal appearance.  HENT:     Head: Normocephalic and atraumatic.  Cardiovascular:     Rate and Rhythm: Normal rate and regular rhythm.     Heart sounds: Normal heart sounds.  Pulmonary:     Effort: Pulmonary effort is normal.     Breath sounds: Normal breath sounds.  Skin:    General: Skin is warm.  Neurological:     General: No focal deficit present.     Mental Status: She is alert.  Psychiatric:        Mood and Affect: Mood normal.        Behavior: Behavior normal.         Assessment And Plan:     1. Diabetes mellitus with stage 2 chronic kidney disease ([place])  I will check labs as listed below. She wishes to continue off of medication.  She is encouraged to avoid sugary beverages and processed foods.  Importance of regular exercise was discussed with the patient.   - CMP14+EGFR - Hemoglobin A1c  2. Hypertensive nephropathy  Controlled. She will continue with current meds. She is encouraged to avoid adding salt to her foods.   3. Pure hypercholesterolemia  Chronic. She will continue with current meds.   - Lipid panel  4. Palpitations  I will refer her to Cardiology as requested. She has seen Martha Pham in the past. I will also check labs as listed below. She is encouraged to start taking magnesium 474m nightly.   - CBC no Diff - TSH - Magnesium - Ambulatory referral to Cardiology  5. Class 1 obesity due to excess calories with serious comorbidity and body mass index (BMI) of 31.0 to 31.9 in adult  Importance of achieving optimal weight to decrease risk of cardiovascular disease and cancers was discussed with the patient in full  detail. She is encouraged to start slowly - start with 10 minutes twice daily at least three to four days per week and to gradually build to 30 minutes five days weekly. She was given tips to incorporate more activity into her daily routine - take stairs when possible, park farther away from grocery stores, etc.    RMaximino Greenland MD    THE PATIENT IS ENCOURAGED TO PRACTICE SOCIAL DISTANCING DUE TO THE COVID-19 PANDEMIC.

## 2019-02-02 ENCOUNTER — Encounter: Payer: Self-pay | Admitting: Cardiology

## 2019-02-02 ENCOUNTER — Telehealth: Payer: Self-pay

## 2019-02-02 NOTE — Telephone Encounter (Signed)
-----   Message from Glendale Chard, MD sent at 01/21/2019  7:48 AM EDT ----- Your blood count is normal. Your chol is great. Continue with current meds. Your liver and kidney fxn are stable. Potassium is slightly low. Be sure to increase intake of potassium rich foods. Please mail her a list. She is not prediabetic. Thyroid fxn is nl. Mg level is wnl. However, she will still benefit from magnesium supplementation. She should take 250-400mg  nightly.

## 2019-02-02 NOTE — Telephone Encounter (Signed)
Left the patient a message to call back for lab results. 

## 2019-02-04 ENCOUNTER — Encounter: Payer: Self-pay | Admitting: Cardiology

## 2019-02-04 ENCOUNTER — Other Ambulatory Visit: Payer: Self-pay

## 2019-02-04 ENCOUNTER — Ambulatory Visit (INDEPENDENT_AMBULATORY_CARE_PROVIDER_SITE_OTHER): Payer: PPO | Admitting: Cardiology

## 2019-02-04 VITALS — BP 155/78 | HR 71 | Ht 60.0 in | Wt 156.0 lb

## 2019-02-04 DIAGNOSIS — E7800 Pure hypercholesterolemia, unspecified: Secondary | ICD-10-CM

## 2019-02-04 DIAGNOSIS — I340 Nonrheumatic mitral (valve) insufficiency: Secondary | ICD-10-CM

## 2019-02-04 DIAGNOSIS — R002 Palpitations: Secondary | ICD-10-CM | POA: Diagnosis not present

## 2019-02-04 DIAGNOSIS — Z853 Personal history of malignant neoplasm of breast: Secondary | ICD-10-CM

## 2019-02-04 DIAGNOSIS — I10 Essential (primary) hypertension: Secondary | ICD-10-CM

## 2019-02-04 DIAGNOSIS — R7303 Prediabetes: Secondary | ICD-10-CM | POA: Diagnosis not present

## 2019-02-04 NOTE — Progress Notes (Signed)
Primary Physician:  Glendale Chard, MD   Patient ID: Martha Pham, female    DOB: 1951-03-16, 68 y.o.   MRN: [id number]  Subjective:    Chief Complaint  Patient presents with  . Palpitations  . New Patient (Initial Visit)    HPI: Martha Pham  is a 68 y.o. female  with hypertension, hyperlipidemia, sleep apnea not compliant with CPAP, type 2 diabetes, history of left breast cancer in 2012 status post chemotherapy and matectomy, vitamin D deficiency, referred to [place] for evaluation of palpitations by PCP, Dr. Baird Cancer.  She was previously seen by [place] in 2018 for palpitations.  She wore event monitor and symptoms of palpitations and chest pain correlated with normal sinus rhythm.  Symptoms had improved with initiation of beta-blocker therapy.  She also went echocardiogram in 2016 revealing mild MR.  She reports 1 month ago she began having fairly persistent palpitations that she mostly noticed at night.  Described as flip-flopping or skipping a beat.  No associated shortness of breath.  No heart racing or syncope. She was seen by her PCP, reports that she was given breathing exercises and has also stopped using caffeine.  For the last 2 weeks, symptoms have resolved.  She is overall feeling well without any complaints today.  She reports that she has history of prediabetes that has improved with 30 lb weight loss.  Does report gaining 10 pounds back over the last few months due to not being able to exercise at the [facility].  She has history of OSA, that she reports 2 years ago had negative sleep study and no longer uses CPAP.  Past Medical History:  Diagnosis Date  . Apnea, sleep    does wear cpap  . Arthritis   . Cancer ([place])    lt. breast ca-snbx  . Chronic kidney disease   . Contact lens/glasses fitting    wears contacts or glasses  . Heart murmur   . Hypercholesteremia   . Hyperlipemia   . Hypertension   . Lower back pain   . Malaise and fatigue   . Personal history of  chemotherapy 2013  . Sleep apnea    SLEEP STUDY DX 10,CPAP BUT DON'TUSE  . Vitamin D deficiency   . Wears dentures    upper    Past Surgical History:  Procedure Laterality Date  . BREAST BIOPSY Left 05/26/2011   malignant  . BREAST BIOPSY Right 10/29/2016  . BREAST IMPLANT EXCHANGE Left 09/23/2012   Procedure: REMOVAL OF LEFT EXPANDER/PLACEMENT OF IMPLANT WITH REDUCTION OF RIGHT BREAST;  Surgeon: Theodoro Kos, DO;  Location: [facility];  Service: Plastics;  Laterality: Left;  . BREAST LUMPECTOMY W/ NEEDLE LOCALIZATION  06/24/2011   left  . BREAST REDUCTION SURGERY Right 09/23/2012   Procedure: MAMMARY REDUCTION  (BREAST);  Surgeon: Theodoro Kos, DO;  Location: [facility];  Service: Plastics;  Laterality: Right;  . BREAST SURGERY  08/07/2011   re-exc. left breast margins  . CYST REMOVAL NECK  08/2013   Dr. Migdalia Dk  . LIPOSUCTION Bilateral 09/23/2012   Procedure: LIPOSUCTION;  Surgeon: Theodoro Kos, DO;  Location: [facility];  Service: Plastics;  Laterality: Bilateral;  . MASTECTOMY Left 2012  . MOUTH SURGERY     TOOTH IMPLANT BOTTOM X2 LFT  . PORT-A-CATH REMOVAL Right 09/23/2012   Procedure: REMOVAL PORT-A-CATH;  Surgeon: Theodoro Kos, DO;  Location: [facility];  Service: Plastics;  Laterality: Right;  . PORTACATH PLACEMENT  06/24/2011   Procedure: INSERTION PORT-A-CATH;  Surgeon: Judieth Keens, DO;  Location: [facility];  Service: General;  Laterality: Right;  Right mediport placement  . RECONSTRUCTION BREAST IMMEDIATE / DELAYED W/ TISSUE EXPANDER  10/13   left  . REDUCTION MAMMAPLASTY Right 2013  . SIMPLE MASTECTOMY  09/01/2011   left  . TONSILLECTOMY      Social History   Socioeconomic History  . Marital status: Widowed    Spouse name: Not on file  . Number of children: 1  . Years of education: 32  . Highest education level: Not on file  Occupational History  . Occupation: Retired  Scientific laboratory technician   . Financial resource strain: Not on file  . Food insecurity    Worry: Not on file    Inability: Not on file  . Transportation needs    Medical: Not on file    Non-medical: Not on file  Tobacco Use  . Smoking status: Never Smoker  . Smokeless tobacco: Never Used  Substance and Sexual Activity  . Alcohol use: Yes    Alcohol/week: 2.0 standard drinks    Types: 2 Glasses of wine per week    Comment: 8 oz occasionally per week  . Drug use: No  . Sexual activity: Yes    Partners: Male    Birth control/protection: Post-menopausal  Lifestyle  . Physical activity    Days per week: Not on file    Minutes per session: Not on file  . Stress: Not on file  Relationships  . Social Herbalist on phone: Not on file    Gets together: Not on file    Attends religious service: Not on file    Active member of club or organization: Not on file    Attends meetings of clubs or organizations: Not on file    Relationship status: Not on file  . Intimate partner violence    Fear of current or ex partner: Not on file    Emotionally abused: Not on file    Physically abused: Not on file    Forced sexual activity: Not on file  Other Topics Concern  . Not on file  Social History Narrative   Daughter [place], [place]    Review of Systems  Constitution: Negative for decreased appetite, malaise/fatigue, weight gain and weight loss.  Eyes: Negative for visual disturbance.  Cardiovascular: Negative for chest pain, claudication, dyspnea on exertion, leg swelling, orthopnea, palpitations and syncope.  Respiratory: Negative for hemoptysis and wheezing.   Endocrine: Negative for cold intolerance and heat intolerance.  Hematologic/Lymphatic: Does not bruise/bleed easily.  Skin: Negative for nail changes.  Musculoskeletal: Negative for muscle weakness and myalgias.  Gastrointestinal: Negative for abdominal pain, change in bowel habit, nausea and vomiting.  Neurological: Negative for difficulty  with concentration, dizziness, focal weakness and headaches.  Psychiatric/Behavioral: Negative for altered mental status and suicidal ideas.  All other systems reviewed and are negative.     Objective:  Blood pressure (!) 155/78, pulse 71, height 5' (1.524 m), weight 156 lb (70.8 kg), SpO2 98 %. Body mass index is 30.47 kg/m.    Physical Exam  Constitutional: She is oriented to person, place, and time. Vital signs are normal. She appears well-developed and well-nourished.  HENT:  Head: Normocephalic and atraumatic.  Neck: Normal range of motion.  Cardiovascular: Normal rate, regular rhythm and intact distal pulses.  Murmur heard.  Early systolic murmur is present with a grade of 2/6 at the  upper right sternal border and upper left sternal border. Pulmonary/Chest: Effort normal and breath sounds normal. No accessory muscle usage. No respiratory distress.  Abdominal: Soft. Bowel sounds are normal.  Musculoskeletal: Normal range of motion.  Neurological: She is alert and oriented to person, place, and time.  Skin: Skin is warm and dry.  Vitals reviewed.  Radiology: No results found.  Laboratory examination:   CMP Latest Ref Rng & Units 01/20/2019 09/22/2018 06/02/2018  Glucose 65 - 99 mg/dL 114(H) 91 100(H)  BUN 8 - 27 mg/dL 25 18 34(H)  Creatinine 0.57 - 1.00 mg/dL 0.99 0.93 1.19(H)  Sodium 134 - 144 mmol/L 143 139 136  Potassium 3.5 - 5.2 mmol/L 3.4(L) 3.5 3.0(L)  Chloride 96 - 106 mmol/L 103 102 95(L)  CO2 20 - 29 mmol/L 24 25 21   Calcium 8.7 - 10.3 mg/dL 9.4 10.0 10.4(H)  Total Protein 6.0 - 8.5 g/dL 7.0 - -  Total Bilirubin 0.0 - 1.2 mg/dL 0.5 - -  Alkaline Phos 39 - 117 IU/L 79 - -  AST 0 - 40 IU/L 14 - -  ALT 0 - 32 IU/L 13 - -   CBC Latest Ref Rng & Units 01/20/2019 02/26/2018 04/03/2015  WBC 3.4 - 10.8 x10E3/uL 5.2 6.9 4.2  Hemoglobin 11.1 - 15.9 g/dL 12.5 12.7 12.9  Hematocrit 34.0 - 46.6 % 37.7 37.4 39.1  Platelets 150 - 450 x10E3/uL 183 206 176   Lipid Panel      Component Value Date/Time   CHOL 169 01/20/2019 1051   TRIG 72 01/20/2019 1051   HDL 66 01/20/2019 1051   CHOLHDL 2.6 01/20/2019 1051   LDLCALC 89 01/20/2019 1051   HEMOGLOBIN A1C Lab Results  Component Value Date   HGBA1C 5.6 01/20/2019   TSH Recent Labs    01/20/19 1051  TSH 0.845    PRN Meds:. There are no discontinued medications. Current Meds  Medication Sig  . acetaminophen (TYLENOL) 500 MG tablet Take 500 mg by mouth every 6 (six) hours as needed for moderate pain.   Marland Kitchen atorvastatin (LIPITOR) 20 MG tablet Take 1 tablet (20 mg total) by mouth daily.  . calcium carbonate (OS-CAL) 600 MG TABS Take 600 mg by mouth 2 (two) times daily with a meal.    . Carboxymethylcellulose Sodium (REFRESH OP) Apply 1 drop to eye 2 (two) times daily as needed. For dry eyes  . fish oil-omega-3 fatty acids 1000 MG capsule Take 2 g by mouth every evening.   . gabapentin (NEURONTIN) 100 MG capsule Take 1 pill at bedtime as needed for leg pain.  . hydrochlorothiazide (HYDRODIURIL) 25 MG tablet Take 1 tablet (25 mg total) by mouth daily.  [place] Kitchen losartan (COZAAR) 100 MG tablet Take 1 tablet (100 mg total) by mouth daily.  . Magnesium 400 MG CAPS Take by mouth daily.  . metoprolol succinate (TOPROL-XL) 25 MG 24 hr tablet Take 1 tablet (25 mg total) by mouth daily.  . Vitamin D, Cholecalciferol, 50 MCG (2000 UT) CAPS Take by mouth.    Cardiac Studies:   Echo- 11/29/2014 1. Left ventricle cavity is normal in size. Normal global wall motion. Calculated EF 66%. 2. No aortic valve regurgitation noted. Mild aortic valve leaflet thickening. 3. Mild mitral regurgitation. 4. Mild tricuspid regurgitation. No evidence of pulmonary hypertension  Event Monitor 30 days 11/25/2016: Episodes of palpitations and chest pain reveal normal sinus rhythm. No significant arrhythmias.  Assessment:     ICD-10-CM   1. Palpitations  R00.2 EKG 12-Lead  2. Essential hypertension  I10 EKG 12-Lead  3. Prediabetes  R73.03    4. Pure hypercholesterolemia  E78.00   5. History of breast cancer  Z85.3   6. Mild mitral regurgitation  I34.0      EKG 02/04/2019: Normal sinus rhythm at 64 bpm, normal axis, no evidence of ischemia.   Recommendations:   Patient symptoms are fairly consistent with PAC or PVC.  I have reassured her.  Discussed life altering versus life-threatening.  Her symptoms have improved with coping with stress and cutting out caffeine use. I have discussed the many etiologies for PAC/PVC's and have advised her that she may periodically have exacerbations of her palpitations. Would recommend continuing with metoprolol. She is also taking fish oil that may also help with her palpitations. Her blood pressure was slightly elevated today, but generally is well controlled.  She will monitor this.  I have encouraged her to work to get recent 10 pound weight gain back off both to help with her risk factors and blood pressure control.  I have also discussed correlation of obesity with recurrence of breast cancer.  Fortunately she has been in remission since 2013.  She previously had mild MR on echocardiogram in 2016, felt to be within physiologic limits.  No changes are noted to her physical exam.  Do not feel that she needs repeat echocardiogram at this time given improvement in symptoms.  EKG is normal. Lipids are well controlled.  I have encouraged her to contact me should symptoms again worsen I will see her back on a as needed basis.   *I have discussed this case with Dr. Einar Gip and he personally examined the patient and participated in formulating the plan.*   Miquel Dunn, MSN, APRN, FNP-C [facility] [place] Office: [phone] Fax: [phone]

## 2019-02-04 NOTE — Patient Instructions (Signed)
Premature Ventricular Contraction  A premature ventricular contraction (PVC) is a common kind of irregular heartbeat (arrhythmia). These contractions are extra heartbeats that start in the ventricles of the heart and occur too early in the normal sequence. During the PVC, the heart's normal electrical pathway is not used, so the beat is shorter and less effective. In most cases, these contractions come and go and do not require treatment. What are the causes? Common causes of the condition include:  Smoking.  Drinking alcohol.  Certain medicines.  Some illegal drugs.  Stress.  Caffeine. Certain medical conditions can also cause PVCs:  Heart failure.  Heart attack, or coronary artery disease.  Heart valve problems.  Changes in minerals in the blood (electrolytes).  Low blood oxygen levels or high carbon dioxide levels. In many cases, the cause of this condition is not known. What are the signs or symptoms? The main symptom of this condition is fast or skipped heartbeats (palpitations). Other symptoms include:  Chest pain.  Shortness of breath.  Feeling tired.  Dizziness.  Difficulty exercising. In some cases, there are no symptoms. How is this diagnosed? This condition may be diagnosed based on:  Your medical history.  A physical exam. During the exam, the health care provider will check for irregular heartbeats.  Tests, such as: ? An ECG (electrocardiogram) to monitor the electrical activity of your heart. ? An ambulatory cardiac monitor. This device records your heartbeats for 24 hours or more. ? Stress tests to see how exercise affects your heart rhythm and blood supply. ? An echocardiogram. This test uses sound waves (ultrasound) to produce an image of your heart. ? An electrophysiology study (EPS). This test checks for electrical problems in your heart. How is this treated? Treatment for this condition depends on any underlying conditions, the type of PVCs  that you are having, and how much the symptoms are interfering with your daily life. Possible treatments include:  Avoiding things that cause premature contractions (triggers). These include caffeine and alcohol.  Taking medicines if symptoms are severe or if the extra heartbeats are frequent.  Getting treatment for underlying conditions that cause PVCs.  Having an implantable cardioverter defibrillator (ICD), if you are at risk for a serious arrhythmia. The ICD is a small device that is inserted into your chest to monitor your heartbeat. When it senses an irregular heartbeat, it sends a shock to bring the heartbeat back to normal.  Having a procedure to destroy the portion of the heart tissue that sends out abnormal signals (catheter ablation). In some cases, no treatment is required. Follow these instructions at home: Lifestyle  Do not use any products that contain nicotine or tobacco, such as cigarettes, e-cigarettes, and chewing tobacco. If you need help quitting, ask your health care provider.  Do not use illegal drugs.  Exercise regularly. Ask your health care provider what type of exercise is safe for you.  Try to get at least 7-9 hours of sleep each night, or as much as recommended by your health care provider.  Find healthy ways to manage stress. Avoid stressful situations when possible. Alcohol use  Do not drink alcohol if: ? Your health care provider tells you not to drink. ? You are pregnant, may be pregnant, or are planning to become pregnant. ? Alcohol triggers your episodes.  If you drink alcohol: ? Limit how much you use to:  0-1 drink a day for women.  0-2 drinks a day for men.  Be aware of how much  alcohol is in your drink. In the [place] one drink equals one 12 oz bottle of beer (355 mL), one 5 oz glass of wine (148 mL), or one 1 oz glass of hard liquor (44 mL). General instructions  Take over-the-counter and prescription medicines only as told by your  health care provider.  If caffeine triggers episodes of PVC, do not eat, drink, or use anything with caffeine in it.  Keep all follow-up visits as told by your health care provider. This is important. Contact a health care provider if you:  Feel palpitations. Get help right away if you:  Have chest pain.  Have shortness of breath.  Have sweating for no reason.  Have nausea and vomiting.  Become light-headed or you faint. Summary  A premature ventricular contraction (PVC) is a common kind of irregular heartbeat (arrhythmia).  In most cases, these contractions come and go and do not require treatment.  You may need to wear an ambulatory cardiac monitor. This records your heartbeats for 24 hours or more.  Treatment depends on any underlying conditions, the type of PVCs that you are having, and how much the symptoms are interfering with your daily life. This information is not intended to replace advice given to you by your health care provider. Make sure you discuss any questions you have with your health care provider. Document Released: 02/22/2004 Document Revised: 04/01/2018 Document Reviewed: 04/01/2018 Elsevier Patient Education  2020 [employer].

## 2019-02-15 ENCOUNTER — Other Ambulatory Visit: Payer: Self-pay | Admitting: Internal Medicine

## 2019-03-02 ENCOUNTER — Telehealth: Payer: Self-pay

## 2019-03-02 NOTE — Telephone Encounter (Signed)
The pt a message that I was retuning her call.

## 2019-03-03 ENCOUNTER — Telehealth: Payer: Self-pay

## 2019-03-03 NOTE — Telephone Encounter (Signed)
Left the pt a message that I was returning her call.

## 2019-03-17 DIAGNOSIS — D1801 Hemangioma of skin and subcutaneous tissue: Secondary | ICD-10-CM | POA: Diagnosis not present

## 2019-03-17 DIAGNOSIS — L821 Other seborrheic keratosis: Secondary | ICD-10-CM | POA: Diagnosis not present

## 2019-03-17 DIAGNOSIS — L910 Hypertrophic scar: Secondary | ICD-10-CM | POA: Diagnosis not present

## 2019-03-17 DIAGNOSIS — L720 Epidermal cyst: Secondary | ICD-10-CM | POA: Diagnosis not present

## 2019-03-17 DIAGNOSIS — L820 Inflamed seborrheic keratosis: Secondary | ICD-10-CM | POA: Diagnosis not present

## 2019-03-17 DIAGNOSIS — D225 Melanocytic nevi of trunk: Secondary | ICD-10-CM | POA: Diagnosis not present

## 2019-05-04 ENCOUNTER — Encounter: Payer: PPO | Admitting: Internal Medicine

## 2019-05-04 ENCOUNTER — Ambulatory Visit: Payer: PPO

## 2019-05-06 DIAGNOSIS — C50912 Malignant neoplasm of unspecified site of left female breast: Secondary | ICD-10-CM | POA: Diagnosis not present

## 2019-05-06 DIAGNOSIS — Z9011 Acquired absence of right breast and nipple: Secondary | ICD-10-CM | POA: Diagnosis not present

## 2019-05-09 ENCOUNTER — Other Ambulatory Visit: Payer: Self-pay | Admitting: Internal Medicine

## 2019-05-12 ENCOUNTER — Ambulatory Visit (INDEPENDENT_AMBULATORY_CARE_PROVIDER_SITE_OTHER): Payer: PPO | Admitting: Internal Medicine

## 2019-05-12 ENCOUNTER — Encounter: Payer: Self-pay | Admitting: Internal Medicine

## 2019-05-12 ENCOUNTER — Other Ambulatory Visit: Payer: Self-pay

## 2019-05-12 ENCOUNTER — Other Ambulatory Visit (HOSPITAL_COMMUNITY)
Admission: RE | Admit: 2019-05-12 | Discharge: 2019-05-12 | Disposition: A | Discharge status: Home / Self Care | Payer: PPO | Source: Ambulatory Visit | Attending: Internal Medicine | Admitting: Internal Medicine

## 2019-05-12 ENCOUNTER — Encounter: Payer: PPO | Admitting: Internal Medicine

## 2019-05-12 ENCOUNTER — Ambulatory Visit: Payer: PPO

## 2019-05-12 VITALS — BP 128/82 | HR 60 | Temp 98.7°F | Ht 59.2 in | Wt 161.8 lb

## 2019-05-12 DIAGNOSIS — I10 Essential (primary) hypertension: Secondary | ICD-10-CM | POA: Diagnosis not present

## 2019-05-12 DIAGNOSIS — Z0000 Encounter for general adult medical examination without abnormal findings: Secondary | ICD-10-CM

## 2019-05-12 DIAGNOSIS — E782 Mixed hyperlipidemia: Secondary | ICD-10-CM

## 2019-05-12 DIAGNOSIS — Z23 Encounter for immunization: Secondary | ICD-10-CM

## 2019-05-12 DIAGNOSIS — Z124 Encounter for screening for malignant neoplasm of cervix: Secondary | ICD-10-CM | POA: Insufficient documentation

## 2019-05-12 DIAGNOSIS — Z1211 Encounter for screening for malignant neoplasm of colon: Secondary | ICD-10-CM

## 2019-05-12 DIAGNOSIS — R7309 Other abnormal glucose: Secondary | ICD-10-CM

## 2019-05-12 LAB — POC HEMOCCULT BLD/STL (OFFICE/1-CARD/DIAGNOSTIC): Fecal Occult Blood, POC: NEGATIVE

## 2019-05-12 LAB — POCT URINALYSIS DIPSTICK
Bilirubin, UA: NEGATIVE
Blood, UA: NEGATIVE
Glucose, UA: NEGATIVE
Ketones, UA: NEGATIVE
Leukocytes, UA: NEGATIVE
Nitrite, UA: NEGATIVE
Protein, UA: NEGATIVE
Spec Grav, UA: 1.025 (ref 1.010–1.025)
Urobilinogen, UA: 0.2 E.U./dL
pH, UA: 5 (ref 5.0–8.0)

## 2019-05-12 MED ORDER — TETANUS-DIPHTH-ACELL PERTUSSIS 5-2-15.5 LF-MCG/0.5 IM SUSP: 0.5000 mL | Freq: Once | INTRAMUSCULAR | 0 refills | Status: AC

## 2019-05-12 NOTE — Progress Notes (Signed)
Subjective:     Patient ID: Martha Pham , female    DOB: 1951-01-14 , 68 y.o.   MRN: [id number]   Chief Complaint  Patient presents with  . Medicare Wellness    HPI Pt is here for Medicare wellness visit and complete physical. Would like to go ahead and have her pap done here as well. Last colonoscopy 2017, next due 2022. Mammogram and bone density  is due 06/2019. Last pap less than 5 years ago, and was normal then. Used to see GYN, but he retired. Last eye xm last year, next due this month.  She is behind on her dental check up, last one 3 years ago. Walks 4 times a week for 3 miles at a time.   Past Medical History:  Diagnosis Date  . Apnea, sleep    does wear cpap  . Arthritis   . Cancer ([place])    lt. breast ca-snbx  . Chronic kidney disease   . Contact lens/glasses fitting    wears contacts or glasses  . COPD (chronic obstructive pulmonary disease) (HCC)    brother  . Heart murmur   . Hypercholesteremia   . Hyperlipemia   . Hypertension   . Lower back pain   . Lung cancer ([place])   . Malaise and fatigue   . Personal history of chemotherapy 2013  . Sleep apnea    SLEEP STUDY DX 10,CPAP BUT DON'TUSE  . Vitamin D deficiency   . Wears dentures    upper     Family History  Problem Relation Age of Onset  . Brain cancer Father   . Heart attack Mother   . Breast cancer Sister 87  . Breast cancer Sister 30     Current Outpatient Medications:  .  acetaminophen (TYLENOL) 500 MG tablet, Take 500 mg by mouth every 6 (six) hours as needed for moderate pain. , Disp: , Rfl:  .  atorvastatin (LIPITOR) 20 MG tablet, Take 1 tablet by mouth once daily, Disp: 90 tablet, Rfl: 0 .  calcium carbonate (OS-CAL) 600 MG TABS, Take 600 mg by mouth 2 (two) times daily with a meal.  , Disp: , Rfl:  .  Carboxymethylcellulose Sodium (REFRESH OP), Apply 1 drop to eye 2 (two) times daily as needed. For dry eyes, Disp: , Rfl:  .  fish oil-omega-3 fatty acids 1000 MG capsule, Take 2 g by mouth  every evening. , Disp: , Rfl:  .  gabapentin (NEURONTIN) 100 MG capsule, Take 1 pill at bedtime as needed for leg pain., Disp: 90 capsule, Rfl: 3 .  hydrochlorothiazide (HYDRODIURIL) 25 MG tablet, Take 1 tablet by mouth once daily, Disp: 90 tablet, Rfl: 1 .  losartan (COZAAR) 100 MG tablet, Take 1 tablet (100 mg total) by mouth daily., Disp: 90 tablet, Rfl: 3 .  Magnesium 400 MG CAPS, Take by mouth daily., Disp: , Rfl:  .  metoprolol succinate (TOPROL-XL) 25 MG 24 hr tablet, Take 1 tablet (25 mg total) by mouth daily., Disp: 90 tablet, Rfl: 1 .  Vitamin D, Cholecalciferol, 50 MCG (2000 UT) CAPS, Take by mouth., Disp: , Rfl:  .  Tdap (ADACEL) 11-19-13.5 LF-MCG/0.5 injection, Inject 0.5 mLs into the muscle once for 1 dose., Disp: 0.5 mL, Rfl: 0   Allergies  Allergen Reactions  . Dust Mite Extract Itching  . Other     grass     Review of Systems  Gets numbness of both fingers when she gets up in the am  and last for a few minutes and resolves. X 1 month. The rest of 10 point ROS is neg.  Today's Vitals   05/12/19 0840  BP: 128/82  Pulse: 60  Temp: 98.7 F (37.1 C)  TempSrc: Oral  Weight: 161 lb 12.8 oz (73.4 kg)  Height: 4' 11.2" (1.504 m)   Body mass index is 32.46 kg/m.   Objective:  Physical Exam  BP 128/82 (BP Location: Right Arm, Patient Position: Sitting, Cuff Size: Normal)   Pulse 60   Temp 98.7 F (37.1 C) (Oral)   Ht 4' 11.2" (1.504 m)   Wt 161 lb 12.8 oz (73.4 kg)   BMI 32.46 kg/m   General Appearance:    Alert, cooperative, no distress, appears stated age  Head:    Normocephalic, without obvious abnormality, atraumatic  Eyes:    PERRL, conjunctiva/corneas clear, EOM's intact both eyes  Ears:    Normal TM's and external ear canals, both ears  Nose:   Nares normal, septum midline, mucosa normal, no drainage    or sinus tenderness  Throat:   Lips, mucosa, and tongue normal; teeth and gums normal  Neck:   Supple, symmetrical, trachea midline, no adenopathy;     thyroid:  no enlargement/tenderness/nodules; no carotid   bruit   Back:     Symmetric, no curvature, ROM normal, no CVA tenderness  Lungs:     Clear to auscultation bilaterally, respirations unlabored  Chest Wall:    No tenderness or deformity   Heart:    Regular rate and rhythm, S1 and S2 normal, no murmur, rub   or gallop  Breast Exam:    No tenderness, masses, or nipple abnormality on R breast. Has well healed scars on both. L breast tissue does not reveal any masses. No axillar nodes palpated. L breast/ chest tissue does not have a nipple.   Abdomen:     Soft, non-tender, bowel sounds active all four quadrants,    no masses, no organomegaly  Genitalia:    Normal female without lesion, discharge or tenderness. Her cervix seemed adhered to her vaginal cuff.   Rectal:    Normal tone, no masses or tenderness;   guaiac negative stool  Extremities:   Extremities normal, atraumatic, no cyanosis or edema  Pulses:   2+ and symmetric all extremities  Skin:   Skin color, texture, turgor normal, no rashes or lesions  Lymph nodes:   Cervical, supraclavicular, and axillary nodes normal  Neurologic:   CNII-XII intact, normal strength, sensation and reflexes    Throughout. Normal Romberg, tandem gait, heel and tip toe gait. Normal finger to nose.   EKG- NSR, unchanged compared to the 01/2018 one.     Assessment And Plan:     1. Essential hypertension- stable. May continue current med.  - POCT Urinalysis Dipstick (81002) - EKG 12-Lead - CMP14 + Anion Gap - CBC no Diff  2. Screening for cervical cancer- routine.  - Cytology -Pap Smear  3. Abnormal glucose- chornic - Hemoglobin A1c  4. Mixed hyperlipidemia- chronic.  - Lipid Profile - TSH - T3, free - T4, Free  5. Routine medical exam- routine. FU 1y - CMP14 + Anion Gap - CBC no Diff - Lipid Profile - TSH - T3, free - T4, Free - Hemoglobin A1c  6. Encounter for Medicare annual wellness exam- routine. Fu 1y  7. Need for  pneumococcal vaccination- routine - Pneumococcal polysaccharide vaccine 23-valent greater than or equal to 2yo subcutaneous/IM  8. Need for Tdap  vaccination- routine.  - Tdap (ADACEL) 11-19-13.5 LF-MCG/0.5 injection; Inject 0.5 mLs into the muscle once for 1 dose.  Dispense: 0.5 mL; Refill: 0   Santa Abdelrahman RODRIGUEZ-SOUTHWORTH, PA-C    THE PATIENT IS ENCOURAGED TO PRACTICE SOCIAL DISTANCING DUE TO THE COVID-19 PANDEMIC.     Subjective:   Martha Pham is a 68 y.o. female who presents for Medicare Annual (Subsequent) preventive examination.   Cardiac Risk Factors include: advanced age (>47men, >70 women);obesity (BMI >30kg/m2);Other (see comment), Risk factor comments: hx of prediabetes     Objective:     Vitals: BP 128/82 (BP Location: Right Arm, Patient Position: Sitting, Cuff Size: Normal)   Pulse 60   Temp 98.7 F (37.1 C) (Oral)   Ht 4' 11.2" (1.504 m)   Wt 161 lb 12.8 oz (73.4 kg)   BMI 32.46 kg/m   Body mass index is 32.46 kg/m.  Advanced Directives 05/12/2019 02/26/2018 10/02/2015 04/03/2015 10/03/2014 09/20/2012 04/30/2012  Does Patient Have a Medical Advance Directive? No No No No No Patient does not have advance directive Patient does not have advance directive  Would patient like information on creating a medical advance directive? Yes (Inpatient - patient requests chaplain consult to create a medical advance directive) - - No - patient declined information - - -  Pre-existing out of facility DNR order (yellow form or pink MOST form) - - - - - - -    Tobacco Social History   Tobacco Use  Smoking Status Never Smoker  Smokeless Tobacco Never Used      Past Medical History:  Diagnosis Date  . Apnea, sleep    does wear cpap  . Arthritis   . Cancer ([place])    lt. breast ca-snbx  . Chronic kidney disease   . Contact lens/glasses fitting    wears contacts or glasses  . COPD (chronic obstructive pulmonary disease) (HCC)    brother  . Heart murmur   . Hypercholesteremia    . Hyperlipemia   . Hypertension   . Lower back pain   . Lung cancer ([place])   . Malaise and fatigue   . Personal history of chemotherapy 2013  . Sleep apnea    SLEEP STUDY DX 10,CPAP BUT DON'TUSE  . Vitamin D deficiency   . Wears dentures    upper   Past Surgical History:  Procedure Laterality Date  . BREAST BIOPSY Left 05/26/2011   malignant  . BREAST BIOPSY Right 10/29/2016  . BREAST IMPLANT EXCHANGE Left 09/23/2012   Procedure: REMOVAL OF LEFT EXPANDER/PLACEMENT OF IMPLANT WITH REDUCTION OF RIGHT BREAST;  Surgeon: Theodoro Kos, DO;  Location: [facility];  Service: Plastics;  Laterality: Left;  . BREAST LUMPECTOMY W/ NEEDLE LOCALIZATION  06/24/2011   left  . BREAST REDUCTION SURGERY Right 09/23/2012   Procedure: MAMMARY REDUCTION  (BREAST);  Surgeon: Theodoro Kos, DO;  Location: [facility];  Service: Plastics;  Laterality: Right;  . BREAST SURGERY  08/07/2011   re-exc. left breast margins  . CYST REMOVAL NECK  08/2013   Dr. Migdalia Dk  . LIPOSUCTION Bilateral 09/23/2012   Procedure: LIPOSUCTION;  Surgeon: Theodoro Kos, DO;  Location: [facility];  Service: Plastics;  Laterality: Bilateral;  . MASTECTOMY Left 2012  . MOUTH SURGERY     TOOTH IMPLANT BOTTOM X2 LFT  . PORT-A-CATH REMOVAL Right 09/23/2012   Procedure: REMOVAL PORT-A-CATH;  Surgeon: Theodoro Kos, DO;  Location: [facility];  Service: Plastics;  Laterality: Right;  . PORTACATH  PLACEMENT  06/24/2011   Procedure: INSERTION PORT-A-CATH;  Surgeon: Judieth Keens, DO;  Location: [facility];  Service: General;  Laterality: Right;  Right mediport placement  . RECONSTRUCTION BREAST IMMEDIATE / DELAYED W/ TISSUE EXPANDER  10/13   left  . REDUCTION MAMMAPLASTY Right 2013  . SIMPLE MASTECTOMY  09/01/2011   left  . TONSILLECTOMY     Family History  Problem Relation Age of Onset  . Brain cancer Father   . Heart attack Mother   . Breast cancer Sister 2  .  Breast cancer Sister 56   Social History   Socioeconomic History  . Marital status: Widowed    Spouse name: Not on file  . Number of children: 1  . Years of education: 39  . Highest education level: Not on file  Occupational History  . Occupation: Retired  Scientific laboratory technician  . Financial resource strain: Not on file  . Food insecurity    Worry: Not on file    Inability: Not on file  . Transportation needs    Medical: Not on file    Non-medical: Not on file  Tobacco Use  . Smoking status: Never Smoker  . Smokeless tobacco: Never Used  Substance and Sexual Activity  . Alcohol use: Yes    Alcohol/week: 2.0 standard drinks    Types: 2 Glasses of wine per week    Comment: 8 oz occasionally per week  . Drug use: No  . Sexual activity: Yes    Partners: Male    Birth control/protection: Post-menopausal  Lifestyle  . Physical activity    Days per week: Not on file    Minutes per session: Not on file  . Stress: Not on file  Relationships  . Social Herbalist on phone: Not on file    Gets together: Not on file    Attends religious service: Not on file    Active member of club or organization: Not on file    Attends meetings of clubs or organizations: Not on file    Relationship status: Not on file  Other Topics Concern  . Not on file  Social History Narrative   Daughter [place], [place]    Outpatient Encounter Medications as of 05/12/2019  Medication Sig  . acetaminophen (TYLENOL) 500 MG tablet Take 500 mg by mouth every 6 (six) hours as needed for moderate pain.   Marland Kitchen atorvastatin (LIPITOR) 20 MG tablet Take 1 tablet by mouth once daily  . calcium carbonate (OS-CAL) 600 MG TABS Take 600 mg by mouth 2 (two) times daily with a meal.    . Carboxymethylcellulose Sodium (REFRESH OP) Apply 1 drop to eye 2 (two) times daily as needed. For dry eyes  . fish oil-omega-3 fatty acids 1000 MG capsule Take 2 g by mouth every evening.   . gabapentin (NEURONTIN) 100 MG capsule Take 1  pill at bedtime as needed for leg pain.  . hydrochlorothiazide (HYDRODIURIL) 25 MG tablet Take 1 tablet by mouth once daily  . losartan (COZAAR) 100 MG tablet Take 1 tablet (100 mg total) by mouth daily.  . Magnesium 400 MG CAPS Take by mouth daily.  . metoprolol succinate (TOPROL-XL) 25 MG 24 hr tablet Take 1 tablet (25 mg total) by mouth daily.  . Vitamin D, Cholecalciferol, 50 MCG (2000 UT) CAPS Take by mouth.  . Tdap (ADACEL) 11-19-13.5 LF-MCG/0.5 injection Inject 0.5 mLs into the muscle once for 1 dose.   No facility-administered encounter medications on  file as of 05/12/2019.     Activities of Daily Living In your present state of health, do you have any difficulty performing the following activities: 05/12/2019 05/12/2019  Hearing? N N  Vision? - N  Difficulty concentrating or making decisions? Y N  Comment a little remembering -  Walking or climbing stairs? N N  Dressing or bathing? N N  Doing errands, shopping? N N  Preparing Food and eating ? N -  Using the Toilet? N -  In the past six months, have you accidently leaked urine? Y -  Comment lately in the past 3 week, pre Dm caused it in past -  Do you have problems with loss of bowel control? N -  Managing your Medications? N -  Managing your Finances? N -  Housekeeping or managing your Housekeeping? N -  Some recent data might be hidden    Patient Care Team: Glendale Chard, MD as PCP - General (Internal Medicine)      Exercise Activities and Dietary recommendations Current Exercise Habits: Home exercise routine, Type of exercise: walking, Time (Minutes): 60, Frequency (Times/Week): 4, Weekly Exercise (Minutes/Week): 240, Intensity: Mild  Goals    . Weight (lb) < 200 lb (90.7 kg)     At least 20 lbs and increase her exercise       Fall Risk Fall Risk  05/12/2019 05/12/2019 01/20/2019 09/21/2018 06/22/2018  Falls in the past year? 0 0 0 0 0   Is the patient's home free of loose throw rugs in walkways, pet beds,  electrical cords, etc?   yes      Grab bars in the bathroom? NA      Handrails on the stairs?   yes      Adequate lighting?   yes  Timed Get Up and Go performed:  Less than 5 seconds  Depression Screen PHQ 2/9 Scores 05/12/2019 05/12/2019 01/20/2019 09/21/2018  PHQ - 2 Score 1 3 3  0  PHQ- 9 Score - 9 8 -     Cognitive Function- as noted.      6CIT Screen 05/12/2019  What Year? 0 points  What month? 0 points  What time? 0 points  Count back from 20 0 points  Months in reverse 0 points  Repeat phrase 8 points  Total Score 8    Immunization History  Administered Date(s) Administered  . Influenza, High Dose Seasonal PF 05/05/2018  . Influenza,inj,Quad PF,6+ Mos 04/04/2014  . Pneumococcal Polysaccharide-23 05/12/2019   Screening Tests Health Maintenance  Topic Date Due  . TETANUS/TDAP  11/29/1967  . INFLUENZA VACCINE  10/19/2019 (Originally 02/19/2019)  . OPHTHALMOLOGY EXAM  05/21/2019  . HEMOGLOBIN A1C  07/23/2019  . FOOT EXAM  05/11/2020  . MAMMOGRAM  07/05/2020  . COLONOSCOPY  12/02/2025  . DEXA SCAN  Completed  . Hepatitis C Screening  Completed  . PNA vac Low Risk Adult  Completed        Plan:  Will work on increasing her exercise and do better with her diet to help loose wt.   I have personally reviewed and noted the following in the patient's chart:   . Medical and social history . Use of alcohol, tobacco or illicit drugs  . Current medications and supplements . Functional ability and status . Nutritional status . Physical activity . Advanced directives . List of other physicians . Hospitalizations, surgeries, and ER visits in previous 12 months . Vitals . Screenings to include cognitive, depression, and falls . Referrals and  appointments  In addition, I have reviewed and discussed with patient certain preventive protocols, quality metrics, and best practice recommendations. A written personalized care plan for preventive services as well as general  preventive health recommendations were provided to patient.     Claudy Abdallah RODRIGUEZ-SOUTHWORTH, PA-C  05/12/2019

## 2019-05-12 NOTE — Patient Instructions (Signed)
Preventive Care 38 Years and Older, Female Preventive care refers to lifestyle choices and visits with your health care provider that can promote health and wellness. This includes:  A yearly physical exam. This is also called an annual well check.  Regular dental and eye exams.  Immunizations.  Screening for certain conditions.  Healthy lifestyle choices, such as diet and exercise. What can I expect for my preventive care visit? Physical exam Your health care provider will check:  Height and weight. These may be used to calculate body mass index (BMI), which is a measurement that tells if you are at a healthy weight.  Heart rate and blood pressure.  Your skin for abnormal spots. Counseling Your health care provider may ask you questions about:  Alcohol, tobacco, and drug use.  Emotional well-being.  Home and relationship well-being.  Sexual activity.  Eating habits.  History of falls.  Memory and ability to understand (cognition).  Work and work Statistician.  Pregnancy and menstrual history. What immunizations do I need?  Influenza (flu) vaccine  This is recommended every year. Tetanus, diphtheria, and pertussis (Tdap) vaccine  You may need a Td booster every 10 years. Varicella (chickenpox) vaccine  You may need this vaccine if you have not already been vaccinated. Zoster (shingles) vaccine  You may need this after age 33. Pneumococcal conjugate (PCV13) vaccine  One dose is recommended after age 33. Pneumococcal polysaccharide (PPSV23) vaccine  One dose is recommended after age 72. Measles, mumps, and rubella (MMR) vaccine  You may need at least one dose of MMR if you were born in 1957 or later. You may also need a second dose. Meningococcal conjugate (MenACWY) vaccine  You may need this if you have certain conditions. Hepatitis A vaccine  You may need this if you have certain conditions or if you travel or work in places where you may be exposed  to hepatitis A. Hepatitis B vaccine  You may need this if you have certain conditions or if you travel or work in places where you may be exposed to hepatitis B. Haemophilus influenzae type b (Hib) vaccine  You may need this if you have certain conditions. You may receive vaccines as individual doses or as more than one vaccine together in one shot (combination vaccines). Talk with your health care provider about the risks and benefits of combination vaccines. What tests do I need? Blood tests  Lipid and cholesterol levels. These may be checked every 5 years, or more frequently depending on your overall health.  Hepatitis C test.  Hepatitis B test. Screening  Lung cancer screening. You may have this screening every year starting at age 39 if you have a 30-pack-year history of smoking and currently smoke or have quit within the past 15 years.  Colorectal cancer screening. All adults should have this screening starting at age 36 and continuing until age 15. Your health care provider may recommend screening at age 23 if you are at increased risk. You will have tests every 1-10 years, depending on your results and the type of screening test.  Diabetes screening. This is done by checking your blood sugar (glucose) after you have not eaten for a while (fasting). You may have this done every 1-3 years.  Mammogram. This may be done every 1-2 years. Talk with your health care provider about how often you should have regular mammograms.  BRCA-related cancer screening. This may be done if you have a family history of breast, ovarian, tubal, or peritoneal cancers.  Other tests  Sexually transmitted disease (STD) testing.  Bone density scan. This is done to screen for osteoporosis. You may have this done starting at age 27. Follow these instructions at home: Eating and drinking  Eat a diet that includes fresh fruits and vegetables, whole grains, lean protein, and low-fat dairy products. Limit  your intake of foods with high amounts of sugar, saturated fats, and salt.  Take vitamin and mineral supplements as recommended by your health care provider.  Do not drink alcohol if your health care provider tells you not to drink.  If you drink alcohol: ? Limit how much you have to 0-1 drink a day. ? Be aware of how much alcohol is in your drink. In the [place] one drink equals one 12 oz bottle of beer (355 mL), one 5 oz glass of wine (148 mL), or one 1 oz glass of hard liquor (44 mL). Lifestyle  Take daily care of your teeth and gums.  Stay active. Exercise for at least 30 minutes on 5 or more days each week.  Do not use any products that contain nicotine or tobacco, such as cigarettes, e-cigarettes, and chewing tobacco. If you need help quitting, ask your health care provider.  If you are sexually active, practice safe sex. Use a condom or other form of protection in order to prevent STIs (sexually transmitted infections).  Talk with your health care provider about taking a low-dose aspirin or statin. What's next?  Go to your health care provider once a year for a well check visit.  Ask your health care provider how often you should have your eyes and teeth checked.  Stay up to date on all vaccines. This information is not intended to replace advice given to you by your health care provider. Make sure you discuss any questions you have with your health care provider. Document Released: 08/03/2015 Document Revised: 07/01/2018 Document Reviewed: 07/01/2018 Elsevier Patient Education  2020 [employer] , Thank you for taking time to come for your Medicare Wellness Visit. I appreciate your ongoing commitment to your health goals. Please review the following plan we discussed and let me know if I can assist you in the future.   These are the goals we discussed: Goals    . Weight (lb) < 200 lb (90.7 kg)     At least 20 lbs and increase her exercise       This is a  list of the screening recommended for you and due dates:  Health Maintenance  Topic Date Due  . Tetanus Vaccine  11/29/1967  . Pneumonia vaccines (2 of 2 - PPSV23) 11/28/2013  . Flu Shot  10/19/2019*  . Eye exam for diabetics  05/21/2019  . Hemoglobin A1C  07/23/2019  . Complete foot exam   05/11/2020  . Mammogram  07/05/2020  . Colon Cancer Screening  12/02/2025  . DEXA scan (bone density measurement)  Completed  .  Hepatitis C: One time screening is recommended by Center for Disease Control  (CDC) for  adults born from [birth year] through 1967.   Completed  *Topic was postponed. The date shown is not the original due date.

## 2019-05-13 LAB — CMP14 + ANION GAP
ALT: 18 IU/L (ref 0–32)
AST: 20 IU/L (ref 0–40)
Albumin/Globulin Ratio: 1.4 (ref 1.2–2.2)
Albumin: 4.4 g/dL (ref 3.8–4.8)
Alkaline Phosphatase: 91 IU/L (ref 39–117)
Anion Gap: 15 mmol/L (ref 10.0–18.0)
BUN/Creatinine Ratio: 20 (ref 12–28)
BUN: 19 mg/dL (ref 8–27)
Bilirubin Total: 0.5 mg/dL (ref 0.0–1.2)
CO2: 20 mmol/L (ref 20–29)
Calcium: 10 mg/dL (ref 8.7–10.3)
Chloride: 103 mmol/L (ref 96–106)
Creatinine, Ser: 0.96 mg/dL (ref 0.57–1.00)
GFR calc Af Amer: 69 mL/min/{1.73_m2} (ref >59)
GFR calc non Af Amer: 60 mL/min/{1.73_m2} (ref >59)
Globulin, Total: 3.1 g/dL (ref 1.5–4.5)
Glucose: 114 mg/dL — ABNORMAL HIGH (ref 65–99)
Potassium: 3.8 mmol/L (ref 3.5–5.2)
Sodium: 138 mmol/L (ref 134–144)
Total Protein: 7.5 g/dL (ref 6.0–8.5)

## 2019-05-13 LAB — LIPID PANEL
Chol/HDL Ratio: 2.6 ratio (ref 0.0–4.4)
Cholesterol, Total: 175 mg/dL (ref 100–199)
HDL: 68 mg/dL (ref >39)
LDL Chol Calc (NIH): 96 mg/dL (ref 0–99)
Triglycerides: 57 mg/dL (ref 0–149)
VLDL Cholesterol Cal: 11 mg/dL (ref 5–40)

## 2019-05-13 LAB — CBC
Hematocrit: 39.5 % (ref 34.0–46.6)
Hemoglobin: 13.2 g/dL (ref 11.1–15.9)
MCH: 32.8 pg (ref 26.6–33.0)
MCHC: 33.4 g/dL (ref 31.5–35.7)
MCV: 98 fL — ABNORMAL HIGH (ref 79–97)
Platelets: 199 10*3/uL (ref 150–450)
RBC: 4.03 x10E6/uL (ref 3.77–5.28)
RDW: 10.8 % — ABNORMAL LOW (ref 11.7–15.4)
WBC: 5.9 10*3/uL (ref 3.4–10.8)

## 2019-05-13 LAB — TSH: TSH: 0.983 u[IU]/mL (ref 0.450–4.500)

## 2019-05-13 LAB — T4, FREE: Free T4: 1.26 ng/dL (ref 0.82–1.77)

## 2019-05-13 LAB — HEMOGLOBIN A1C
Est. average glucose Bld gHb Est-mCnc: 114 mg/dL
Hgb A1c MFr Bld: 5.6 % (ref 4.8–5.6)

## 2019-05-13 LAB — T3, FREE: T3, Free: 3.1 pg/mL (ref 2.0–4.4)

## 2019-05-17 LAB — CYTOLOGY - PAP
Adequacy: ABSENT
Diagnosis: NEGATIVE

## 2019-05-19 ENCOUNTER — Telehealth: Payer: Self-pay

## 2019-05-19 NOTE — Telephone Encounter (Signed)
Rodriguez-Southworth, Sunday Spillers, PA-C  Candiss Norse T, CMA        Please inform pt that her pap was normal.    Spoke with pt gave pt provider message

## 2019-05-26 ENCOUNTER — Ambulatory Visit: Payer: PPO | Admitting: Internal Medicine

## 2019-06-06 DIAGNOSIS — E119 Type 2 diabetes mellitus without complications: Secondary | ICD-10-CM | POA: Diagnosis not present

## 2019-06-06 DIAGNOSIS — H2513 Age-related nuclear cataract, bilateral: Secondary | ICD-10-CM | POA: Diagnosis not present

## 2019-06-09 ENCOUNTER — Telehealth: Payer: Self-pay

## 2019-06-09 ENCOUNTER — Other Ambulatory Visit: Payer: Self-pay | Admitting: Internal Medicine

## 2019-06-09 NOTE — Telephone Encounter (Signed)
Spoke w/pt gave her provider message  Please inform her that all her labs are normal and her DM test is still in the upper normal range.

## 2019-06-09 NOTE — Progress Notes (Unsigned)
Please inform her that all her labs are normal and her DM test is still in the upper normal range.

## 2019-06-09 NOTE — Telephone Encounter (Signed)
Pt would like her lab results °

## 2019-06-13 ENCOUNTER — Other Ambulatory Visit: Payer: Self-pay | Admitting: Internal Medicine

## 2019-06-14 ENCOUNTER — Other Ambulatory Visit: Payer: Self-pay

## 2019-06-21 ENCOUNTER — Other Ambulatory Visit: Payer: Self-pay

## 2019-06-21 DIAGNOSIS — Z20822 Contact with and (suspected) exposure to covid-19: Secondary | ICD-10-CM

## 2019-06-23 ENCOUNTER — Telehealth: Payer: Self-pay

## 2019-06-23 LAB — NOVEL CORONAVIRUS, NAA: SARS-CoV-2, NAA: NOT DETECTED

## 2019-06-23 NOTE — Telephone Encounter (Signed)
Patient given negative result and verbalized understanding

## 2019-07-13 ENCOUNTER — Other Ambulatory Visit: Payer: Self-pay

## 2019-07-13 ENCOUNTER — Ambulatory Visit
Admission: RE | Admit: 2019-07-13 | Discharge: 2019-07-13 | Disposition: A | Discharge status: Home / Self Care | Payer: PPO | Source: Ambulatory Visit | Attending: Adult Health | Admitting: Adult Health

## 2019-07-13 DIAGNOSIS — C50212 Malignant neoplasm of upper-inner quadrant of left female breast: Secondary | ICD-10-CM

## 2019-07-13 DIAGNOSIS — Z1231 Encounter for screening mammogram for malignant neoplasm of breast: Secondary | ICD-10-CM | POA: Diagnosis not present

## 2019-07-13 DIAGNOSIS — Z1239 Encounter for other screening for malignant neoplasm of breast: Secondary | ICD-10-CM

## 2019-08-12 ENCOUNTER — Encounter: Payer: Self-pay | Admitting: Internal Medicine

## 2019-08-16 ENCOUNTER — Ambulatory Visit: Payer: PPO | Admitting: Internal Medicine

## 2019-09-12 ENCOUNTER — Ambulatory Visit: Payer: PPO | Admitting: Neurology

## 2019-09-21 ENCOUNTER — Other Ambulatory Visit: Payer: Self-pay

## 2019-09-21 MED ORDER — LOSARTAN POTASSIUM 100 MG PO TABS: 100.0000 mg | ORAL_TABLET | Freq: Every day | ORAL | 0 refills | Status: DC

## 2019-09-29 ENCOUNTER — Encounter: Payer: PPO | Admitting: Adult Health

## 2019-10-24 ENCOUNTER — Other Ambulatory Visit: Payer: Self-pay

## 2019-10-24 MED ORDER — HYDROCHLOROTHIAZIDE 25 MG PO TABS: 25.0000 mg | ORAL_TABLET | Freq: Every day | ORAL | 0 refills | Status: DC

## 2019-11-02 ENCOUNTER — Other Ambulatory Visit: Payer: Self-pay

## 2019-11-02 ENCOUNTER — Encounter: Payer: Self-pay | Admitting: Neurology

## 2019-11-02 ENCOUNTER — Ambulatory Visit: Payer: PPO | Admitting: Neurology

## 2019-11-02 VITALS — BP 142/78 | HR 66 | Temp 97.7°F | Ht 59.0 in | Wt 168.0 lb

## 2019-11-02 DIAGNOSIS — R0683 Snoring: Secondary | ICD-10-CM | POA: Diagnosis not present

## 2019-11-02 DIAGNOSIS — E669 Obesity, unspecified: Secondary | ICD-10-CM | POA: Diagnosis not present

## 2019-11-02 DIAGNOSIS — G2581 Restless legs syndrome: Secondary | ICD-10-CM | POA: Diagnosis not present

## 2019-11-02 DIAGNOSIS — G4719 Other hypersomnia: Secondary | ICD-10-CM | POA: Diagnosis not present

## 2019-11-02 MED ORDER — GABAPENTIN 100 MG PO CAPS: ORAL_CAPSULE | ORAL | 3 refills | Status: DC

## 2019-11-02 NOTE — Patient Instructions (Addendum)
As discussed, I will order a sleep study to reevaluate your sleep with a sleep study and to look for obstructive sleep apnea as you report increase in snoring and daytime sleepiness.  We will maintain treatment for restless legs with gabapentin as needed, I have renewed your prescription for 100 mg strength as needed at bedtime.  The sleep lab will be in touch soon to schedule your sleep study.

## 2019-11-02 NOTE — Progress Notes (Signed)
Subjective:    Patient ID: Martha Pham is a 69 y.o. female.  HPI     Interim history:   Martha Pham is a 69 year old right-handed woman with an underlying medical history of hyperlipidemia, hypertension, vitamin D deficiency, chronic kidney disease, breast cancer in 2012 (s/p lumpectomies, L mastectomy, and chemo), low back pain and obesity, who presents for follow-up consultation of her restless leg syndrome, on Rx with gabapentin prn. The patient is unaccompanied today and presents for her yearly checkup. I last saw her on 09/08/2018, at which time she reported doing well.  She had lost weight.  She was stable on low-dose gabapentin prn.  She was advised to follow-up in 1 year.  Today, 11/02/2019: She reports that her restless leg syndrome is under reasonable control with as needed use of low-dose gabapentin at night.  She feels stable in that regard.  She has noticed increase in her snoring lately and her family has commented on it.  She spent over 3 months in [place] to help with her nearly 77-year-old grandson with remote schooling since both her daughter and son-in-law work full-time.  She had a harder time getting a schedule there.  She does endorse daytime somnolence, Epworth sleepiness score is 16 out of 24.  She had a prior diagnosis of sleep apnea in the mild range in 2011.  When we checked her sleep study in 2016 she did not have any significant sleep apnea except for REM related sleep apnea.  She is interested in getting checked out again.  She is scheduled for her first dose of Covid vaccine tomorrow.    The patient's allergies, current medications, family history, past medical history, past social history, past surgical history and problem list were reviewed and updated as appropriate.    Previously (copied from previous notes for reference):      I saw her on 09/02/2017, at which time she reported that she eventually had stopped taking gabapentin. She did have some intermittent  leg discomfort, left side more than right. She was interested in restarting gabapentin as needed, which I prescribed.   Of note, she no showed for an appointment on 06/22/2017. I saw her on 06/19/2016, at which time she reported improvement in her leg pain. She had not been taking gabapentin regularly. She had improvement in her lower back pain after she went to the chiropractor. She was advised to follow-up routinely in one year and utilize gabapentin as needed.   I saw her on 06/05/2015, at which time she reported doing fairly well. She had noticed improvement of her restless leg symptoms on gabapentin. She had some low back pain, was not sure if the gabapentin was helpful for that as well. She was stable and I suggested a one-year checkup. She did not realize that she had refills on her gabapentin and I made sure she had enough refills to last her until her next appointment.   I saw her on 11/30/2014, at which time she reported feeling fairly stable. She had some low back pain with radiation to the left. She had occasional restless leg symptoms. She had no new complaints. I suggested a trial of low-dose gabapentin at night, 100 mg strength as needed for residual restless leg symptoms and to help improve her back discomfort.    I first met her on 10/31/2014 at the request of her primary care physician, at which time the patient reported a prior diagnosis of obstructive sleep apnea. I suggested we bring her back  for sleep study. She is a baseline sleep study on 11/06/2014 and underwent over her test results with her in detail today. Her sleep efficiency was reduced at 78.7% with a normal sleep latency and wake after sleep onset of 71.5 minutes with moderate to severe sleep fragmentation noted. She had an elevated arousal index. She had an increased percentage of stage I and stage II sleep, near absence of slow-wave sleep and a markedly decreased percentage of REM sleep at 3.9% with a very long REM latency.  She had mild PLMS at 13.9 per hour, with arousals. She had a normal EKG and EEG. She had mild to moderate intermittent snoring. Her total AHI was normal at 1.4 per hour, baseline oxygen saturation 96%, nadir was 90%. REM AHI was 26.1 per hour.   She was diagnosed with obstructive sleep apnea several years ago. She was placed on CPAP therapy. I reviewed her baseline sleep study results performed at the [facility] on 08/05/2009: Sleep efficiency was 91.4%. REM latency was 209 minutes. Arousal index was 19.1 per hour. Her total AHI was 9.3 per hour. Her REM AHI was 36.7 per hour. Average oxygen saturation was 96%, nadir was 82%. CPAP titration was recommended.    I reviewed her blood test results from 09/14/2014 through your office: TSH was unremarkable, hemoglobin A1c was 5.6, total cholesterol 177, HDL 58, LDL 97, CMP unremarkable, CBC with differential unremarkable very she had an EKG on 09/14/2014 which upon my review looked unremarkable.   She brought her CPAP machine but reports that she has not used it in the last few months because of air leaking. There is no SD card of the machine.    She has lack of energy and occasional AM HAs, occasional night time palpitations and nocturia x 2 on average. In February, her GYN noted a new heart murmur, but her during her appointment on 09/14/14 showed no murmur and EKG was fine.    Her bedtime is around 11:30 PM and typically she falls asleep quickly. Sometimes she wakes up in the middle of the night other than going to the bathroom. She has restless leg symptoms almost on a nightly basis but is not sure if she twitches or kicks in her sleep. She has no parasomnias. Her rise time is around 8 and she wakes up marginally rested on most days. She sleeps alone. She lives alone. She has one daughter who lives in the area but will be moving to [place] soon. The patient is retired and worked for [employer]. She is a nonsmoker and drinks alcohol  rarely. She drinks coffee maybe once a day.  Her Past Medical History Is Significant For: Past Medical History:  Diagnosis Date  . Apnea, sleep    does wear cpap  . Arthritis   . Cancer ([place])    lt. breast ca-snbx  . Chronic kidney disease   . Contact lens/glasses fitting    wears contacts or glasses  . COPD (chronic obstructive pulmonary disease) ([facility])    brother  . Heart murmur   . Hypercholesteremia   . Hyperlipemia   . Hypertension   . Lower back pain   . Lung cancer ([place])   . Malaise and fatigue   . Personal history of chemotherapy 2013  . Sleep apnea    SLEEP STUDY DX 10,CPAP BUT DON'TUSE  . Vitamin D deficiency   . Wears dentures    upper    Her Past Surgical History Is Significant For:  Past Surgical History:  Procedure Laterality Date  . BREAST BIOPSY Left 05/26/2011   malignant  . BREAST BIOPSY Right 10/29/2016  . BREAST IMPLANT EXCHANGE Left 09/23/2012   Procedure: REMOVAL OF LEFT EXPANDER/PLACEMENT OF IMPLANT WITH REDUCTION OF RIGHT BREAST;  Surgeon: Theodoro Kos, DO;  Location: [facility];  Service: Plastics;  Laterality: Left;  . BREAST LUMPECTOMY W/ NEEDLE LOCALIZATION  06/24/2011   left  . BREAST REDUCTION SURGERY Right 09/23/2012   Procedure: MAMMARY REDUCTION  (BREAST);  Surgeon: Theodoro Kos, DO;  Location: [facility];  Service: Plastics;  Laterality: Right;  . BREAST SURGERY  08/07/2011   re-exc. left breast margins  . CYST REMOVAL NECK  08/2013   Dr. Migdalia Dk  . LIPOSUCTION Bilateral 09/23/2012   Procedure: LIPOSUCTION;  Surgeon: Theodoro Kos, DO;  Location: [facility];  Service: Plastics;  Laterality: Bilateral;  . MASTECTOMY Left 2012  . MOUTH SURGERY     TOOTH IMPLANT BOTTOM X2 LFT  . PORT-A-CATH REMOVAL Right 09/23/2012   Procedure: REMOVAL PORT-A-CATH;  Surgeon: Theodoro Kos, DO;  Location: [facility];  Service: Plastics;  Laterality: Right;  . PORTACATH PLACEMENT  06/24/2011   Procedure:  INSERTION PORT-A-CATH;  Surgeon: Judieth Keens, DO;  Location: [facility];  Service: General;  Laterality: Right;  Right mediport placement  . RECONSTRUCTION BREAST IMMEDIATE / DELAYED W/ TISSUE EXPANDER  10/13   left  . REDUCTION MAMMAPLASTY Right 2013  . SIMPLE MASTECTOMY  09/01/2011   left  . TONSILLECTOMY      Her Family History Is Significant For: Family History  Problem Relation Age of Onset  . Brain cancer Father   . Heart attack Mother   . Breast cancer Sister 68  . Breast cancer Sister 44    Her Social History Is Significant For: Social History   Socioeconomic History  . Marital status: Widowed    Spouse name: Not on file  . Number of children: 1  . Years of education: 72  . Highest education level: Not on file  Occupational History  . Occupation: Retired  Tobacco Use  . Smoking status: Never Smoker  . Smokeless tobacco: Never Used  Substance and Sexual Activity  . Alcohol use: Yes    Alcohol/week: 2.0 standard drinks    Types: 2 Glasses of wine per week    Comment: 8 oz occasionally per week  . Drug use: No  . Sexual activity: Yes    Partners: Male    Birth control/protection: Post-menopausal  Other Topics Concern  . Not on file  Social History Narrative   Daughter [place], [place]   Social Determinants of Health   Financial Resource Strain:   . Difficulty of Paying Living Expenses:   Food Insecurity:   . Worried About Charity fundraiser in the Last Year:   . Arboriculturist in the Last Year:   Transportation Needs:   . Film/video editor (Medical):   Marland Kitchen Lack of Transportation (Non-Medical):   Physical Activity:   . Days of Exercise per Week:   . Minutes of Exercise per Session:   Stress:   . Feeling of Stress :   Social Connections:   . Frequency of Communication with Friends and Family:   . Frequency of Social Gatherings with Friends and Family:   . Attends Religious Services:   . Active Member of Clubs or  Organizations:   . Attends Archivist Meetings:   .  Marital Status:     Her Allergies Are:  Allergies  Allergen Reactions  . Dust Mite Extract Itching  . Other     grass  :   Her Current Medications Are:  Outpatient Encounter Medications as of 11/02/2019  Medication Sig  . acetaminophen (TYLENOL) 500 MG tablet Take 500 mg by mouth every 6 (six) hours as needed for moderate pain.   Marland Kitchen atorvastatin (LIPITOR) 20 MG tablet Take 1 tablet by mouth once daily  . calcium carbonate (OS-CAL) 600 MG TABS Take 600 mg by mouth 2 (two) times daily with a meal.    . Carboxymethylcellulose Sodium (REFRESH OP) Apply 1 drop to eye 2 (two) times daily as needed. For dry eyes  . fish oil-omega-3 fatty acids 1000 MG capsule Take 2 g by mouth every evening.   . gabapentin (NEURONTIN) 100 MG capsule Take 1 pill at bedtime as needed for leg pain.  . hydrochlorothiazide (HYDRODIURIL) 25 MG tablet Take 1 tablet (25 mg total) by mouth daily.  Marland Kitchen losartan (COZAAR) 100 MG tablet Take 1 tablet (100 mg total) by mouth daily.  . Magnesium 400 MG CAPS Take by mouth daily.  . metoprolol succinate (TOPROL-XL) 25 MG 24 hr tablet Take 1 tablet by mouth once daily  . Vitamin D, Cholecalciferol, 50 MCG (2000 UT) CAPS Take by mouth.   No facility-administered encounter medications on file as of 11/02/2019.  :  Review of Systems:  Out of a complete 14 point review of systems, all are reviewed and negative with the exception of these symptoms as listed below:  Review of Systems  Neurological:       Room 2. Alone. She is here for yearly follow up of RLS. Feels gabapentin 181m at bedtime works well. She does have concerns over starting to snore again and feeling tired throughout the day.//mck,rn  Epworth Sleepiness Scale 0= would never doze 1= slight chance of dozing 2= moderate chance of dozing 3= high chance of dozing  Sitting and reading:3 Watching TV:3 Sitting inactive in a public place (ex. Theater or  meeting):2 As a passenger in a car for an hour without a break:2 Lying down to rest in the afternoon:3 Sitting and talking to someone:1 Sitting quietly after lunch (no alcohol):2 In a car, while stopped in traffic:0 Total:17    Objective:  Neurological Exam  Physical Exam Physical Examination:   Vitals:   11/02/19 1129  BP: (!) 142/78  Pulse: 66  Temp: 97.7 F (36.5 C)   General Examination: The patient is a very pleasant 69y.o. female in no acute distress. She appears well-developed and well-nourished and well groomed.   HEENT:Normocephalic, atraumatic, pupils are equal, round and reactive to light and accommodation. Corrective eyeglasses in place. Extraocular tracking is good, hearing is grossly intact. Face is symmetric. Speech is clear, no dysarthria, no voice tremor. Airway examination shows mild mouth dryness, no significant crowding. Tongue protrudes centrally and palate elevates symmetrically.  Chest:Clear to auscultation without wheezing, rhonchi or crackles noted.  Heart:S1+S2+0, regular and normal without murmurs, rubs or gallops noted. Perhaps a transient systolic flow murmur noted.   Abdomen:Soft, non-tender and non-distended.  Extremities:There is no pitting edema in the distal lower extremities bilaterally.   Skin: Warm and dry without trophic changes noted.  Musculoskeletal: exam reveals no obvious joint deformities, tenderness or joint swelling or erythema.   Neurologically:  Mental status: The patient is awake, alert and oriented in all 4 spheres. Her immediate and remote memory, attention, language skills  and fund of knowledge are appropriate. There is no evidence of aphasia, agnosia, apraxia or anomia. Speech is clear with normal prosody and enunciation. Thought process is linear. Mood is normal and affect is normal.  Cranial nerves II - XII are as described above under HEENT exam.  Motor exam: Normal bulk, strength and tone is noted. There is  no drift, tremor or rebound. Fine motor skills and coordination:grosslyintact.  Cerebellar testing: No dysmetria or intention tremor. There is no truncal or gait ataxia.  Sensory exam: intact to light touch in the upper and lower extremities.  Gait, station and balance: She stands easily. No veering to one side is noted. No leaning to one side is noted. Posture is age-appropriate and stance is narrow based. Gait shows normal stride length and normal pace. No problems turning are noted.  Assessment and Plan:   In summary, Martha Pham is a very pleasant 69 year old female with an underlying medical history of hyperlipidemia, hypertension, vitamin D deficiency, chronic kidney disease, low back pain and mild obesity, who presents for follow-up consultation of her restless leg syndrome. She has intermittent symptoms and has had good results with gabapentin as needed. She carries a prior diagnosis of mild sleep apnea which was diagnosed in 2011 but her sleep study with [place] in April 2016 did not confirm any significant sleep disordered breathing. She has had weight fluctuation.  She has noticed an increase in her snoring and this has been noted by her family when she was visiting her daughter in [place].  Patient also endorses increase in daytime somnolence.  We mutually agreed to pursue a sleep study to reevaluate her sleep disordered breathing and snoring.  She is stable on gabapentin, I renewed her prescription for 100 mg strength nightly as needed.  She is advised that we will call her to schedule her sleep study and keep her posted as to the test results afterwards.  We may consider CPAP or AutoPap therapy. I answered all her questions today and she was in agreement with the plan.   I spent 30 minutes in total face-to-face time and in reviewing records during pre-charting, more than 50% of which was spent in counseling and coordination of care, reviewing test results, reviewing medications and  treatment regimen and/or in discussing or reviewing the diagnosis of RLS, snoring, EDS, the prognosis and treatment options. Pertinent laboratory and imaging test results that were available during this visit with the patient were reviewed by me and considered in my medical decision making (see chart for details).

## 2019-11-03 ENCOUNTER — Ambulatory Visit: Payer: PPO | Attending: Internal Medicine

## 2019-11-03 DIAGNOSIS — Z23 Encounter for immunization: Secondary | ICD-10-CM

## 2019-11-03 NOTE — Progress Notes (Signed)
   Covid-19 Vaccination Clinic  Name:  Martha Pham    MRN: [id number] DOB: 1951/06/24  11/03/2019  Martha Pham was observed post Covid-19 immunization for 15 minutes without incident. She was provided with Vaccine Information Sheet and instruction to access the V-Safe system.   Martha Pham was instructed to call 911 with any severe reactions post vaccine: Marland Kitchen Difficulty breathing  . Swelling of face and throat  . A fast heartbeat  . A bad rash all over body  . Dizziness and weakness   Immunizations Administered    Name Date Dose VIS Date Route   Pfizer COVID-19 Vaccine 11/03/2019  8:21 AM 0.3 mL 07/01/2019 Intramuscular   Manufacturer: [place]   Lot: [id number]   [place]: [id number]

## 2019-11-18 ENCOUNTER — Other Ambulatory Visit: Payer: Self-pay

## 2019-11-18 ENCOUNTER — Inpatient Hospital Stay: Payer: PPO | Attending: Adult Health | Admitting: Adult Health

## 2019-11-18 VITALS — BP 142/79 | HR 65 | Temp 97.8°F | Resp 18 | Ht 59.0 in | Wt 164.8 lb

## 2019-11-18 DIAGNOSIS — G4730 Sleep apnea, unspecified: Secondary | ICD-10-CM | POA: Diagnosis not present

## 2019-11-18 DIAGNOSIS — Z79899 Other long term (current) drug therapy: Secondary | ICD-10-CM | POA: Insufficient documentation

## 2019-11-18 DIAGNOSIS — J449 Chronic obstructive pulmonary disease, unspecified: Secondary | ICD-10-CM | POA: Insufficient documentation

## 2019-11-18 DIAGNOSIS — Z9221 Personal history of antineoplastic chemotherapy: Secondary | ICD-10-CM | POA: Diagnosis not present

## 2019-11-18 DIAGNOSIS — Z9012 Acquired absence of left breast and nipple: Secondary | ICD-10-CM | POA: Insufficient documentation

## 2019-11-18 DIAGNOSIS — Z170 Estrogen receptor positive status [ER+]: Secondary | ICD-10-CM | POA: Diagnosis not present

## 2019-11-18 DIAGNOSIS — E785 Hyperlipidemia, unspecified: Secondary | ICD-10-CM | POA: Diagnosis not present

## 2019-11-18 DIAGNOSIS — Z171 Estrogen receptor negative status [ER-]: Secondary | ICD-10-CM | POA: Insufficient documentation

## 2019-11-18 DIAGNOSIS — C50212 Malignant neoplasm of upper-inner quadrant of left female breast: Secondary | ICD-10-CM

## 2019-11-18 DIAGNOSIS — Z803 Family history of malignant neoplasm of breast: Secondary | ICD-10-CM | POA: Insufficient documentation

## 2019-11-18 DIAGNOSIS — Z853 Personal history of malignant neoplasm of breast: Secondary | ICD-10-CM | POA: Diagnosis not present

## 2019-11-18 DIAGNOSIS — N189 Chronic kidney disease, unspecified: Secondary | ICD-10-CM | POA: Diagnosis not present

## 2019-11-18 DIAGNOSIS — I129 Hypertensive chronic kidney disease with stage 1 through stage 4 chronic kidney disease, or unspecified chronic kidney disease: Secondary | ICD-10-CM | POA: Insufficient documentation

## 2019-11-18 NOTE — Progress Notes (Signed)
CLINIC:  Survivorship   REASON FOR VISIT:  Routine follow-up for history of breast cancer.   BRIEF ONCOLOGIC HISTORY:  Oncology History  Breast cancer of upper-inner quadrant of left female breast ([facility] [place])  06/12/2011 Initial Diagnosis   Cancer of upper-inner quadrant of female breast: Grade 3 IDC ER negative PR negative HER-2 negative, MRI 2.7 x 2.3 x 1 cm mass   06/24/2011 Surgery   Left breast lumpectomy 2.2 cm IDC grade 3 with high-grade DCIS triple negative, for positive margins patient had reexcision: 2 lymph nodes negative ER/PR negative HER-2 negative ratio 0.97 Ki-67 75%   09/02/2011 Surgery   Left mastectomy   10/01/2011 - 12/03/2011 Chemotherapy   Adjuvant Taxotere and Cytoxan x4 cycles   07/05/2012 Surgery   Breast reconstruction on the left breast with reduction of right breast      INTERVAL HISTORY:  Martha Pham presents to the [facility] today for routine follow-up for her history of breast cancer.  Sh  Her most recent right brteast mammogram was normal and showed breast density category A.  She went out to [place] to help her daughter with her grandson's virtual learning.    Martha Pham contiues to have right rib pain and itching on the right side.  An xray of the ribs was negative.  Her most recent labs were completed in 04/2019 and showed normal LFTs.  REVIEW OF SYSTEMS:  Review of Systems  Constitutional: Negative for appetite change, chills, fatigue, fever and unexpected weight change.  HENT:   Negative for hearing loss, lump/mass, mouth sores, sore throat and trouble swallowing.   Eyes: Negative for eye problems and icterus.  Respiratory: Negative for chest tightness, cough and shortness of breath.   Cardiovascular: Negative for chest pain, leg swelling and palpitations.  Gastrointestinal: Negative for abdominal distention, abdominal pain, constipation, diarrhea, nausea and vomiting.  Endocrine: Negative for hot flashes.  Genitourinary: Negative for  difficulty urinating.   Musculoskeletal: Negative for arthralgias.  Skin: Negative for itching and rash.  Neurological: Negative for dizziness, extremity weakness, headaches and numbness.  Hematological: Negative for adenopathy. Does not bruise/bleed easily.  Psychiatric/Behavioral: Negative for depression. The patient is not nervous/anxious.   Breast: Denies any new nodularity, masses, tenderness, nipple changes, or nipple discharge.     PAST MEDICAL/SURGICAL HISTORY:  Past Medical History:  Diagnosis Date  . Apnea, sleep    does wear cpap  . Arthritis   . Cancer ([place])    lt. breast ca-snbx  . Chronic kidney disease   . Contact lens/glasses fitting    wears contacts or glasses  . COPD (chronic obstructive pulmonary disease) (HCC)    brother  . Heart murmur   . Hypercholesteremia   . Hyperlipemia   . Hypertension   . Lower back pain   . Lung cancer ([place])   . Malaise and fatigue   . Personal history of chemotherapy 2013  . Sleep apnea    SLEEP STUDY DX 10,CPAP BUT DON'TUSE  . Vitamin D deficiency   . Wears dentures    upper   Past Surgical History:  Procedure Laterality Date  . BREAST BIOPSY Left 05/26/2011   malignant  . BREAST BIOPSY Right 10/29/2016  . BREAST IMPLANT EXCHANGE Left 09/23/2012   Procedure: REMOVAL OF LEFT EXPANDER/PLACEMENT OF IMPLANT WITH REDUCTION OF RIGHT BREAST;  Surgeon: Theodoro Kos, DO;  Location: [facility];  Service: Plastics;  Laterality: Left;  . BREAST LUMPECTOMY W/ NEEDLE LOCALIZATION  06/24/2011   left  . BREAST  REDUCTION SURGERY Right 09/23/2012   Procedure: MAMMARY REDUCTION  (BREAST);  Surgeon: Theodoro Kos, DO;  Location: [facility];  Service: Plastics;  Laterality: Right;  . BREAST SURGERY  08/07/2011   re-exc. left breast margins  . CYST REMOVAL NECK  08/2013   Dr. Migdalia Dk  . LIPOSUCTION Bilateral 09/23/2012   Procedure: LIPOSUCTION;  Surgeon: Theodoro Kos, DO;  Location: [facility];   Service: Plastics;  Laterality: Bilateral;  . MASTECTOMY Left 2012  . MOUTH SURGERY     TOOTH IMPLANT BOTTOM X2 LFT  . PORT-A-CATH REMOVAL Right 09/23/2012   Procedure: REMOVAL PORT-A-CATH;  Surgeon: Theodoro Kos, DO;  Location: [facility];  Service: Plastics;  Laterality: Right;  . PORTACATH PLACEMENT  06/24/2011   Procedure: INSERTION PORT-A-CATH;  Surgeon: Judieth Keens, DO;  Location: [facility];  Service: General;  Laterality: Right;  Right mediport placement  . RECONSTRUCTION BREAST IMMEDIATE / DELAYED W/ TISSUE EXPANDER  10/13   left  . REDUCTION MAMMAPLASTY Right 2013  . SIMPLE MASTECTOMY  09/01/2011   left  . TONSILLECTOMY       ALLERGIES:  Allergies  Allergen Reactions  . Dust Mite Extract Itching  . Other     grass     CURRENT MEDICATIONS:  Outpatient Encounter Medications as of 11/18/2019  Medication Sig  . acetaminophen (TYLENOL) 500 MG tablet Take 500 mg by mouth every 6 (six) hours as needed for moderate pain.   Marland Kitchen atorvastatin (LIPITOR) 20 MG tablet Take 1 tablet by mouth once daily  . calcium carbonate (OS-CAL) 600 MG TABS Take 600 mg by mouth 2 (two) times daily with a meal.    . Carboxymethylcellulose Sodium (REFRESH OP) Apply 1 drop to eye 2 (two) times daily as needed. For dry eyes  . fish oil-omega-3 fatty acids 1000 MG capsule Take 2 g by mouth every evening.   . gabapentin (NEURONTIN) 100 MG capsule Take 1 pill at bedtime as needed for leg pain.  . hydrochlorothiazide (HYDRODIURIL) 25 MG tablet Take 1 tablet (25 mg total) by mouth daily.  Marland Kitchen losartan (COZAAR) 100 MG tablet Take 1 tablet (100 mg total) by mouth daily.  . Magnesium 400 MG CAPS Take by mouth daily.  . metoprolol succinate (TOPROL-XL) 25 MG 24 hr tablet Take 1 tablet by mouth once daily  . Vitamin D, Cholecalciferol, 50 MCG (2000 UT) CAPS Take by mouth.   No facility-administered encounter medications on file as of 11/18/2019.     ONCOLOGIC FAMILY HISTORY:   Family History  Problem Relation Age of Onset  . Brain cancer Father   . Heart attack Mother   . Breast cancer Sister 69  . Breast cancer Sister 57    GENETIC COUNSELING/TESTING: Done at [facility], negative  SOCIAL HISTORY:  Martha Pham is single and lives alone in [place], [place].  Martha Pham is currently retired.  She denies any current or history of tobacco, alcohol, or illicit drug use.  She has been traveling to [place] to help with her grandson.  (April 2021 updated)   PHYSICAL EXAMINATION:  Vital Signs: Vitals:   11/18/19 1124  BP: (!) 142/79  Pulse: 65  Resp: 18  Temp: 97.8 F (36.6 C)  SpO2: 100%   Filed Weights   11/18/19 1124  Weight: 164 lb 12.8 oz (74.8 kg)   General: Well-nourished, well-appearing female in no acute distress.  Unaccompanied today.   HEENT: Head is normocephalic.  Pupils equal and reactive  to light. Conjunctivae clear without exudate.  Sclerae anicteric. Oral mucosa is pink, moist.  Oropharynx is pink without lesions or erythema.  Lymph: No cervical, supraclavicular, or infraclavicular lymphadenopathy noted on palpation.  Cardiovascular: Regular rate and rhythm.Marland Kitchen Respiratory: Clear to auscultation bilaterally. Chest expansion symmetric; breathing non-labored.  Breast Exam:  -Left breast: s/p mastectomy and reconstruction no sign of local recurrence -right breast: s/p reduction, scars present, but no nodules/masses or signs of cancer. -Axilla: No axillary adenopathy bilaterally.  GI: Abdomen soft and round; non-tender, non-distended. Bowel sounds normoactive. No hepatosplenomegaly.   GU: Deferred.  Neuro: No focal deficits. Steady gait.  Psych: Mood and affect normal and appropriate for situation.  MSK: No focal spinal tenderness to palpation, full range of motion in bilateral upper extremities. Extremities: No edema. Skin: Warm and dry. Multiple nevi, several seborrheic keratoses on skin as well  LABORATORY DATA:  None for  this visit   DIAGNOSTIC IMAGING:  Most recent mammogram:  CLINICAL DATA:  Screening.  EXAM: DIGITAL SCREENING UNILATERAL RIGHT MAMMOGRAM WITH CAD AND TOMO  COMPARISON:  Previous exam(s).  ACR Breast Density Category a: The breast tissue is almost entirely fatty.  FINDINGS: The patient has had a left mastectomy. There are no findings suspicious for malignancy. Images were processed with CAD.  IMPRESSION: No mammographic evidence of malignancy. A result letter of this screening mammogram will be mailed directly to the patient.  RECOMMENDATION: Screening mammogram in one year.  (Code:SM-R-56M)  BI-RADS CATEGORY  1: Negative.   Electronically Signed   By: Nolon Nations M.D.   On: 07/13/2019 11:01    ASSESSMENT AND PLAN:  Ms.. Mijangos is a pleasant 69 y.o. female with history of Stage IIA left breast invasive ductal carcinoma, ER-/PR-/HER2-, diagnosed in 05/2011, treated with lumpectomy followed by mastectomy, and adjuvant chemotherapy.  She [facility] for surveillance and routine follow-up.   1. History of breast cancer:  Ms. Meanor is currently clinically and radiographically without evidence of disease or recurrence of breast cancer. She will be due for mammogram in 06/2020.  We will see her back in one year for LTS surveillance and monitoring.  I encouraged her to call me with any questions or concerns before her next visit at the cancer center, and I would be happy to see her sooner, if needed.    2. Right UQ discomfort: We will get CT chest, abdomen, and pelvis to evaluate.   3. Scars: She has some keloid formation at her scars from her surgery.  I sent Dr. Marla Roe a message to see if she can help her with these.    4. Bone health:  Given Ms. Gauer age, history of breast cancer, she is at risk for bone demineralization. She was given education on specific food and activities to promote bone health.  5. Cancer screening:  Due to Ms.  Wands's history and her age, she should receive screening for skin cancers, colon cancer, and gynecologic cancers. She was encouraged to follow-up with her PCP for appropriate cancer screenings.   6. Health maintenance and wellness promotion: Ms. Callaway was encouraged to consume 5-7 servings of fruits and vegetables per day. She was also encouraged to engage in moderate to vigorous exercise for 30 minutes per day most days of the week. She was instructed to limit her alcohol consumption and continue to abstain from tobacco use.   Dispo:  -Return to cancer center in one year for LTS follow up -Mammogram in 06/2020 when due -Ct chest  abdomen and pelvis  Total encounter time: 20 minutes*  Gardenia Phlegm, NP [facility] [phone]  *Total Encounter Time as defined by the Centers for Medicare and Medicaid Services includes, in addition to the face-to-face time of a patient visit (documented in the note above) non-face-to-face time: obtaining and reviewing outside history, ordering and reviewing medications, tests or procedures, care coordination (communications with other health care professionals or caregivers) and documentation in the medical record.   Note: PRIMARY CARE PROVIDER Glendale Chard, [place] [phone]

## 2019-11-21 ENCOUNTER — Encounter: Payer: Self-pay | Admitting: Internal Medicine

## 2019-11-21 ENCOUNTER — Other Ambulatory Visit: Payer: Self-pay

## 2019-11-21 ENCOUNTER — Ambulatory Visit (INDEPENDENT_AMBULATORY_CARE_PROVIDER_SITE_OTHER): Payer: PPO | Admitting: Internal Medicine

## 2019-11-21 VITALS — BP 118/66 | HR 70 | Temp 98.1°F | Ht 59.0 in | Wt 166.4 lb

## 2019-11-21 DIAGNOSIS — E6609 Other obesity due to excess calories: Secondary | ICD-10-CM

## 2019-11-21 DIAGNOSIS — N182 Chronic kidney disease, stage 2 (mild): Secondary | ICD-10-CM | POA: Diagnosis not present

## 2019-11-21 DIAGNOSIS — E7800 Pure hypercholesterolemia, unspecified: Secondary | ICD-10-CM | POA: Diagnosis not present

## 2019-11-21 DIAGNOSIS — E1122 Type 2 diabetes mellitus with diabetic chronic kidney disease: Secondary | ICD-10-CM

## 2019-11-21 DIAGNOSIS — R7309 Other abnormal glucose: Secondary | ICD-10-CM | POA: Diagnosis not present

## 2019-11-21 DIAGNOSIS — Z6833 Body mass index (BMI) 33.0-33.9, adult: Secondary | ICD-10-CM | POA: Diagnosis not present

## 2019-11-21 DIAGNOSIS — L910 Hypertrophic scar: Secondary | ICD-10-CM

## 2019-11-21 DIAGNOSIS — I129 Hypertensive chronic kidney disease with stage 1 through stage 4 chronic kidney disease, or unspecified chronic kidney disease: Secondary | ICD-10-CM | POA: Diagnosis not present

## 2019-11-21 DIAGNOSIS — I10 Essential (primary) hypertension: Secondary | ICD-10-CM | POA: Diagnosis not present

## 2019-11-21 MED ORDER — HYDROCHLOROTHIAZIDE 25 MG PO TABS: 25.0000 mg | ORAL_TABLET | Freq: Every day | ORAL | 2 refills | Status: DC

## 2019-11-21 MED ORDER — ATORVASTATIN CALCIUM 20 MG PO TABS: 20.0000 mg | ORAL_TABLET | Freq: Every day | ORAL | 2 refills | Status: DC

## 2019-11-21 MED ORDER — METOPROLOL SUCCINATE ER 25 MG PO TB24: 25.0000 mg | ORAL_TABLET | Freq: Every day | ORAL | 2 refills | Status: DC

## 2019-11-21 MED ORDER — LOSARTAN POTASSIUM 100 MG PO TABS: 100.0000 mg | ORAL_TABLET | Freq: Every day | ORAL | 2 refills | Status: DC

## 2019-11-21 NOTE — Progress Notes (Signed)
This visit occurred during the SARS-CoV-2 public health emergency.  Safety protocols were in place, including screening questions prior to the visit, additional usage of staff PPE, and extensive cleaning of exam room while observing appropriate contact time as indicated for disinfecting solutions.  Subjective:     Patient ID: Martha Pham , female    DOB: 01/28/1951 , 69 y.o.   MRN: [id number]   Chief Complaint  Patient presents with  . Hypertension  . Hyperlipidemia    HPI  She is here today for a f/u HTN, hyperlipidemia and diabetes. She reports compliance with meds. She admits she has not been exercising regularly. She has recently returned to [place] from [place] where she helping her grandson with virtual school. She has received her COVID vaccine.   Hypertension This is a chronic problem. The current episode started more than 1 year ago. The problem has been gradually improving since onset. The problem is controlled. Risk factors for coronary artery disease include diabetes mellitus, dyslipidemia, post-menopausal state, obesity and sedentary lifestyle. The current treatment provides moderate improvement. Compliance problems include exercise.   Diabetes She presents for her follow-up diabetic visit. She has type 2 diabetes mellitus. Her disease course has been improving. There are no hypoglycemic associated symptoms. There are no diabetic associated symptoms. There are no hypoglycemic complications.     Past Medical History:  Diagnosis Date  . Apnea, sleep    does wear cpap  . Arthritis   . Cancer (Martha Pham)    lt. breast ca-snbx  . Chronic kidney disease   . Contact lens/glasses fitting    wears contacts or glasses  . COPD (chronic obstructive pulmonary disease) (HCC)    brother  . Heart murmur   . Hypercholesteremia   . Hyperlipemia   . Hypertension   . Lower back pain   . Lung cancer ([place])   . Malaise and fatigue   . Personal history of chemotherapy 2013  . Sleep  apnea    SLEEP STUDY DX 10,CPAP BUT DON'TUSE  . Vitamin D deficiency   . Wears dentures    upper     Family History  Problem Relation Age of Onset  . Brain cancer Father   . Heart attack Mother   . Breast cancer Sister 54  . Breast cancer Sister 52     Current Outpatient Medications:  .  acetaminophen (TYLENOL) 500 MG tablet, Take 500 mg by mouth every 6 (six) hours as needed for moderate pain. , Disp: , Rfl:  .  atorvastatin (LIPITOR) 20 MG tablet, Take 1 tablet (20 mg total) by mouth daily., Disp: 90 tablet, Rfl: 2 .  calcium carbonate (OS-CAL) 600 MG TABS, Take 600 mg by mouth 2 (two) times daily with a meal.  , Disp: , Rfl:  .  Carboxymethylcellulose Sodium (REFRESH OP), Apply 1 drop to eye 2 (two) times daily as needed. For dry eyes, Disp: , Rfl:  .  fish oil-omega-3 fatty acids 1000 MG capsule, Take 2 g by mouth every evening. , Disp: , Rfl:  .  gabapentin (NEURONTIN) 100 MG capsule, Take 1 pill at bedtime as needed for leg pain., Disp: 90 capsule, Rfl: 3 .  hydrochlorothiazide (HYDRODIURIL) 25 MG tablet, Take 1 tablet (25 mg total) by mouth daily., Disp: 90 tablet, Rfl: 2 .  losartan (COZAAR) 100 MG tablet, Take 1 tablet (100 mg total) by mouth daily., Disp: 90 tablet, Rfl: 2 .  Magnesium 400 MG CAPS, Take by mouth daily., Disp: , Rfl:  .  metoprolol succinate (TOPROL-XL) 25 MG 24 hr tablet, Take 1 tablet (25 mg total) by mouth daily., Disp: 90 tablet, Rfl: 2 .  Vitamin D, Cholecalciferol, 50 MCG (2000 UT) CAPS, Take by mouth., Disp: , Rfl:    Allergies  Allergen Reactions  . Dust Mite Extract Itching  . Other     grass     Review of Systems  Constitutional: Negative.   Respiratory: Negative.   Cardiovascular: Negative.   Gastrointestinal: Negative.   Neurological: Negative.   Psychiatric/Behavioral: Negative.      Today's Vitals   11/21/19 1520  BP: 118/66  Pulse: 70  Temp: 98.1 F (36.7 C)  TempSrc: Oral  Weight: 166 lb 6.4 oz (75.5 kg)  Height: 4' 11"   (1.499 m)  PainSc: 0-No pain   BP Readings from Last 3 Encounters:  11/21/19 118/66  11/18/19 (!) 142/79  11/02/19 (!) 142/78    Body mass index is 33.61 kg/m.   Wt Readings from Last 3 Encounters:  11/21/19 166 lb 6.4 oz (75.5 kg)  11/18/19 164 lb 12.8 oz (74.8 kg)  11/02/19 168 lb (76.2 kg)     Objective:  Physical Exam Vitals and nursing note reviewed.  Constitutional:      Appearance: Normal appearance. She is obese.  HENT:     Head: Normocephalic and atraumatic.  Cardiovascular:     Rate and Rhythm: Normal rate and regular rhythm.     Heart sounds: Normal heart sounds.  Pulmonary:     Effort: Pulmonary effort is normal.     Breath sounds: Normal breath sounds.  Skin:    General: Skin is warm.  Neurological:     General: No focal deficit present.     Mental Status: She is alert.  Psychiatric:        Mood and Affect: Mood normal.        Behavior: Behavior normal.         Assessment And Plan:     1. Parenchymal renal hypertension, stage 1 through stage 4  Chronic, well controlled. Previous readings at specialist were elevated. She agrees to rto in 2 weeks for a nurse visit. She is also encouraged to purchase BP cuff to follow her BP readings at home.  She will continue with current meds for now. She is encouraged to avoid adding salt to her foods. I will check her renal function today.   - CMP14+EGFR  2. Pure hypercholesterolemia  Chronic, previous LDL reviewed. LDL is not at goal, 96. She will continue with current meds for now. Encouraged to exercise at least 30 minutes five days weekly and to avoid fried foods.   3. Diabetes mellitus with stage 2 chronic kidney disease ([place])  She is not taking any meds at this time, per her request. She is encouraged to avoid sugary beverages, processed foods and to increase her daily activity.    - Hemoglobin A1c  4. Class 1 obesity due to excess calories with serious comorbidity and body mass index (BMI) of 33.0 to  33.9 in adult  She is encouraged to incorporate more exercise into her daily routine. She is to aim for BMI less than 30 and 150 minutes of exercise per week. Will re-address at her next visit.    Martha Greenland, MD    THE PATIENT IS ENCOURAGED TO PRACTICE SOCIAL DISTANCING DUE TO THE COVID-19 PANDEMIC.

## 2019-11-21 NOTE — Patient Instructions (Signed)

## 2019-11-22 ENCOUNTER — Telehealth: Payer: Self-pay | Admitting: Adult Health

## 2019-11-22 LAB — CMP14+EGFR
ALT: 13 IU/L (ref 0–32)
AST: 14 IU/L (ref 0–40)
Albumin/Globulin Ratio: 1.7 (ref 1.2–2.2)
Albumin: 4.6 g/dL (ref 3.8–4.8)
Alkaline Phosphatase: 87 IU/L (ref 39–117)
BUN/Creatinine Ratio: 16 (ref 12–28)
BUN: 15 mg/dL (ref 8–27)
Bilirubin Total: 0.6 mg/dL (ref 0.0–1.2)
CO2: 24 mmol/L (ref 20–29)
Calcium: 9.8 mg/dL (ref 8.7–10.3)
Chloride: 104 mmol/L (ref 96–106)
Creatinine, Ser: 0.92 mg/dL (ref 0.57–1.00)
GFR calc Af Amer: 73 mL/min/{1.73_m2} (ref >59)
GFR calc non Af Amer: 63 mL/min/{1.73_m2} (ref >59)
Globulin, Total: 2.7 g/dL (ref 1.5–4.5)
Glucose: 106 mg/dL — ABNORMAL HIGH (ref 65–99)
Potassium: 3.8 mmol/L (ref 3.5–5.2)
Sodium: 142 mmol/L (ref 134–144)
Total Protein: 7.3 g/dL (ref 6.0–8.5)

## 2019-11-22 LAB — HEMOGLOBIN A1C
Est. average glucose Bld gHb Est-mCnc: 117 mg/dL
Hgb A1c MFr Bld: 5.7 % — ABNORMAL HIGH (ref 4.8–5.6)

## 2019-11-22 NOTE — Telephone Encounter (Signed)
Scheduled appts per 4/30 los. Left voicemail with next appt date and time.

## 2019-11-28 ENCOUNTER — Ambulatory Visit: Payer: PPO | Attending: Internal Medicine

## 2019-11-28 DIAGNOSIS — Z23 Encounter for immunization: Secondary | ICD-10-CM

## 2019-11-28 NOTE — Progress Notes (Signed)
   Covid-19 Vaccination Clinic  Name:  Martha Pham    MRN: [id number] DOB: January 05, 1951  11/28/2019  Martha Pham was observed post Covid-19 immunization for 15 minutes without incident. She was provided with Vaccine Information Sheet and instruction to access the V-Safe system.   Martha Pham was instructed to call 911 with any severe reactions post vaccine: Marland Kitchen Difficulty breathing  . Swelling of face and throat  . A fast heartbeat  . A bad rash all over body  . Dizziness and weakness   Immunizations Administered    Name Date Dose VIS Date Route   Pfizer COVID-19 Vaccine 11/28/2019  9:08 AM 0.3 mL 09/14/2018 Intramuscular   Manufacturer: [place]   Lot: [id number]   [place]: [address]-2

## 2019-11-30 ENCOUNTER — Ambulatory Visit (INDEPENDENT_AMBULATORY_CARE_PROVIDER_SITE_OTHER): Payer: PPO | Admitting: Neurology

## 2019-11-30 ENCOUNTER — Other Ambulatory Visit: Payer: Self-pay

## 2019-11-30 DIAGNOSIS — G4733 Obstructive sleep apnea (adult) (pediatric): Secondary | ICD-10-CM | POA: Diagnosis not present

## 2019-11-30 DIAGNOSIS — G4719 Other hypersomnia: Secondary | ICD-10-CM

## 2019-11-30 DIAGNOSIS — G4720 Circadian rhythm sleep disorder, unspecified type: Secondary | ICD-10-CM

## 2019-11-30 DIAGNOSIS — G2581 Restless legs syndrome: Secondary | ICD-10-CM

## 2019-11-30 DIAGNOSIS — R0683 Snoring: Secondary | ICD-10-CM

## 2019-11-30 DIAGNOSIS — E669 Obesity, unspecified: Secondary | ICD-10-CM

## 2019-12-01 ENCOUNTER — Other Ambulatory Visit (HOSPITAL_COMMUNITY): Payer: PPO

## 2019-12-02 ENCOUNTER — Ambulatory Visit (HOSPITAL_COMMUNITY)
Admission: RE | Admit: 2019-12-02 | Discharge: 2019-12-02 | Disposition: A | Discharge status: Home / Self Care | Payer: PPO | Source: Ambulatory Visit | Attending: Adult Health | Admitting: Adult Health

## 2019-12-02 ENCOUNTER — Other Ambulatory Visit: Payer: Self-pay

## 2019-12-02 DIAGNOSIS — C50212 Malignant neoplasm of upper-inner quadrant of left female breast: Secondary | ICD-10-CM | POA: Diagnosis not present

## 2019-12-02 DIAGNOSIS — Z853 Personal history of malignant neoplasm of breast: Secondary | ICD-10-CM | POA: Diagnosis not present

## 2019-12-02 DIAGNOSIS — Z170 Estrogen receptor positive status [ER+]: Secondary | ICD-10-CM | POA: Diagnosis not present

## 2019-12-02 DIAGNOSIS — K5730 Diverticulosis of large intestine without perforation or abscess without bleeding: Secondary | ICD-10-CM | POA: Diagnosis not present

## 2019-12-02 MED ORDER — IOHEXOL 300 MG/ML  SOLN
100.0000 mL | Freq: Once | INTRAMUSCULAR | Status: AC | PRN
  Administered 2019-12-02: 100 mL via INTRAVENOUS

## 2019-12-02 MED ORDER — SODIUM CHLORIDE (PF) 0.9 % IJ SOLN
INTRAMUSCULAR | Status: AC
  Filled 2019-12-02: qty 50

## 2019-12-05 ENCOUNTER — Ambulatory Visit: Payer: PPO

## 2019-12-05 ENCOUNTER — Telehealth: Payer: Self-pay | Admitting: *Deleted

## 2019-12-05 ENCOUNTER — Other Ambulatory Visit: Payer: Self-pay

## 2019-12-05 VITALS — BP 128/70 | HR 62 | Temp 97.6°F | Ht 59.4 in | Wt 166.4 lb

## 2019-12-05 DIAGNOSIS — I10 Essential (primary) hypertension: Secondary | ICD-10-CM

## 2019-12-05 NOTE — Telephone Encounter (Signed)
Per Wilber Bihari, NP, called to make pt aware that chest, abdomen, and pelvis CT scan was clear of cancer. Pt verbalized understanding and was appreciative

## 2019-12-05 NOTE — Progress Notes (Signed)
Patient presents today for a 2 week bp check. bp today was 128/70.

## 2019-12-12 ENCOUNTER — Ambulatory Visit: Payer: PPO | Admitting: Plastic Surgery

## 2019-12-12 ENCOUNTER — Encounter: Payer: Self-pay | Admitting: Plastic Surgery

## 2019-12-12 ENCOUNTER — Other Ambulatory Visit: Payer: Self-pay

## 2019-12-12 VITALS — BP 147/72 | HR 78 | Temp 97.5°F | Ht 59.25 in | Wt 170.0 lb

## 2019-12-12 DIAGNOSIS — L910 Hypertrophic scar: Secondary | ICD-10-CM

## 2019-12-12 NOTE — Progress Notes (Signed)
Referring Provider Glendale Chard, [place] [address] [place],  [place] [id number]   CC:  Chief Complaint  Patient presents with  . Advice Only    for Keloid scars and (R) breast      Martha Pham is an 68 y.o. female.  HPI: Patient presents to discuss the keloid scar in the right chest.  This was from a chemotherapy port that was removed many years ago.  She has a widening scar that is itchy and painful.  There is one specific part of the scar medially that is particularly bothersome for her.  She also has a pigmented lesion more centrally on the chest that she would like me to look at.  This has not been there previously but came up sometime over the last 6 months to a year.  He has not changed in size.  Allergies  Allergen Reactions  . Dust Mite Extract Itching  . Other     grass    Outpatient Encounter Medications as of 12/12/2019  Medication Sig  . acetaminophen (TYLENOL) 500 MG tablet Take 500 mg by mouth every 6 (six) hours as needed for moderate pain.   Marland Kitchen atorvastatin (LIPITOR) 20 MG tablet Take 1 tablet (20 mg total) by mouth daily.  . calcium carbonate (OS-CAL) 600 MG TABS Take 600 mg by mouth 2 (two) times daily with a meal.    . Carboxymethylcellulose Sodium (REFRESH OP) Apply 1 drop to eye 2 (two) times daily as needed. For dry eyes  . fish oil-omega-3 fatty acids 1000 MG capsule Take 2 g by mouth every evening.   . gabapentin (NEURONTIN) 100 MG capsule Take 1 pill at bedtime as needed for leg pain.  . hydrochlorothiazide (HYDRODIURIL) 25 MG tablet Take 1 tablet (25 mg total) by mouth daily.  Marland Kitchen losartan (COZAAR) 100 MG tablet Take 1 tablet (100 mg total) by mouth daily.  . Magnesium 400 MG CAPS Take by mouth daily.  . metoprolol succinate (TOPROL-XL) 25 MG 24 hr tablet Take 1 tablet (25 mg total) by mouth daily.  . Vitamin D, Cholecalciferol, 50 MCG (2000 UT) CAPS Take by mouth.   No facility-administered encounter medications on file as of 12/12/2019.       Past Medical History:  Diagnosis Date  . Apnea, sleep    does wear cpap  . Arthritis   . Cancer ([place])    lt. breast ca-snbx  . Chronic kidney disease   . Contact lens/glasses fitting    wears contacts or glasses  . COPD (chronic obstructive pulmonary disease) ([facility])    brother  . Heart murmur   . Hypercholesteremia   . Hyperlipemia   . Hypertension   . Lower back pain   . Lung cancer ([place])   . Malaise and fatigue   . Personal history of chemotherapy 2013  . Sleep apnea    SLEEP STUDY DX 10,CPAP BUT DON'TUSE  . Vitamin D deficiency   . Wears dentures    upper    Past Surgical History:  Procedure Laterality Date  . BREAST BIOPSY Left 05/26/2011   malignant  . BREAST BIOPSY Right 10/29/2016  . BREAST IMPLANT EXCHANGE Left 09/23/2012   Procedure: REMOVAL OF LEFT EXPANDER/PLACEMENT OF IMPLANT WITH REDUCTION OF RIGHT BREAST;  Surgeon: Theodoro Kos, DO;  Location: [facility];  Service: Plastics;  Laterality: Left;  . BREAST LUMPECTOMY W/ NEEDLE LOCALIZATION  06/24/2011   left  . BREAST REDUCTION SURGERY Right 09/23/2012   Procedure:  MAMMARY REDUCTION  (BREAST);  Surgeon: Theodoro Kos, DO;  Location: [facility];  Service: Plastics;  Laterality: Right;  . BREAST SURGERY  08/07/2011   re-exc. left breast margins  . CYST REMOVAL NECK  08/2013   Dr. Migdalia Dk  . LIPOSUCTION Bilateral 09/23/2012   Procedure: LIPOSUCTION;  Surgeon: Theodoro Kos, DO;  Location: [facility];  Service: Plastics;  Laterality: Bilateral;  . MASTECTOMY Left 2012  . MOUTH SURGERY     TOOTH IMPLANT BOTTOM X2 LFT  . PORT-A-CATH REMOVAL Right 09/23/2012   Procedure: REMOVAL PORT-A-CATH;  Surgeon: Theodoro Kos, DO;  Location: [facility];  Service: Plastics;  Laterality: Right;  . PORTACATH PLACEMENT  06/24/2011   Procedure: INSERTION PORT-A-CATH;  Surgeon: Judieth Keens, DO;  Location: [facility];  Service: General;  Laterality: Right;   Right mediport placement  . RECONSTRUCTION BREAST IMMEDIATE / DELAYED W/ TISSUE EXPANDER  10/13   left  . REDUCTION MAMMAPLASTY Right 2013  . SIMPLE MASTECTOMY  09/01/2011   left  . TONSILLECTOMY      Family History  Problem Relation Age of Onset  . Brain cancer Father   . Heart attack Mother   . Breast cancer Sister 45  . Breast cancer Sister 20    Social History   Social History Narrative   Daughter [place], [place]     Review of Systems General: Denies fevers, chills, weight loss CV: Denies chest pain, shortness of breath, palpitations  Physical Exam Vitals with BMI 12/12/2019 12/05/2019 11/21/2019  Height 4' 11.25" 4' 11.4" 4\' 11"   Weight 170 lbs 166 lbs 6 oz 166 lbs 6 oz  BMI 34.04 17.79 39.03  Systolic 009 233 007  Diastolic 72 70 66  Pulse 78 62 70    General:  No acute distress,  Alert and oriented, Non-Toxic, Normal speech and affect Chest: She has a keloid scar on her port site.  It is wider medially and this is the area that corresponds with her more severe symptoms.  More centrally on her chest she has a 2 to 3 mm diameter pigmented lesion.  Assessment/Plan Patient presents with ischemic keloid scar and a pigmented lesion.  We discussed steroid injection into the keloid and a punch biopsy of the suspicious pigmented lesion.  We discussed the risks include bleeding, infection, damage surrounding structures, need for additional procedures.  We discussed the potential for keloid at the new site but ultimately she wants to make sure that there is nothing malignant there given her history of breast cancer.  We will plan to do this when she returns from her trip to [place].  Cindra Presume 12/12/2019, 2:24 PM

## 2019-12-14 NOTE — Addendum Note (Signed)
Addended by: Star Age on: 12/14/2019 09:54 AM   Modules accepted: Orders

## 2019-12-14 NOTE — Procedures (Signed)
PATIENT'S NAME:  Martha Pham, Martha Pham DOB:      1951/05/02      MR#:    [id number]     DATE OF RECORDING: 11/30/2019 REFERRING M.D.:  Dr. Sheran Fava Performed:   Baseline Polysomnogram HISTORY: 69 year old woman with a history of hyperlipidemia, hypertension, vitamin D deficiency, chronic kidney disease, breast cancer in 2012 (s/p lumpectomies, L mastectomy, and chemo), low back pain and obesity, who presents for re-evaluation of her prior diagnosis of OSA. She had mild sleep apnea, diagnosed in 2011. She has restless leg syndrome and is on gabapentin prn. The patient endorsed the Epworth Sleepiness Scale at 16 points. The patient's weight 168 pounds with a height of 59 (inches), resulting in a BMI of 33.8 kg/m2.   CURRENT MEDICATIONS: Tylenol, Lipitor, Os-Cal, Refresh OP, fish oil, Neurontin, Hydrodiuril, Cozaar, Magnesium, Toprol-XL, Vitamin D   PROCEDURE:  This is a multichannel digital polysomnogram utilizing the Somnostar 11.2 system.  Electrodes and sensors were applied and monitored per AASM Specifications.   EEG, EOG, Chin and Limb EMG, were sampled at 200 Hz.  ECG, Snore and Nasal Pressure, Thermal Airflow, Respiratory Effort, CPAP Flow and Pressure, Oximetry was sampled at 50 Hz. Digital video and audio were recorded.      BASELINE STUDY  Lights Out was at 22:04 and Lights On at 04:54.  Total recording time (TRT) was 410.5 minutes, with a total sleep time (TST) of 361 minutes.   The patient's sleep latency was 20 minutes.  REM latency was 87.5 minutes, which is normal. The sleep efficiency was 87.9 %.     SLEEP ARCHITECTURE: WASO (Wake after sleep onset) was 36 minutes with mild to moderate sleep fragmentation noted. There were 15.5 minutes in Stage N1, 213.5 minutes Stage N2, 73.5 minutes Stage N3 and 58.5 minutes in Stage REM.  The percentage of Stage N1 was 4.3%, Stage N2 was 59.1%, which is mildly elevated, Stage N3 was 20.4%, which is normal, and Stage R (REM sleep) was 16.2%, which is mildly  reduced. The arousals were noted as: 36 were spontaneous, 0 were associated with PLMs, 31 were associated with respiratory events.  RESPIRATORY ANALYSIS:  There were a total of 56 respiratory events:  20 obstructive apneas, 0 central apneas and 2 mixed apneas with a total of 22 apneas and an apnea index (AI) of 3.7 /hour. There were 34 hypopneas with a hypopnea index of 5.7 /hour. The patient also had 0 respiratory event related arousals (RERAs).      The total APNEA/HYPOPNEA INDEX (AHI) was 9.3/hour and the total RESPIRATORY DISTURBANCE INDEX was  9.3 /hour.  33 events occurred in REM sleep and 36 events in NREM. The REM AHI was  33.8 /hour, versus a non-REM AHI of 4.6. The patient spent 351.5 minutes of total sleep time in the supine position and 10 minutes in non-supine.. The supine AHI was 9.6 versus a non-supine AHI of 0.0.  OXYGEN SATURATION & C02:  The Wake baseline 02 saturation was 97%, with the lowest being 77%. Time spent below 89% saturation equaled 6 minutes.  PERIODIC LIMB MOVEMENTS: The patient had a total of 0 Periodic Limb Movements.  The Periodic Limb Movement (PLM) index was 0 and the PLM Arousal index was 0/hour.  Audio and video analysis did not show any abnormal or unusual movements, behaviors, phonations or vocalizations. The patient took no bathroom breaks. Mild to loud snoring was noted. The EKG was in keeping with normal sinus rhythm (NSR).  Post-study, the patient indicated that  sleep was worse than usual.   IMPRESSION:  1. Obstructive Sleep Apnea (OSA) 2. Dysfunctions associated with sleep stages or arousal from sleep  RECOMMENDATIONS:  1. This study demonstrates overall mild obstructive sleep apnea, severe in REM sleep with a total AHI of 9.3/hour, REM AHI of 33.8/hour, and O2 nadir of 77% (during supine REM sleep). Treatment with positive airway pressure is recommended; this can be achieved in the form of autoPAP. Alternatively, a full-night CPAP titration study  would allow optimization of therapy if needed. Other treatment options may include avoidance of supine sleep position along with weight loss, upper airway or jaw surgery in selected patients or the use of an oral appliance in certain patients. ENT evaluation and/or consultation with a maxillofacial surgeon or dentist may be feasible in some instances.   2. Please note that untreated obstructive sleep apnea may carry additional perioperative morbidity. Patients with significant obstructive sleep apnea should receive perioperative PAP therapy and the surgeons and particularly the anesthesiologist should be informed of the diagnosis and the severity of the sleep disordered breathing. 3. This study shows sleep fragmentation and abnormal sleep stage percentages; these are nonspecific findings and per se do not signify an intrinsic sleep disorder or a cause for the patient's sleep-related symptoms. Causes include (but are not limited to) the first night effect of the sleep study, circadian rhythm disturbances, medication effect or an underlying mood disorder or medical problem.  4. The patient should be cautioned not to drive, work at heights, or operate dangerous or heavy equipment when tired or sleepy. Review and reiteration of good sleep hygiene measures should be pursued with any patient. 5. The patient will be seen in follow-up by Dr. Rexene Alberts at [facility] for discussion of the test results and further management strategies. The referring provider will be notified of the test results.  I certify that I have reviewed the entire raw data recording prior to the issuance of this report in accordance with the Standards of Accreditation of the American Academy of Sleep Medicine (AASM)  Star Age, MD, PhD Diplomat, American Board of Neurology and Sleep Medicine (Neurology and Sleep Medicine)

## 2019-12-14 NOTE — Progress Notes (Signed)
Patient was last seen for FU of her RLS on 11/02/19 and reported increase in EDS, her last PSG in 2016 was negative for OSA, but she had been diagnosed with mild OSA in 2011. She had a PSG on 11/30/19. Please call and notify the patient that the recent sleep study did confirm the diagnosis of obstructive sleep apnea. She had overall mild OSA, but severe during REM/dream sleep with low O2 of 77% during supine REM sleep. I recommend treatment in the form of autoPAP, which means, that we don't have to bring her back for a second sleep study with CPAP, but will let her try an autoPAP machine at home, through a DME company (of her choice, or as per insurance requirement). The DME representative will educate her on how to use the machine, how to put the mask on, etc. I have placed an order in the chart. Please send referral, talk to patient, send report to referring MD. We will need a FU in sleep clinic for 10 weeks post-PAP set up, please arrange that with me or one of our NPs. Thanks,   Star Age, MD, PhD [facility])

## 2019-12-20 ENCOUNTER — Telehealth: Payer: Self-pay

## 2019-12-20 NOTE — Telephone Encounter (Signed)
I called Martha Pham. I advised Martha Pham that Dr. Rexene Alberts reviewed their sleep study results and found that Martha Pham does have osa. Dr. Rexene Alberts recommends that Martha Pham start an autopap for treatment. I reviewed PAP compliance expectations with the Martha Pham. Martha Pham is agreeable to starting an auto-PAP. She did state she is currently out of town and will call back in about a month when she returns and then order for auto pap can be sent.  We will use [facility] once Martha Pham is ready to start on the autopap. I will wait to hear from the Martha Pham.

## 2019-12-20 NOTE — Telephone Encounter (Signed)
-----   Message from Star Age, MD sent at 12/14/2019  9:54 AM EDT ----- Patient was last seen for FU of her RLS on 11/02/19 and reported increase in EDS, her last PSG in 2016 was negative for OSA, but she had been diagnosed with mild OSA in 2011. She had a PSG on 11/30/19. Please call and notify the patient that the recent sleep study did confirm the diagnosis of obstructive sleep apnea. She had overall mild OSA, but severe during REM/dream sleep with low O2 of 77% during supine REM sleep. I recommend treatment in the form of autoPAP, which means, that we don't have to bring her back for a second sleep study with CPAP, but will let her try an autoPAP machine at home, through a DME company (of her choice, or as per insurance requirement). The DME representative will educate her on how to use the machine, how to put the mask on, etc. I have placed an order in the chart. Please send referral, talk to patient, send report to referring MD. We will need a FU in sleep clinic for 10 weeks post-PAP set up, please arrange that with me or one of our NPs. Thanks,   Star Age, MD, PhD [facility])

## 2019-12-22 ENCOUNTER — Institutional Professional Consult (permissible substitution): Payer: PPO | Admitting: Plastic Surgery

## 2020-01-05 NOTE — Telephone Encounter (Signed)
Order has been sent to [facility] to start process on starting auto pap.

## 2020-01-05 NOTE — Telephone Encounter (Signed)
Patient called to inform RN she is back in town and ready to have auto pap ordered. She did state that she will be going back out of town in 10 days. I advised will send message to [facility]. Patient verbalized understanding, appreciation.

## 2020-01-10 ENCOUNTER — Telehealth: Payer: Self-pay | Admitting: Internal Medicine

## 2020-01-10 NOTE — Chronic Care Management (AMB) (Signed)
  Chronic Care Management   Note  01/10/2020 Name: Martha Pham MRN: [id number] DOB: 08-22-1950  Martha Pham is a 69 y.o. year old female who is a primary care patient of Glendale Chard, MD. I reached out to Larae Grooms by phone today in response to a referral sent by [name]'s health plan.     [name] was given information about Chronic Care Management services today including:  1. CCM service includes personalized support from designated clinical staff supervised by her physician, including individualized plan of care and coordination with other care providers 2. 24/7 contact phone numbers for assistance for urgent and routine care needs. 3. Service will only be billed when office clinical staff spend 20 minutes or more in a month to coordinate care. 4. Only one practitioner may furnish and bill the service in a calendar month. 5. The patient may stop CCM services at any time (effective at the end of the month) by phone call to the office staff. 6. The patient will be responsible for cost sharing (co-pay) of up to 20% of the service fee (after annual deductible is met).  Patient agreed to services and verbal consent obtained.   Follow up plan: Telephone appointment with care management team member scheduled for: 02/07/2020  [facility]  [place], [place] [address] Direct Dial: [email or web address] Website: [email or web address]

## 2020-02-07 ENCOUNTER — Other Ambulatory Visit: Payer: Self-pay

## 2020-02-07 ENCOUNTER — Ambulatory Visit: Payer: PPO

## 2020-02-07 DIAGNOSIS — E1122 Type 2 diabetes mellitus with diabetic chronic kidney disease: Secondary | ICD-10-CM

## 2020-02-07 DIAGNOSIS — E7800 Pure hypercholesterolemia, unspecified: Secondary | ICD-10-CM

## 2020-02-07 DIAGNOSIS — N182 Chronic kidney disease, stage 2 (mild): Secondary | ICD-10-CM

## 2020-02-07 DIAGNOSIS — I10 Essential (primary) hypertension: Secondary | ICD-10-CM

## 2020-02-07 NOTE — Chronic Care Management (AMB) (Signed)
Chronic Care Management Pharmacy  Name: Martha Pham  MRN: [id number] DOB: [birth year]  Chief Complaint/ HPI  Martha Pham,  69 y.o. , female presents for their Initial CCM visit with the clinical pharmacist via telephone due to COVID-19 Pandemic. Patient mentions she is going to have cataract surgery once she goes back to the eye doctor. She is also scheduled for appointment to get autoPAP on 02/29/20.   PCP : Glendale Chard, MD  Their chronic conditions include: Diabetes mellitus, Stage 2 CKD, Hypertensive nephropathy  Office Visits: 12/05/19 Nurse visit: Presented for 2 week BP check. BP was 128/70.   11/21/19 OV: HTN/ HLD/ DM follow up. Pt admits not exercising regularly. Chronic, well controlled HTN, but BP was elevated at specialist visit. Return in 2 week for nurse visit. Encouraged to purchase home BP monitor. Labs ordered (CMP14+EGFR, HgbA1c). No meds currently for DM. Encouraged to exercise 150 minutes weekly. Continue current meds.   Consult Visits: 12/12/19 Plastic surgery OV w/ Dr. Claudia Desanctis: Presented for evaluation of keloid scar on right chest from where a chemotherapy port was removed years ago. New pigmented lesion also present on central chest that came up in last 6 months. Plan for steroid injection into keloid and punch biopsy of pigmented lesion when patient returns from [place]. Discussed potential complications.   12/05/19 Oncology telephone call: Pt notified that chest, abdomen, and pelvis CT scans were clear of cancer  11/30/19 Neurology sleep study: PSG confirmed mild OSA, but severe during REM/dream sleep with low O2 of 77%. Recommend treatment with autoPAP. AutoPAP to be supplied through [facility] who will also do training on use. F/U in 10 weeks post-PAP set up.   11/18/19 Oncology OV: Presented to survivorship clinic for follow up for history of breast cancer (Stage IIA left breast invasive ductal carcinoma, ER-/PR-/HER2- diagnosed in 05/2011). Complaint of right rib  pain and itching. Xray negative. Chest, abdomen, and pelvis CT ordered to evaluated right upper quadrant pain. Referred to plastic surgery for keloid formation as scars from surgery. Educated on bone health and cancer screenings.   11/02/19 Neurology OV w/ Dr. Rexene Alberts: Pt reported RLS under reasonable control with prn low-dose gabapentin at night. Increased snoring lately and daytime somnolence. Sleep study from 2016 did not show significant sleep apnea except for REM related sleep apnea. Sleep study ordered to re-evaluate sleep.  CCM Encounters:  Medications: Outpatient Encounter Medications as of 02/07/2020  Medication Sig  . acetaminophen (TYLENOL) 500 MG tablet Take 1,000 mg by mouth every 6 (six) hours as needed for moderate pain.   . Ascorbic Acid (VITAMIN C) 1000 MG tablet Take 1,000 mg by mouth daily.  Marland Kitchen atorvastatin (LIPITOR) 20 MG tablet Take 1 tablet (20 mg total) by mouth daily.  . Carboxymethylcellulose Sodium (REFRESH OP) Apply 1 drop to eye 2 (two) times daily as needed. For dry eyes  . Cyanocobalamin (VITAMIN B-12) 2500 MCG SUBL Place 1 tablet under the tongue daily.  . fish oil-omega-3 fatty acids 1000 MG capsule Take 2 g by mouth every evening.   . gabapentin (NEURONTIN) 100 MG capsule Take 1 pill at bedtime as needed for leg pain.  . hydrochlorothiazide (HYDRODIURIL) 25 MG tablet Take 1 tablet (25 mg total) by mouth daily.  Marland Kitchen losartan (COZAAR) 100 MG tablet Take 1 tablet (100 mg total) by mouth daily.  . metoprolol succinate (TOPROL-XL) 25 MG 24 hr tablet Take 1 tablet (25 mg total) by mouth daily.  . calcium carbonate (OS-CAL) 600 MG TABS  Take 600 mg by mouth 2 (two) times daily with a meal.   (Patient not taking: Reported on 02/07/2020)  . Magnesium 400 MG CAPS Take by mouth daily. (Patient not taking: Reported on 02/07/2020)  . Vitamin D, Cholecalciferol, 50 MCG (2000 UT) CAPS Take by mouth. (Patient not taking: Reported on 02/07/2020)   No facility-administered encounter  medications on file as of 02/07/2020.    Current Diagnosis/Assessment:  SDOH Interventions     Most Recent Value  SDOH Interventions  Financial Strain Interventions Intervention Not Indicated      Goals Addressed            This Visit's Progress   . Pharmacy Care Plan       CARE PLAN ENTRY (see longitudinal plan of care for additional care plan information)  Current Barriers:  . Chronic Disease Management support, education, and care coordination needs related to Hypertension, Hyperlipidemia, Diabetes, and Osteopenia/Osteoporosis Screening   Hypertension BP Readings from Last 3 Encounters:  12/12/19 (!) 147/72  12/05/19 128/70  11/21/19 118/66   . Pharmacist Clinical Goal(s): o Over the next 180 days, patient will work with PharmD and providers to maintain BP goal <140/90 . Current regimen:   Hydrochlorothiazide 87m daily  Losartan 1069mdaily  Metoprolol succinate 2533maily . Interventions: o Provided dietary and exercise recommendations . Patient self care activities - Over the next 180 days, patient will: o Check BP weekly and if symptomatic, document, and provide at future appointments o Ensure daily salt intake < 2300 mg/day o Exercise for 30 minutes daily 5 times per week (150 minutes total per week)  Hyperlipidemia Lab Results  Component Value Date/Time   LDLCALC 96 05/12/2019 10:04 AM   . Pharmacist Clinical Goal(s): o Over the next 90 days, patient will work with PharmD and providers to achieve LDL goal < 70 . Current regimen:  . Atorvastatin 91m52mily . Omega-3 fatty acids 2000mg86mapsules every evening . Interventions: o Provided dietary and exercise recommendations o Patient complained of size and smell of omega 3 capsules - Advised patient she could try odorless fish oil or try krill oil (smaller capsules) . Patient self care activities - Over the next 90 days, patient will: o Exercise for 30 minutes daily 5 times per week (150 minutes  total per week) o Try odorless fish oil or krill oil  Prediabetes Lab Results  Component Value Date/Time   HGBA1C 5.7 (H) 11/21/2019 04:10 PM   HGBA1C 5.6 05/12/2019 10:04 AM   . Pharmacist Clinical Goal(s): o Over the next 180 days, patient will work with PharmD and providers to maintain A1c goal <7% . Current regimen:  o N/A . Interventions: o Provided dietary and exercise recommendations o Recommend patient increase water intake to 64 ounces daily o Discussed importance of diet and exercise to lower A1c back to normal range . Patient self care activities - Over the next 180 days, patient will: o Check blood sugar occasionally, document, and provide at future appointments o Contact provider with any episodes of hypoglycemia o Exercise for 30 minutes daily 5 times per week (150 minutes total per week)  Osteopenia/Osteoporosis Screening . Pharmacist Clinical Goal(s) o Over the next 180 days, patient will work with PharmD and providers to screen patient for osteopenia/osteoporosis and supplement appropriately . Current regimen:  o N/A . Interventions: o Recommend DEXA scan (last order expired) o Recommend Vitamin D level o Recommend Vitamin D 2000 units daily and 1200mg 83mium daily o Recommend weight-bearing exercises  3 times weekly to build and maintain bone density . Patient self care activities - Over the next 180 days, patient will: o Have bone density scan performed o Start taking Vitamin D3 2000 units daily and calcium 1220m daily o Start weight-bearing exercises three times weekly  Medication management . Pharmacist Clinical Goal(s): o Over the next 180 days, patient will work with PharmD and providers to maintain optimal medication adherence . Current pharmacy: [facility] . Interventions o Comprehensive medication review performed. o Continue current medication management strategy . Patient self care activities - Over the next 180 days, patient will: o Focus on  medication adherence by continued use of pill box o Take medications as prescribed o Report any questions or concerns to PharmD and/or provider(s)  Initial goal documentation        Pre-diabetes   Recent Relevant Labs: Lab Results  Component Value Date/Time   HGBA1C 5.7 (H) 11/21/2019 04:10 PM   HGBA1C 5.6 05/12/2019 10:04 AM   EGFR 86 (L) 04/03/2015 11:09 AM    Checking BG: Rarely  Recent FBG Readings: Recent pre-meal BG readings:  Recent 2hr PP BG readings:   Recent HS BG readings:  Patient has failed these meds in past: N/A Patient is currently controlled on the following medications: N/A  Last diabetic Foot exam: Not on file Last diabetic Eye exam:  Lab Results  Component Value Date/Time   HMDIABEYEEXA No Retinopathy 05/20/2018 12:00 AM    We discussed:  . Diet extensively o Trying to cut back on sweets and starches o Tries to get 3 servings of vegetables a day and 2 servings of fruit o Loves to eat salads o 2 servings of meat a day o Rarely drinks soda and wine o Uses Stevia o Not drinking enough water daily (32 ounces) - Recommend 64 ounces of water daily o PLATE method . Exercise extensively o Before pandemic was going to the [employer] 4 days per week and do water aerobics o Doing line dancing at [employer](twice per week) o Walking some o Intends to get back into working out [place] o Goal is to exercise 30 minutes daily 5 times weekly - This amount of exercise can lower A1c up to 0.7% . Importance of diet and exercise to lower HgbA1c back to regular range  Plan Continue control with diet and exercise    Hypertension   Office blood pressures are  BP Readings from Last 3 Encounters:  12/12/19 (!) 147/72  12/05/19 128/70  11/21/19 118/66   Patient has failed these meds in the past: Telmisartan/HCTZ Patient is currently controlled on the following medications:   Hydrochlorothiazide 248m daily  Losartan 10031maily  Metoprolol succinate 74m95mily  Patient checks BP at home several times per month  Patient home BP readings are ranging: None to provide today  We discussed: . Diet and exercise extensively . Goal BP <140/90 . Has had an episode of lightheadedness/dizziness in the past week, but not typical   Plan Continue current medications    Hyperlipidemia   LDL goal < 70  Lipid Panel     Component Value Date/Time   CHOL 175 05/12/2019 1004   TRIG 57 05/12/2019 1004   HDL 68 05/12/2019 1004   LDLCALC 96 05/12/2019 1004    Hepatic Function Latest Ref Rng & Units 11/21/2019 05/12/2019 01/20/2019  Total Protein 6.0 - 8.5 g/dL 7.3 7.5 7.0  Albumin 3.8 - 4.8 g/dL  4.6 4.4 4.4  AST 0 - 40 IU/L _0 ALT 0 - 32 IU/L _1 Alk Phosphatase 39 - 117 IU/L 87 91 79  Total Bilirubin 0.0 - 1.2 mg/dL 0.6 0.5 0.5    The 10-year ASCVD risk score Mikey Bussing DC Jr., et al., 2013) is: 31.7%   Values used to calculate the score:     Age: 89 years     Sex: Female     Is Non-Hispanic African American: Yes     Diabetic: Yes     Tobacco smoker: No     Systolic Blood Pressure: 121 mmHg     Is BP treated: Yes     HDL Cholesterol: 68 mg/dL     Total Cholesterol: 175 mg/dL   Patient has failed these meds in past: N/A Patient is currently uncontrolled on the following medications:  . Atorvastatin 57m daily . Omega-3 fatty acids 20073m2 capsules every evening  We discussed:   . Diet and exercise extensively o Only eats red meat every once in a while  Pt says omega capsules are large and don't smell good  Advised her she could try an odorless kind or try krill oil  Plan Continue current medications  Restless Leg Syndrome   Patient has failed these meds in past: N/A Patient is currently controlled on the following medications:   Gabapentin 10037mt bedtime as needed  Plan Continue current medications  Osteopenia / Osteoporosis   Last DEXA Scan: Order  expired 11/28/19  Vit D, 25-Hydroxy  Date Value Ref Range Status  09/12/2013 56 30 - 89 ng/mL Final    Comment:    This assay accurately quantifies Vitamin D, which is the sum of the25-Hydroxy forms of Vitamin D2 and D3.  Studies have shown that theoptimum concentration of 25-Hydroxy Vitamin D is 30 ng/mL or higher. Concentrations of Vitamin D between 20 and 29 ng/mL  are considered tobe insufficient and concentrations less than 20 ng/mL are consideredto be deficient for Vitamin D.    Patient has failed these meds in past: N/A Patient is currently uncontrolled on the following medications:  . Not taking any supplements at this; has been on calcium and Vitamin D in the past   We discussed:  Recommend 1200 mg of calcium daily from dietary and supplemental sources. Recommend weight-bearing and muscle strengthening exercises for building and maintaining bone density.   Pt says she has not had a DEXA scan in past 2 years   Recommend Cholecalciferol 2000 units daily  Plan Continue control with diet and exercise  Recommend Vitamin D level at next office visit Recommend DEXA scan  Health Maintenance   Patient is currently on the following medications:  . VMarland Kitchentamin B12 2500m24maily . Vitamin C 1000mg78mly  We discussed:    Pt happy with current over the counter medications  Plan Continue current medications  Vaccines   Reviewed and discussed patient's vaccination history.    Immunization History  Administered Date(s) Administered  . Influenza, High Dose Seasonal PF 05/05/2018, 04/14/2019  . Influenza,inj,Quad PF,6+ Mos 04/04/2014  . PFIZER SARS-COV-2 Vaccination 11/03/2019, 11/28/2019  . Pneumococcal Polysaccharide-23 05/12/2019   Plan Discuss at follow up visit  Medication Management   [facility] Uses pill box? Yes Pt endorses 100% compliance  We discussed:   Importance of medication adherence  Plan Continue current medication  management strategy  Will discuss medication synchronization and adherence packaging at follow up visit  Follow up: 3 month phone visit  Jannette Fogo, PharmD Clinical Pharmacist [facility] [phone]

## 2020-02-23 ENCOUNTER — Ambulatory Visit: Payer: PPO | Admitting: Plastic Surgery

## 2020-02-29 DIAGNOSIS — G4733 Obstructive sleep apnea (adult) (pediatric): Secondary | ICD-10-CM | POA: Diagnosis not present

## 2020-03-06 NOTE — Telephone Encounter (Signed)
I called the pt and we scheduled her initial autopap f/u for 05/08/2020 at 1 pm. Pt was set up on 02/29/2020. I reviewed pap guidelines with the pt and advised her to use her machine at least 4 hours every night to be considered complaint with her insurance.

## 2020-03-08 NOTE — Patient Instructions (Addendum)
Visit Information  Goals Addressed            This Visit's Progress   . Pharmacy Care Plan       CARE PLAN ENTRY (see longitudinal plan of care for additional care plan information)  Current Barriers:  . Chronic Disease Management support, education, and care coordination needs related to Hypertension, Hyperlipidemia, Diabetes, and Osteopenia/Osteoporosis Screening   Hypertension BP Readings from Last 3 Encounters:  12/12/19 (!) 147/72  12/05/19 128/70  11/21/19 118/66   . Pharmacist Clinical Goal(s): o Over the next 180 days, patient will work with PharmD and providers to maintain BP goal <140/90 . Current regimen:   Hydrochlorothiazide 25mg  daily  Losartan 100mg  daily  Metoprolol succinate 25mg  daily . Interventions: o Provided dietary and exercise recommendations . Patient self care activities - Over the next 180 days, patient will: o Check BP weekly and if symptomatic, document, and provide at future appointments o Ensure daily salt intake < 2300 mg/day o Exercise for 30 minutes daily 5 times per week (150 minutes total per week)  Hyperlipidemia Lab Results  Component Value Date/Time   LDLCALC 96 05/12/2019 10:04 AM   . Pharmacist Clinical Goal(s): o Over the next 90 days, patient will work with PharmD and providers to achieve LDL goal < 70 . Current regimen:  . Atorvastatin 20mg  daily . Omega-3 fatty acids 2000mg  2 capsules every evening . Interventions: o Provided dietary and exercise recommendations o Patient complained of size and smell of omega 3 capsules - Advised patient she could try odorless fish oil or try krill oil (smaller capsules) . Patient self care activities - Over the next 90 days, patient will: o Exercise for 30 minutes daily 5 times per week (150 minutes total per week) o Try odorless fish oil or krill oil  Prediabetes Lab Results  Component Value Date/Time   HGBA1C 5.7 (H) 11/21/2019 04:10 PM   HGBA1C 5.6 05/12/2019 10:04 AM    . Pharmacist Clinical Goal(s): o Over the next 180 days, patient will work with PharmD and providers to maintain A1c goal <7% . Current regimen:  o N/A . Interventions: o Provided dietary and exercise recommendations o Recommend patient increase water intake to 64 ounces daily o Discussed importance of diet and exercise to lower A1c back to normal range . Patient self care activities - Over the next 180 days, patient will: o Check blood sugar occasionally, document, and provide at future appointments o Contact provider with any episodes of hypoglycemia o Exercise for 30 minutes daily 5 times per week (150 minutes total per week)  Osteopenia/Osteoporosis Screening . Pharmacist Clinical Goal(s) o Over the next 180 days, patient will work with PharmD and providers to screen patient for osteopenia/osteoporosis and supplement appropriately . Current regimen:  o N/A . Interventions: o Recommend DEXA scan (last order expired) o Recommend Vitamin D level o Recommend Vitamin D 2000 units daily and 1200mg  calcium daily o Recommend weight-bearing exercises 3 times weekly to build and maintain bone density . Patient self care activities - Over the next 180 days, patient will: o Have bone density scan performed o Start taking Vitamin D3 2000 units daily and calcium 1200mg  daily o Start weight-bearing exercises three times weekly  Medication management . Pharmacist Clinical Goal(s): o Over the next 180 days, patient will work with PharmD and providers to maintain optimal medication adherence . Current pharmacy: [facility] . Interventions o Comprehensive medication review performed. o Continue current medication management strategy . Patient self care activities -  Over the next 180 days, patient will: o Focus on medication adherence by continued use of pill box o Take medications as prescribed o Report any questions or concerns to PharmD and/or provider(s)  Initial goal documentation         Martha Pham was given information about Chronic Care Management services today including:  1. CCM service includes personalized support from designated clinical staff supervised by her physician, including individualized plan of care and coordination with other care providers 2. 24/7 contact phone numbers for assistance for urgent and routine care needs. 3. Standard insurance, coinsurance, copays and deductibles apply for chronic care management only during months in which we provide at least 20 minutes of these services. Most insurances cover these services at 100%, however patients may be responsible for any copay, coinsurance and/or deductible if applicable. This service may help you avoid the need for more expensive face-to-face services. 4. Only one practitioner may furnish and bill the service in a calendar month. 5. The patient may stop CCM services at any time (effective at the end of the month) by phone call to the office staff.  Patient agreed to services and verbal consent obtained.   The patient verbalized understanding of instructions provided today and agreed to receive a mailed copy of patient instruction and/or educational materials. Telephone follow up appointment with pharmacy team member scheduled for: 05/07/20 @ 4:15 PM  Jannette Fogo, PharmD Clinical Pharmacist [facility] [phone]   Diabetes Mellitus and Nutrition, Adult When you have diabetes (diabetes mellitus), it is very important to have healthy eating habits because your blood sugar (glucose) levels are greatly affected by what you eat and drink. Eating healthy foods in the appropriate amounts, at about the same times every day, can help you:  Control your blood glucose.  Lower your risk of heart disease.  Improve your blood pressure.  Reach or maintain a healthy weight. Every person with diabetes is different, and each person has different needs for a meal plan. Your health  care provider may recommend that you work with a diet and nutrition specialist (dietitian) to make a meal plan that is best for you. Your meal plan may vary depending on factors such as:  The calories you need.  The medicines you take.  Your weight.  Your blood glucose, blood pressure, and cholesterol levels.  Your activity level.  Other health conditions you have, such as heart or kidney disease. How do carbohydrates affect me? Carbohydrates, also called carbs, affect your blood glucose level more than any other type of food. Eating carbs naturally raises the amount of glucose in your blood. Carb counting is a method for keeping track of how many carbs you eat. Counting carbs is important to keep your blood glucose at a healthy level, especially if you use insulin or take certain oral diabetes medicines. It is important to know how many carbs you can safely have in each meal. This is different for every person. Your dietitian can help you calculate how many carbs you should have at each meal and for each snack. Foods that contain carbs include:  Bread, cereal, rice, pasta, and crackers.  Potatoes and corn.  Peas, beans, and lentils.  Milk and yogurt.  Fruit and juice.  Desserts, such as cakes, cookies, ice cream, and candy. How does alcohol affect me? Alcohol can cause a sudden decrease in blood glucose (hypoglycemia), especially if you use insulin or take certain oral diabetes medicines. Hypoglycemia can be a life-threatening condition.  Symptoms of hypoglycemia (sleepiness, dizziness, and confusion) are similar to symptoms of having too much alcohol. If your health care provider says that alcohol is safe for you, follow these guidelines:  Limit alcohol intake to no more than 1 drink per day for nonpregnant women and 2 drinks per day for men. One drink equals 12 oz of beer, 5 oz of wine, or 1 oz of hard liquor.  Do not drink on an empty stomach.  Keep yourself hydrated with  water, diet soda, or unsweetened iced tea.  Keep in mind that regular soda, juice, and other mixers may contain a lot of sugar and must be counted as carbs. What are tips for following this plan?  Reading food labels  Start by checking the serving size on the "Nutrition Facts" label of packaged foods and drinks. The amount of calories, carbs, fats, and other nutrients listed on the label is based on one serving of the item. Many items contain more than one serving per package.  Check the total grams (g) of carbs in one serving. You can calculate the number of servings of carbs in one serving by dividing the total carbs by 15. For example, if a food has 30 g of total carbs, it would be equal to 2 servings of carbs.  Check the number of grams (g) of saturated and trans fats in one serving. Choose foods that have low or no amount of these fats.  Check the number of milligrams (mg) of salt (sodium) in one serving. Most people should limit total sodium intake to less than 2,300 mg per day.  Always check the nutrition information of foods labeled as "low-fat" or "nonfat". These foods may be higher in added sugar or refined carbs and should be avoided.  Talk to your dietitian to identify your daily goals for nutrients listed on the label. Shopping  Avoid buying canned, premade, or processed foods. These foods tend to be high in fat, sodium, and added sugar.  Shop around the outside edge of the grocery store. This includes fresh fruits and vegetables, bulk grains, fresh meats, and fresh dairy. Cooking  Use low-heat cooking methods, such as baking, instead of high-heat cooking methods like deep frying.  Cook using healthy oils, such as olive, canola, or sunflower oil.  Avoid cooking with butter, cream, or high-fat meats. Meal planning  Eat meals and snacks regularly, preferably at the same times every day. Avoid going long periods of time without eating.  Eat foods high in fiber, such as  fresh fruits, vegetables, beans, and whole grains. Talk to your dietitian about how many servings of carbs you can eat at each meal.  Eat 4-6 ounces (oz) of lean protein each day, such as lean meat, chicken, fish, eggs, or tofu. One oz of lean protein is equal to: ? 1 oz of meat, chicken, or fish. ? 1 egg. ?  cup of tofu.  Eat some foods each day that contain healthy fats, such as avocado, nuts, seeds, and fish. Lifestyle  Check your blood glucose regularly.  Exercise regularly as told by your health care provider. This may include: ? 150 minutes of moderate-intensity or vigorous-intensity exercise each week. This could be brisk walking, biking, or water aerobics. ? Stretching and doing strength exercises, such as yoga or weightlifting, at least 2 times a week.  Take medicines as told by your health care provider.  Do not use any products that contain nicotine or tobacco, such as cigarettes and e-cigarettes. If you  need help quitting, ask your health care provider.  Work with a Social worker or diabetes educator to identify strategies to manage stress and any emotional and social challenges. Questions to ask a health care provider  Do I need to meet with a diabetes educator?  Do I need to meet with a dietitian?  What number can I call if I have questions?  When are the best times to check my blood glucose? Where to find more information:  American Diabetes Association: diabetes.org  Academy of Nutrition and Dietetics: www.eatright.[employer] of Diabetes and Digestive and Kidney Diseases (NIH): [employer] Summary  A healthy meal plan will help you control your blood glucose and maintain a healthy lifestyle.  Working with a diet and nutrition specialist (dietitian) can help you make a meal plan that is best for you.  Keep in mind that carbohydrates (carbs) and alcohol have immediate effects on your blood glucose levels. It is important to count carbs and to  use alcohol carefully. This information is not intended to replace advice given to you by your health care provider. Make sure you discuss any questions you have with your health care provider. Document Revised: 06/19/2017 Document Reviewed: 08/11/2016 Elsevier Patient Education  2020 [employer].

## 2020-03-30 DIAGNOSIS — Z20828 Contact with and (suspected) exposure to other viral communicable diseases: Secondary | ICD-10-CM | POA: Diagnosis not present

## 2020-03-31 DIAGNOSIS — G4733 Obstructive sleep apnea (adult) (pediatric): Secondary | ICD-10-CM | POA: Diagnosis not present

## 2020-04-09 ENCOUNTER — Telehealth: Payer: Self-pay

## 2020-04-09 NOTE — Chronic Care Management (AMB) (Signed)
Chronic Care Management Pharmacy Assistant   Name: Martha Pham  MRN: [id number] DOB: [id number]  Reason for Encounter: Disease State - Diabetes , Hypertension and Lipids  Patient Questions:  1.  Have you seen any other providers since your last visit? No  2.  Any changes in your medicines or health? No    PCP : Glendale Chard, MD  Allergies:   Allergies  Allergen Reactions  . Dust Mite Extract Itching  . Other     grass    Medications: Outpatient Encounter Medications as of 04/09/2020  Medication Sig  . acetaminophen (TYLENOL) 500 MG tablet Take 1,000 mg by mouth every 6 (six) hours as needed for moderate pain.   . Ascorbic Acid (VITAMIN C) 1000 MG tablet Take 1,000 mg by mouth daily.  Marland Kitchen atorvastatin (LIPITOR) 20 MG tablet Take 1 tablet (20 mg total) by mouth daily.  . calcium carbonate (OS-CAL) 600 MG TABS Take 600 mg by mouth 2 (two) times daily with a meal.   (Patient not taking: Reported on 02/07/2020)  . Carboxymethylcellulose Sodium (REFRESH OP) Apply 1 drop to eye 2 (two) times daily as needed. For dry eyes  . Cyanocobalamin (VITAMIN B-12) 2500 MCG SUBL Place 1 tablet under the tongue daily.  . fish oil-omega-3 fatty acids 1000 MG capsule Take 2 g by mouth every evening.   . gabapentin (NEURONTIN) 100 MG capsule Take 1 pill at bedtime as needed for leg pain.  . hydrochlorothiazide (HYDRODIURIL) 25 MG tablet Take 1 tablet (25 mg total) by mouth daily.  Marland Kitchen losartan (COZAAR) 100 MG tablet Take 1 tablet (100 mg total) by mouth daily.  . Magnesium 400 MG CAPS Take by mouth daily. (Patient not taking: Reported on 02/07/2020)  . metoprolol succinate (TOPROL-XL) 25 MG 24 hr tablet Take 1 tablet (25 mg total) by mouth daily.  . Vitamin D, Cholecalciferol, 50 MCG (2000 UT) CAPS Take by mouth. (Patient not taking: Reported on 02/07/2020)   No facility-administered encounter medications on file as of 04/09/2020.    Current Diagnosis: Patient Active Problem List   Diagnosis  Date Noted  . Diabetes mellitus with stage 2 chronic kidney disease ([place]) 06/22/2018  . Chronic renal disease, stage II 06/22/2018  . Hypertensive nephropathy 06/22/2018  . Adult BMI 29.0-29.9 kg/sq m 06/22/2018  . Pain in joint, pelvic region and thigh 03/28/2014  . Acquired absence of breast and absent nipple 05/05/2012  . Breast cancer of upper-inner quadrant of left female breast ([place]) 06/12/2011   Recent Relevant Labs: Lab Results  Component Value Date/Time   HGBA1C 5.7 (H) 11/21/2019 04:10 PM   HGBA1C 5.6 05/12/2019 10:04 AM    Kidney Function Lab Results  Component Value Date/Time   CREATININE 0.92 11/21/2019 04:10 PM   CREATININE 0.96 05/12/2019 10:04 AM   CREATININE 0.8 04/03/2015 11:09 AM   CREATININE 0.9 03/28/2014 11:21 AM   GFRNONAA 63 11/21/2019 04:10 PM   GFRAA 73 11/21/2019 04:10 PM    . Current antihyperglycemic regimen:  o None . What recent interventions/DTPs have been made to improve glycemic control:  o Patient has been watching what she eats and has been exercising two times a week. . Have there been any recent hospitalizations or ED visits since last visit with CPP? No   . Patient denies hypoglycemic symptoms. . Patient denies hyperglycemic symptoms.  . How often are you checking your blood sugar? Patient has not been checking her glucose levels at home. . What are your blood sugars  ranging?  o Fasting: none o Before meals: none o After meals:none o Bedtime: none . During the week, how often does your blood glucose drop below 70?  patient has not taking been checking her Glucose Levels.    Adherence Review: Is the patient currently on a STATIN medication? Yes . Atorvastatin 20 one a day / Omega 3 Fatty Acids 2000 mg tablet every evening. Is the patient currently on ACE/ARB medication? Yes Losartan 100 mg one tablet a day. Does the patient have >5 day gap between last estimated fill dates? No   Reviewed chart prior to disease state call. Spoke  with patient regarding BP  Recent Office Vitals: BP Readings from Last 3 Encounters:  12/12/19 (!) 147/72  12/05/19 128/70  11/21/19 118/66   Pulse Readings from Last 3 Encounters:  12/12/19 78  12/05/19 62  11/21/19 70    Wt Readings from Last 3 Encounters:  12/12/19 170 lb (77.1 kg)  12/05/19 166 lb 6.4 oz (75.5 kg)  11/21/19 166 lb 6.4 oz (75.5 kg)     Kidney Function Lab Results  Component Value Date/Time   CREATININE 0.92 11/21/2019 04:10 PM   CREATININE 0.96 05/12/2019 10:04 AM   CREATININE 0.8 04/03/2015 11:09 AM   CREATININE 0.9 03/28/2014 11:21 AM   GFRNONAA 63 11/21/2019 04:10 PM   GFRAA 73 11/21/2019 04:10 PM    BMP Latest Ref Rng & Units 11/21/2019 05/12/2019 01/20/2019  Glucose 65 - 99 mg/dL 106(H) 114(H) 114(H)  BUN 8 - 27 mg/dL 15 19 25   Creatinine 0.57 - 1.00 mg/dL 0.92 0.96 0.99  BUN/Creat Ratio 12 - 28 16 20 25   Sodium 134 - 144 mmol/L 142 138 143  Potassium 3.5 - 5.2 mmol/L 3.8 3.8 3.4(L)  Chloride 96 - 106 mmol/L 104 103 103  CO2 20 - 29 mmol/L 24 20 24   Calcium 8.7 - 10.3 mg/dL 9.8 10.0 9.4    . Current antihypertensive regimen:  o Losartan 100 mg one tablet a day. o Metoprolol Succinate 25 mg one tablet a day.  . How often are you checking your Blood Pressure? Patient has not been checking blood pressures at home. . Current home BP readings: none  . What recent interventions/DTPs have been made by any provider to improve Blood Pressure control since last CPP Visit: Patient has been taking her medications as directed and eating healthier and exercising 2 times a week.  . Any recent hospitalizations or ED visits since last visit with CPP? No   . What diet changes have been made to improve Blood Pressure Control?  o Patient has been eating a healthier diet , chicken , red meat only one day a week. Patient also eating fruits that have less sugars. . What exercise is being done to improve your Blood Pressure Control?  o Patient is going to [facility]  twice a week.  Adherence Review: Is the patient currently on ACE/ARB medication? Yes Does the patient have >5 day gap between last estimated fill dates? No    Comprehensive medication review performed; Spoke to patient regarding cholesterol  Lipid Panel    Component Value Date/Time   CHOL 175 05/12/2019 1004   TRIG 57 05/12/2019 1004   HDL 68 05/12/2019 1004   LDLCALC 96 05/12/2019 1004    10-year ASCVD risk score: The 10-year ASCVD risk score Mikey Bussing DC Jr., et al., 2013) is: 31.7%   Values used to calculate the score:     Age: 15 years     Sex:  Female     Is Non-Hispanic African American: Yes     Diabetic: Yes     Tobacco smoker: No     Systolic Blood Pressure: 101 mmHg     Is BP treated: Yes     HDL Cholesterol: 68 mg/dL     Total Cholesterol: 175 mg/dL  . Current antihyperlipidemic regimen:                 Atorvastatin 20 one a day / Omega 3 . Previous antihyperlipidemic medications tried:None . ASCVD risk enhancing conditions: age >12, DM, HTN, CKD, CHF, current smoker  . What recent interventions/DTPs have been made by any provider to improve Cholesterol control since last CPP Visit: Patient states she is eating red meat once a week, and exercising. . Any recent hospitalizations or ED visits since last visit with CPP? No . What diet changes have been made to improve Cholesterol?  o Patient has been eating red meat only once a week and eating more chicken. Patient is taking her Atorvastatin and her Omega 3 tablets.  . What exercise is being done to improve Cholesterol?  o Patient is going to [facility] twice a week.  Adherence Review: Does the patient have >5 day gap between last estimated fill dates? No        Goals Addressed            This Visit's Progress   . Pharmacy Care Plan   Not on track    [place] (see longitudinal plan of care for additional care plan information)  Current Barriers:  . Chronic Disease Management support, education, and  care coordination needs related to Hypertension, Hyperlipidemia, Diabetes, and Osteopenia/Osteoporosis Screening   Hypertension BP Readings from Last 3 Encounters:  12/12/19 (!) 147/72  12/05/19 128/70  11/21/19 118/66   . Pharmacist Clinical Goal(s): o Over the next 180 days, patient will work with PharmD and providers to maintain BP goal <140/90 . Current regimen:   Hydrochlorothiazide 25mg  daily  Losartan 100mg  daily  Metoprolol succinate 25mg  daily . Interventions: o Provided dietary and exercise recommendations . Patient self care activities - Over the next 180 days, patient will: o Check BP weekly and if symptomatic, document, and provide at future appointments o Ensure daily salt intake < 2300 mg/day o Exercise for 30 minutes daily 5 times per week (150 minutes total per week)  Hyperlipidemia Lab Results  Component Value Date/Time   LDLCALC 96 05/12/2019 10:04 AM   . Pharmacist Clinical Goal(s): o Over the next 90 days, patient will work with PharmD and providers to achieve LDL goal < 70 . Current regimen:  . Atorvastatin 20mg  daily . Omega-3 fatty acids 2000mg  2 capsules every evening . Interventions: o Provided dietary and exercise recommendations o Patient complained of size and smell of omega 3 capsules - Advised patient she could try odorless fish oil or try krill oil (smaller capsules) . Patient self care activities - Over the next 90 days, patient will: o Exercise for 30 minutes daily 5 times per week (150 minutes total per week) o Try odorless fish oil or krill oil  Prediabetes Lab Results  Component Value Date/Time   HGBA1C 5.7 (H) 11/21/2019 04:10 PM   HGBA1C 5.6 05/12/2019 10:04 AM   . Pharmacist Clinical Goal(s): o Over the next 180 days, patient will work with PharmD and providers to maintain A1c goal <7% . Current regimen:  o N/A . Interventions: o Provided dietary and exercise recommendations o Recommend patient  increase water intake to 64  ounces daily o Discussed importance of diet and exercise to lower A1c back to normal range . Patient self care activities - Over the next 180 days, patient will: o Check blood sugar occasionally, document, and provide at future appointments o Contact provider with any episodes of hypoglycemia o Exercise for 30 minutes daily 5 times per week (150 minutes total per week)  Osteopenia/Osteoporosis Screening . Pharmacist Clinical Goal(s) o Over the next 180 days, patient will work with PharmD and providers to screen patient for osteopenia/osteoporosis and supplement appropriately . Current regimen:  o N/A . Interventions: o Recommend DEXA scan (last order expired) o Recommend Vitamin D level o Recommend Vitamin D 2000 units daily and 1200mg  calcium daily o Recommend weight-bearing exercises 3 times weekly to build and maintain bone density . Patient self care activities - Over the next 180 days, patient will: o Have bone density scan performed o Start taking Vitamin D3 2000 units daily and calcium 1200mg  daily o Start weight-bearing exercises three times weekly  Medication management . Pharmacist Clinical Goal(s): o Over the next 180 days, patient will work with PharmD and providers to maintain optimal medication adherence . Current pharmacy: [facility] . Interventions o Comprehensive medication review performed. o Continue current medication management strategy . Patient self care activities - Over the next 180 days, patient will: o Focus on medication adherence by continued use of pill box o Take medications as prescribed o Report any questions or concerns to PharmD and/or provider(s)  Initial goal documentation        Follow-Up:  Pharmacist Review / Patient has not been checking her glucose levels or her blood pressure at home . Patient did discuss her fish oil tablets, and how bad the taste, told her that I had discuss with Jannette Fogo, CPP and that it would be okay to freeze  tablets, would help with the fishy taste/ burp . Patient has agreed to start taking her blood pressures and glucose levels at home , told her that I would call next month to check on the status.   Jannette Fogo, CPP notified.  Judithann Sheen, [facility] Clinical Pharmacist Assistant [phone]

## 2020-04-18 DIAGNOSIS — G4733 Obstructive sleep apnea (adult) (pediatric): Secondary | ICD-10-CM | POA: Diagnosis not present

## 2020-04-30 DIAGNOSIS — G4733 Obstructive sleep apnea (adult) (pediatric): Secondary | ICD-10-CM | POA: Diagnosis not present

## 2020-05-07 ENCOUNTER — Telehealth: Payer: Self-pay

## 2020-05-08 ENCOUNTER — Other Ambulatory Visit: Payer: Self-pay

## 2020-05-08 ENCOUNTER — Ambulatory Visit (INDEPENDENT_AMBULATORY_CARE_PROVIDER_SITE_OTHER): Payer: PPO | Admitting: Neurology

## 2020-05-08 ENCOUNTER — Encounter: Payer: Self-pay | Admitting: Neurology

## 2020-05-08 VITALS — BP 152/78 | HR 64 | Ht 59.0 in | Wt 164.3 lb

## 2020-05-08 DIAGNOSIS — G4733 Obstructive sleep apnea (adult) (pediatric): Secondary | ICD-10-CM

## 2020-05-08 DIAGNOSIS — G2581 Restless legs syndrome: Secondary | ICD-10-CM

## 2020-05-08 DIAGNOSIS — Z9989 Dependence on other enabling machines and devices: Secondary | ICD-10-CM | POA: Diagnosis not present

## 2020-05-08 NOTE — Patient Instructions (Addendum)
You have done a great job using your AutoPap machine.  You're compliant with treatment and your sleep apnea score is looking good on the current pressure settings.  You can try to increase the moisture in your machine so you don't have that much mouth dryness.  Please try to hydrate well with water.  Your air leaks a little bit, some days worse than others, please try to use your chinstrap consistently for the next 2 weeks and we can review a compliance download afterwards to see if it makes a difference when you use your chinstrap.  Call us or email [place] after about 2 or maybe 3 weeks of using the chinstrap.  You can continue with gabapentin as needed 100 mg strength, for your restless legs.  Your prescription is up-to-date.  Please continue using your autoPAP regularly. While your insurance requires that you use PAP at least 4 hours each night on 70% of the nights, I recommend, that you not skip any nights and use it throughout the night if you can. Getting used to PAP and staying with the treatment long term does take time and patience and discipline. Untreated obstructive sleep apnea when it is moderate to severe can have an adverse impact on cardiovascular health and raise her risk for heart disease, arrhythmias, hypertension, congestive heart failure, stroke and diabetes. Untreated obstructive sleep apnea causes sleep disruption, nonrestorative sleep, and sleep deprivation. This can have an impact on your day to day functioning and cause daytime sleepiness and impairment of cognitive function, memory loss, mood disturbance, and problems focussing. Using PAP regularly can improve these symptoms.  Please follow-up to see the nurse practitioner in 6 months, sooner if needed.  We will call you or email you back after reviewing your compliance download.

## 2020-05-08 NOTE — Progress Notes (Signed)
Subjective:    Patient ID: Martha Pham is a 69 y.o. female.  HPI    Interim history:   Martha Pham is a 69 year old right-handed woman with an underlying medical history of hyperlipidemia, hypertension, vitamin D deficiency, chronic kidney disease, breast cancer in 2012 (s/p lumpectomies, L mastectomy, and chemo), low back pain and obesity, who presents for follow-up consultation of her restless leg syndrome, on gabapentin prn and OSA, on autoPAP. The patient is unaccompanied today. I last saw her on 11/02/2019, at which time she was on gabapentin as needed for restless leg symptoms.  In that regard she was doing well.  She had noticed an increase in her snoring, which was also noted by her family and she had noticed increase in her daytime somnolence.  She was interested in being reevaluated for sleep apnea.  She had a baseline sleep study on 11/30/2019 which showed a sleep efficiency of 87.9%, sleep latency 20 minutes, REM latency 87.5 minutes.  She had a total AHI in the mild range at 9.3/h, REM AHI in the severe range at 33.8/h, average oxygen saturation of 97%, nadir of 77%.  She was advised to start AutoPap therapy.  Her set up date was 02/29/2020.  Today, 05/08/2020: I reviewed her AutoPap compliance data from 04/06/2020 through 05/05/2020, which is a total of 30 days, during which time she used her machine 29 days with percent use days greater than 4 hours at 97%, indicating excellent compliance with an average usage of 7 hours and 14 minutes, residual AHI at goal at 1.1/h, 95th percentile of pressure at 11.7 cm with a range of 6 to 12 cm with EPR of 3, leak in the high acceptable range with the 95th percentile at 18.8 L/min.  She reports doing well with her AutoPap.  She has adapted well to treatment.  She tried a full facemask 1st but after about a week she realized that she could not keep the mask on consistently.  She switched out for a nasal pillows interface and also received a chinstrap which  she has used from time to time.  She does have some mouth dryness, some days worse than others.  She tries to hydrate well with water but could do better she believes.  She has done well otherwise.  She feels improved as far as her daytime somnolence.  She continues to use gabapentin for restless leg symptoms, has had some intermittent numbness affecting her hands and sometimes her feet but no consistent numbness.  She has been labeled as prediabetic.  Last A1c from May 2021 was in the prediabetes range at 5.7.  Her daughter and her family moved from [place] to [place].  The patient has been able to visit them already.  She is planning another visit in December.   The patient's allergies, current medications, family history, past medical history, past social history, past surgical history and problem list were reviewed and updated as appropriate.    Previously (copied from previous notes for reference):      I saw her on 09/08/2018, at which time she reported doing well.  She had lost weight.  She was stable on low-dose gabapentin prn.  She was advised to follow-up in 1 year.       I saw her on 09/02/2017, at which time she reported that she eventually had stopped taking gabapentin. She did have some intermittent leg discomfort, left side more than right. She was interested in restarting gabapentin as needed, which I prescribed.  Of note, she no showed for an appointment on 06/22/2017. I saw her on 06/19/2016, at which time she reported improvement in her leg pain. She had not been taking gabapentin regularly. She had improvement in her lower back pain after she went to the chiropractor. She was advised to follow-up routinely in one year and utilize gabapentin as needed.   I saw her on 06/05/2015, at which time she reported doing fairly well. She had noticed improvement of her restless leg symptoms on gabapentin. She had some low back pain, was not sure if the gabapentin was helpful for that as  well. She was stable and I suggested a one-year checkup. She did not realize that she had refills on her gabapentin and I made sure she had enough refills to last her until her next appointment.   I saw her on 11/30/2014, at which time she reported feeling fairly stable. She had some low back pain with radiation to the left. She had occasional restless leg symptoms. She had no new complaints. I suggested a trial of low-dose gabapentin at night, 100 mg strength as needed for residual restless leg symptoms and to help improve her back discomfort.    I first met her on 10/31/2014 at the request of her primary care physician, at which time the patient reported a prior diagnosis of obstructive sleep apnea. I suggested we bring her back for sleep study. She is a baseline sleep study on 11/06/2014 and underwent over her test results with her in detail today. Her sleep efficiency was reduced at 78.7% with a normal sleep latency and wake after sleep onset of 71.5 minutes with moderate to severe sleep fragmentation noted. She had an elevated arousal index. She had an increased percentage of stage I and stage II sleep, near absence of slow-wave sleep and a markedly decreased percentage of REM sleep at 3.9% with a very long REM latency. She had mild PLMS at 13.9 per hour, with arousals. She had a normal EKG and EEG. She had mild to moderate intermittent snoring. Her total AHI was normal at 1.4 per hour, baseline oxygen saturation 96%, nadir was 90%. REM AHI was 26.1 per hour.   She was diagnosed with obstructive sleep apnea several years ago. She was placed on CPAP therapy. I reviewed her baseline sleep study results performed at the [facility] on 08/05/2009: Sleep efficiency was 91.4%. REM latency was 209 minutes. Arousal index was 19.1 per hour. Her total AHI was 9.3 per hour. Her REM AHI was 36.7 per hour. Average oxygen saturation was 96%, nadir was 82%. CPAP titration was recommended.    I  reviewed her blood test results from 09/14/2014 through your office: TSH was unremarkable, hemoglobin A1c was 5.6, total cholesterol 177, HDL 58, LDL 97, CMP unremarkable, CBC with differential unremarkable very she had an EKG on 09/14/2014 which upon my review looked unremarkable.   She brought her CPAP machine but reports that she has not used it in the last few months because of air leaking. There is no SD card of the machine.    She has lack of energy and occasional AM HAs, occasional night time palpitations and nocturia x 2 on average. In February, her GYN noted a new heart murmur, but her during her appointment on 09/14/14 showed no murmur and EKG was fine.    Her bedtime is around 11:30 PM and typically she falls asleep quickly. Sometimes she wakes up in the middle of the night other  than going to the bathroom. She has restless leg symptoms almost on a nightly basis but is not sure if she twitches or kicks in her sleep. She has no parasomnias. Her rise time is around 8 and she wakes up marginally rested on most days. She sleeps alone. She lives alone. She has one daughter who lives in the area but will be moving to [place] soon. The patient is retired and worked for [employer]. She is a nonsmoker and drinks alcohol rarely. She drinks coffee maybe once a day.   Her Past Medical History Is Significant For: Past Medical History:  Diagnosis Date   Apnea, sleep    does wear cpap   Arthritis    Cancer ([place])    lt. breast ca-snbx   Chronic kidney disease    Contact lens/glasses fitting    wears contacts or glasses   COPD (chronic obstructive pulmonary disease) (H[facility])    brother   Heart murmur    Hypercholesteremia    Hyperlipemia    Hypertension    Lower back pain    Lung cancer ([place])    Malaise and fatigue    Personal history of chemotherapy 2013   Sleep apnea    SLEEP STUDY DX 10,CPAP BUT DON'TUSE   Vitamin D deficiency    Wears dentures    upper    Her Past  Surgical History Is Significant For: Past Surgical History:  Procedure Laterality Date   BREAST BIOPSY Left 05/26/2011   malignant   BREAST BIOPSY Right 10/29/2016   BREAST IMPLANT EXCHANGE Left 09/23/2012   Procedure: REMOVAL OF LEFT EXPANDER/PLACEMENT OF IMPLANT WITH REDUCTION OF RIGHT BREAST;  Surgeon: Theodoro Kos, DO;  Location: [facility];  Service: Plastics;  Laterality: Left;   BREAST LUMPECTOMY W/ NEEDLE LOCALIZATION  06/24/2011   left   BREAST REDUCTION SURGERY Right 09/23/2012   Procedure: MAMMARY REDUCTION  (BREAST);  Surgeon: Theodoro Kos, DO;  Location: [facility];  Service: Plastics;  Laterality: Right;   BREAST SURGERY  08/07/2011   re-exc. left breast margins   CYST REMOVAL NECK  08/2013   Dr. Migdalia Dk   LIPOSUCTION Bilateral 09/23/2012   Procedure: LIPOSUCTION;  Surgeon: Theodoro Kos, DO;  Location: [facility];  Service: Plastics;  Laterality: Bilateral;   MASTECTOMY Left 2012   MOUTH SURGERY     TOOTH IMPLANT BOTTOM X2 LFT   PORT-A-CATH REMOVAL Right 09/23/2012   Procedure: REMOVAL PORT-A-CATH;  Surgeon: Theodoro Kos, DO;  Location: [facility];  Service: Plastics;  Laterality: Right;   PORTACATH PLACEMENT  06/24/2011   Procedure: INSERTION PORT-A-CATH;  Surgeon: Judieth Keens, DO;  Location: [facility];  Service: General;  Laterality: Right;  Right mediport placement   RECONSTRUCTION BREAST IMMEDIATE / DELAYED W/ TISSUE EXPANDER  10/13   left   REDUCTION MAMMAPLASTY Right 2013   SIMPLE MASTECTOMY  09/01/2011   left   TONSILLECTOMY      Her Family History Is Significant For: Family History  Problem Relation Age of Onset   Brain cancer Father    Heart attack Mother    Breast cancer Sister 47   Breast cancer Sister 65    Her Social History Is Significant For: Social History   Socioeconomic History   Marital status: Widowed    Spouse name: Not on file   Number of  children: 1   Years of education: 14   Highest education level: Not on file  Occupational History  Occupation: Retired  Tobacco Use   Smoking status: Never Smoker   Smokeless tobacco: Never Used  Scientific laboratory technician Use: Never used  Substance and Sexual Activity   Alcohol use: Yes    Alcohol/week: 2.0 standard drinks    Types: 2 Glasses of wine per week    Comment: 8 oz occasionally per week   Drug use: No   Sexual activity: Yes    Partners: Male    Birth control/protection: Post-menopausal  Other Topics Concern   Not on file  Social History Narrative   Daughter [place], [place]   Social Determinants of Health   Financial Resource Strain: Low Risk    Difficulty of Paying Living Expenses: Not hard at all  Food Insecurity:    Worried About Charity fundraiser in the Last Year: Not on file   [employer] of Food in the Last Year: Not on file  Transportation Needs:    Lack of Transportation (Medical): Not on file   Lack of Transportation (Non-Medical): Not on file  Physical Activity:    Days of Exercise per Week: Not on file   Minutes of Exercise per Session: Not on file  Stress:    Feeling of Stress : Not on file  Social Connections:    Frequency of Communication with Friends and Family: Not on file   Frequency of Social Gatherings with Friends and Family: Not on file   Attends Religious Services: Not on file   Active Member of Clubs or Organizations: Not on file   Attends Archivist Meetings: Not on file   Marital Status: Not on file    Her Allergies Are:  Allergies  Allergen Reactions   Dust Mite Extract Itching   Other     grass  :   Her Current Medications Are:  Outpatient Encounter Medications as of 05/08/2020  Medication Sig   acetaminophen (TYLENOL) 500 MG tablet Take 1,000 mg by mouth every 6 (six) hours as needed for moderate pain.    Ascorbic Acid (VITAMIN C) 1000 MG tablet Take 1,000 mg by mouth daily.    atorvastatin (LIPITOR) 20 MG tablet Take 1 tablet (20 mg total) by mouth daily.   calcium carbonate (OS-CAL) 600 MG TABS Take 600 mg by mouth 2 (two) times daily with a meal.     Carboxymethylcellulose Sodium (REFRESH OP) Apply 1 drop to eye 2 (two) times daily as needed. For dry eyes   Cyanocobalamin (VITAMIN B-12) 2500 MCG SUBL Place 1 tablet under the tongue daily.   fish oil-omega-3 fatty acids 1000 MG capsule Take 2 g by mouth every evening.    gabapentin (NEURONTIN) 100 MG capsule Take 1 pill at bedtime as needed for leg pain.   hydrochlorothiazide (HYDRODIURIL) 25 MG tablet Take 1 tablet (25 mg total) by mouth daily.   losartan (COZAAR) 100 MG tablet Take 1 tablet (100 mg total) by mouth daily.   Magnesium 400 MG CAPS Take by mouth daily.    metoprolol succinate (TOPROL-XL) 25 MG 24 hr tablet Take 1 tablet (25 mg total) by mouth daily.   Vitamin D, Cholecalciferol, 50 MCG (2000 UT) CAPS Take by mouth.    No facility-administered encounter medications on file as of 05/08/2020.  :  Review of Systems:  Out of a complete 14 point review of systems, all are reviewed and negative with the exception of these symptoms as listed below: Review of Systems  Neurological:       Here for f/u  on cpap. Reports over all she has been doing well and feels better on cpap.    Objective:  Neurological Exam  Physical Exam Physical Examination:   Vitals:   05/08/20 1309  BP: (!) 152/78  Pulse: 64  SpO2: 96%    General Examination: The patient is a very pleasant 69 y.o. female in no acute distress. She appears well-developed and well-nourished and well groomed.  Good spirits.  HEENT:Normocephalic, atraumatic, pupils are equal, round and reactive to light and accommodation.Corrective eyeglasses in place. Extraocular tracking is good, hearing is grossly intact. Face is symmetric. Speech is clear, no dysarthria, no voice tremor. Airway examination shows mild mouth dryness, no significant  crowding. Tongue protrudes centrally and palate elevates symmetrically.  Chest:Clear to auscultation without wheezing, rhonchi or crackles noted.  Heart:S1+S2+0, regular and normal without murmurs, rubs or gallops noted.   Abdomen:Soft, non-tender and non-distended.  Extremities:There is no pitting edema in the distal lower extremities bilaterally.   Skin: Warm and dry without trophic changes noted.  Musculoskeletal: exam reveals no obvious joint deformities, tenderness or joint swelling or erythema.   Neurologically:  Mental status: The patient is awake, alert and oriented in all 4 spheres. Her immediate and remote memory, attention, language skills and fund of knowledge are appropriate. There is no evidence of aphasia, agnosia, apraxia or anomia. Speech is clear with normal prosody and enunciation. Thought process is linear. Mood is normal and affect is normal.  Cranial nerves II - XII are as described above under HEENT exam.  Motor exam: Normal bulk, strength and tone is noted. There is no drift, tremor or rebound. Fine motor skills and coordination:grosslyintact.  Cerebellar testing: No dysmetria or intention tremor. There is no truncal or gait ataxia.  Sensory exam: intact to light touch in the upper and lower extremities.  Gait, station and balance: no issues.   Assessment and Plan:   In summary, Martha Pham is a very pleasant 69 year old female with an underlying medical history of hyperlipidemia, hypertension, vitamin D deficiency, chronic kidney disease, low back pain and mild obesity, who presents for follow-up consultation of her restless leg syndrome, as well as her sleep apnea after interim sleep testing.  She had a baseline sleep study on 11/30/2019 which showed overall mild sleep apnea, more pronounced during REM sleep.  She has established AutoPap therapy, set up date was 02/29/2020.  She is compliant with treatment and has had good results, feels improved and  her daytime somnolence and sleep quality.  She is commended for her treatment adherence.  Leak has been high from time to time, she uses nasal pillows, could not tolerate the full facemask.  She has a chinstrap but has not used it consistently.  She is advised to use her chinstrap consistently for the next couple of weeks and we can review her download, see if it makes a difference to the air leakage.  For mouth dryness, she is reminded to stay well-hydrated throughout the day and also try to increase the moisture setting on her AutoPap machine.  She is advised to continue with gabapentin as needed for restless leg symptoms.  She is advised to follow-up routinely to see one of our nurse practitioners in 6 months, sooner if needed.  I answered all her questions today and she was in agreement with the plan.  I spent 30 minutes in total face-to-face time and in reviewing records during pre-charting, more than 50% of which was spent in counseling and coordination of care,  reviewing test results, reviewing medications and treatment regimen and/or in discussing or reviewing the diagnosis of OSA, RLS, the prognosis and treatment options. Pertinent laboratory and imaging test results that were available during this visit with the patient were reviewed by me and considered in my medical decision making (see chart for details).

## 2020-05-25 ENCOUNTER — Other Ambulatory Visit: Payer: Self-pay | Admitting: Internal Medicine

## 2020-05-25 DIAGNOSIS — Z1231 Encounter for screening mammogram for malignant neoplasm of breast: Secondary | ICD-10-CM

## 2020-05-30 ENCOUNTER — Ambulatory Visit (INDEPENDENT_AMBULATORY_CARE_PROVIDER_SITE_OTHER): Payer: PPO

## 2020-05-30 ENCOUNTER — Other Ambulatory Visit: Payer: Self-pay

## 2020-05-30 ENCOUNTER — Encounter: Payer: Self-pay | Admitting: Internal Medicine

## 2020-05-30 ENCOUNTER — Ambulatory Visit (INDEPENDENT_AMBULATORY_CARE_PROVIDER_SITE_OTHER): Payer: PPO | Admitting: Internal Medicine

## 2020-05-30 VITALS — BP 134/82 | HR 62 | Temp 98.5°F | Ht 59.0 in | Wt 168.2 lb

## 2020-05-30 VITALS — BP 134/82 | HR 62 | Temp 98.5°F | Ht 59.0 in | Wt 168.0 lb

## 2020-05-30 DIAGNOSIS — Z23 Encounter for immunization: Secondary | ICD-10-CM

## 2020-05-30 DIAGNOSIS — Z0000 Encounter for general adult medical examination without abnormal findings: Secondary | ICD-10-CM

## 2020-05-30 DIAGNOSIS — E1122 Type 2 diabetes mellitus with diabetic chronic kidney disease: Secondary | ICD-10-CM | POA: Diagnosis not present

## 2020-05-30 DIAGNOSIS — I129 Hypertensive chronic kidney disease with stage 1 through stage 4 chronic kidney disease, or unspecified chronic kidney disease: Secondary | ICD-10-CM

## 2020-05-30 DIAGNOSIS — E66811 Obesity, class 1: Secondary | ICD-10-CM

## 2020-05-30 DIAGNOSIS — E6609 Other obesity due to excess calories: Secondary | ICD-10-CM | POA: Diagnosis not present

## 2020-05-30 DIAGNOSIS — N182 Chronic kidney disease, stage 2 (mild): Secondary | ICD-10-CM

## 2020-05-30 DIAGNOSIS — Z6833 Body mass index (BMI) 33.0-33.9, adult: Secondary | ICD-10-CM | POA: Diagnosis not present

## 2020-05-30 DIAGNOSIS — R109 Unspecified abdominal pain: Secondary | ICD-10-CM

## 2020-05-30 LAB — POCT URINALYSIS DIPSTICK
Bilirubin, UA: NEGATIVE
Blood, UA: NEGATIVE
Glucose, UA: NEGATIVE
Ketones, UA: NEGATIVE
Leukocytes, UA: NEGATIVE
Nitrite, UA: NEGATIVE
Protein, UA: NEGATIVE
Spec Grav, UA: 1.025 (ref 1.010–1.025)
Urobilinogen, UA: 0.2 E.U./dL
pH, UA: 5.5 (ref 5.0–8.0)

## 2020-05-30 LAB — POCT UA - MICROALBUMIN
Albumin/Creatinine Ratio, Urine, POC: <30
Creatinine, POC: 200 mg/dL
Microalbumin Ur, POC: 10 mg/L

## 2020-05-30 MED ORDER — BOOSTRIX 5-2.5-18.5 LF-MCG/0.5 IM SUSP: 0.5000 mL | Freq: Once | INTRAMUSCULAR | 0 refills | Status: AC

## 2020-05-30 NOTE — Progress Notes (Signed)
This visit occurred during the SARS-CoV-2 public health emergency.  Safety protocols were in place, including screening questions prior to the visit, additional usage of staff PPE, and extensive cleaning of exam room while observing appropriate contact time as indicated for disinfecting solutions.  Subjective:   Martha Pham is a 69 y.o. female who presents for Medicare Annual (Subsequent) preventive examination.  Review of Systems     Cardiac Risk Factors include: advanced age (>47men, >34 women);diabetes mellitus;hypertension;sedentary lifestyle     Objective:    Today's Vitals   05/30/20 0904 05/30/20 0905  BP: 134/82   Pulse: 62   Temp: 98.5 F (36.9 C)   TempSrc: Oral   Weight: 168 lb (76.2 kg)   Height: 4\' 11"  (1.499 m)   PainSc:  8    Body mass index is 33.93 kg/m.  Advanced Directives 05/30/2020 05/12/2019 02/26/2018 10/02/2015 04/03/2015 10/03/2014 09/20/2012  Does Patient Have a Medical Advance Directive? No No No No No No Patient does not have advance directive  Would patient like information on creating a medical advance directive? Yes (MAU/Ambulatory/Procedural Areas - Information given) Yes (Inpatient - patient requests chaplain consult to create a medical advance directive) - - No - patient declined information - -  Pre-existing out of facility DNR order (yellow form or pink MOST form) - - - - - - -    Current Medications (verified) Outpatient Encounter Medications as of 05/30/2020  Medication Sig  . acetaminophen (TYLENOL) 500 MG tablet Take 1,000 mg by mouth every 6 (six) hours as needed for moderate pain.   . Ascorbic Acid (VITAMIN C) 1000 MG tablet Take 1,000 mg by mouth daily.  Marland Kitchen atorvastatin (LIPITOR) 20 MG tablet Take 1 tablet (20 mg total) by mouth daily.  . calcium carbonate (OS-CAL) 600 MG TABS Take 600 mg by mouth 2 (two) times daily with a meal.    . Carboxymethylcellulose Sodium (REFRESH OP) Apply 1 drop to eye 2 (two) times daily as needed. For dry  eyes  . Cyanocobalamin (VITAMIN B-12) 2500 MCG SUBL Place 1 tablet under the tongue daily.  . fish oil-omega-3 fatty acids 1000 MG capsule Take 2 g by mouth every evening.   . gabapentin (NEURONTIN) 100 MG capsule Take 1 pill at bedtime as needed for leg pain.  . hydrochlorothiazide (HYDRODIURIL) 25 MG tablet Take 1 tablet (25 mg total) by mouth daily.  Marland Kitchen losartan (COZAAR) 100 MG tablet Take 1 tablet (100 mg total) by mouth daily.  . Magnesium 400 MG CAPS Take by mouth daily.   . metoprolol succinate (TOPROL-XL) 25 MG 24 hr tablet Take 1 tablet (25 mg total) by mouth daily.  . Tdap (BOOSTRIX) 5-2.5-18.5 LF-MCG/0.5 injection Inject 0.5 mLs into the muscle once for 1 dose.  . Vitamin D, Cholecalciferol, 50 MCG (2000 UT) CAPS Take by mouth.    No facility-administered encounter medications on file as of 05/30/2020.    Allergies (verified) Dust mite extract and Other   History: Past Medical History:  Diagnosis Date  . Apnea, sleep    does wear cpap  . Arthritis   . Cancer ([place])    lt. breast ca-snbx  . Chronic kidney disease   . Contact lens/glasses fitting    wears contacts or glasses  . COPD (chronic obstructive pulmonary disease) ([facility])    brother  . Heart murmur   . Hypercholesteremia   . Hyperlipemia   . Hypertension   . Lower back pain   . Lung cancer ([place])   .  Malaise and fatigue   . Personal history of chemotherapy 2013  . Sleep apnea    SLEEP STUDY DX 10,CPAP BUT DON'TUSE  . Vitamin D deficiency   . Wears dentures    upper   Past Surgical History:  Procedure Laterality Date  . BREAST BIOPSY Left 05/26/2011   malignant  . BREAST BIOPSY Right 10/29/2016  . BREAST IMPLANT EXCHANGE Left 09/23/2012   Procedure: REMOVAL OF LEFT EXPANDER/PLACEMENT OF IMPLANT WITH REDUCTION OF RIGHT BREAST;  Surgeon: Theodoro Kos, DO;  Location: [facility];  Service: Plastics;  Laterality: Left;  . BREAST LUMPECTOMY W/ NEEDLE LOCALIZATION  06/24/2011   left  . BREAST  REDUCTION SURGERY Right 09/23/2012   Procedure: MAMMARY REDUCTION  (BREAST);  Surgeon: Theodoro Kos, DO;  Location: [facility];  Service: Plastics;  Laterality: Right;  . BREAST SURGERY  08/07/2011   re-exc. left breast margins  . CYST REMOVAL NECK  08/2013   Dr. Migdalia Dk  . LIPOSUCTION Bilateral 09/23/2012   Procedure: LIPOSUCTION;  Surgeon: Theodoro Kos, DO;  Location: [facility];  Service: Plastics;  Laterality: Bilateral;  . MASTECTOMY Left 2012  . MOUTH SURGERY     TOOTH IMPLANT BOTTOM X2 LFT  . PORT-A-CATH REMOVAL Right 09/23/2012   Procedure: REMOVAL PORT-A-CATH;  Surgeon: Theodoro Kos, DO;  Location: [facility];  Service: Plastics;  Laterality: Right;  . PORTACATH PLACEMENT  06/24/2011   Procedure: INSERTION PORT-A-CATH;  Surgeon: Judieth Keens, DO;  Location: [facility];  Service: General;  Laterality: Right;  Right mediport placement  . RECONSTRUCTION BREAST IMMEDIATE / DELAYED W/ TISSUE EXPANDER  10/13   left  . REDUCTION MAMMAPLASTY Right 2013  . SIMPLE MASTECTOMY  09/01/2011   left  . TONSILLECTOMY     Family History  Problem Relation Age of Onset  . Brain cancer Father   . Heart attack Mother   . Breast cancer Sister 31  . Breast cancer Sister 36   Social History   Socioeconomic History  . Marital status: Widowed    Spouse name: Not on file  . Number of children: 1  . Years of education: 9  . Highest education level: Not on file  Occupational History  . Occupation: Retired  Tobacco Use  . Smoking status: Never Smoker  . Smokeless tobacco: Never Used  Vaping Use  . Vaping Use: Never used  Substance and Sexual Activity  . Alcohol use: Not Currently    Alcohol/week: 2.0 standard drinks    Types: 2 Glasses of wine per week    Comment: 8 oz occasionally per week  . Drug use: No  . Sexual activity: Yes    Partners: Male    Birth control/protection: Post-menopausal  Other Topics Concern  . Not on  file  Social History Narrative   Daughter [place], [place]   Social Determinants of Health   Financial Resource Strain: Low Risk   . Difficulty of Paying Living Expenses: Not hard at all  Food Insecurity: No Food Insecurity  . Worried About Charity fundraiser in the Last Year: Never true  . Ran Out of Food in the Last Year: Never true  Transportation Needs: No Transportation Needs  . Lack of Transportation (Medical): No  . Lack of Transportation (Non-Medical): No  Physical Activity: Inactive  . Days of Exercise per Week: 0 days  . Minutes of Exercise per Session: 0 min  Stress: No Stress Concern Present  . Feeling of Stress :  Not at all  Social Connections:   . Frequency of Communication with Friends and Family: Not on file  . Frequency of Social Gatherings with Friends and Family: Not on file  . Attends Religious Services: Not on file  . Active Member of Clubs or Organizations: Not on file  . Attends Archivist Meetings: Not on file  . Marital Status: Not on file    Tobacco Counseling Counseling given: Not Answered   Clinical Intake:  Pre-visit preparation completed: Yes  Pain : 0-10 Pain Score: 8  Pain Type: Acute pain Pain Location: Back Pain Orientation: Lower, Right Pain Descriptors / Indicators: Aching Pain Onset: In the past 7 days Pain Frequency: Constant Pain Relieving Factors: haven't tried anything  Pain Relieving Factors: haven't tried anything  Nutritional Status: BMI > 30  Obese Nutritional Risks: None Diabetes: Yes  How often do you need to have someone help you when you read instructions, pamphlets, or other written materials from your doctor or pharmacy?: 1 - Never What is the last grade level you completed in school?: 38yrs college  Diabetic? Yes Nutrition Risk Assessment:  Has the patient had any N/V/D within the last 2 months?  No  Does the patient have any non-healing wounds?  No  Has the patient had any unintentional weight  loss or weight gain?  No   Diabetes:  Is the patient diabetic?  Yes  If diabetic, was a CBG obtained today?  No  Did the patient bring in their glucometer from home?  No  How often do you monitor your CBG's? none.   Financial Strains and Diabetes Management:  Are you having any financial strains with the device, your supplies or your medication? No .  Does the patient want to be seen by Chronic Care Management for management of their diabetes?  No  Would the patient like to be referred to a Nutritionist or for Diabetic Management?  No   Diabetic Exams:  Diabetic Eye Exam: Overdue for diabetic eye exam. Pt has been advised about the importance in completing this exam. Patient advised to call and schedule an eye exam. Diabetic Foot Exam: Completed today   Interpreter Needed?: No  Information entered by :: NAllen LPN   Activities of Daily Living In your present state of health, do you have any difficulty performing the following activities: 05/30/2020 05/30/2020  Hearing? N N  Vision? N N  Difficulty concentrating or making decisions? N N  Walking or climbing stairs? N N  Dressing or bathing? N N  Doing errands, shopping? N N  Preparing Food and eating ? N -  Using the Toilet? N -  In the past six months, have you accidently leaked urine? N -  Do you have problems with loss of bowel control? N -  Managing your Medications? N -  Some recent data might be hidden    Patient Care Team: Glendale Chard, MD as PCP - General (Internal Medicine) [place], Kennieth Francois, [facility] (Inactive) (Pharmacist)  Indicate any recent Medical Services you may have received from other than [facility] providers in the past year (date may be approximate).     Assessment:   This is a routine wellness examination for [place].  Hearing/Vision screen  Hearing Screening   125Hz  250Hz  500Hz  1000Hz  2000Hz  3000Hz  4000Hz  6000Hz  8000Hz   Right ear:           Left ear:           Vision Screening Comments: Regular  eye exams,  Dr. Sherral Hammers   Dietary issues and exercise activities discussed: Current Exercise Habits: The patient does not participate in regular exercise at present  Goals    . Patient Stated     05/30/2020, wants to lose 30 pounds    . Pharmacy Care Plan     CARE PLAN ENTRY (see longitudinal plan of care for additional care plan information)  Current Barriers:  . Chronic Disease Management support, education, and care coordination needs related to Hypertension, Hyperlipidemia, Diabetes, and Osteopenia/Osteoporosis Screening   Hypertension BP Readings from Last 3 Encounters:  12/12/19 (!) 147/72  12/05/19 128/70  11/21/19 118/66   . Pharmacist Clinical Goal(s): o Over the next 180 days, patient will work with PharmD and providers to maintain BP goal <140/90 . Current regimen:   Hydrochlorothiazide 25mg  daily  Losartan 100mg  daily  Metoprolol succinate 25mg  daily . Interventions: o Provided dietary and exercise recommendations . Patient self care activities - Over the next 180 days, patient will: o Check BP weekly and if symptomatic, document, and provide at future appointments o Ensure daily salt intake < 2300 mg/day o Exercise for 30 minutes daily 5 times per week (150 minutes total per week)  Hyperlipidemia Lab Results  Component Value Date/Time   LDLCALC 96 05/12/2019 10:04 AM   . Pharmacist Clinical Goal(s): o Over the next 90 days, patient will work with PharmD and providers to achieve LDL goal < 70 . Current regimen:  . Atorvastatin 20mg  daily . Omega-3 fatty acids 2000mg  2 capsules every evening . Interventions: o Provided dietary and exercise recommendations o Patient complained of size and smell of omega 3 capsules - Advised patient she could try odorless fish oil or try krill oil (smaller capsules) . Patient self care activities - Over the next 90 days, patient will: o Exercise for 30 minutes daily 5 times per week (150 minutes total per week) o Try  odorless fish oil or krill oil  Prediabetes Lab Results  Component Value Date/Time   HGBA1C 5.7 (H) 11/21/2019 04:10 PM   HGBA1C 5.6 05/12/2019 10:04 AM   . Pharmacist Clinical Goal(s): o Over the next 180 days, patient will work with PharmD and providers to maintain A1c goal <7% . Current regimen:  o N/A . Interventions: o Provided dietary and exercise recommendations o Recommend patient increase water intake to 64 ounces daily o Discussed importance of diet and exercise to lower A1c back to normal range . Patient self care activities - Over the next 180 days, patient will: o Check blood sugar occasionally, document, and provide at future appointments o Contact provider with any episodes of hypoglycemia o Exercise for 30 minutes daily 5 times per week (150 minutes total per week)  Osteopenia/Osteoporosis Screening . Pharmacist Clinical Goal(s) o Over the next 180 days, patient will work with PharmD and providers to screen patient for osteopenia/osteoporosis and supplement appropriately . Current regimen:  o N/A . Interventions: o Recommend DEXA scan (last order expired) o Recommend Vitamin D level o Recommend Vitamin D 2000 units daily and 1200mg  calcium daily o Recommend weight-bearing exercises 3 times weekly to build and maintain bone density . Patient self care activities - Over the next 180 days, patient will: o Have bone density scan performed o Start taking Vitamin D3 2000 units daily and calcium 1200mg  daily o Start weight-bearing exercises three times weekly  Medication management . Pharmacist Clinical Goal(s): o Over the next 180 days, patient will work with PharmD and providers to maintain optimal medication adherence .  Current pharmacy: [facility] . Interventions o Comprehensive medication review performed. o Continue current medication management strategy . Patient self care activities - Over the next 180 days, patient will: o Focus on medication adherence by  continued use of pill box o Take medications as prescribed o Report any questions or concerns to PharmD and/or provider(s)  Initial goal documentation     . Weight (lb) < 200 lb (90.7 kg)     At least 20 lbs and increase her exercise      Depression Screen PHQ 2/9 Scores 05/30/2020 05/30/2020 05/12/2019 05/12/2019 01/20/2019 09/21/2018 06/22/2018  PHQ - 2 Score 0 0 1 3 3  0 0  PHQ- 9 Score - - - 9 8 - -    Fall Risk Fall Risk  05/30/2020 05/30/2020 05/12/2019 05/12/2019 01/20/2019  Falls in the past year? 0 0 0 0 0  Risk for fall due to : Medication side effect - - - -  Follow up Falls evaluation completed;Education provided;Falls prevention discussed - - - -    Any stairs in or around the home? Yes  If so, are there any without handrails? No  Home free of loose throw rugs in walkways, pet beds, electrical cords, etc? Yes  Adequate lighting in your home to reduce risk of falls? Yes   ASSISTIVE DEVICES UTILIZED TO PREVENT FALLS:  Life alert? No  Use of a cane, walker or w/c? No  Grab bars in the bathroom? Yes  Shower chair or bench in shower? Yes  Elevated toilet seat or a handicapped toilet? Yes   TIMED UP AND GO:  Was the test performed? No .   Gait steady and fast without use of assistive device  Cognitive Function:     6CIT Screen 05/30/2020 05/12/2019  What Year? 0 points 0 points  What month? 0 points 0 points  What time? 0 points 0 points  Count back from 20 0 points 0 points  Months in reverse 2 points 0 points  Repeat phrase 0 points 8 points  Total Score 2 8    Immunizations Immunization History  Administered Date(s) Administered  . Influenza, High Dose Seasonal PF 05/05/2018, 04/14/2019  . Influenza,inj,Quad PF,6+ Mos 04/04/2014  . PFIZER SARS-COV-2 Vaccination 11/03/2019, 11/28/2019  . Pneumococcal Polysaccharide-23 05/12/2019    TDAP status: Due, Education has been provided regarding the importance of this vaccine. Advised may receive this vaccine  at local pharmacy or Health Dept. Aware to provide a copy of the vaccination record if obtained from local pharmacy or Health Dept. Verbalized acceptance and understanding. Flu Vaccine status: Completed at today's visit Pneumococcal vaccine status: Up to date Covid-19 vaccine status: Completed vaccines  Qualifies for Shingles Vaccine? Yes   Zostavax completed No   Shingrix Completed?: No.    Education has been provided regarding the importance of this vaccine. Patient has been advised to call insurance company to determine out of pocket expense if they have not yet received this vaccine. Advised may also receive vaccine at local pharmacy or Health Dept. Verbalized acceptance and understanding.  Screening Tests Health Maintenance  Topic Date Due  . TETANUS/TDAP  Never done  . OPHTHALMOLOGY EXAM  05/21/2019  . INFLUENZA VACCINE  02/19/2020  . FOOT EXAM  05/11/2020  . HEMOGLOBIN A1C  05/23/2020  . MAMMOGRAM  07/12/2021  . COLONOSCOPY  12/02/2025  . DEXA SCAN  Completed  . COVID-19 Vaccine  Completed  . Hepatitis C Screening  Completed  . PNA vac Low Risk Adult  Completed  Health Maintenance  Health Maintenance Due  Topic Date Due  . TETANUS/TDAP  Never done  . OPHTHALMOLOGY EXAM  05/21/2019  . INFLUENZA VACCINE  02/19/2020  . FOOT EXAM  05/11/2020  . HEMOGLOBIN A1C  05/23/2020    Colorectal cancer screening: Completed 12/03/2015. Repeat every 5 years Mammogram status: scheduled for January Bone Density status: Completed 11/05/2016.   Lung Cancer Screening: (Low Dose CT Chest recommended if Age 54-80 years, 30 pack-year currently smoking OR have quit w/in 15years.) does not qualify.   Lung Cancer Screening Referral: no  Additional Screening:  Hepatitis C Screening: does qualify; Completed 01/28/2012  Vision Screening: Recommended annual ophthalmology exams for early detection of glaucoma and other disorders of the eye. Is the patient up to date with their annual eye exam?   Yes  Who is the provider or what is the name of the office in which the patient attends annual eye exams? Dr. Sherral Hammers If pt is not established with a provider, would they like to be referred to a provider to establish care? No .   Dental Screening: Recommended annual dental exams for proper oral hygiene  Community Resource Referral / Chronic Care Management: CRR required this visit?  No   CCM required this visit?  No      Plan:     I have personally reviewed and noted the following in the patient's chart:   . Medical and social history . Use of alcohol, tobacco or illicit drugs  . Current medications and supplements . Functional ability and status . Nutritional status . Physical activity . Advanced directives . List of other physicians . Hospitalizations, surgeries, and ER visits in previous 12 months . Vitals . Screenings to include cognitive, depression, and falls . Referrals and appointments  In addition, I have reviewed and discussed with patient certain preventive protocols, quality metrics, and best practice recommendations. A written personalized care plan for preventive services as well as general preventive health recommendations were provided to patient.     Kellie Simmering, LPN   [id number]   Nurse Notes:

## 2020-05-30 NOTE — Progress Notes (Signed)
I,[name],acting as a Education administrator for [name], MD.,have documented all relevant documentation on the behalf of [name], MD,as directed by  [name], MD while in the presence of [name], MD.  This visit occurred during the SARS-CoV-2 public health emergency.  Safety protocols were in place, including screening questions prior to the visit, additional usage of staff PPE, and extensive cleaning of exam room while observing appropriate contact time as indicated for disinfecting solutions.  Subjective:     Patient ID: Martha Pham , female    DOB: 1950/08/31 , 69 y.o.   MRN: [id number]   Chief Complaint  Patient presents with  . Annual Exam  . Hypertension    HPI  The patient is here today for a physical examination.  The patient said that the GYN she previously had has retired and she can't remember his name.  She reports compliance with BP meds. Does not take meds for diabetes, by choice. Does not want to take any additional medication at this time.  Hypertension This is a chronic problem. The current episode started more than 1 year ago. The problem has been gradually improving since onset. The problem is controlled. Risk factors for coronary artery disease include diabetes mellitus, dyslipidemia, post-menopausal state, obesity and sedentary lifestyle. The current treatment provides moderate improvement. Compliance problems include exercise.   Diabetes She presents for her follow-up diabetic visit. She has type 2 diabetes mellitus. Her disease course has been improving. There are no hypoglycemic associated symptoms. There are no diabetic associated symptoms. There are no hypoglycemic complications. Risk factors for coronary artery disease include diabetes mellitus, dyslipidemia, hypertension, obesity and post-menopausal.     Past Medical History:  Diagnosis Date  . Apnea, sleep    does wear cpap  . Arthritis   . Cancer ([place])    lt. breast ca-snbx  .  Chronic kidney disease   . Contact lens/glasses fitting    wears contacts or glasses  . COPD (chronic obstructive pulmonary disease) (HCC)    brother  . Heart murmur   . Hypercholesteremia   . Hyperlipemia   . Hypertension   . Lower back pain   . Lung cancer ([place])   . Malaise and fatigue   . Personal history of chemotherapy 2013  . Sleep apnea    SLEEP STUDY DX 10,CPAP BUT DON'TUSE  . Vitamin D deficiency   . Wears dentures    upper     Family History  Problem Relation Age of Onset  . Brain cancer Father   . Heart attack Mother   . Breast cancer Sister 3  . Breast cancer Sister 63     Current Outpatient Medications:  .  acetaminophen (TYLENOL) 500 MG tablet, Take 1,000 mg by mouth every 6 (six) hours as needed for moderate pain. , Disp: , Rfl:  .  Ascorbic Acid (VITAMIN C) 1000 MG tablet, Take 1,000 mg by mouth daily., Disp: , Rfl:  .  atorvastatin (LIPITOR) 20 MG tablet, Take 1 tablet (20 mg total) by mouth daily., Disp: 90 tablet, Rfl: 2 .  calcium carbonate (OS-CAL) 600 MG TABS, Take 600 mg by mouth 2 (two) times daily with a meal.  , Disp: , Rfl:  .  Carboxymethylcellulose Sodium (REFRESH OP), Apply 1 drop to eye 2 (two) times daily as needed. For dry eyes, Disp: , Rfl:  .  Cyanocobalamin (VITAMIN B-12) 2500 MCG SUBL, Place 1 tablet under the tongue daily., Disp: , Rfl:  .  fish oil-omega-3 fatty acids 1000 MG capsule, Take 2 g by mouth every evening. , Disp: , Rfl:  .  gabapentin (NEURONTIN) 100 MG capsule, Take 1 pill at bedtime as needed for leg pain., Disp: 90 capsule, Rfl: 3 .  hydrochlorothiazide (HYDRODIURIL) 25 MG tablet, Take 1 tablet (25 mg total) by mouth daily., Disp: 90 tablet, Rfl: 2 .  losartan (COZAAR) 100 MG tablet, Take 1 tablet (100 mg total) by mouth daily., Disp: 90 tablet, Rfl: 2 .  Magnesium 400 MG CAPS, Take by mouth daily. , Disp: , Rfl:  .  metoprolol succinate (TOPROL-XL) 25 MG 24 hr tablet, Take 1 tablet (25 mg total) by mouth daily., Disp: 90  tablet, Rfl: 2 .  Vitamin D, Cholecalciferol, 50 MCG (2000 UT) CAPS, Take by mouth. , Disp: , Rfl:    Allergies  Allergen Reactions  . Dust Mite Extract Itching  . Other     grass      The patient states she uses none for birth control. Last LMP was No LMP recorded. Patient is postmenopausal.. Negative for Dysmenorrhea. Negative for: breast discharge, breast lump(s), breast pain and breast self exam. Associated symptoms include abnormal vaginal bleeding. Pertinent negatives include abnormal bleeding (hematology), anxiety, decreased libido, depression, difficulty falling sleep, dyspareunia, history of infertility, nocturia, sexual dysfunction, sleep disturbances, urinary incontinence, urinary urgency, vaginal discharge and vaginal itching. Diet regular.The patient states her exercise level is  intermittent.   . The patient's tobacco use is:  Social History   Tobacco Use  Smoking Status Never Smoker  Smokeless Tobacco Never Used  . She has been exposed to passive smoke. The patient's alcohol use is:  Social History   Substance and Sexual Activity  Alcohol Use Not Currently  . Alcohol/week: 2.0 standard drinks  . Types: 2 Glasses of wine per week   Comment: 8 oz occasionally per week    Review of Systems  Constitutional: Negative.   HENT: Negative.   Eyes: Negative.   Respiratory: Negative.   Cardiovascular: Negative.   Gastrointestinal: Negative.   Endocrine: Negative.   Genitourinary: Negative.   Musculoskeletal: Negative.        She c/o r side pain. There is pain with movement. Denies fall/trauma. Denies urinary sx.   Skin: Negative.   Allergic/Immunologic: Negative.   Neurological: Negative.   Hematological: Negative.   Psychiatric/Behavioral: Negative.      Today's Vitals   05/30/20 0848  BP: 134/82  Pulse: 62  Temp: 98.5 F (36.9 C)  TempSrc: Oral  Weight: 168 lb 3.2 oz (76.3 kg)  Height: 4' 11"  (1.499 m)  PainSc: 8   PainLoc: Hip   Body mass index is  33.97 kg/m.  Wt Readings from Last 3 Encounters:  05/30/20 168 lb 3.2 oz (76.3 kg)  05/30/20 168 lb (76.2 kg)  05/08/20 164 lb 5 oz (74.5 kg)   Objective:  Physical Exam Constitutional:      General: She is not in acute distress.    Appearance: Normal appearance. She is well-developed.  HENT:     Head: Normocephalic and atraumatic.     Right Ear: Hearing, tympanic membrane, ear canal and external ear normal. There is no impacted cerumen.     Left Ear: Hearing, tympanic membrane, ear canal and external ear normal. There is no impacted cerumen.     Nose:     Comments: Deferred, masked    Mouth/Throat:     Comments: Deferred, masked Eyes:     General: Lids are normal.  Extraocular Movements: Extraocular movements intact.     Conjunctiva/sclera: Conjunctivae normal.     Pupils: Pupils are equal, round, and reactive to light.     Funduscopic exam:    Right eye: No papilledema.        Left eye: No papilledema.  Neck:     Thyroid: No thyroid mass.     Vascular: No carotid bruit.  Cardiovascular:     Rate and Rhythm: Normal rate and regular rhythm.     Pulses: Normal pulses.          Dorsalis pedis pulses are 2+ on the right side and 2+ on the left side.     Heart sounds: Normal heart sounds. No murmur heard.   Pulmonary:     Effort: Pulmonary effort is normal.     Breath sounds: Normal breath sounds.  Chest:     Breasts: Tanner Score is 5.     Comments: -Left breast: s/p mastectomy and reconstruction no sign of local recurrence -right breast: s/p reduction, healed surgical scars present, but no nodules/masses or signs of cancer. Abdominal:     General: Bowel sounds are normal. There is no distension.     Palpations: Abdomen is soft.     Tenderness: There is no abdominal tenderness.     Comments: Rounded, soft  Genitourinary:    Comments: Deferred Musculoskeletal:        General: No swelling. Normal range of motion.     Cervical back: Full passive range of motion  without pain, normal range of motion and neck supple.     Right lower leg: No edema.     Left lower leg: No edema.  Feet:     Right foot:     Protective Sensation: 5 sites tested. 5 sites sensed.     Skin integrity: Dry skin present.     Toenail Condition: Right toenails are normal.     Left foot:     Protective Sensation: 5 sites tested. 5 sites sensed.     Skin integrity: Dry skin present.     Toenail Condition: Left toenails are normal.  Skin:    General: Skin is warm and dry.     Capillary Refill: Capillary refill takes less than 2 seconds.  Neurological:     General: No focal deficit present.     Mental Status: She is alert and oriented to person, place, and time.     Cranial Nerves: No cranial nerve deficit.     Sensory: No sensory deficit.  Psychiatric:        Mood and Affect: Mood normal.        Behavior: Behavior normal.        Thought Content: Thought content normal.        Judgment: Judgment normal.         Assessment And Plan:     1. Routine medical exam Comments: A full exam was performed.  Importance of monthly self breast exams was discussed with the patient. PATIENT IS ADVISED TO GET 30-45 MINUTES REGULAR EXERCISE NO LESS THAN FOUR TO FIVE DAYS PER WEEK - BOTH WEIGHTBEARING EXERCISES AND AEROBIC ARE RECOMMENDED.  PATIENT IS ADVISED TO FOLLOW A HEALTHY DIET WITH AT LEAST SIX FRUITS/VEGGIES PER DAY, DECREASE INTAKE OF RED MEAT, AND TO INCREASE FISH INTAKE TO TWO DAYS PER WEEK.  MEATS/FISH SHOULD NOT BE FRIED, BAKED OR BROILED IS PREFERABLE.  I SUGGEST WEARING SPF 50 SUNSCREEN ON EXPOSED PARTS AND ESPECIALLY WHEN IN THE DIRECT SUNLIGHT  FOR AN EXTENDED PERIOD OF TIME.  PLEASE AVOID FAST FOOD RESTAURANTS AND INCREASE YOUR WATER INTAKE.   2. Parenchymal renal hypertension, stage 1 through stage 4 or unspecified chronic kidney disease Comments: Chronic, fair control. She will continue with current meds. EKG performed, NSR w/o acute changes. She will f/u in six months for  re-evaluation.  - CBC - CMP14+EGFR - Lipid panel - EKG 12-Lead - POCT Urinalysis Dipstick (81002) - POCT UA - Microalbumin  3. Diabetes mellitus with stage 2 chronic kidney disease ([place]) Comments: Diabetic foot exam was performed. She is controlled off of meds. I DISCUSSED WITH THE PATIENT AT LENGTH REGARDING THE GOALS OF GLYCEMIC CONTROL AND POSSIBLE LONG-TERM COMPLICATIONS.  I  ALSO STRESSED THE IMPORTANCE OF COMPLIANCE WITH HOME GLUCOSE MONITORING, DIETARY RESTRICTIONS INCLUDING AVOIDANCE OF SUGARY DRINKS/PROCESSED FOODS,  ALONG WITH REGULAR EXERCISE.  I  ALSO STRESSED THE IMPORTANCE OF ANNUAL EYE EXAMS, SELF FOOT CARE AND COMPLIANCE WITH OFFICE VISITS.  - Hemoglobin A1c  4. Rt flank pain Comments: I will check urinalysis today; however, her sx appear to be musculoskeletal in nature. She is encouraged to perform stretching exercises and let me know if this persists/worsens.   5. Class 1 obesity due to excess calories with serious comorbidity and body mass index (BMI) of 33.0 to 33.9 in adult Comments: She is encouraged to strive for BMI less than 30 to decrease cardiac risk. Advised to aim for at least 150 minutes of exercise per week.   6. Immunization due Comments: She was given high dose flu vaccine.  - Flu Vaccine QUAD High Dose(Fluad)    Patient was given opportunity to ask questions. Patient verbalized understanding of the plan and was able to repeat key elements of the plan. All questions were answered to their satisfaction.   [name], MD   I, [name], MD, have reviewed all documentation for this visit. The documentation on 06/17/20 for the exam, diagnosis, procedures, and orders are all accurate and complete.  THE PATIENT IS ENCOURAGED TO PRACTICE SOCIAL DISTANCING DUE TO THE COVID-19 PANDEMIC.

## 2020-05-30 NOTE — Patient Instructions (Signed)
[name] , Thank you for taking time to come for your Medicare Wellness Visit. I appreciate your ongoing commitment to your health goals. Please review the following plan we discussed and let me know if I can assist you in the future.   Screening recommendations/referrals: Colonoscopy: completed 12/03/2015, due 12/02/2020 Mammogram: scheduled for January Bone Density: completed 11/05/2016 Recommended yearly ophthalmology/optometry visit for glaucoma screening and checkup Recommended yearly dental visit for hygiene and checkup  Vaccinations: Influenza vaccine: today Pneumococcal vaccine: completed 05/12/2019 Tdap vaccine: sent to pharmacy Shingles vaccine: discussed   Covid-19: 11/28/2019, 11/03/2019  Advanced directives: Advance directive discussed with you today. I have provided a copy for you to complete at home and have notarized. Once this is complete please bring a copy in to our office so we can scan it into your chart.  Conditions/risks identified: none  Next appointment: Follow up in one year for your annual wellness visit 06/06/2020 at 8:30   Preventive Care 65 Years and Older, Female Preventive care refers to lifestyle choices and visits with your health care provider that can promote health and wellness. What does preventive care include?  A yearly physical exam. This is also called an annual well check.  Dental exams once or twice a year.  Routine eye exams. Ask your health care provider how often you should have your eyes checked.  Personal lifestyle choices, including:  Daily care of your teeth and gums.  Regular physical activity.  Eating a healthy diet.  Avoiding tobacco and drug use.  Limiting alcohol use.  Practicing safe sex.  Taking low-dose aspirin every day.  Taking vitamin and mineral supplements as recommended by your health care provider. What happens during an annual well check? The services and screenings done by your health care provider  during your annual well check will depend on your age, overall health, lifestyle risk factors, and family history of disease. Counseling  Your health care provider may ask you questions about your:  Alcohol use.  Tobacco use.  Drug use.  Emotional well-being.  Home and relationship well-being.  Sexual activity.  Eating habits.  History of falls.  Memory and ability to understand (cognition).  Work and work Statistician.  Reproductive health. Screening  You may have the following tests or measurements:  Height, weight, and BMI.  Blood pressure.  Lipid and cholesterol levels. These may be checked every 5 years, or more frequently if you are over 38 years old.  Skin check.  Lung cancer screening. You may have this screening every year starting at age 39 if you have a 30-pack-year history of smoking and currently smoke or have quit within the past 15 years.  Fecal occult blood test (FOBT) of the stool. You may have this test every year starting at age 81.  Flexible sigmoidoscopy or colonoscopy. You may have a sigmoidoscopy every 5 years or a colonoscopy every 10 years starting at age 71.  Hepatitis C blood test.  Hepatitis B blood test.  Sexually transmitted disease (STD) testing.  Diabetes screening. This is done by checking your blood sugar (glucose) after you have not eaten for a while (fasting). You may have this done every 1-3 years.  Bone density scan. This is done to screen for osteoporosis. You may have this done starting at age 75.  Mammogram. This may be done every 1-2 years. Talk to your health care provider about how often you should have regular mammograms. Talk with your health care provider about your test results, treatment options,  and if necessary, the need for more tests. Vaccines  Your health care provider may recommend certain vaccines, such as:  Influenza vaccine. This is recommended every year.  Tetanus, diphtheria, and acellular pertussis  (Tdap, Td) vaccine. You may need a Td booster every 10 years.  Zoster vaccine. You may need this after age 9.  Pneumococcal 13-valent conjugate (PCV13) vaccine. One dose is recommended after age 63.  Pneumococcal polysaccharide (PPSV23) vaccine. One dose is recommended after age 50. Talk to your health care provider about which screenings and vaccines you need and how often you need them. This information is not intended to replace advice given to you by your health care provider. Make sure you discuss any questions you have with your health care provider. Document Released: 08/03/2015 Document Revised: 03/26/2016 Document Reviewed: 05/08/2015 Elsevier Interactive Patient Education  2017 [place] Prevention in the Home Falls can cause injuries. They can happen to people of all ages. There are many things you can do to make your home safe and to help prevent falls. What can I do on the outside of my home?  Regularly fix the edges of walkways and driveways and fix any cracks.  Remove anything that might make you trip as you walk through a door, such as a raised step or threshold.  Trim any bushes or trees on the path to your home.  Use bright outdoor lighting.  Clear any walking paths of anything that might make someone trip, such as rocks or tools.  Regularly check to see if handrails are loose or broken. Make sure that both sides of any steps have handrails.  Any raised decks and porches should have guardrails on the edges.  Have any leaves, snow, or ice cleared regularly.  Use sand or salt on walking paths during winter.  Clean up any spills in your garage right away. This includes oil or grease spills. What can I do in the bathroom?  Use night lights.  Install grab bars by the toilet and in the tub and shower. Do not use towel bars as grab bars.  Use non-skid mats or decals in the tub or shower.  If you need to sit down in the shower, use a plastic, non-slip  stool.  Keep the floor dry. Clean up any water that spills on the floor as soon as it happens.  Remove soap buildup in the tub or shower regularly.  Attach bath mats securely with double-sided non-slip rug tape.  Do not have throw rugs and other things on the floor that can make you trip. What can I do in the bedroom?  Use night lights.  Make sure that you have a light by your bed that is easy to reach.  Do not use any sheets or blankets that are too big for your bed. They should not hang down onto the floor.  Have a firm chair that has side arms. You can use this for support while you get dressed.  Do not have throw rugs and other things on the floor that can make you trip. What can I do in the kitchen?  Clean up any spills right away.  Avoid walking on wet floors.  Keep items that you use a lot in easy-to-reach places.  If you need to reach something above you, use a strong step stool that has a grab bar.  Keep electrical cords out of the way.  Do not use floor polish or wax that makes floors slippery. If  you must use wax, use non-skid floor wax.  Do not have throw rugs and other things on the floor that can make you trip. What can I do with my stairs?  Do not leave any items on the stairs.  Make sure that there are handrails on both sides of the stairs and use them. Fix handrails that are broken or loose. Make sure that handrails are as long as the stairways.  Check any carpeting to make sure that it is firmly attached to the stairs. Fix any carpet that is loose or worn.  Avoid having throw rugs at the top or bottom of the stairs. If you do have throw rugs, attach them to the floor with carpet tape.  Make sure that you have a light switch at the top of the stairs and the bottom of the stairs. If you do not have them, ask someone to add them for you. What else can I do to help prevent falls?  Wear shoes that:  Do not have high heels.  Have rubber bottoms.  Are  comfortable and fit you well.  Are closed at the toe. Do not wear sandals.  If you use a stepladder:  Make sure that it is fully opened. Do not climb a closed stepladder.  Make sure that both sides of the stepladder are locked into place.  Ask someone to hold it for you, if possible.  Clearly mark and make sure that you can see:  Any grab bars or handrails.  First and last steps.  Where the edge of each step is.  Use tools that help you move around (mobility aids) if they are needed. These include:  Canes.  Walkers.  Scooters.  Crutches.  Turn on the lights when you go into a dark area. Replace any light bulbs as soon as they burn out.  Set up your furniture so you have a clear path. Avoid moving your furniture around.  If any of your floors are uneven, fix them.  If there are any pets around you, be aware of where they are.  Review your medicines with your doctor. Some medicines can make you feel dizzy. This can increase your chance of falling. Ask your doctor what other things that you can do to help prevent falls. This information is not intended to replace advice given to you by your health care provider. Make sure you discuss any questions you have with your health care provider. Document Released: 05/03/2009 Document Revised: 12/13/2015 Document Reviewed: 08/11/2014 Elsevier Interactive Patient Education  2017 [employer].

## 2020-05-30 NOTE — Patient Instructions (Addendum)
Lidocaine patch  Health Maintenance, Female Adopting a healthy lifestyle and getting preventive care are important in promoting health and wellness. Ask your health care provider about:  The right schedule for you to have regular tests and exams.  Things you can do on your own to prevent diseases and keep yourself healthy. What should I know about diet, weight, and exercise? Eat a healthy diet   Eat a diet that includes plenty of vegetables, fruits, low-fat dairy products, and lean protein.  Do not eat a lot of foods that are high in solid fats, added sugars, or sodium. Maintain a healthy weight Body mass index (BMI) is used to identify weight problems. It estimates body fat based on height and weight. Your health care provider can help determine your BMI and help you achieve or maintain a healthy weight. Get regular exercise Get regular exercise. This is one of the most important things you can do for your health. Most adults should:  Exercise for at least 150 minutes each week. The exercise should increase your heart rate and make you sweat (moderate-intensity exercise).  Do strengthening exercises at least twice a week. This is in addition to the moderate-intensity exercise.  Spend less time sitting. Even light physical activity can be beneficial. Watch cholesterol and blood lipids Have your blood tested for lipids and cholesterol at 71 years of age, then have this test every 5 years. Have your cholesterol levels checked more often if:  Your lipid or cholesterol levels are high.  You are older than 71 years of age.  You are at high risk for heart disease. What should I know about cancer screening? Depending on your health history and family history, you may need to have cancer screening at various ages. This may include screening for:  Breast cancer.  Cervical cancer.  Colorectal cancer.  Skin cancer.  Lung cancer. What should I know about heart disease, diabetes, and  high blood pressure? Blood pressure and heart disease  High blood pressure causes heart disease and increases the risk of stroke. This is more likely to develop in people who have high blood pressure readings, are of African descent, or are overweight.  Have your blood pressure checked: ? Every 3-5 years if you are 5-42 years of age. ? Every year if you are 44 years old or older. Diabetes Have regular diabetes screenings. This checks your fasting blood sugar level. Have the screening done:  Once every three years after age 28 if you are at a normal weight and have a low risk for diabetes.  More often and at a younger age if you are overweight or have a high risk for diabetes. What should I know about preventing infection? Hepatitis B If you have a higher risk for hepatitis B, you should be screened for this virus. Talk with your health care provider to find out if you are at risk for hepatitis B infection. Hepatitis C Testing is recommended for:  Everyone born from [birth year] through 1967.  Anyone with known risk factors for hepatitis C. Sexually transmitted infections (STIs)  Get screened for STIs, including gonorrhea and chlamydia, if: ? You are sexually active and are younger than 71 years of age. ? You are older than 71 years of age and your health care provider tells you that you are at risk for this type of infection. ? Your sexual activity has changed since you were last screened, and you are at increased risk for chlamydia or gonorrhea. Ask your health  care provider if you are at risk.  Ask your health care provider about whether you are at high risk for HIV. Your health care provider may recommend a prescription medicine to help prevent HIV infection. If you choose to take medicine to prevent HIV, you should first get tested for HIV. You should then be tested every 3 months for as long as you are taking the medicine. Pregnancy  If you are about to stop having your period  (premenopausal) and you may become pregnant, seek counseling before you get pregnant.  Take 400 to 800 micrograms (mcg) of folic acid every day if you become pregnant.  Ask for birth control (contraception) if you want to prevent pregnancy. Osteoporosis and menopause Osteoporosis is a disease in which the bones lose minerals and strength with aging. This can result in bone fractures. If you are 24 years old or older, or if you are at risk for osteoporosis and fractures, ask your health care provider if you should:  Be screened for bone loss.  Take a calcium or vitamin D supplement to lower your risk of fractures.  Be given hormone replacement therapy (HRT) to treat symptoms of menopause. Follow these instructions at home: Lifestyle  Do not use any products that contain nicotine or tobacco, such as cigarettes, e-cigarettes, and chewing tobacco. If you need help quitting, ask your health care provider.  Do not use street drugs.  Do not share needles.  Ask your health care provider for help if you need support or information about quitting drugs. Alcohol use  Do not drink alcohol if: ? Your health care provider tells you not to drink. ? You are pregnant, may be pregnant, or are planning to become pregnant.  If you drink alcohol: ? Limit how much you use to 0-1 drink a day. ? Limit intake if you are breastfeeding.  Be aware of how much alcohol is in your drink. In the [place] one drink equals one 12 oz bottle of beer (355 mL), one 5 oz glass of wine (148 mL), or one 1 oz glass of hard liquor (44 mL). General instructions  Schedule regular health, dental, and eye exams.  Stay current with your vaccines.  Tell your health care provider if: ? You often feel depressed. ? You have ever been abused or do not feel safe at home. Summary  Adopting a healthy lifestyle and getting preventive care are important in promoting health and wellness.  Follow your health care provider's  instructions about healthy diet, exercising, and getting tested or screened for diseases.  Follow your health care provider's instructions on monitoring your cholesterol and blood pressure. This information is not intended to replace advice given to you by your health care provider. Make sure you discuss any questions you have with your health care provider. Document Revised: 06/30/2018 Document Reviewed: 06/30/2018 Elsevier Patient Education  2020 [employer].

## 2020-05-31 ENCOUNTER — Telehealth: Payer: Self-pay

## 2020-05-31 DIAGNOSIS — G4733 Obstructive sleep apnea (adult) (pediatric): Secondary | ICD-10-CM | POA: Diagnosis not present

## 2020-05-31 LAB — CMP14+EGFR
ALT: 17 IU/L (ref 0–32)
AST: 16 IU/L (ref 0–40)
Albumin/Globulin Ratio: 1.4 (ref 1.2–2.2)
Albumin: 4.4 g/dL (ref 3.7–4.7)
Alkaline Phosphatase: 85 IU/L (ref 44–121)
BUN/Creatinine Ratio: 17 (ref 12–28)
BUN: 17 mg/dL (ref 8–27)
Bilirubin Total: 0.5 mg/dL (ref 0.0–1.2)
CO2: 23 mmol/L (ref 20–29)
Calcium: 9.8 mg/dL (ref 8.7–10.3)
Chloride: 101 mmol/L (ref 96–106)
Creatinine, Ser: 1.01 mg/dL — ABNORMAL HIGH (ref 0.57–1.00)
GFR calc Af Amer: 65 mL/min/{1.73_m2} (ref >59)
GFR calc non Af Amer: 56 mL/min/{1.73_m2} — ABNORMAL LOW (ref >59)
Globulin, Total: 3.1 g/dL (ref 1.5–4.5)
Glucose: 120 mg/dL — ABNORMAL HIGH (ref 65–99)
Potassium: 3.4 mmol/L — ABNORMAL LOW (ref 3.5–5.2)
Sodium: 139 mmol/L (ref 134–144)
Total Protein: 7.5 g/dL (ref 6.0–8.5)

## 2020-05-31 LAB — CBC
Hematocrit: 36.2 % (ref 34.0–46.6)
Hemoglobin: 12 g/dL (ref 11.1–15.9)
MCH: 32.3 pg (ref 26.6–33.0)
MCHC: 33.1 g/dL (ref 31.5–35.7)
MCV: 97 fL (ref 79–97)
Platelets: 209 10*3/uL (ref 150–450)
RBC: 3.72 x10E6/uL — ABNORMAL LOW (ref 3.77–5.28)
RDW: 10.8 % — ABNORMAL LOW (ref 11.7–15.4)
WBC: 5.8 10*3/uL (ref 3.4–10.8)

## 2020-05-31 LAB — HEMOGLOBIN A1C
Est. average glucose Bld gHb Est-mCnc: 126 mg/dL
Hgb A1c MFr Bld: 6 % — ABNORMAL HIGH (ref 4.8–5.6)

## 2020-05-31 LAB — LIPID PANEL
Chol/HDL Ratio: 2.7 ratio (ref 0.0–4.4)
Cholesterol, Total: 170 mg/dL (ref 100–199)
HDL: 64 mg/dL (ref >39)
LDL Chol Calc (NIH): 92 mg/dL (ref 0–99)
Triglycerides: 73 mg/dL (ref 0–149)
VLDL Cholesterol Cal: 14 mg/dL (ref 5–40)

## 2020-05-31 NOTE — Telephone Encounter (Signed)
The pt called because she said she was looking at her test results in her mychart.  The pt was told that they pts get's the results before the provider can interpret them and that she would get the another message about her results once they are reviewed.  The pt said that it did indicate that and she was just concerned about her kidney function.

## 2020-06-06 DIAGNOSIS — E119 Type 2 diabetes mellitus without complications: Secondary | ICD-10-CM | POA: Diagnosis not present

## 2020-06-06 DIAGNOSIS — H16421 Pannus (corneal), right eye: Secondary | ICD-10-CM | POA: Diagnosis not present

## 2020-06-06 DIAGNOSIS — H2513 Age-related nuclear cataract, bilateral: Secondary | ICD-10-CM | POA: Diagnosis not present

## 2020-06-06 LAB — HM DIABETES EYE EXAM

## 2020-06-11 DIAGNOSIS — H2513 Age-related nuclear cataract, bilateral: Secondary | ICD-10-CM | POA: Diagnosis not present

## 2020-06-11 DIAGNOSIS — I10 Essential (primary) hypertension: Secondary | ICD-10-CM | POA: Diagnosis not present

## 2020-06-11 DIAGNOSIS — H2511 Age-related nuclear cataract, right eye: Secondary | ICD-10-CM | POA: Diagnosis not present

## 2020-06-12 DIAGNOSIS — Z20822 Contact with and (suspected) exposure to covid-19: Secondary | ICD-10-CM | POA: Diagnosis not present

## 2020-06-25 DIAGNOSIS — Z20822 Contact with and (suspected) exposure to covid-19: Secondary | ICD-10-CM | POA: Diagnosis not present

## 2020-06-30 DIAGNOSIS — G4733 Obstructive sleep apnea (adult) (pediatric): Secondary | ICD-10-CM | POA: Diagnosis not present

## 2020-07-02 DIAGNOSIS — Z20822 Contact with and (suspected) exposure to covid-19: Secondary | ICD-10-CM | POA: Diagnosis not present

## 2020-07-03 ENCOUNTER — Ambulatory Visit: Payer: Self-pay

## 2020-07-03 DIAGNOSIS — E1122 Type 2 diabetes mellitus with diabetic chronic kidney disease: Secondary | ICD-10-CM

## 2020-07-03 DIAGNOSIS — E7800 Pure hypercholesterolemia, unspecified: Secondary | ICD-10-CM

## 2020-07-03 NOTE — Chronic Care Management (AMB) (Signed)
Chronic Care Management Pharmacy  Name: Martha Pham  MRN: [id number] DOB: [birth year]  Chief Complaint/ HPI  Martha Pham,  69 y.o. , female presents for their Follow-Up CCM visit with the clinical pharmacist via telephone due to COVID-19 Pandemic. She has a Martha daughter who is [place] and a Martha son who is [place] and a [place] year old Martha son who is in [place]. She has seven bonus grandchildren who are in [place]. She comes from a very large family and loves her family. Her daughter is in [place]. She has a busy routine.   PCP : Glendale Chard, MD  Their chronic conditions include: Diabetes mellitus, Stage 2 CKD, Hypertensive nephropathy  Office Visits: 12/05/19 Nurse visit: Presented for 2 week BP check. BP was 128/70.   11/21/19 OV: HTN/ HLD/ DM follow up. Pt admits not exercising regularly. Chronic, well controlled HTN, but BP was elevated at specialist visit. Return in 2 week for nurse visit. Encouraged to purchase home BP monitor. Labs ordered (CMP14+EGFR, HgbA1c). No meds currently for DM. Encouraged to exercise 150 minutes weekly. Continue current meds.   Consult Visits: 12/12/19 Plastic surgery OV w/ Dr. Claudia Desanctis: Presented for evaluation of keloid scar on right chest from where a chemotherapy port was removed years ago. New pigmented lesion also present on central chest that came up in last 6 months. Plan for steroid injection into keloid and punch biopsy of pigmented lesion when patient returns from [place]. Discussed potential complications.   12/05/19 Oncology telephone call: Pt notified that chest, abdomen, and pelvis CT scans were clear of cancer  11/30/19 Neurology sleep study: PSG confirmed mild OSA, but severe during REM/dream sleep with low O2 of 77%. Recommend treatment with autoPAP. AutoPAP to be supplied through [facility] who will also do training on use. F/U in 10 weeks post-PAP set up.   11/18/19 Oncology OV: Presented to survivorship clinic for follow up for history of breast  cancer (Stage IIA left breast invasive ductal carcinoma, ER-/PR-/HER2- diagnosed in 05/2011). Complaint of right rib pain and itching. Xray negative. Chest, abdomen, and pelvis CT ordered to evaluated right upper quadrant pain. Referred to plastic surgery for keloid formation as scars from surgery. Educated on bone health and cancer screenings.   11/02/19 Neurology OV w/ Dr. Rexene Alberts: Pt reported RLS under reasonable control with prn low-dose gabapentin at night. Increased snoring lately and daytime somnolence. Sleep study from 2016 did not show significant sleep apnea except for REM related sleep apnea. Sleep study ordered to re-evaluate sleep.  CCM Encounters:  Medications: Outpatient Encounter Medications as of 07/03/2020  Medication Sig  . acetaminophen (TYLENOL) 500 MG tablet Take 1,000 mg by mouth every 6 (six) hours as needed for moderate pain.   . Ascorbic Acid (VITAMIN C) 1000 MG tablet Take 1,000 mg by mouth daily.  Marland Kitchen atorvastatin (LIPITOR) 20 MG tablet Take 1 tablet (20 mg total) by mouth daily.  . calcium carbonate (OS-CAL) 600 MG TABS Take 600 mg by mouth 2 (two) times daily with a meal.    . Carboxymethylcellulose Sodium (REFRESH OP) Apply 1 drop to eye 2 (two) times daily as needed. For dry eyes  . Cyanocobalamin (VITAMIN B-12) 2500 MCG SUBL Place 1 tablet under the tongue daily.  . fish oil-omega-3 fatty acids 1000 MG capsule Take 2 g by mouth every evening.   . gabapentin (NEURONTIN) 100 MG capsule Take 1 pill at bedtime as needed for leg pain.  . hydrochlorothiazide (HYDRODIURIL) 25 MG tablet Take  1 tablet (25 mg total) by mouth daily.  Marland Kitchen losartan (COZAAR) 100 MG tablet Take 1 tablet (100 mg total) by mouth daily.  . Magnesium 400 MG CAPS Take by mouth daily.   . metoprolol succinate (TOPROL-XL) 25 MG 24 hr tablet Take 1 tablet (25 mg total) by mouth daily.  . Vitamin D, Cholecalciferol, 50 MCG (2000 UT) CAPS Take by mouth.    No facility-administered encounter medications on  file as of 07/03/2020.    Current Diagnosis/Assessment:    Goals Addressed            This Visit's Progress   . Pharmacy Care Plan       CARE PLAN ENTRY (see longitudinal plan of care for additional care plan information)  Current Barriers:  . Chronic Disease Management support, education, and care coordination needs related to Hypertension, Hyperlipidemia, Diabetes, and Osteopenia/Osteoporosis Screening   Hypertension BP Readings from Last 3 Encounters:  12/12/19 (!) 147/72  12/05/19 128/70  11/21/19 118/66   . Pharmacist Clinical Goal(s): o Over the next 180 days, patient will work with PharmD and providers to maintain BP goal <140/90 . Current regimen:   Hydrochlorothiazide 22m daily  Losartan 1084mdaily  Metoprolol succinate 2516maily . Interventions: o Provided dietary and exercise recommendations . Patient self care activities - Over the next 180 days, patient will: o Check BP weekly and if symptomatic, document, and provide at future appointments o Ensure daily salt intake < 2300 mg/day o Exercise for 30 minutes daily 5 times per week (150 minutes total per week)  Hyperlipidemia Lab Results  Component Value Date/Time   LDLCALC 96 05/12/2019 10:04 AM   . Pharmacist Clinical Goal(s): o Over the next 90 days, patient will work with PharmD and providers to achieve LDL goal < 70 . Current regimen:  . Atorvastatin 61m15mily . Omega-3 fatty acids 2000mg57mapsules every evening . Interventions: o Provided dietary and exercise recommendations o Patient complained of size and smell of omega 3 capsules - Advised patient she could try odorless fish oil or try krill oil (smaller capsules) . Patient self care activities - Over the next 90 days, patient will: o Exercise for 30 minutes daily 5 times per week (150 minutes total per week) o Try odorless fish oil or krill oil  Prediabetes Lab Results  Component Value Date/Time   HGBA1C 5.7 (H) 11/21/2019 04:10  PM   HGBA1C 5.6 05/12/2019 10:04 AM   . Pharmacist Clinical Goal(s): o Over the next 180 days, patient will work with PharmD and providers to maintain A1c goal <7% . Current regimen:  o N/A . Interventions: o Provided dietary and exercise recommendations o Recommend patient increase water intake to 64 ounces daily o Discussed importance of diet and exercise to lower A1c back to normal range . Patient self care activities - Over the next 180 days, patient will: o Check blood sugar occasionally, document, and provide at future appointments o Contact provider with any episodes of hypoglycemia o Exercise for 30 minutes daily 5 times per week (150 minutes total per week)  Osteopenia/Osteoporosis Screening . Pharmacist Clinical Goal(s) o Over the next 180 days, patient will work with PharmD and providers to screen patient for osteopenia/osteoporosis and supplement appropriately . Current regimen:  o N/A . Interventions: o Recommend DEXA scan (last order expired) o Recommend Vitamin D level o Recommend Vitamin D 2000 units daily and 1200mg 72mium daily o Recommend weight-bearing exercises 3 times weekly to build and maintain bone density .  Patient self care activities - Over the next 180 days, patient will: o Have bone density scan performed o Start taking Vitamin D3 2000 units daily and calcium 1227m daily o Start weight-bearing exercises three times weekly  Medication management . Pharmacist Clinical Goal(s): o Over the next 180 days, patient will work with PharmD and providers to maintain optimal medication adherence . Current pharmacy: [facility] . Interventions o Comprehensive medication review performed. o Continue current medication management strategy . Patient self care activities - Over the next 180 days, patient will: o Focus on medication adherence by continued use of pill box o Take medications as prescribed o Report any questions or concerns to PharmD and/or  provider(s)          Pre-diabetes   Recent Relevant Labs: Lab Results  Component Value Date/Time   HGBA1C 6.0 (H) 05/30/2020 10:12 AM   HGBA1C 5.7 (H) 11/21/2019 04:10 PM   EGFR 86 (L) 04/03/2015 11:09 AM   MICROALBUR 10 05/30/2020 10:12 AM    Checking BG: Rarely, she reports that she is not checking her BS at home  Recent FBG Readings: Recent pre-meal BG readings:  Recent 2hr PP BG readings:   Recent HS BG readings:  Patient has failed these meds in past: N/A Patient is currently controlled on the following medications: N/A  Last diabetic Foot exam: Not on file Last diabetic Eye exam:  Lab Results  Component Value Date/Time   HMDIABEYEEXA No Retinopathy 05/20/2018 12:00 AM    We discussed:  . Diet extensively o Tries to get 3 servings of vegetables a day and 2 servings of fruit o Loves to eat salads o Uses Stevia o Not drinking enough water daily (32 ounces) - Recommend 64 ounces of water daily o PLATE method . Exercise extensively o Before pandemic was going to the [employer] 4 days per week and do water aerobics o Doing line dancing at [employer](twice per week) o Walking some o Goal is to exercise 30 minutes daily 5 times weekly - This amount of exercise can lower A1c up to 0.7 . Importance of diet and exercise to lower HgbA1c back to regular range . Check blood sugar twice per day before eating and 2 hours after a meal.  Plan Continue control with diet and exercise    Hypertension   Office blood pressures are  BP Readings from Last 3 Encounters:  05/30/20 134/82  05/30/20 134/82  05/08/20 (!) 152/78   Patient has failed these meds in the past: Telmisartan/HCTZ Patient is currently controlled on the following medications:   Hydrochlorothiazide 268mdaily  Losartan 10051maily  Metoprolol succinate 33m15mily  Patient checks BP at home several times per month  Patient home BP readings are ranging: None to provide today  We discussed: . Diet and  exercise extensively o Patient lost 30 lbs a couple of years ago o Would like to 30 lbs  o Exercise 30 minutes three times per week - Using your computer - Using TV  - Silver sneakers exercise on youtube  . Goal BP <140/90 . Has had an episode of lightheadedness/dizziness in the past week, but not typical   Plan Continue current medications    Hyperlipidemia   LDL goal < 70  Lipid Panel     Component Value Date/Time   CHOL 170 05/30/2020 1012   TRIG 73 05/30/2020 1012   HDL 64 05/30/2020 1012   LDLCALC 92 05/30/2020 1012    Hepatic Function Latest Ref Rng &  Units 05/30/2020 11/21/2019 05/12/2019  Total Protein 6.0 - 8.5 g/dL 7.5 7.3 7.5  Albumin 3.7 - 4.7 g/dL 4.4 4.6 4.4  AST 0 - 40 IU/L 16 14 20   ALT 0 - 32 IU/L 17 13 18   Alk Phosphatase 44 - 121 IU/L 85 87 91  Total Bilirubin 0.0 - 1.2 mg/dL 0.5 0.6 0.5    The 10-year ASCVD risk score Mikey Bussing DC Jr., et al., 2013) is: 26.6%   Values used to calculate the score:     Age: 5 years     Sex: Female     Is Non-Hispanic African American: Yes     Diabetic: Yes     Tobacco smoker: No     Systolic Blood Pressure: 314 mmHg     Is BP treated: Yes     HDL Cholesterol: 64 mg/dL     Total Cholesterol: 170 mg/dL   Patient has failed these meds in past: N/A Patient is currently uncontrolled on the following medications:  . Atorvastatin 16m daily . Omega-3 fatty acids 20050m2 capsules every evening  We discussed:   . Diet and exercise extensively o Only eats red meat every once in a while  Pt says omega capsules are large and don't smell good  Advised her she could try an odorless kind or try krill oil  Plan Continue current medications  Restless Leg Syndrome   Patient has failed these meds in past: N/A Patient is currently controlled on the following medications:   Gabapentin 10045mt bedtime as needed  Plan Continue current medications  Osteopenia / Osteoporosis   Last DEXA Scan: Order expired 11/28/19  Vit  D, 25-Hydroxy  Date Value Ref Range Status  09/12/2013 56 30 - 89 ng/mL Final    Comment:    This assay accurately quantifies Vitamin D, which is the sum of the25-Hydroxy forms of Vitamin D2 and D3.  Studies have shown that theoptimum concentration of 25-Hydroxy Vitamin D is 30 ng/mL or higher. Concentrations of Vitamin D between 20 and 29 ng/mL  are considered tobe insufficient and concentrations less than 20 ng/mL are consideredto be deficient for Vitamin D.    Patient has failed these meds in past: N/A Patient is currently uncontrolled on the following medications:  . Not taking any supplements at this; has been on calcium and Vitamin D in the past   We discussed:  Recommend 1200 mg of calcium daily from dietary and supplemental sources. Recommend weight-bearing and muscle strengthening exercises for building and maintaining bone density.   Pt says she has not had a DEXA scan in past 2 years   Recommend Cholecalciferol 2000 units daily  Plan Continue control with diet and exercise  Recommend Vitamin D level at next office visit Recommend DEXA scan  Chronic Kidney Disease Stage 2   Patient has failed these meds in past:  None noted  Patient is currently controlled on the following medications:  . Losartan 100 mg -take daily   We discussed:     Will discuss at next office visit.   Plan  Continue current medications   Health Maintenance   Patient is currently on the following medications:  . VMarland Kitchentamin B12 2500m17maily . Vitamin C 1000mg71mly  We discussed:    Pt happy with current over the counter medications  Plan Continue current medications  Vaccines   Reviewed and discussed patient's vaccination history.    Immunization History  Administered Date(s) Administered  . Fluad Quad(high Dose 65+) 05/30/2020  .  Influenza, High Dose Seasonal PF 05/05/2018, 04/14/2019  . Influenza,inj,Quad PF,6+ Mos 04/04/2014  . PFIZER SARS-COV-2 Vaccination 11/03/2019,  11/28/2019  . Pneumococcal Polysaccharide-23 05/12/2019   Plan Discuss at follow up visit  Medication Management   Pt uses [facility] for all medications Uses pill box? Yes Pt endorses 100% compliance  We discussed:   Importance of medication adherence  Plan Continue current medication management strategy  Will discuss medication synchronization and adherence packaging at follow up visit  Follow up: 3 month phone visit  [name], PharmD Clinical Pharmacist [facility] [phone]

## 2020-07-18 ENCOUNTER — Telehealth: Payer: Self-pay

## 2020-07-18 NOTE — Patient Instructions (Signed)
Visit Information  Goals Addressed            This Visit's Progress   . Pharmacy Care Plan       CARE PLAN ENTRY (see longitudinal plan of care for additional care plan information)  Current Barriers:  . Chronic Disease Management support, education, and care coordination needs related to Hypertension, Hyperlipidemia, Diabetes, and Osteopenia/Osteoporosis Screening   Hypertension BP Readings from Last 3 Encounters:  12/12/19 (!) 147/72  12/05/19 128/70  11/21/19 118/66   . Pharmacist Clinical Goal(s): o Over the next 180 days, patient will work with PharmD and providers to maintain BP goal <140/90 . Current regimen:   Hydrochlorothiazide 25mg  daily  Losartan 100mg  daily  Metoprolol succinate 25mg  daily . Interventions: o Provided dietary and exercise recommendations . Patient self care activities - Over the next 180 days, patient will: o Check BP weekly and if symptomatic, document, and provide at future appointments o Ensure daily salt intake < 2300 mg/day o Exercise for 30 minutes daily 5 times per week (150 minutes total per week)  Hyperlipidemia Lab Results  Component Value Date/Time   LDLCALC 96 05/12/2019 10:04 AM   . Pharmacist Clinical Goal(s): o Over the next 90 days, patient will work with PharmD and providers to achieve LDL goal < 70 . Current regimen:  . Atorvastatin 20mg  daily . Omega-3 fatty acids 2000mg  2 capsules every evening . Interventions: o Provided dietary and exercise recommendations o Patient complained of size and smell of omega 3 capsules - Advised patient she could try odorless fish oil or try krill oil (smaller capsules) . Patient self care activities - Over the next 90 days, patient will: o Exercise for 30 minutes daily 5 times per week (150 minutes total per week) o Try odorless fish oil or krill oil  Prediabetes Lab Results  Component Value Date/Time   HGBA1C 5.7 (H) 11/21/2019 04:10 PM   HGBA1C 5.6 05/12/2019 10:04 AM    . Pharmacist Clinical Goal(s): o Over the next 180 days, patient will work with PharmD and providers to maintain A1c goal <7% . Current regimen:  o N/A . Interventions: o Provided dietary and exercise recommendations o Recommend patient increase water intake to 64 ounces daily o Discussed importance of diet and exercise to lower A1c back to normal range . Patient self care activities - Over the next 180 days, patient will: o Check blood sugar occasionally, document, and provide at future appointments o Contact provider with any episodes of hypoglycemia o Exercise for 30 minutes daily 5 times per week (150 minutes total per week)  Osteopenia/Osteoporosis Screening . Pharmacist Clinical Goal(s) o Over the next 180 days, patient will work with PharmD and providers to screen patient for osteopenia/osteoporosis and supplement appropriately . Current regimen:  o N/A . Interventions: o Recommend DEXA scan (last order expired) o Recommend Vitamin D level o Recommend Vitamin D 2000 units daily and 1200mg  calcium daily o Recommend weight-bearing exercises 3 times weekly to build and maintain bone density . Patient self care activities - Over the next 180 days, patient will: o Have bone density scan performed o Start taking Vitamin D3 2000 units daily and calcium 1200mg  daily o Start weight-bearing exercises three times weekly  Medication management . Pharmacist Clinical Goal(s): o Over the next 180 days, patient will work with PharmD and providers to maintain optimal medication adherence . Current pharmacy: [facility] . Interventions o Comprehensive medication review performed. o Continue current medication management strategy . Patient self care activities -  Over the next 180 days, patient will: o Focus on medication adherence by continued use of pill box o Take medications as prescribed o Report any questions or concerns to PharmD and/or provider(s)          The patient  verbalized understanding of instructions, educational materials, and care plan provided today and declined offer to receive copy of patient instructions, educational materials, and care plan.   The pharmacy team will reach out to the patient again over the next 30 days.   [name], PharmD Clinical Pharmacist [facility] [phone]

## 2020-07-18 NOTE — Chronic Care Management (AMB) (Signed)
    Chronic Care Management Pharmacy Assistant   Name: Martha Pham  MRN: [id number] DOB: [birth year]  Reason for Encounter: Medication Review  .  PCP : Glendale Chard, MD  Allergies:   Allergies  Allergen Reactions  . Dust Mite Extract Itching  . Other     grass    Medications: Outpatient Encounter Medications as of 07/18/2020  Medication Sig  . acetaminophen (TYLENOL) 500 MG tablet Take 1,000 mg by mouth every 6 (six) hours as needed for moderate pain.   . Ascorbic Acid (VITAMIN C) 1000 MG tablet Take 1,000 mg by mouth daily.  Marland Kitchen atorvastatin (LIPITOR) 20 MG tablet Take 1 tablet (20 mg total) by mouth daily.  . calcium carbonate (OS-CAL) 600 MG TABS Take 600 mg by mouth 2 (two) times daily with a meal.    . Carboxymethylcellulose Sodium (REFRESH OP) Apply 1 drop to eye 2 (two) times daily as needed. For dry eyes  . Cyanocobalamin (VITAMIN B-12) 2500 MCG SUBL Place 1 tablet under the tongue daily.  . fish oil-omega-3 fatty acids 1000 MG capsule Take 2 g by mouth every evening.   . gabapentin (NEURONTIN) 100 MG capsule Take 1 pill at bedtime as needed for leg pain.  . hydrochlorothiazide (HYDRODIURIL) 25 MG tablet Take 1 tablet (25 mg total) by mouth daily.  Marland Kitchen losartan (COZAAR) 100 MG tablet Take 1 tablet (100 mg total) by mouth daily.  . Magnesium 400 MG CAPS Take by mouth daily.   . metoprolol succinate (TOPROL-XL) 25 MG 24 hr tablet Take 1 tablet (25 mg total) by mouth daily.  . Vitamin D, Cholecalciferol, 50 MCG (2000 UT) CAPS Take by mouth.    No facility-administered encounter medications on file as of 07/18/2020.    Current Diagnosis: Patient Active Problem List   Diagnosis Date Noted  . Diabetes mellitus with stage 2 chronic kidney disease ([place]) 06/22/2018  . Chronic renal disease, stage II 06/22/2018  . Hypertensive nephropathy 06/22/2018  . Adult BMI 29.0-29.9 kg/sq m 06/22/2018  . Pain in joint, pelvic region and thigh 03/28/2014  . Acquired absence of breast  and absent nipple 05/05/2012  . Breast cancer of upper-inner quadrant of left female breast ([place]) 06/12/2011      Follow-Up:  Pharmacist Review - Reviewed chart and adherence measures. Per Insurance data medication adherence for hypertension 100 % med compliance.  [name], CPP . Notified   Judithann Sheen, [place] Pharmacist Assistant [phone]

## 2020-07-24 DIAGNOSIS — G4733 Obstructive sleep apnea (adult) (pediatric): Secondary | ICD-10-CM | POA: Diagnosis not present

## 2020-07-26 ENCOUNTER — Ambulatory Visit: Payer: PPO

## 2020-07-27 DIAGNOSIS — U071 COVID-19: Secondary | ICD-10-CM | POA: Diagnosis not present

## 2020-07-31 DIAGNOSIS — G4733 Obstructive sleep apnea (adult) (pediatric): Secondary | ICD-10-CM | POA: Diagnosis not present

## 2020-08-09 DIAGNOSIS — H2511 Age-related nuclear cataract, right eye: Secondary | ICD-10-CM | POA: Diagnosis not present

## 2020-08-10 DIAGNOSIS — H2512 Age-related nuclear cataract, left eye: Secondary | ICD-10-CM | POA: Diagnosis not present

## 2020-08-22 ENCOUNTER — Other Ambulatory Visit: Payer: Self-pay

## 2020-08-22 ENCOUNTER — Ambulatory Visit
Admission: RE | Admit: 2020-08-22 | Discharge: 2020-08-22 | Disposition: A | Discharge status: Home / Self Care | Payer: PPO | Source: Ambulatory Visit | Attending: Internal Medicine | Admitting: Internal Medicine

## 2020-08-22 DIAGNOSIS — Z1231 Encounter for screening mammogram for malignant neoplasm of breast: Secondary | ICD-10-CM | POA: Diagnosis not present

## 2020-08-23 DIAGNOSIS — H2512 Age-related nuclear cataract, left eye: Secondary | ICD-10-CM | POA: Diagnosis not present

## 2020-08-29 ENCOUNTER — Other Ambulatory Visit: Payer: Self-pay

## 2020-08-29 NOTE — Telephone Encounter (Signed)
The pt will get the name of her glucometer and send it in mychart so that a prescription for supplies can be faxed to the pharmacy.  The pt was notified that her insurance may not cover because the pt isn't on any insulin.

## 2020-08-31 DIAGNOSIS — G4733 Obstructive sleep apnea (adult) (pediatric): Secondary | ICD-10-CM | POA: Diagnosis not present

## 2020-09-06 ENCOUNTER — Telehealth: Payer: Self-pay

## 2020-09-06 NOTE — Chronic Care Management (AMB) (Signed)
Chronic Care Management Pharmacy Assistant   Name: Martha Pham  MRN: [id number] DOB: 11/03/1950  Reason for Encounter: Diabetes Adherence Call  Patient Questions:  1.  Have you seen any other providers since your last visit? No    2.  Any changes in your medicines or health? No    PCP : Glendale Chard, MD  Allergies:   Allergies  Allergen Reactions  . Dust Mite Extract Itching  . Other     grass    Medications: Outpatient Encounter Medications as of 09/06/2020  Medication Sig  . acetaminophen (TYLENOL) 500 MG tablet Take 1,000 mg by mouth every 6 (six) hours as needed for moderate pain.   . Ascorbic Acid (VITAMIN C) 1000 MG tablet Take 1,000 mg by mouth daily.  Marland Kitchen atorvastatin (LIPITOR) 20 MG tablet Take 1 tablet (20 mg total) by mouth daily.  . calcium carbonate (OS-CAL) 600 MG TABS Take 600 mg by mouth 2 (two) times daily with a meal.    . Carboxymethylcellulose Sodium (REFRESH OP) Apply 1 drop to eye 2 (two) times daily as needed. For dry eyes  . Cyanocobalamin (VITAMIN B-12) 2500 MCG SUBL Place 1 tablet under the tongue daily.  . fish oil-omega-3 fatty acids 1000 MG capsule Take 2 g by mouth every evening.   . gabapentin (NEURONTIN) 100 MG capsule Take 1 pill at bedtime as needed for leg pain.  . hydrochlorothiazide (HYDRODIURIL) 25 MG tablet Take 1 tablet (25 mg total) by mouth daily.  Marland Kitchen losartan (COZAAR) 100 MG tablet Take 1 tablet (100 mg total) by mouth daily.  . Magnesium 400 MG CAPS Take by mouth daily.   . metoprolol succinate (TOPROL-XL) 25 MG 24 hr tablet Take 1 tablet (25 mg total) by mouth daily.  . Vitamin D, Cholecalciferol, 50 MCG (2000 UT) CAPS Take by mouth.    No facility-administered encounter medications on file as of 09/06/2020.    Current Diagnosis: Patient Active Problem List   Diagnosis Date Noted  . Diabetes mellitus with stage 2 chronic kidney disease ([place]) 06/22/2018  . Chronic renal disease, stage II 06/22/2018  . Hypertensive  nephropathy 06/22/2018  . Adult BMI 29.0-29.9 kg/sq m 06/22/2018  . Pain in joint, pelvic region and thigh 03/28/2014  . Acquired absence of breast and absent nipple 05/05/2012  . Breast cancer of upper-inner quadrant of left female breast ([place]) 06/12/2011   Recent Relevant Labs: Lab Results  Component Value Date/Time   HGBA1C 6.0 (H) 05/30/2020 10:12 AM   HGBA1C 5.7 (H) 11/21/2019 04:10 PM   MICROALBUR 10 05/30/2020 10:12 AM    Kidney Function Lab Results  Component Value Date/Time   CREATININE 1.01 (H) 05/30/2020 10:12 AM   CREATININE 0.92 11/21/2019 04:10 PM   CREATININE 0.8 04/03/2015 11:09 AM   CREATININE 0.9 03/28/2014 11:21 AM   GFRNONAA 56 (L) 05/30/2020 10:12 AM   GFRAA 65 05/30/2020 10:12 AM    Reviewed chart prior to disease state call. Spoke with patient regarding BP  Recent Office Vitals: BP Readings from Last 3 Encounters:  05/30/20 134/82  05/30/20 134/82  05/08/20 (!) 152/78   Pulse Readings from Last 3 Encounters:  05/30/20 62  05/30/20 62  05/08/20 64    Wt Readings from Last 3 Encounters:  05/30/20 168 lb 3.2 oz (76.3 kg)  05/30/20 168 lb (76.2 kg)  05/08/20 164 lb 5 oz (74.5 kg)     Kidney Function Lab Results  Component Value Date/Time   CREATININE 1.01 (H) 05/30/2020  10:12 AM   CREATININE 0.92 11/21/2019 04:10 PM   CREATININE 0.8 04/03/2015 11:09 AM   CREATININE 0.9 03/28/2014 11:21 AM   GFRNONAA 56 (L) 05/30/2020 10:12 AM   GFRAA 65 05/30/2020 10:12 AM    BMP Latest Ref Rng & Units 05/30/2020 11/21/2019 05/12/2019  Glucose 65 - 99 mg/dL 120(H) 106(H) 114(H)  BUN 8 - 27 mg/dL 17 15 19   Creatinine 0.57 - 1.00 mg/dL 1.01(H) 0.92 0.96  BUN/Creat Ratio 12 - 28 17 16 20   Sodium 134 - 144 mmol/L 139 142 138  Potassium 3.5 - 5.2 mmol/L 3.4(L) 3.8 3.8  Chloride 96 - 106 mmol/L 101 104 103  CO2 20 - 29 mmol/L 23 24 20   Calcium 8.7 - 10.3 mg/dL 9.8 9.8 10.0    . Current antihypertensive regimen:   Hydrochlorothiazide 25mg  daily  Losartan  100mg  daily  Metoprolol succinate 25mg  daily  . How often are you checking your Blood Pressure? Patient reports she does not check her blood pressure at home but does have a blood pressure cuff. The patient stated she will start checking weekly, and if symptomatic, document, and provide at future appointments as recommended.    . Current home BP readings: None   . What recent interventions/DTPs have been made by any provider to improve Blood Pressure control since last CPP Visit:  - Recent interventions was to check BP weekly and if symptomatic, document, and provide at future appointments, Ensure daily salt intake < 2300 mg/day, Exercise for 30 minutes daily 5 times per week (150 minutes total per week).  Marland Kitchen Any recent hospitalizations or ED visits since last visit with CPP? No   . What diet changes have been made to improve Blood Pressure Control?  o Patient reports she goes to the [facility] 3 days out of the week for at least an hour and half. Patient stated she will start water aerobics in March. Patient states she is trying to more fruits and vegeatbles. Patient stated she is trying to cut back from carbs and sweets.   . What exercise is being done to improve your Blood Pressure Control?   [facility] an [facility] and half. Patient stated she  will start water aerobics in March.  Adherence Review: Is the patient currently on ACE/ARB medication? Yes Does the patient have >5 day gap between last estimated fill dates? No    Goals Addressed            This Visit's Progress   . Pharmacy Care Plan   Not on track    [place] (see longitudinal plan of care for additional care plan information)  Current Barriers:  . Chronic Disease Management support, education, and care coordination needs related to Hypertension, Hyperlipidemia, Diabetes, and Osteopenia/Osteoporosis Screening   Hypertension BP Readings from Last 3 Encounters:   12/12/19 (!) 147/72  12/05/19 128/70  11/21/19 118/66   . Pharmacist Clinical Goal(s): o Over the next 180 days, patient will work with PharmD and providers to maintain BP goal <140/90 . Current regimen:   Hydrochlorothiazide 25mg  daily  Losartan 100mg  daily  Metoprolol succinate 25mg  daily . Interventions: o Provided dietary and exercise recommendations . Patient self care activities - Over the next 180 days, patient will: o Check BP weekly and if symptomatic, document, and provide at future appointments o Ensure daily salt intake < 2300 mg/day o Exercise for 30 minutes daily 5 times  per week (150 minutes total per week)  Hyperlipidemia Lab Results  Component Value Date/Time   LDLCALC 96 05/12/2019 10:04 AM   . Pharmacist Clinical Goal(s): o Over the next 90 days, patient will work with PharmD and providers to achieve LDL goal < 70 . Current regimen:  . Atorvastatin 20mg  daily . Omega-3 fatty acids 2000mg  2 capsules every evening . Interventions: o Provided dietary and exercise recommendations o Patient complained of size and smell of omega 3 capsules - Advised patient she could try odorless fish oil or try krill oil (smaller capsules) . Patient self care activities - Over the next 90 days, patient will: o Exercise for 30 minutes daily 5 times per week (150 minutes total per week) o Try odorless fish oil or krill oil  Prediabetes Lab Results  Component Value Date/Time   HGBA1C 5.7 (H) 11/21/2019 04:10 PM   HGBA1C 5.6 05/12/2019 10:04 AM   . Pharmacist Clinical Goal(s): o Over the next 180 days, patient will work with PharmD and providers to maintain A1c goal <7% . Current regimen:  o N/A . Interventions: o Provided dietary and exercise recommendations o Recommend patient increase water intake to 64 ounces daily o Discussed importance of diet and exercise to lower A1c back to normal range . Patient self care activities - Over the next 180 days, patient  will: o Check blood sugar occasionally, document, and provide at future appointments o Contact provider with any episodes of hypoglycemia o Exercise for 30 minutes daily 5 times per week (150 minutes total per week)  Osteopenia/Osteoporosis Screening . Pharmacist Clinical Goal(s) o Over the next 180 days, patient will work with PharmD and providers to screen patient for osteopenia/osteoporosis and supplement appropriately . Current regimen:  o N/A . Interventions: o Recommend DEXA scan (last order expired) o Recommend Vitamin D level o Recommend Vitamin D 2000 units daily and 1200mg  calcium daily o Recommend weight-bearing exercises 3 times weekly to build and maintain bone density . Patient self care activities - Over the next 180 days, patient will: o Have bone density scan performed o Start taking Vitamin D3 2000 units daily and calcium 1200mg  daily o Start weight-bearing exercises three times weekly  Medication management . Pharmacist Clinical Goal(s): o Over the next 180 days, patient will work with PharmD and providers to maintain optimal medication adherence . Current pharmacy: [facility] . Interventions o Comprehensive medication review performed. o Continue current medication management strategy . Patient self care activities - Over the next 180 days, patient will: o Focus on medication adherence by continued use of pill box o Take medications as prescribed o Report any questions or concerns to PharmD and/or provider(s)          Follow-Up:  Pharmacist Review-Patient reports she had cataract surgery on right eye 08/09/20. Second cataract surgery was done on left eye 08/23/20. Patient stated she is low on her Losartan 100 mg but she can call her pharmacy and have it refilled today.  The patient was also inquiring about Contour Next testing stripes for Microlet Next lancet device.The patient informed me she is unsure if her insurance will cover through pharmacy. Advised the  patient I will coordinate patient assistance through [facility] if unable to obtain testing stripes from the pharmacy. The patient voiced understanding.  Placed a new order for Contour Next Testing Stripes for the patient through [employer].  [name], CPP Notified.  Raynelle Highland, [place] Pharmacist Assistant [phone] CCM Total Time: 64  miuntes

## 2020-09-28 DIAGNOSIS — G4733 Obstructive sleep apnea (adult) (pediatric): Secondary | ICD-10-CM | POA: Diagnosis not present

## 2020-10-29 ENCOUNTER — Telehealth: Payer: Self-pay

## 2020-10-29 DIAGNOSIS — G4733 Obstructive sleep apnea (adult) (pediatric): Secondary | ICD-10-CM | POA: Diagnosis not present

## 2020-10-29 NOTE — Progress Notes (Signed)
10/29/20-Attempted to outreach the patient to remind her of CCM Call appointment on 10/30/20 at 8:30 AM with [name], CPP. No answer, left a message of appointment time and date. Informed the patient in message to have medications and supplements near during phone visit.  [name], CPP Notified.  Raynelle Highland, [place] Pharmacist Assistant [phone] CCM Total Time: 5 minutes

## 2020-10-30 ENCOUNTER — Ambulatory Visit (INDEPENDENT_AMBULATORY_CARE_PROVIDER_SITE_OTHER): Payer: PPO

## 2020-10-30 DIAGNOSIS — E782 Mixed hyperlipidemia: Secondary | ICD-10-CM | POA: Diagnosis not present

## 2020-10-30 DIAGNOSIS — I10 Essential (primary) hypertension: Secondary | ICD-10-CM

## 2020-10-30 NOTE — Progress Notes (Signed)
Chronic Care Management Pharmacy Note  10/30/2020 Name:  CHIMAMANDA SIEGFRIED MRN:  [id number] DOB:  January 12, 1951  Subjective: Martha Pham is an 70 y.o. year old female who is a primary patient of Glendale Chard, MD.  The CCM team was consulted for assistance with disease management and care coordination needs.  She currently has a part time job to make sure she can travel. She is working at an at home daycare and her biggest responsibility is making sure the kids have breakfast and lunch in the morning. It is only five hours. She has a great time and her career was in banking. She works on Monday, Wednesday, Friday. This is her third week working at the daycare and she is going to add some things to her habits. She had cataract surgery on January 20th and February 3rd.  She is going to see [facility] next week at the [place] center.   Engaged with patient by telephone for follow up visit in response to provider referral for pharmacy case management and/or care coordination services.   Consent to Services:  The patient was given information about Chronic Care Management services, agreed to services, and gave verbal consent prior to initiation of services.  Please see initial visit note for detailed documentation.   Patient Care Team: Glendale Chard, MD as PCP - General (Internal Medicine) Mayford Knife, [facility] (Pharmacist)  Recent office visits: 05/30/2020-PCP OV  Recent consult visits: 05/08/2020  Hospital visits: None in previous 6 months  Objective:  Lab Results  Component Value Date   CREATININE 1.01 (H) 05/30/2020   BUN 17 05/30/2020   GFRNONAA 56 (L) 05/30/2020   GFRAA 65 05/30/2020   NA 139 05/30/2020   K 3.4 (L) 05/30/2020   CALCIUM 9.8 05/30/2020   CO2 23 05/30/2020   GLUCOSE 120 (H) 05/30/2020    Lab Results  Component Value Date/Time   HGBA1C 6.0 (H) 05/30/2020 10:12 AM   HGBA1C 5.7 (H) 11/21/2019 04:10 PM   MICROALBUR 10 05/30/2020 10:12 AM    Last diabetic Eye  exam:  Lab Results  Component Value Date/Time   HMDIABEYEEXA No Retinopathy 05/20/2018 12:00 AM    Last diabetic Foot exam: No results found for: HMDIABFOOTEX   Lab Results  Component Value Date   CHOL 170 05/30/2020   HDL 64 05/30/2020   LDLCALC 92 05/30/2020   TRIG 73 05/30/2020   CHOLHDL 2.7 05/30/2020    Hepatic Function Latest Ref Rng & Units 05/30/2020 11/21/2019 05/12/2019  Total Protein 6.0 - 8.5 g/dL 7.5 7.3 7.5  Albumin 3.7 - 4.7 g/dL 4.4 4.6 4.4  AST 0 - 40 IU/L _0 ALT 0 - 32 IU/L _1 Alk Phosphatase 44 - 121 IU/L 85 87 91  Total Bilirubin 0.0 - 1.2 mg/dL 0.5 0.6 0.5    Lab Results  Component Value Date/Time   TSH 0.983 05/12/2019 10:04 AM   TSH 0.845 01/20/2019 10:51 AM   FREET4 1.26 05/12/2019 10:04 AM    CBC Latest Ref Rng & Units 05/30/2020 05/12/2019 01/20/2019  WBC 3.4 - 10.8 x10E3/uL 5.8 5.9 5.2  Hemoglobin 11.1 - 15.9 g/dL 12.0 13.2 12.5  Hematocrit 34.0 - 46.6 % 36.2 39.5 37.7  Platelets 150 - 450 x10E3/uL 209 199 183    Lab Results  Component Value Date/Time   VD25OH 56 09/12/2013 09:37 AM    Clinical ASCVD: No  The 10-year ASCVD risk score Mikey Bussing DC Jr., et al., 2013) is: 26.6%  Values used to calculate the score:     Age: 39 years     Sex: Female     Is Non-Hispanic African American: Yes     Diabetic: Yes     Tobacco smoker: No     Systolic Blood Pressure: 665 mmHg     Is BP treated: Yes     HDL Cholesterol: 64 mg/dL     Total Cholesterol: 170 mg/dL    Depression screen [facility] 2/9 05/30/2020 05/30/2020 05/12/2019  Decreased Interest 0 0 0  Down, Depressed, Hopeless 0 0 1  PHQ - 2 Score 0 0 1  Altered sleeping - - -  Tired, decreased energy - - -  Change in appetite - - -  Feeling bad or failure about yourself  - - -  Trouble concentrating - - -  Moving slowly or fidgety/restless - - -  Suicidal thoughts - - -  PHQ-9 Score - - -  Difficult doing work/chores - - -  Some recent data might be hidden     Social History    Tobacco Use  Smoking Status Never Smoker  Smokeless Tobacco Never Used   BP Readings from Last 3 Encounters:  05/30/20 134/82  05/30/20 134/82  05/08/20 (!) 152/78   Pulse Readings from Last 3 Encounters:  05/30/20 62  05/30/20 62  05/08/20 64   Wt Readings from Last 3 Encounters:  05/30/20 168 lb 3.2 oz (76.3 kg)  05/30/20 168 lb (76.2 kg)  05/08/20 164 lb 5 oz (74.5 kg)   BMI Readings from Last 3 Encounters:  05/30/20 33.97 kg/m  05/30/20 33.93 kg/m  05/08/20 33.19 kg/m    Assessment/Interventions: Review of patient past medical history, allergies, medications, health status, including review of consultants reports, laboratory and other test data, was performed as part of comprehensive evaluation and provision of chronic care management services.   SDOH:  (Social Determinants of Health) assessments and interventions performed: No  SDOH Screenings   Alcohol Screen: Not on file  Depression (PHQ2-9): Low Risk   . PHQ-2 Score: 0  Financial Resource Strain: Low Risk   . Difficulty of Paying Living Expenses: Not hard at all  Food Insecurity: No Food Insecurity  . Worried About Charity fundraiser in the Last Year: Never true  . Ran Out of Food in the Last Year: Never true  Housing: Not on file  Physical Activity: Inactive  . Days of Exercise per Week: 0 days  . Minutes of Exercise per Session: 0 min  Social Connections: Not on file  Stress: No Stress Concern Present  . Feeling of Stress : Not at all  Tobacco Use: Low Risk   . Smoking Tobacco Use: Never Smoker  . Smokeless Tobacco Use: Never Used  Transportation Needs: No Transportation Needs  . Lack of Transportation (Medical): No  . Lack of Transportation (Non-Medical): No    CCM Care Plan  Allergies  Allergen Reactions  . Dust Mite Extract Itching  . Other     grass    Medications Reviewed Today    Reviewed by Mayford Knife, [facility] (Pharmacist) on 10/30/20 at [facility] Status: <None>   Medication Order Taking? Sig Documenting Provider Last Dose Status Informant  acetaminophen (TYLENOL) 500 MG tablet [id number] Yes Take 1,000 mg by mouth every 6 (six) hours as needed for moderate pain.  [provider] Taking Active Self  Ascorbic Acid (VITAMIN C) 1000 MG tablet [id number] Yes Take 1,000 mg by mouth daily. [provider] Taking Active Self  atorvastatin (LIPITOR) 20 MG tablet [id number] Yes Take 1 tablet (20 mg total) by mouth daily. Sanders, Robyn, MD Taking Active   calcium carbonate (OS-CAL) 600 MG TABS [id number] Yes Take 600 mg by mouth 2 (two) times daily with a meal.   [provider] Taking Active Self  Carboxymethylcellulose Sodium (REFRESH OP) [id number] Yes Apply 1 drop to eye 2 (two) times daily as needed. For dry eyes [provider] Taking Active Self  Cyanocobalamin (VITAMIN B-12) 2500 MCG SUBL [id number] Yes Place 1 tablet under the tongue daily. [provider] Taking Active Self  fish oil-omega-3 fatty acids 1000 MG capsule [id number] Yes Take 2 g by mouth every evening.  [provider] Taking Active Self  gabapentin (NEURONTIN) 100 MG capsule [id number] Yes Take 1 pill at bedtime as needed for leg pain. Athar, Saima, MD Taking Active   hydrochlorothiazide (HYDRODIURIL) 25 MG tablet [id number] Yes Take 1 tablet (25 mg total) by mouth daily. Sanders, Robyn, MD Taking Active   losartan (COZAAR) 100 MG tablet [id number] Yes Take 1 tablet (100 mg total) by mouth daily. Sanders, Robyn, MD Taking Active   Magnesium 400 MG CAPS [id number] Yes Take by mouth daily.  [provider] Taking Active   metoprolol succinate (TOPROL-XL) 25 MG 24 hr tablet [id number] Yes Take 1 tablet (25 mg total) by mouth daily. Sanders, Robyn, MD Taking Active   Vitamin D, Cholecalciferol, 50 MCG (2000 UT) CAPS [id number] Yes Take by mouth.  [provider] Taking Active           Patient Active Problem List   Diagnosis Date Noted   . Diabetes mellitus with stage 2 chronic kidney disease (HCC) 06/22/2018  . Chronic renal disease, stage II 06/22/2018  . Hypertensive nephropathy 06/22/2018  . Adult BMI 29.0-29.9 kg/sq m 06/22/2018  . Pain in joint, pelvic region and thigh 03/28/2014  . Acquired absence of breast and absent nipple 05/05/2012  . Breast cancer of upper-inner quadrant of left female breast (HCC) 06/12/2011    Immunization History  Administered Date(s) Administered  . Fluad Quad(high Dose 65+) 05/30/2020  . Influenza, High Dose Seasonal PF 05/05/2018, 04/14/2019  . Influenza,inj,Quad PF,6+ Mos 04/04/2014  . PFIZER(Purple Top)SARS-COV-2 Vaccination 11/03/2019, 11/28/2019  . Pneumococcal Polysaccharide-23 05/12/2019    Conditions to be addressed/monitored:  Hypertension and Hyperlipidemia  Care Plan : Pharmacy Care Plan  Updates made by Pearson, Vallie J, RPH since 10/30/2020 12:00 AM    Problem: HTN, HLD   Priority: High  Onset Date: 10/30/2020    Long-Range Goal: HTN, HLD   Start Date: 10/30/2020  This Visit's Progress: On track  Priority: High  Note:   Current Barriers:   Unable to independently monitor therapeutic efficacy     Pharmacist Clinical Goal(s):   Patient will verbalize ability to afford treatment regimen through collaboration with PharmD and provider.      Interventions:  1:1 collaboration with Sanders, Robyn, MD regarding development and update of comprehensive plan of care as evidenced by provider attestation and co-signature  Inter-disciplinary care team collaboration (see longitudinal plan of care)  Comprehensive medication review performed; medication list updated in electronic medical record   Hypertension (BP goal <140/90) -Controlled -Current treatment:  Hydrochlorothiazide 25 mg tablet once per day  Losartan 100 mg tablet once per day   Metoprolol Succinate 25 mg taking 1 tablet daily  -Current home readings:  -Current dietary habits: 148/78  -Current  exercise habits: please see hyperlipidemia  -Denies   hypotensive/hypertensive symptoms -Educated on Daily salt intake goal < 2300 mg; Exercise goal of 150 minutes per week; Importance of home blood pressure monitoring; -Counseled to monitor BP at home at ;[place] twice per week, document, and provide log at future appointments -Recommended to continue current medication   Hyperlipidemia: (LDL goal < 100) -Controlled -Current treatment:  Atorvastatin 20 mg - taking 1 tablet by mouth daily.  -Current dietary patterns: she has stopped keeping starches, wheat and bread in the house.  -Current exercise habits: she makes sure that she is doing a certain amount of steps per day. She was exercising three [facility] a [facility] [employer]. She is currently getting 7000-8000 steps per day.  She makes sure to keep the cart far so she walks more. She is going to start going to the [employer] on the days that she is not working.  -Educated on Cholesterol goals;  Benefits of statin for ASCVD risk reduction; Importance of limiting foods high in cholesterol; -Recommended to continue current medication   Health Maintenance -Vaccine gaps:  1. COVID -19 Booster (4th dose)  ? Used NCIR system to identify dates of patients last vaccine doses: 07/08/2020 -Educated on the importance of the 4th booster shot.  -Patient reports that she will get the Pfizer booster in May, she is going to schedule her appointment sometime this week.   -Collaborated with PCP team to add booster shot from 07/08/2020 in patients chart.      Patient Goals/Self-Care Activities  Patient will:  - take medications as prescribed  -Patient had Tb test placed on Monday she wanted to know when the test should be read.     -Explained to patient that test should be read between 48-72 hours after placement.    Follow Up Plan: Telephone follow up appointment with care management team member scheduled for: The patient has been provided with contact  information for the care management team and has been advised to call with any health related questions or concerns.       Medication Assistance: None required.  Patient affirms current coverage meets needs.  Patient's preferred pharmacy is:  [facility] - [place], [place] - [address] [place] [address] Phone: [phone] Fax: [phone]  [id number] - [place], [place] - [address] [place] [address] Phone: [phone] Fax: [phone]  Uses pill box? Yes Pt endorses 100% compliance  We discussed: Current pharmacy is preferred with insurance plan and patient is satisfied with pharmacy services Patient decided to: Continue current medication management strategy  Care Plan and Follow Up Patient Decision:  Patient agrees to Care Plan and Follow-up.  Plan: Telephone follow up appointment with care management team member scheduled for:  12/04/2020 and The patient has been provided with contact information for the care management team and has been advised to call with any health related questions or concerns.    Vallie Pearson, PharmD Clinical Pharmacist [facility] [phone]     

## 2020-10-30 NOTE — Patient Instructions (Signed)
Visit Information It was great speaking with you today!  Please let me know if you have any questions about our visit.  Goals Addressed            This Visit's Progress   . Track and Manage My Blood Pressure-Hypertension       Timeframe:  Long-Range Goal Priority:  High Start Date:                             Expected End Date:                       Follow Up Date 12/04/2020   - check blood pressure 3 times per week - choose a place to take my blood pressure (home, clinic or office, retail store) - write blood pressure results in a log or diary    Why is this important?    You won't feel high blood pressure, but it can still hurt your blood vessels.   High blood pressure can cause heart or kidney problems. It can also cause a stroke.   Making lifestyle changes like losing a little weight or eating less salt will help.   Checking your blood pressure at home and at different times of the day can help to control blood pressure.   If the doctor prescribes medicine remember to take it the way the doctor ordered.   Call the office if you cannot afford the medicine or if there are questions about it.            Patient Care Plan: Pharmacy Care Plan    Problem Identified: HTN, HLD   Priority: High  Onset Date: 10/30/2020    Long-Range Goal: HTN, HLD   Start Date: 10/30/2020  This Visit's Progress: On track  Priority: High  Note:   Current Barriers:   Unable to independently monitor therapeutic efficacy     Pharmacist Clinical Goal(s):   Patient will verbalize ability to afford treatment regimen through collaboration with PharmD and provider.      Interventions:  1:1 collaboration with Glendale Chard, MD regarding development and update of comprehensive plan of care as evidenced by provider attestation and co-signature  Inter-disciplinary care team collaboration (see longitudinal plan of care)  Comprehensive medication review performed; medication list updated  in electronic medical record   Hypertension (BP goal <140/90) -Controlled -Current treatment:  Hydrochlorothiazide 25 mg tablet once per day  Losartan 100 mg tablet once per day   Metoprolol Succinate 25 mg taking 1 tablet daily  -Current home readings:  -Current dietary habits: 148/78  -Current exercise habits: please see hyperlipidemia  -Denies hypotensive/hypertensive symptoms -Educated on Daily salt intake goal < 2300 mg; Exercise goal of 150 minutes per week; Importance of home blood pressure monitoring; -Counseled to monitor BP at home at ;[place] twice per week, document, and provide log at future appointments -Recommended to continue current medication   Hyperlipidemia: (LDL goal < 100) -Controlled -Current treatment:  Atorvastatin 20 mg - taking 1 tablet by mouth daily.  -Current dietary patterns: she has stopped keeping starches, wheat and bread in the house.  -Current exercise habits: she makes sure that she is doing a certain amount of steps per day. She was exercising three times a week at the [facility]. She is currently getting 7000-8000 steps per day.  She makes sure to keep the cart far so she walks more. She is going to start going  to the [facility] on the days that she is not working.  -Educated on Cholesterol goals;  Benefits of statin for ASCVD risk reduction; Importance of limiting foods high in cholesterol; -Recommended to continue current medication   Health Maintenance -Vaccine gaps:  1. COVID -19 Booster (4th dose)  ? Used NCIR system to identify dates of patients last vaccine doses: 07/08/2020 -Educated on the importance of the 4th booster shot.  -Patient reports that she will get [employer] booster in May, she is going to schedule her appointment sometime this week.   -Collaborated with PCP team to add booster shot from 07/08/2020 in patients chart.      Patient Goals/Self-Care Activities  Patient will:  - take medications as prescribed  -Patient had Tb test  placed on Monday she wanted to know when the test should be read.     -Explained to patient that test should be read between 48-72 hours after placement.    Follow Up Plan: Telephone follow up appointment with care management team member scheduled for: The patient has been provided with contact information for the care management team and has been advised to call with any health related questions or concerns.       Patient agreed to services and verbal consent obtained.   Patient verbalizes understanding of instructions provided today and agrees to view in [place].   [name], PharmD Clinical Pharmacist [facility] [phone]

## 2020-11-06 ENCOUNTER — Telehealth: Payer: Self-pay

## 2020-11-06 MED ORDER — CONTOUR NEXT TEST VI STRP
ORAL_STRIP | 3 refills | Status: DC
Start: 2020-11-06 — End: 2022-03-13

## 2020-11-06 MED ORDER — MICROLET LANCETS MISC: 3 refills | Status: DC

## 2020-11-06 NOTE — Telephone Encounter (Signed)
Pt notified rx for test strips sent to the pharmacy

## 2020-11-13 ENCOUNTER — Encounter: Payer: Self-pay | Admitting: Adult Health

## 2020-11-13 ENCOUNTER — Other Ambulatory Visit: Payer: Self-pay

## 2020-11-13 ENCOUNTER — Encounter: Payer: Self-pay | Admitting: Internal Medicine

## 2020-11-13 ENCOUNTER — Ambulatory Visit: Payer: PPO | Admitting: Adult Health

## 2020-11-13 ENCOUNTER — Ambulatory Visit (INDEPENDENT_AMBULATORY_CARE_PROVIDER_SITE_OTHER): Payer: PPO | Admitting: Internal Medicine

## 2020-11-13 VITALS — BP 151/72 | HR 75 | Ht 59.0 in | Wt 167.0 lb

## 2020-11-13 VITALS — BP 126/66 | HR 68 | Temp 98.0°F | Ht 59.0 in | Wt 167.0 lb

## 2020-11-13 DIAGNOSIS — E6609 Other obesity due to excess calories: Secondary | ICD-10-CM

## 2020-11-13 DIAGNOSIS — I129 Hypertensive chronic kidney disease with stage 1 through stage 4 chronic kidney disease, or unspecified chronic kidney disease: Secondary | ICD-10-CM | POA: Diagnosis not present

## 2020-11-13 DIAGNOSIS — Z6833 Body mass index (BMI) 33.0-33.9, adult: Secondary | ICD-10-CM | POA: Diagnosis not present

## 2020-11-13 DIAGNOSIS — R1031 Right lower quadrant pain: Secondary | ICD-10-CM

## 2020-11-13 DIAGNOSIS — N182 Chronic kidney disease, stage 2 (mild): Secondary | ICD-10-CM

## 2020-11-13 DIAGNOSIS — E1122 Type 2 diabetes mellitus with diabetic chronic kidney disease: Secondary | ICD-10-CM

## 2020-11-13 DIAGNOSIS — Z9989 Dependence on other enabling machines and devices: Secondary | ICD-10-CM | POA: Diagnosis not present

## 2020-11-13 DIAGNOSIS — Z853 Personal history of malignant neoplasm of breast: Secondary | ICD-10-CM | POA: Diagnosis not present

## 2020-11-13 DIAGNOSIS — G4733 Obstructive sleep apnea (adult) (pediatric): Secondary | ICD-10-CM | POA: Diagnosis not present

## 2020-11-13 DIAGNOSIS — G2581 Restless legs syndrome: Secondary | ICD-10-CM

## 2020-11-13 MED ORDER — GABAPENTIN 100 MG PO CAPS: ORAL_CAPSULE | ORAL | 3 refills | Status: DC

## 2020-11-13 NOTE — Progress Notes (Signed)
I,Martha Pham,acting as a Education administrator for Martha Greenland, MD.,have documented all relevant documentation on the behalf of Martha Greenland, MD,as directed by  Martha Greenland, MD while in the presence of Martha Greenland, MD.  This visit occurred during the SARS-CoV-2 public health emergency.  Safety protocols were in place, including screening questions prior to the visit, additional usage of staff PPE, and extensive cleaning of exam room while observing appropriate contact time as indicated for disinfecting solutions.  Subjective:     Patient ID: Martha Pham , female    DOB: [birth year] , 70 y.o.   MRN: [id number]   Chief Complaint  Patient presents with  . Hypertension    HPI  She is here today for a blood pressure f/u.  She reports compliance with meds. She denies headaches, chest pain and shortness of breath. She admits that she is not exercising on a regular basis.   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 She has already been to the health department to have TB testing. She was fingerprinted today at the police department.   Hypertension This is a chronic problem. The current episode started more than 1 year ago. The problem has been gradually improving since onset. The problem is controlled. Risk factors for coronary artery disease include diabetes mellitus, dyslipidemia, post-menopausal state, obesity and sedentary lifestyle. The current treatment provides moderate improvement. Compliance problems include exercise.   Diabetes She presents for her follow-up diabetic visit. She has type 2 diabetes mellitus. Her disease course has been improving. There are no hypoglycemic associated symptoms. There are no diabetic associated symptoms. There are no hypoglycemic complications.     Past Medical History:  Diagnosis Date  . Apnea, sleep    does wear cpap  . Arthritis   . Cancer ([place])    lt. breast ca-snbx  . Chronic kidney disease   .  Contact lens/glasses fitting    wears contacts or glasses  . COPD (chronic obstructive pulmonary disease) (HCC)    brother  . Heart murmur   . Hypercholesteremia   . Hyperlipemia   . Hypertension   . Lower back pain   . Lung cancer ([place])   . Malaise and fatigue   . Personal history of chemotherapy 2013  . Sleep apnea    SLEEP STUDY DX 10,CPAP BUT DON'TUSE  . Vitamin D deficiency   . Wears dentures    upper     Family History  Problem Relation Age of Onset  . Brain cancer Father   . Heart attack Mother   . Breast cancer Sister 59  . Breast cancer Sister 25     Current Outpatient Medications:  .  acetaminophen (TYLENOL) 500 MG tablet, Take 1,000 mg by mouth every 6 (six) hours as needed for moderate pain. , Disp: , Rfl:  .  Ascorbic Acid (VITAMIN C) 1000 MG tablet, Take 1,000 mg by mouth daily., Disp: , Rfl:  .  atorvastatin (LIPITOR) 20 MG tablet, Take 1 tablet (20 mg total) by mouth daily., Disp: 90 tablet, Rfl: 2 .  calcium carbonate (OS-CAL) 600 MG TABS, Take 600 mg by mouth 2 (two) times daily with a meal.  , Disp: , Rfl:  .  Carboxymethylcellulose Sodium (REFRESH OP), Apply 1 drop to eye 2 (two) times daily as needed. For dry eyes, Disp: , Rfl:  .  Cyanocobalamin (VITAMIN B-12) 2500 MCG SUBL, Place 1 tablet under the tongue daily., Disp: ,  Rfl:  .  fish oil-omega-3 fatty acids 1000 MG capsule, Take 2 g by mouth every evening. , Disp: , Rfl:  .  gabapentin (NEURONTIN) 100 MG capsule, Take 1 pill at bedtime as needed for leg pain., Disp: 90 capsule, Rfl: 3 .  glucose blood (CONTOUR NEXT TEST) test strip, Use as directed to check blood sugars 1 time per day dx: e11.65, Disp: 100 each, Rfl: 3 .  hydrochlorothiazide (HYDRODIURIL) 25 MG tablet, Take 1 tablet (25 mg total) by mouth daily., Disp: 90 tablet, Rfl: 2 .  losartan (COZAAR) 100 MG tablet, Take 1 tablet (100 mg total) by mouth daily., Disp: 90 tablet, Rfl: 2 .  Magnesium 400 MG CAPS, Take by mouth daily. , Disp: , Rfl:   .  metoprolol succinate (TOPROL-XL) 25 MG 24 hr tablet, Take 1 tablet (25 mg total) by mouth daily., Disp: 90 tablet, Rfl: 2 .  Microlet Lancets MISC, Use as directed to check blood sugars 1 time per day dx: e11.65, Disp: 100 each, Rfl: 3 .  Vitamin D, Cholecalciferol, 50 MCG (2000 UT) CAPS, Take by mouth. , Disp: , Rfl:    Allergies  Allergen Reactions  . Dust Mite Extract Itching  . Other     grass     Review of Systems  Constitutional: Negative.   Respiratory: Negative.   Cardiovascular: Negative.   Gastrointestinal: Negative.   Musculoskeletal:       She c/o r groin pain. She denies fall/trauma. She admits she has been less active.   Psychiatric/Behavioral: Negative.   All other systems reviewed and are negative.    Today's Vitals   11/13/20 1428  BP: 126/66  Pulse: 68  Temp: 98 F (36.7 C)  Weight: 167 lb (75.8 kg)  Height: _0  (1.499 m)  PainSc: 6   PainLoc: Leg   Body mass index is 33.73 kg/m.  Wt Readings from Last 3 Encounters:  11/13/20 167 lb (75.8 kg)  11/13/20 167 lb (75.8 kg)  05/30/20 168 lb 3.2 oz (76.3 kg)   Objective:  Physical Exam Vitals and nursing note reviewed.  Constitutional:      Appearance: Normal appearance. She is obese.  HENT:     Head: Normocephalic and atraumatic.  Cardiovascular:     Rate and Rhythm: Normal rate and regular rhythm.     Pulses:          Dorsalis pedis pulses are 3+ on the right side and 3+ on the left side.     Heart sounds: Normal heart sounds.  Pulmonary:     Effort: Pulmonary effort is normal.     Breath sounds: Normal breath sounds.  Musculoskeletal:     Comments: r groin tenderness to palpation  Feet:     Right foot:     Protective Sensation: 5 sites tested. 5 sites sensed.     Skin integrity: Skin integrity normal.     Toenail Condition: Right toenails are normal.     Left foot:     Protective Sensation: 5 sites tested. 5 sites sensed.     Skin integrity: Skin integrity normal.     Toenail  Condition: Left toenails are normal.  Skin:    General: Skin is warm.  Neurological:     General: No focal deficit present.     Mental Status: She is alert.  Psychiatric:        Mood and Affect: Mood normal.        Behavior: Behavior normal.  Assessment And Plan:     1. Parenchymal renal hypertension, stage 1 through stage 4 or unspecified chronic kidney disease Comments: Well controlled. She will c/w losartan/hctz and metoprolol XL. I will check renal function today. She will f/u in 6-7 months for her CPE.  - CMP14+EGFR  2. Diabetes mellitus with stage 2 chronic kidney disease ([place]) Comments: I will request her most recent eye exam.Diabetic foot exam was performed. She will rto in Nov 2022 for her physical examination.  - Hemoglobin A1c  3. Right groin pain Comments: She was given stretching exercises to perform. Also advised to start walking more regularly. She will let me know if her sx persist.  4. Class 1 obesity due to excess calories with serious comorbidity and body mass index (BMI) of 33.0 to 33.9 in adult Comments:  She is encouraged to strive for BMI less than 30 to decrease cardiac risk. Advised to aim for at least 150 minutes of exercise per week.  5. History of breast cancer Comments: Most recent Onc notes reviewed. She will c/w yearly mammograms. She will c/w LTS surveillance and monitoring w/ Cancer Center.  Patient was given opportunity to ask questions. Patient verbalized understanding of the plan and was able to repeat key elements of the plan. All questions were answered to their satisfaction.   I, Martha Greenland, MD, have reviewed all documentation for this visit. The documentation on 11/13/20 for the exam, diagnosis, procedures, and orders are all accurate and complete.   IF YOU HAVE BEEN REFERRED TO A SPECIALIST, IT MAY TAKE 1-2 WEEKS TO SCHEDULE/PROCESS THE REFERRAL. IF YOU HAVE NOT HEARD FROM US/SPECIALIST IN TWO WEEKS, PLEASE GIVE [place] A CALL AT  [phone].   THE PATIENT IS ENCOURAGED TO PRACTICE SOCIAL DISTANCING DUE TO THE COVID-19 PANDEMIC.

## 2020-11-13 NOTE — Patient Instructions (Signed)
Continue using CPAP nightly and greater than 4 hours each night °If your symptoms worsen or you develop new symptoms please let us know.  ° °

## 2020-11-13 NOTE — Progress Notes (Addendum)
PATIENT: Martha Pham DOB: 02-10-1951  REASON FOR VISIT: follow up HISTORY FROM: patient Primary Neurologist: Dr. Rexene Alberts  HISTORY OF PRESENT ILLNESS: Today 11/13/20:  Martha Pham is a 70 year old female with a history of obstructive sleep apnea on CPAP.  She reports that she does not use the CPAP consistently.  She is trying to work on that.  She states that she has been entirely smaller nasal pillow to see if the leak improved.  Reports that gabapentin 100 mg at bedtime works well for her restless legs.  Otherwise she feels that it does work well for her.    HISTORY (Copied from Dr.Dohmeier's note) 05/08/2020: I reviewed her AutoPap compliance data from 04/06/2020 through 05/05/2020, which is a total of 30 days, during which time she used her machine 29 days with percent use days greater than 4 hours at 97%, indicating excellent compliance with an average usage of 7 hours and 14 minutes, residual AHI at goal at 1.1/h, 95th percentile of pressure at 11.7 cm with a range of 6 to 12 cm with EPR of 3, leak in the high acceptable range with the 95th percentile at 18.8 L/min.  She reports doing well with her AutoPap.  She has adapted well to treatment.  She tried a full facemask 1st but after about a week she realized that she could not keep the mask on consistently.  She switched out for a nasal pillows interface and also received a chinstrap which she has used from time to time.  She does have some mouth dryness, some days worse than others.  She tries to hydrate well with water but could do better she believes.  She has done well otherwise.  She feels improved as far as her daytime somnolence.  She continues to use gabapentin for restless leg symptoms, has had some intermittent numbness affecting her hands and sometimes her feet but no consistent numbness.  She has been labeled as prediabetic.  Last A1c from May 2021 was in the prediabetes range at 5.7. Her daughter and her family moved from  [place] to [place].  The patient has been able to visit them already.  She is planning another visit in December.  REVIEW OF SYSTEMS: Out of a complete 14 system review of symptoms, the patient complains only of the following symptoms, and all other reviewed systems are negative.  FSS 35 ESS 5  ALLERGIES: Allergies  Allergen Reactions  . Dust Mite Extract Itching  . Other     grass    HOME MEDICATIONS: Outpatient Medications Prior to Visit  Medication Sig Dispense Refill  . acetaminophen (TYLENOL) 500 MG tablet Take 1,000 mg by mouth every 6 (six) hours as needed for moderate pain.     . Ascorbic Acid (VITAMIN C) 1000 MG tablet Take 1,000 mg by mouth daily.    Marland Kitchen atorvastatin (LIPITOR) 20 MG tablet Take 1 tablet (20 mg total) by mouth daily. 90 tablet 2  . calcium carbonate (OS-CAL) 600 MG TABS Take 600 mg by mouth 2 (two) times daily with a meal.      . Carboxymethylcellulose Sodium (REFRESH OP) Apply 1 drop to eye 2 (two) times daily as needed. For dry eyes    . Cyanocobalamin (VITAMIN B-12) 2500 MCG SUBL Place 1 tablet under the tongue daily.    . fish oil-omega-3 fatty acids 1000 MG capsule Take 2 g by mouth every evening.     . gabapentin (NEURONTIN) 100 MG capsule Take 1 pill at bedtime  as needed for leg pain. 90 capsule 3  . glucose blood (CONTOUR NEXT TEST) test strip Use as directed to check blood sugars 1 time per day dx: e11.65 100 each 3  . hydrochlorothiazide (HYDRODIURIL) 25 MG tablet Take 1 tablet (25 mg total) by mouth daily. 90 tablet 2  . losartan (COZAAR) 100 MG tablet Take 1 tablet (100 mg total) by mouth daily. 90 tablet 2  . Magnesium 400 MG CAPS Take by mouth daily.     . metoprolol succinate (TOPROL-XL) 25 MG 24 hr tablet Take 1 tablet (25 mg total) by mouth daily. 90 tablet 2  . Microlet Lancets MISC Use as directed to check blood sugars 1 time per day dx: e11.65 100 each 3  . Vitamin D, Cholecalciferol, 50 MCG (2000 UT) CAPS Take by mouth.      No  facility-administered medications prior to visit.    PAST MEDICAL HISTORY: Past Medical History:  Diagnosis Date  . Apnea, sleep    does wear cpap  . Arthritis   . Cancer ([place])    lt. breast ca-snbx  . Chronic kidney disease   . Contact lens/glasses fitting    wears contacts or glasses  . COPD (chronic obstructive pulmonary disease) (HCC)    brother  . Heart murmur   . Hypercholesteremia   . Hyperlipemia   . Hypertension   . Lower back pain   . Lung cancer ([place])   . Malaise and fatigue   . Personal history of chemotherapy 2013  . Sleep apnea    SLEEP STUDY DX 10,CPAP BUT DON'TUSE  . Vitamin D deficiency   . Wears dentures    upper    PAST SURGICAL HISTORY: Past Surgical History:  Procedure Laterality Date  . BREAST BIOPSY Left 05/26/2011   malignant  . BREAST BIOPSY Right 10/29/2016  . BREAST IMPLANT EXCHANGE Left 09/23/2012   Procedure: REMOVAL OF LEFT EXPANDER/PLACEMENT OF IMPLANT WITH REDUCTION OF RIGHT BREAST;  Surgeon: Theodoro Kos, DO;  Location: [facility];  Service: Plastics;  Laterality: Left;  . BREAST LUMPECTOMY W/ NEEDLE LOCALIZATION  06/24/2011   left  . BREAST REDUCTION SURGERY Right 09/23/2012   Procedure: MAMMARY REDUCTION  (BREAST);  Surgeon: Theodoro Kos, DO;  Location: [facility];  Service: Plastics;  Laterality: Right;  . BREAST SURGERY  08/07/2011   re-exc. left breast margins  . CYST REMOVAL NECK  08/2013   Dr. Migdalia Dk  . LIPOSUCTION Bilateral 09/23/2012   Procedure: LIPOSUCTION;  Surgeon: Theodoro Kos, DO;  Location: [facility];  Service: Plastics;  Laterality: Bilateral;  . MASTECTOMY Left 2012  . MOUTH SURGERY     TOOTH IMPLANT BOTTOM X2 LFT  . PORT-A-CATH REMOVAL Right 09/23/2012   Procedure: REMOVAL PORT-A-CATH;  Surgeon: Theodoro Kos, DO;  Location: [facility];  Service: Plastics;  Laterality: Right;  . PORTACATH PLACEMENT  06/24/2011   Procedure: INSERTION PORT-A-CATH;  Surgeon:  Judieth Keens, DO;  Location: [facility];  Service: General;  Laterality: Right;  Right mediport placement  . RECONSTRUCTION BREAST IMMEDIATE / DELAYED W/ TISSUE EXPANDER  10/13   left  . REDUCTION MAMMAPLASTY Right 2013  . SIMPLE MASTECTOMY  09/01/2011   left  . TONSILLECTOMY      FAMILY HISTORY: Family History  Problem Relation Age of Onset  . Brain cancer Father   . Heart attack Mother   . Breast cancer Sister 23  . Breast cancer Sister 51  SOCIAL HISTORY: Social History   Socioeconomic History  . Marital status: Widowed    Spouse name: Not on file  . Number of children: 1  . Years of education: 43  . Highest education level: Not on file  Occupational History  . Occupation: Retired  Tobacco Use  . Smoking status: Never Smoker  . Smokeless tobacco: Never Used  Vaping Use  . Vaping Use: Never used  Substance and Sexual Activity  . Alcohol use: Not Currently    Alcohol/week: 2.0 standard drinks    Types: 2 Glasses of wine per week    Comment: 8 oz occasionally per week  . Drug use: No  . Sexual activity: Yes    Partners: Male    Birth control/protection: Post-menopausal  Other Topics Concern  . Not on file  Social History Narrative   Daughter [place], [place]   Social Determinants of Health   Financial Resource Strain: Low Risk   . Difficulty of Paying Living Expenses: Not hard at all  Food Insecurity: No Food Insecurity  . Worried About Charity fundraiser in the Last Year: Never true  . Ran Out of Food in the Last Year: Never true  Transportation Needs: No Transportation Needs  . Lack of Transportation (Medical): No  . Lack of Transportation (Non-Medical): No  Physical Activity: Inactive  . Days of Exercise per Week: 0 days  . Minutes of Exercise per Session: 0 min  Stress: No Stress Concern Present  . Feeling of Stress : Not at all  Social Connections: Not on file  Intimate Partner Violence: Not on file      PHYSICAL  EXAM  Vitals:   11/13/20 1302  BP: (!) 151/72  Pulse: 75  Weight: 167 lb (75.8 kg)  Height: 4\' 11"  (1.499 m)   Body mass index is 33.73 kg/m.  Generalized: Well developed, in no acute distress  Chest: Lungs clear to auscultation bilaterally  Neurological examination  Mentation: Alert oriented to time, place, history taking. Follows all commands speech and language fluent Cranial nerve II-XII: Extraocular movements were full, visual field were full on confrontational test Head turning and shoulder shrug  were normal and symmetric. Motor: The motor testing reveals 5 over 5 strength of all 4 extremities. Good symmetric motor tone is noted throughout.  Sensory: Sensory testing is intact to soft touch on all 4 extremities. No evidence of extinction is noted.  Gait and station: Gait is normal.    DIAGNOSTIC DATA (LABS, IMAGING, TESTING) - I reviewed patient records, labs, notes, testing and imaging myself where available.  Lab Results  Component Value Date   WBC 5.8 05/30/2020   HGB 12.0 05/30/2020   HCT 36.2 05/30/2020   MCV 97 05/30/2020   PLT 209 05/30/2020      Component Value Date/Time   NA 139 05/30/2020 1012   NA 142 04/03/2015 1109   K 3.4 (L) 05/30/2020 1012   K 3.1 (L) 04/03/2015 1109   CL 101 05/30/2020 1012   CL 106 08/17/2012 1351   CO2 23 05/30/2020 1012   CO2 24 04/03/2015 1109   GLUCOSE 120 (H) 05/30/2020 1012   GLUCOSE 135 (H) 02/26/2018 1433   GLUCOSE 112 04/03/2015 1109   GLUCOSE 116 (H) 08/17/2012 1351   BUN 17 05/30/2020 1012   BUN 7.9 04/03/2015 1109   CREATININE 1.01 (H) 05/30/2020 1012   CREATININE 0.8 04/03/2015 1109   CALCIUM 9.8 05/30/2020 1012   CALCIUM 9.7 04/03/2015 1109   PROT 7.5 05/30/2020  1012   PROT 7.4 04/03/2015 1109   ALBUMIN 4.4 05/30/2020 1012   ALBUMIN 3.9 04/03/2015 1109   AST 16 05/30/2020 1012   AST 15 04/03/2015 1109   ALT 17 05/30/2020 1012   ALT 18 04/03/2015 1109   ALKPHOS 85 05/30/2020 1012   ALKPHOS 74  04/03/2015 1109   BILITOT 0.5 05/30/2020 1012   BILITOT 0.64 04/03/2015 1109   GFRNONAA 56 (L) 05/30/2020 1012   GFRAA 65 05/30/2020 1012   Lab Results  Component Value Date   CHOL 170 05/30/2020   HDL 64 05/30/2020   LDLCALC 92 05/30/2020   TRIG 73 05/30/2020   CHOLHDL 2.7 05/30/2020   Lab Results  Component Value Date   HGBA1C 6.0 (H) 05/30/2020   No results found for: VITAMINB12 Lab Results  Component Value Date   TSH 0.983 05/12/2019      ASSESSMENT AND PLAN 70 y.o. year old female  has a past medical history of Apnea, sleep, Arthritis, Cancer ([place]), Chronic kidney disease, Contact lens/glasses fitting, COPD (chronic obstructive pulmonary disease) ([place]), Heart murmur, Hypercholesteremia, Hyperlipemia, Hypertension, Lower back pain, Lung cancer ([place]), Malaise and fatigue, Personal history of chemotherapy (2013), Sleep apnea, Vitamin D deficiency, and Wears dentures. here with:  1. OSA on CPAP  - CPAP compliance excellent - Good treatment of AHI  - Encourage patient to use CPAP nightly and > 4 hours each night -Advised that we could do a mask refitting if the leak did not improve with her changing the size of the mask.   2.  Restless leg syndrome  -Continue gabapentin 100 mg at bedtime - F/U in 1 year or sooner if needed   Ward Givens, MSN, NP-C 11/13/2020, 1:17 PM [facility] [address], [place],  [address] [phone]  I reviewed the above note and documentation by the Nurse Practitioner and agree with the history, exam, assessment and plan as outlined above. I was available for consultation. Star Age, MD, PhD [facility])

## 2020-11-14 LAB — CMP14+EGFR
ALT: 14 IU/L (ref 0–32)
AST: 19 IU/L (ref 0–40)
Albumin/Globulin Ratio: 1.4 (ref 1.2–2.2)
Albumin: 4.7 g/dL (ref 3.7–4.7)
Alkaline Phosphatase: 73 IU/L (ref 44–121)
BUN/Creatinine Ratio: 17 (ref 12–28)
BUN: 18 mg/dL (ref 8–27)
Bilirubin Total: 0.4 mg/dL (ref 0.0–1.2)
CO2: 22 mmol/L (ref 20–29)
Calcium: 10.2 mg/dL (ref 8.7–10.3)
Chloride: 102 mmol/L (ref 96–106)
Creatinine, Ser: 1.07 mg/dL — ABNORMAL HIGH (ref 0.57–1.00)
Globulin, Total: 3.3 g/dL (ref 1.5–4.5)
Glucose: 111 mg/dL — ABNORMAL HIGH (ref 65–99)
Potassium: 3.7 mmol/L (ref 3.5–5.2)
Sodium: 140 mmol/L (ref 134–144)
Total Protein: 8 g/dL (ref 6.0–8.5)
eGFR: 56 mL/min/{1.73_m2} — ABNORMAL LOW (ref >59)

## 2020-11-14 LAB — HEMOGLOBIN A1C
Est. average glucose Bld gHb Est-mCnc: 128 mg/dL
Hgb A1c MFr Bld: 6.1 % — ABNORMAL HIGH (ref 4.8–5.6)

## 2020-11-20 ENCOUNTER — Inpatient Hospital Stay: Payer: PPO | Admitting: Adult Health

## 2020-11-23 ENCOUNTER — Other Ambulatory Visit: Payer: Self-pay

## 2020-11-23 ENCOUNTER — Encounter: Payer: Self-pay | Admitting: Adult Health

## 2020-11-23 ENCOUNTER — Inpatient Hospital Stay: Payer: PPO | Attending: Adult Health | Admitting: Adult Health

## 2020-11-23 VITALS — BP 132/72 | HR 68 | Temp 97.5°F | Resp 18 | Ht 59.0 in | Wt 165.2 lb

## 2020-11-23 DIAGNOSIS — Z853 Personal history of malignant neoplasm of breast: Secondary | ICD-10-CM | POA: Diagnosis not present

## 2020-11-23 DIAGNOSIS — Z9221 Personal history of antineoplastic chemotherapy: Secondary | ICD-10-CM | POA: Diagnosis not present

## 2020-11-23 DIAGNOSIS — N189 Chronic kidney disease, unspecified: Secondary | ICD-10-CM | POA: Diagnosis not present

## 2020-11-23 DIAGNOSIS — Z79899 Other long term (current) drug therapy: Secondary | ICD-10-CM | POA: Diagnosis not present

## 2020-11-23 DIAGNOSIS — I129 Hypertensive chronic kidney disease with stage 1 through stage 4 chronic kidney disease, or unspecified chronic kidney disease: Secondary | ICD-10-CM | POA: Insufficient documentation

## 2020-11-23 DIAGNOSIS — Z171 Estrogen receptor negative status [ER-]: Secondary | ICD-10-CM | POA: Insufficient documentation

## 2020-11-23 DIAGNOSIS — Z9012 Acquired absence of left breast and nipple: Secondary | ICD-10-CM | POA: Diagnosis not present

## 2020-11-23 DIAGNOSIS — E785 Hyperlipidemia, unspecified: Secondary | ICD-10-CM | POA: Insufficient documentation

## 2020-11-23 NOTE — Progress Notes (Signed)
CLINIC:  Survivorship   REASON FOR VISIT:  Routine follow-up for history of breast cancer.   BRIEF ONCOLOGIC HISTORY:  Oncology History  Breast cancer of upper-inner quadrant of left female breast ([place]) (Resolved)  06/12/2011 Initial Diagnosis   Cancer of upper-inner quadrant of female breast: Grade 3 IDC ER negative PR negative HER-2 negative, MRI 2.7 x 2.3 x 1 cm mass   06/24/2011 Surgery   Left breast lumpectomy 2.2 cm IDC grade 3 with high-grade DCIS triple negative, for positive margins patient had reexcision: 2 lymph nodes negative ER/PR negative HER-2 negative ratio 0.97 Ki-67 75%   09/02/2011 Surgery   Left mastectomy   10/01/2011 - 12/03/2011 Chemotherapy   Adjuvant Taxotere and Cytoxan x4 cycles   07/05/2012 Surgery   Breast reconstruction on the left breast with reduction of right breast      INTERVAL HISTORY:  Martha Pham presents to the [facility] today for routine follow-up for her history of breast cancer.   Her most recent right breast mammogram was completed on 08/22/2020 and was normal and showed breast density category b.   Last year she reported some abdominal discomfort and a CT chest/abdomen/pelvis 11/2019 was normal and showed no concern for recurrence or other abnormalities that need continued imaging f/u.  Her rib pain and itching resolved.  She received a patch for this and it resolved.  She saw Dr. Claudia Desanctis recently about her keloid and a growing mole.  She was recommended to have the mole biopsied and removed, however she has not yet.  She plans on following up soon about this.   REVIEW OF SYSTEMS:  Review of Systems  Constitutional: Negative for appetite change, chills, fatigue, fever and unexpected weight change.  HENT:   Negative for hearing loss, lump/mass, mouth sores, sore throat and trouble swallowing.   Eyes: Negative for eye problems and icterus.  Respiratory: Negative for chest tightness, cough and shortness of breath.   Cardiovascular:  Negative for chest pain, leg swelling and palpitations.  Gastrointestinal: Negative for abdominal distention, abdominal pain, constipation, diarrhea, nausea and vomiting.  Endocrine: Negative for hot flashes.  Genitourinary: Negative for difficulty urinating.   Musculoskeletal: Negative for arthralgias.  Skin: Negative for itching and rash.  Neurological: Negative for dizziness, extremity weakness, headaches and numbness.  Hematological: Negative for adenopathy. Does not bruise/bleed easily.  Psychiatric/Behavioral: Negative for depression. The patient is not nervous/anxious.   Breast: Denies any new nodularity, masses, tenderness, nipple changes, or nipple discharge.     PAST MEDICAL/SURGICAL HISTORY:  Past Medical History:  Diagnosis Date  . Apnea, sleep    does wear cpap  . Arthritis   . Cancer ([place])    lt. breast ca-snbx  . Chronic kidney disease   . Contact lens/glasses fitting    wears contacts or glasses  . COPD (chronic obstructive pulmonary disease) ([facility])    brother  . Heart murmur   . Hypercholesteremia   . Hyperlipemia   . Hypertension   . Lower back pain   . Lung cancer ([place])   . Malaise and fatigue   . Personal history of chemotherapy 2013  . Sleep apnea    SLEEP STUDY DX 10,CPAP BUT DON'TUSE  . Vitamin D deficiency   . Wears dentures    upper   Past Surgical History:  Procedure Laterality Date  . BREAST BIOPSY Left 05/26/2011   malignant  . BREAST BIOPSY Right 10/29/2016  . BREAST IMPLANT EXCHANGE Left 09/23/2012   Procedure: REMOVAL OF LEFT EXPANDER/PLACEMENT  OF IMPLANT WITH REDUCTION OF RIGHT BREAST;  Surgeon: Theodoro Kos, DO;  Location: [facility];  Service: Plastics;  Laterality: Left;  . BREAST LUMPECTOMY W/ NEEDLE LOCALIZATION  06/24/2011   left  . BREAST REDUCTION SURGERY Right 09/23/2012   Procedure: MAMMARY REDUCTION  (BREAST);  Surgeon: Theodoro Kos, DO;  Location: [facility];  Service: Plastics;  Laterality: Right;   . BREAST SURGERY  08/07/2011   re-exc. left breast margins  . CYST REMOVAL NECK  08/2013   Dr. Migdalia Dk  . LIPOSUCTION Bilateral 09/23/2012   Procedure: LIPOSUCTION;  Surgeon: Theodoro Kos, DO;  Location: [facility];  Service: Plastics;  Laterality: Bilateral;  . MASTECTOMY Left 2012  . MOUTH SURGERY     TOOTH IMPLANT BOTTOM X2 LFT  . PORT-A-CATH REMOVAL Right 09/23/2012   Procedure: REMOVAL PORT-A-CATH;  Surgeon: Theodoro Kos, DO;  Location: [facility];  Service: Plastics;  Laterality: Right;  . PORTACATH PLACEMENT  06/24/2011   Procedure: INSERTION PORT-A-CATH;  Surgeon: Judieth Keens, DO;  Location: [facility];  Service: General;  Laterality: Right;  Right mediport placement  . RECONSTRUCTION BREAST IMMEDIATE / DELAYED W/ TISSUE EXPANDER  10/13   left  . REDUCTION MAMMAPLASTY Right 2013  . SIMPLE MASTECTOMY  09/01/2011   left  . TONSILLECTOMY       ALLERGIES:  Allergies  Allergen Reactions  . Dust Mite Extract Itching  . Other     grass     CURRENT MEDICATIONS:  Outpatient Encounter Medications as of 11/23/2020  Medication Sig  . acetaminophen (TYLENOL) 500 MG tablet Take 1,000 mg by mouth every 6 (six) hours as needed for moderate pain.   . Ascorbic Acid (VITAMIN C) 1000 MG tablet Take 1,000 mg by mouth daily.  Marland Kitchen atorvastatin (LIPITOR) 20 MG tablet Take 1 tablet (20 mg total) by mouth daily.  . calcium carbonate (OS-CAL) 600 MG TABS Take 600 mg by mouth 2 (two) times daily with a meal.    . Carboxymethylcellulose Sodium (REFRESH OP) Apply 1 drop to eye 2 (two) times daily as needed. For dry eyes  . Cyanocobalamin (VITAMIN B-12) 2500 MCG SUBL Place 1 tablet under the tongue daily.  . fish oil-omega-3 fatty acids 1000 MG capsule Take 2 g by mouth every evening.   . gabapentin (NEURONTIN) 100 MG capsule Take 1 pill at bedtime as needed for leg pain.  Marland Kitchen glucose blood (CONTOUR NEXT TEST) test strip Use as directed to check blood  sugars 1 time per day dx: e11.65  . hydrochlorothiazide (HYDRODIURIL) 25 MG tablet Take 1 tablet (25 mg total) by mouth daily.  Marland Kitchen losartan (COZAAR) 100 MG tablet Take 1 tablet (100 mg total) by mouth daily.  . Magnesium 400 MG CAPS Take by mouth daily.   . metoprolol succinate (TOPROL-XL) 25 MG 24 hr tablet Take 1 tablet (25 mg total) by mouth daily.  . Microlet Lancets MISC Use as directed to check blood sugars 1 time per day dx: e11.65  . Vitamin D, Cholecalciferol, 50 MCG (2000 UT) CAPS Take by mouth.    No facility-administered encounter medications on file as of 11/23/2020.     ONCOLOGIC FAMILY HISTORY:  Family History  Problem Relation Age of Onset  . Brain cancer Father   . Heart attack Mother   . Breast cancer Sister 30  . Breast cancer Sister 57    GENETIC COUNSELING/TESTING: Done at [facility], negative  SOCIAL HISTORY:  Martha Pham is  single and lives alone in [place], [place].  Ms. Boyson is currently retired.  She denies any current or history of tobacco, alcohol, or illicit drug use.  She has been traveling to [place] to help with her grandson.  (April 2021 updated)   PHYSICAL EXAMINATION:  Vital Signs: Vitals:   11/23/20 1116  BP: 132/72  Pulse: 68  Resp: 18  Temp: (!) 97.5 F (36.4 C)  SpO2: 100%   Filed Weights   11/23/20 1116  Weight: 165 lb 3.2 oz (74.9 kg)   General: Well-nourished, well-appearing female in no acute distress.  Unaccompanied today.   HEENT: Head is normocephalic.  Pupils equal and reactive to light. Conjunctivae clear without exudate.  Sclerae anicteric. Oral mucosa is pink, moist.  Oropharynx is pink without lesions or erythema.  Lymph: No cervical, supraclavicular, or infraclavicular lymphadenopathy noted on palpation.  Cardiovascular: Regular rate and rhythm.Marland Kitchen Respiratory: Clear to auscultation bilaterally. Chest expansion symmetric; breathing non-labored.  Breast Exam:  -Left breast: s/p mastectomy and reconstruction no  sign of local recurrence -right breast: s/p reduction, scars present, but no nodules/masses or signs of cancer. -Axilla: No axillary adenopathy bilaterally.  GI: Abdomen soft and round; non-tender, non-distended. Bowel sounds normoactive. No hepatosplenomegaly.   GU: Deferred.  Neuro: No focal deficits. Steady gait.  Psych: Mood and affect normal and appropriate for situation.  MSK: No focal spinal tenderness to palpation, full range of motion in bilateral upper extremities. Extremities: No edema. Skin: Warm and dry. Multiple nevi, several seborrheic keratoses on skin as well  LABORATORY DATA:  None for this visit   DIAGNOSTIC IMAGING:  Most recent mammogram:  CLINICAL DATA:  Screening.  EXAM: DIGITAL SCREENING UNILATERAL RIGHT MAMMOGRAM WITH CAD AND TOMO  COMPARISON:  Previous exam(s).  ACR Breast Density Category b: There are scattered areas of fibroglandular density.  FINDINGS: The patient has had a left mastectomy. There are no findings suspicious for malignancy. The images were evaluated with computer-aided detection.  IMPRESSION: No mammographic evidence of malignancy. A result letter of this screening mammogram will be mailed directly to the patient.  RECOMMENDATION: Screening mammogram in one year.  (Code:SM-R-56M)  BI-RADS CATEGORY  1: Negative.   Electronically Signed   By: Dorise Bullion III M.D   On: 08/22/2020 12:17     ASSESSMENT AND PLAN:  Ms.. Milius is a pleasant 70 y.o. female with history of Stage IIA left breast invasive ductal carcinoma, ER-/PR-/HER2-, diagnosed in 05/2011, treated with lumpectomy followed by mastectomy, and adjuvant chemotherapy.  [facility] and routine follow-up.   1. History of breast cancer:  Ms. Lovan is currently clinically and radiographically without evidence of disease or recurrence of breast cancer. She will be due for mammogram in 08/2021.  We will see her back in  one year for LTS surveillance and monitoring.  I encouraged her to call me with any questions or concerns before her next visit at the cancer center, and I would be happy to see her sooner, if needed.    2. Abnormal nevi: She plans on following up with Dr. Claudia Desanctis about this soon.  3. Bone health:  Given Ms. Ramone age, history of breast cancer, she is at risk for bone demineralization. She was given education on specific food and activities to promote bone health.  4. Cancer screening:  Due to Ms. Slee's history and her age, she should receive screening for skin cancers, colon cancer, and gynecologic cancers. She was encouraged to follow-up with  her PCP for appropriate cancer screenings.   5. Health maintenance and wellness promotion: Ms. Ruffini was encouraged to consume 5-7 servings of fruits and vegetables per day. She was also encouraged to engage in moderate to vigorous exercise for 30 minutes per day most days of the week. She was instructed to limit her alcohol consumption and continue to abstain from tobacco use.   Dispo:  -Return to cancer center in one year for LTS follow up -Mammogram in 08/2021 when due  Total encounter time: 20 minutes* spent in face to face visit time, reviewing radiology results, placing orders, and in documentation.  Gardenia Phlegm, NP [facility] [place] [phone]  *Total Encounter Time as defined by the Centers for Medicare and Medicaid Services includes, in addition to the face-to-face time of a patient visit (documented in the note above) non-face-to-face time: obtaining and reviewing outside history, ordering and reviewing medications, tests or procedures, care coordination (communications with other health care professionals or caregivers) and documentation in the medical record.   Note: PRIMARY CARE PROVIDER [place], [place] [phone]

## 2020-11-27 ENCOUNTER — Ambulatory Visit: Payer: PPO | Admitting: Internal Medicine

## 2020-11-28 DIAGNOSIS — G4733 Obstructive sleep apnea (adult) (pediatric): Secondary | ICD-10-CM | POA: Diagnosis not present

## 2020-12-03 ENCOUNTER — Telehealth: Payer: Self-pay

## 2020-12-03 NOTE — Chronic Care Management (AMB) (Signed)
Called patient for an appointment reminder with Martha Pham, [place] on 12-04-2020 at 3:30. Instructed patient to have all meds/supplements and logs available for review. Patient voiced understanding.  [place]  [phone]

## 2020-12-04 ENCOUNTER — Ambulatory Visit (INDEPENDENT_AMBULATORY_CARE_PROVIDER_SITE_OTHER): Payer: PPO

## 2020-12-04 DIAGNOSIS — E782 Mixed hyperlipidemia: Secondary | ICD-10-CM

## 2020-12-04 DIAGNOSIS — I10 Essential (primary) hypertension: Secondary | ICD-10-CM | POA: Diagnosis not present

## 2020-12-04 NOTE — Progress Notes (Signed)
Chronic Care Management Pharmacy Note  12/18/2020 Name:  Martha Pham MRN:  [id number] DOB:  03-04-1951  Subjective: Martha Pham is an 70 y.o. year old female who is a primary patient of Glendale Chard, MD.  The CCM team was consulted for assistance with disease management and care coordination needs.  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   Engaged with patient by telephone for follow up visit in response to provider referral for pharmacy case management and/or care coordination services.   Consent to Services:  The patient was given information about Chronic Care Management services, agreed to services, and gave verbal consent prior to initiation of services.  Please see initial visit note for detailed documentation.   Patient Care Team: Glendale Chard, MD as PCP - General (Internal Medicine) Mayford Knife, [facility] (Pharmacist) Gardenia Phlegm, NP as Nurse Practitioner (Hematology and Oncology) Star Age, MD as Attending Physician (Neurology)  Recent office visits: 11/13/2020 PCP OV  Recent consult visits: 11/23/2020 Oncology OV   11/13/2020 Neurology consult   Hospital visits: None in previous 6 months  Objective:  Lab Results  Component Value Date   CREATININE 1.07 (H) 11/13/2020   BUN 18 11/13/2020   GFRNONAA 56 (L) 05/30/2020   GFRAA 65 05/30/2020   NA 140 11/13/2020   K 3.7 11/13/2020   CALCIUM 10.2 11/13/2020   CO2 22 11/13/2020   GLUCOSE 111 (H) 11/13/2020    Lab Results  Component Value Date/Time   HGBA1C 6.1 (H) 11/13/2020 02:59 PM   HGBA1C 6.0 (H) 05/30/2020 10:12 AM   MICROALBUR 10 05/30/2020 10:12 AM    Last diabetic Eye exam:  Lab Results  Component Value Date/Time   HMDIABEYEEXA No Retinopathy 06/06/2020 12:00 AM    Last diabetic Foot exam: No results found for: HMDIABFOOTEX   Lab Results  Component Value Date   CHOL 170 05/30/2020   HDL 64 05/30/2020   LDLCALC 92  05/30/2020   TRIG 73 05/30/2020   CHOLHDL 2.7 05/30/2020    Hepatic Function Latest Ref Rng & Units 11/13/2020 05/30/2020 11/21/2019  Total Protein 6.0 - 8.5 g/dL 8.0 7.5 7.3  Albumin 3.7 - 4.7 g/dL 4.7 4.4 4.6  AST 0 - 40 IU/L _0 ALT 0 - 32 IU/L _1 Alk Phosphatase 44 - 121 IU/L 73 85 87  Total Bilirubin 0.0 - 1.2 mg/dL 0.4 0.5 0.6    Lab Results  Component Value Date/Time   TSH 0.983 05/12/2019 10:04 AM   TSH 0.845 01/20/2019 10:51 AM   FREET4 1.26 05/12/2019 10:04 AM    CBC Latest Ref Rng & Units 05/30/2020 05/12/2019 01/20/2019  WBC 3.4 - 10.8 x10E3/uL 5.8 5.9 5.2  Hemoglobin 11.1 - 15.9 g/dL 12.0 13.2 12.5  Hematocrit 34.0 - 46.6 % 36.2 39.5 37.7  Platelets 150 - 450 x10E3/uL 209 199 183    Lab Results  Component Value Date/Time   VD25OH 56 09/12/2013 09:37 AM    Clinical ASCVD: No  The 10-year ASCVD risk score Mikey Bussing DC Jr., et al., 2013) is: 27.3%   Values used to calculate the score:     Age: 79 years     Sex: Female     Is Non-Hispanic African American: Yes     Diabetic: Yes     Tobacco smoker: No     Systolic Blood Pressure: 160 mmHg     Is BP treated:  Yes     HDL Cholesterol: 64 mg/dL     Total Cholesterol: 170 mg/dL    Depression screen [facility] 2/9 05/30/2020 05/30/2020 05/12/2019  Decreased Interest 0 0 0  Down, Depressed, Hopeless 0 0 1  PHQ - 2 Score 0 0 1  Altered sleeping - - -  Tired, decreased energy - - -  Change in appetite - - -  Feeling bad or failure about yourself  - - -  Trouble concentrating - - -  Moving slowly or fidgety/restless - - -  Suicidal thoughts - - -  PHQ-9 Score - - -  Difficult doing work/chores - - -  Some recent data might be hidden     Social History   Tobacco Use  Smoking Status Never Smoker  Smokeless Tobacco Never Used   BP Readings from Last 3 Encounters:  11/23/20 132/72  11/13/20 126/66  11/13/20 (!) 151/72   Pulse Readings from Last 3 Encounters:  11/23/20 68  11/13/20 68  11/13/20 75    Wt Readings from Last 3 Encounters:  11/23/20 165 lb 3.2 oz (74.9 kg)  11/13/20 167 lb (75.8 kg)  11/13/20 167 lb (75.8 kg)   BMI Readings from Last 3 Encounters:  11/23/20 33.37 kg/m  11/13/20 33.73 kg/m  11/13/20 33.73 kg/m    Assessment/Interventions: Review of patient past medical history, allergies, medications, health status, including review of consultants reports, laboratory and other test data, was performed as part of comprehensive evaluation and provision of chronic care management services.   SDOH:  (Social Determinants of Health) assessments and interventions performed: No  SDOH Screenings   Alcohol Screen: Not on file  Depression (PHQ2-9): Low Risk   . PHQ-2 Score: 0  Financial Resource Strain: Low Risk   . Difficulty of Paying Living Expenses: Not hard at all  Food Insecurity: No Food Insecurity  . Worried About Charity fundraiser in the Last Year: Never true  . Ran Out of Food in the Last Year: Never true  Housing: Not on file  Physical Activity: Inactive  . Days of Exercise per Week: 0 days  . Minutes of Exercise per Session: 0 min  Social Connections: Not on file  Stress: No Stress Concern Present  . Feeling of Stress : Not at all  Tobacco Use: Low Risk   . Smoking Tobacco Use: Never Smoker  . Smokeless Tobacco Use: Never Used  Transportation Needs: No Transportation Needs  . Lack of Transportation (Medical): No  . Lack of Transportation (Non-Medical): No    CCM Care Plan  Allergies  Allergen Reactions  . Dust Mite Extract Itching  . Other     grass    Medications Reviewed Today    Reviewed by Mayford Knife, [facility] (Pharmacist) on 12/04/20 at 1544  Med List Status: <None>  Medication Order Taking? Sig Documenting Provider Last Dose Status Informant  acetaminophen (TYLENOL) 500 MG tablet [id number] Yes Take 1,000 mg by mouth every 6 (six) hours as needed for moderate pain.  [provider] Taking Active Self  Ascorbic Acid  (VITAMIN C) 1000 MG tablet [id number] Yes Take 1,000 mg by mouth daily. [provider] Taking Active Self  atorvastatin (LIPITOR) 20 MG tablet [id number] Yes Take 1 tablet (20 mg total) by mouth daily. Glendale Chard, MD Taking Active   calcium carbonate (OS-CAL) 600 MG TABS [id number] Yes Take 600 mg by mouth 2 (two) times daily with a meal.   [provider] Taking Active Self  Carboxymethylcellulose  Sodium (REFRESH OP) [id number] Yes Apply 1 drop to eye 2 (two) times daily as needed. For dry eyes [provider] Taking Active Self  Cyanocobalamin (VITAMIN B-12) 2500 MCG SUBL [id number] Yes Place 1 tablet under the tongue daily. [provider] Taking Active Self  fish oil-omega-3 fatty acids 1000 MG capsule [id number] Yes Take 2 g by mouth every evening.  [provider] Taking Active Self  gabapentin (NEURONTIN) 100 MG capsule [id number] Yes Take 1 pill at bedtime as needed for leg pain. Ward Givens, NP Taking Active   glucose blood (CONTOUR NEXT TEST) test strip [id number] Yes Use as directed to check blood sugars 1 time per day dx: e11.65 Glendale Chard, MD Taking Active   hydrochlorothiazide (HYDRODIURIL) 25 MG tablet [id number] Yes Take 1 tablet (25 mg total) by mouth daily. Glendale Chard, MD Taking Active   losartan (COZAAR) 100 MG tablet [id number]  Take 1 tablet (100 mg total) by mouth daily. Glendale Chard, MD  Expired 11/20/20 2359   Magnesium 400 MG CAPS [id number] Yes Take by mouth daily.  [provider] Taking Active   metoprolol succinate (TOPROL-XL) 25 MG 24 hr tablet [id number] Yes Take 1 tablet (25 mg total) by mouth daily. Glendale Chard, MD Taking Active   Microlet Lancets [place] [id number] Yes Use as directed to check blood sugars 1 time per day dx: e11.65 Glendale Chard, MD Taking Active   Vitamin D, Cholecalciferol, 50 MCG (2000 UT) CAPS [id number] Yes Take by mouth.  [provider] Taking Active           Patient Active  Problem List   Diagnosis Date Noted  . History of breast cancer 11/23/2020  . Diabetes mellitus with stage 2 chronic kidney disease ([place]) 06/22/2018  . Chronic renal disease, stage II 06/22/2018  . Hypertensive nephropathy 06/22/2018  . Adult BMI 29.0-29.9 kg/sq m 06/22/2018  . Pain in joint, pelvic region and thigh 03/28/2014  . Acquired absence of breast and absent nipple 05/05/2012    Immunization History  Administered Date(s) Administered  . Fluad Quad(high Dose 65+) 05/30/2020  . Influenza, High Dose Seasonal PF 05/05/2018, 04/14/2019  . Influenza,inj,Quad PF,6+ Mos 04/04/2014  . PFIZER(Purple Top)SARS-COV-2 Vaccination 11/03/2019, 11/28/2019, 07/08/2020  . Pneumococcal Polysaccharide-23 05/12/2019    Conditions to be addressed/monitored:  Hypertension and Hyperlipidemia  Care Plan : [place]  Updates made by Mayford Knife, RPH since 12/18/2020 12:00 AM    Problem: HTN, HLD   Priority: High  Onset Date: 10/30/2020    Long-Range Goal: Disease Management   Start Date: 10/30/2020  Recent Progress: On track  Priority: High  Note:   Current Barriers:   Unable to independently monitor therapeutic efficacy     Pharmacist Clinical Goal(s):   Patient will verbalize ability to afford treatment regimen through collaboration with PharmD and provider.      Interventions:  1:1 collaboration with Glendale Chard, MD regarding development and update of comprehensive plan of care as evidenced by provider attestation and co-signature  Inter-disciplinary care team collaboration (see longitudinal plan of care)  Comprehensive medication review performed; medication list updated in electronic medical record   Hypertension (BP goal <140/90) -Controlled -Current treatment:  Hydrochlorothiazide 25 mg tablet once per day  Losartan 100 mg tablet once per day   Metoprolol Succinate 25 mg taking 1 tablet daily  -Current home readings:  -Current dietary habits:  148/78  -Current exercise habits: please see hyperlipidemia  -Denies hypotensive/hypertensive symptoms -Educated on Daily salt intake goal <  2300 mg; Exercise goal of 150 minutes per week; Importance of home blood pressure monitoring; -Counseled to monitor BP at home at ;[place] twice per week, document, and provide log at future appointments -Recommended to continue current medication   Hyperlipidemia: (LDL goal < 100) -Controlled -Current treatment:  Atorvastatin 20 mg - taking 1 tablet by mouth daily.  -Current dietary patterns: she has stopped keeping starches, wheat and bread in the house.  -Current exercise habits: she makes sure that she is doing a certain amount of steps per day. She was exercising three times a week at the [facility]. She is currently getting 7000-8000 steps per day.  She makes sure to keep the cart far so she walks more. She is going to start going to the [facility] on the days that she is not working.  -Educated on Cholesterol goals;  Benefits of statin for ASCVD risk reduction; Importance of limiting foods high in cholesterol; -Recommended to continue current medication   Health Maintenance -Vaccine gaps:  1. COVID -19 Booster (4th dose)  ? Used NCIR system to identify dates of patients last vaccine doses: 07/08/2020 -Educated on the importance of the 4th booster shot.  -Patient reports that she will get [employer] booster in May, she is going to schedule her appointment sometime this week.   -Collaborated with PCP team to add booster shot from 07/08/2020 in patients chart.      Patient Goals/Self-Care Activities  Patient will:  - take medications as prescribed  -Patient had Tb test placed on Monday she wanted to know when the test should be read.     -Explained to patient that test should be read between 48-72 hours after placement.    Follow Up Plan:  The patient has been provided with contact information for the care management team and has been advised to call  with any health related questions or concerns.       Medication Assistance: None required.  Patient affirms current coverage meets needs.  Patient's preferred pharmacy is:  [facility], [place] [address] [place] [address] Phone: [phone] Fax: [facility] [address] [place] [address] [address] [place] [address] Phone: [phone] Fax: [phone]  Uses pill box? Yes Pt endorses 90% compliance  We discussed: Benefits of medication synchronization, packaging and delivery as well as enhanced pharmacist oversight with [employer]. Patient decided to: Continue current medication management strategy  Care Plan and Follow Up Patient Decision:  Patient agrees to Care Plan and Follow-up.  Plan: The patient has been provided with contact information for the care management team and has been advised to call with any health related questions or concerns.   [name], PharmD Clinical Pharmacist [facility] [phone]

## 2020-12-18 NOTE — Patient Instructions (Signed)
Visit Information It was great speaking with you today!  Please let me know if you have any questions about our visit.  Goals Addressed            This Visit's Progress   . Track and Manage My Blood Pressure-Hypertension       Timeframe:  Long-Range Goal Priority:  High Start Date:                             Expected End Date:                       Follow Up Date 04/09/2021 - check blood pressure 3 times per week - choose a place to take my blood pressure (home, clinic or office, retail store) - write blood pressure results in a log or diary    Why is this important?    You won't feel high blood pressure, but it can still hurt your blood vessels.   High blood pressure can cause heart or kidney problems. It can also cause a stroke.   Making lifestyle changes like losing a little weight or eating less salt will help.   Checking your blood pressure at home and at different times of the day can help to control blood pressure.   If the doctor prescribes medicine remember to take it the way the doctor ordered.   Call the office if you cannot afford the medicine or if there are questions about it.            Patient Care Plan: CCM Pharmacy Care Plan    Problem Identified: HTN, HLD   Priority: High  Onset Date: 10/30/2020    Long-Range Goal: Disease Management   Start Date: 10/30/2020  Recent Progress: On track  Priority: High  Note:   Current Barriers:   Unable to independently monitor therapeutic efficacy     Pharmacist Clinical Goal(s):   Patient will verbalize ability to afford treatment regimen through collaboration with PharmD and provider.      Interventions:  1:1 collaboration with Glendale Chard, MD regarding development and update of comprehensive plan of care as evidenced by provider attestation and co-signature  Inter-disciplinary care team collaboration (see longitudinal plan of care)  Comprehensive medication review performed; medication list  updated in electronic medical record   Hypertension (BP goal <140/90) -Controlled -Current treatment:  Hydrochlorothiazide 25 mg tablet once per day  Losartan 100 mg tablet once per day   Metoprolol Succinate 25 mg taking 1 tablet daily  -Current home readings:  -Current dietary habits: 148/78  -Current exercise habits: please see hyperlipidemia  -Denies hypotensive/hypertensive symptoms -Educated on Daily salt intake goal < 2300 mg; Exercise goal of 150 minutes per week; Importance of home blood pressure monitoring; -Counseled to monitor BP at home at ;[place] twice per week, document, and provide log at future appointments -Recommended to continue current medication   Hyperlipidemia: (LDL goal < 100) -Controlled -Current treatment:  Atorvastatin 20 mg - taking 1 tablet by mouth daily.  -Current dietary patterns: she has stopped keeping starches, wheat and bread in the house.  -Current exercise habits: she makes sure that she is doing a certain amount of steps per day. She was exercising three times a week at the [facility]. She is currently getting 7000-8000 steps per day.  She makes sure to keep the cart far so she walks more. She is going to start going to the  [employer] on the days that she is not working.  -Educated on Cholesterol goals;  Benefits of statin for ASCVD risk reduction; Importance of limiting foods high in cholesterol; -Recommended to continue current medication   Health Maintenance -Vaccine gaps:  1. COVID -19 Booster (4th dose)  ? Used NCIR system to identify dates of patients last vaccine doses: 07/08/2020 -Educated on the importance of the 4th booster shot.  -Patient reports that she will get [employer] booster in May, she is going to schedule her appointment sometime this week.   -Collaborated with PCP team to add booster shot from 07/08/2020 in patients chart.      Patient Goals/Self-Care Activities  Patient will:  - take medications as prescribed  -Patient had  Tb test placed on Monday she wanted to know when the test should be read.     -Explained to patient that test should be read between 48-72 hours after placement.    Follow Up Plan:  The patient has been provided with contact information for the care management team and has been advised to call with any health related questions or concerns.       Patient agreed to services and verbal consent obtained.   The patient verbalized understanding of instructions, educational materials, and care plan provided today and agreed to receive a mailed copy of patient instructions, educational materials, and care plan.   Martha Pham, PharmD Clinical Pharmacist [facility] [phone]

## 2020-12-29 DIAGNOSIS — G4733 Obstructive sleep apnea (adult) (pediatric): Secondary | ICD-10-CM | POA: Diagnosis not present

## 2021-01-14 ENCOUNTER — Other Ambulatory Visit: Payer: Self-pay | Admitting: Internal Medicine

## 2021-01-16 ENCOUNTER — Other Ambulatory Visit: Payer: Self-pay

## 2021-01-16 ENCOUNTER — Encounter: Payer: Self-pay | Admitting: Internal Medicine

## 2021-01-16 ENCOUNTER — Emergency Department (HOSPITAL_COMMUNITY)
Admission: EM | Admit: 2021-01-16 | Discharge: 2021-01-16 | Disposition: A | Discharge status: Home / Self Care | Payer: PPO | Attending: Emergency Medicine | Admitting: Emergency Medicine

## 2021-01-16 ENCOUNTER — Encounter (HOSPITAL_COMMUNITY): Payer: Self-pay

## 2021-01-16 DIAGNOSIS — Z853 Personal history of malignant neoplasm of breast: Secondary | ICD-10-CM | POA: Insufficient documentation

## 2021-01-16 DIAGNOSIS — E1122 Type 2 diabetes mellitus with diabetic chronic kidney disease: Secondary | ICD-10-CM | POA: Diagnosis not present

## 2021-01-16 DIAGNOSIS — Z20822 Contact with and (suspected) exposure to covid-19: Secondary | ICD-10-CM

## 2021-01-16 DIAGNOSIS — N182 Chronic kidney disease, stage 2 (mild): Secondary | ICD-10-CM | POA: Insufficient documentation

## 2021-01-16 DIAGNOSIS — H1132 Conjunctival hemorrhage, left eye: Secondary | ICD-10-CM | POA: Diagnosis not present

## 2021-01-16 DIAGNOSIS — Z79899 Other long term (current) drug therapy: Secondary | ICD-10-CM | POA: Diagnosis not present

## 2021-01-16 DIAGNOSIS — I129 Hypertensive chronic kidney disease with stage 1 through stage 4 chronic kidney disease, or unspecified chronic kidney disease: Secondary | ICD-10-CM | POA: Diagnosis not present

## 2021-01-16 DIAGNOSIS — J449 Chronic obstructive pulmonary disease, unspecified: Secondary | ICD-10-CM | POA: Insufficient documentation

## 2021-01-16 DIAGNOSIS — R059 Cough, unspecified: Secondary | ICD-10-CM | POA: Diagnosis present

## 2021-01-16 DIAGNOSIS — J069 Acute upper respiratory infection, unspecified: Secondary | ICD-10-CM | POA: Diagnosis not present

## 2021-01-16 LAB — SARS CORONAVIRUS 2 (TAT 6-24 HRS): SARS Coronavirus 2: NEGATIVE

## 2021-01-16 NOTE — ED Provider Notes (Signed)
[facility] DEPT Provider Note   CSN: [id number] Arrival date & time: 01/16/21  0831     History No chief complaint on file.   Martha Pham is a 70 y.o. female.  HPI     70 year old female with a history of hypertension, hyperlipidemia, COPD, lung cancer, breast cancer presents with concern for red eye. Dr. Sherral Hammers is eye doctor, cataract surgery beginning of this year Cough and congestion for 2 days, yellow-green mucus, lots of nose blowing No fevers, no shortness of breath or chest pain No visual changes, eye discharge or crusting, no eye pain Works in a daycare   Past Medical History:  Diagnosis Date   Apnea, sleep    does wear cpap   Arthritis    Cancer ([place])    lt. breast ca-snbx   Chronic kidney disease    Contact lens/glasses fitting    wears contacts or glasses   COPD (chronic obstructive pulmonary disease) ([place])    brother   Heart murmur    Hypercholesteremia    Hyperlipemia    Hypertension    Lower back pain    Lung cancer ([place])    Malaise and fatigue    Personal history of chemotherapy 2013   Sleep apnea    SLEEP STUDY DX 10,CPAP BUT DON'TUSE   Vitamin D deficiency    Wears dentures    upper    Patient Active Problem List   Diagnosis Date Noted   History of breast cancer 11/23/2020   Diabetes mellitus with stage 2 chronic kidney disease ([place]) 06/22/2018   Chronic renal disease, stage II 06/22/2018   Hypertensive nephropathy 06/22/2018   Adult BMI 29.0-29.9 kg/sq m 06/22/2018   Pain in joint, pelvic region and thigh 03/28/2014   Acquired absence of breast and absent nipple 05/05/2012    Past Surgical History:  Procedure Laterality Date   BREAST BIOPSY Left 05/26/2011   malignant   BREAST BIOPSY Right 10/29/2016   BREAST IMPLANT EXCHANGE Left 09/23/2012   Procedure: REMOVAL OF LEFT EXPANDER/PLACEMENT OF IMPLANT WITH REDUCTION OF RIGHT BREAST;  Surgeon: Theodoro Kos, DO;  Location: [facility];   Service: Plastics;  Laterality: Left;   BREAST LUMPECTOMY W/ NEEDLE LOCALIZATION  06/24/2011   left   BREAST REDUCTION SURGERY Right 09/23/2012   Procedure: MAMMARY REDUCTION  (BREAST);  Surgeon: Theodoro Kos, DO;  Location: [facility];  Service: Plastics;  Laterality: Right;   BREAST SURGERY  08/07/2011   re-exc. left breast margins   CYST REMOVAL NECK  08/2013   Dr. Migdalia Dk   LIPOSUCTION Bilateral 09/23/2012   Procedure: LIPOSUCTION;  Surgeon: Theodoro Kos, DO;  Location: [facility];  Service: Plastics;  Laterality: Bilateral;   MASTECTOMY Left 2012   MOUTH SURGERY     TOOTH IMPLANT BOTTOM X2 LFT   PORT-A-CATH REMOVAL Right 09/23/2012   Procedure: REMOVAL PORT-A-CATH;  Surgeon: Theodoro Kos, DO;  Location: [facility];  Service: Plastics;  Laterality: Right;   PORTACATH PLACEMENT  06/24/2011   Procedure: INSERTION PORT-A-CATH;  Surgeon: Judieth Keens, DO;  Location: [facility];  Service: General;  Laterality: Right;  Right mediport placement   RECONSTRUCTION BREAST IMMEDIATE / DELAYED W/ TISSUE EXPANDER  10/13   left   REDUCTION MAMMAPLASTY Right 2013   SIMPLE MASTECTOMY  09/01/2011   left   TONSILLECTOMY       OB History   No obstetric history on file.     Family  History  Problem Relation Age of Onset   Brain cancer Father    Heart attack Mother    Breast cancer Sister 43   Breast cancer Sister 31    Social History   Tobacco Use   Smoking status: Never   Smokeless tobacco: Never  Vaping Use   Vaping Use: Never used  Substance Use Topics   Alcohol use: Not Currently    Alcohol/week: 2.0 standard drinks    Types: 2 Glasses of wine per week    Comment: 8 oz occasionally per week   Drug use: No    Home Medications Prior to Admission medications   Medication Sig Start Date End Date Taking? Authorizing Provider  acetaminophen (TYLENOL) 500 MG tablet Take 1,000 mg by mouth every 6 (six) hours as needed for  moderate pain.     [provider]  Ascorbic Acid (VITAMIN C) 1000 MG tablet Take 1,000 mg by mouth daily.    [provider]  atorvastatin (LIPITOR) 20 MG tablet Take 1 tablet (20 mg total) by mouth daily. 11/21/19   Glendale Chard, MD  calcium carbonate (OS-CAL) 600 MG TABS Take 600 mg by mouth 2 (two) times daily with a meal.      [provider]  Carboxymethylcellulose Sodium (REFRESH OP) Apply 1 drop to eye 2 (two) times daily as needed. For dry eyes    [provider]  Cyanocobalamin (VITAMIN B-12) 2500 MCG SUBL Place 1 tablet under the tongue daily.    [provider]  fish oil-omega-3 fatty acids 1000 MG capsule Take 2 g by mouth every evening.     [provider]  gabapentin (NEURONTIN) 100 MG capsule Take 1 pill at bedtime as needed for leg pain. 11/13/20   Ward Givens, NP  glucose blood (CONTOUR NEXT TEST) test strip Use as directed to check blood sugars 1 time per day dx: e11.65 11/06/20   Glendale Chard, MD  hydrochlorothiazide (HYDRODIURIL) 25 MG tablet Take 1 tablet (25 mg total) by mouth daily. 11/21/19   Glendale Chard, MD  losartan (COZAAR) 100 MG tablet Take 1 tablet by mouth once daily 01/15/21   Glendale Chard, MD  Magnesium 400 MG CAPS Take by mouth daily.     [provider]  metoprolol succinate (TOPROL-XL) 25 MG 24 hr tablet Take 1 tablet (25 mg total) by mouth daily. 11/21/19   Glendale Chard, MD  Microlet Lancets MISC Use as directed to check blood sugars 1 time per day dx: e11.65 11/06/20   Glendale Chard, MD  Vitamin D, Cholecalciferol, 50 MCG (2000 UT) CAPS Take by mouth.     [provider]    Allergies    Dust mite extract and Other  Review of Systems   Review of Systems  Constitutional:  Negative for fever.  HENT:  Positive for congestion and rhinorrhea. Negative for sore throat.   Eyes:  Positive for redness. Negative for photophobia, pain, discharge, itching and visual disturbance.   Respiratory:  Positive for cough. Negative for shortness of breath.   Cardiovascular:  Negative for chest pain.  Musculoskeletal:  Negative for myalgias.  Skin:  Negative for rash.  Neurological:  Negative for headaches.   Physical Exam Updated Vital Signs BP (!) 156/85 (BP Location: Right Arm)   Pulse 94   Temp 97.9 F (36.6 C) (Oral)   Resp 16   Ht 5' (1.524 m)   Wt 72.6 kg   SpO2 100%   BMI 31.25 kg/m   Physical  Exam Vitals and nursing note reviewed.  Constitutional:      General: She is not in acute distress.    Appearance: She is well-developed. She is not diaphoretic.  HENT:     Head: Normocephalic and atraumatic.     Right Ear: Tympanic membrane normal.     Left Ear: Tympanic membrane normal.  Eyes:     Extraocular Movements: Extraocular movements intact.     Pupils: Pupils are equal, round, and reactive to light.     Comments: Redness medial portion of left eye consistent with subconjunctival hemorrhage  Cardiovascular:     Rate and Rhythm: Normal rate and regular rhythm.     Heart sounds: Normal heart sounds. No murmur heard.   No friction rub. No gallop.  Pulmonary:     Effort: Pulmonary effort is normal. No respiratory distress.     Breath sounds: Normal breath sounds. No wheezing or rales.  Abdominal:     General: There is no distension.     Palpations: Abdomen is soft.     Tenderness: There is no abdominal tenderness. There is no guarding.  Musculoskeletal:        General: No tenderness.     Cervical back: Normal range of motion.  Skin:    General: Skin is warm and dry.     Findings: No erythema or rash.  Neurological:     Mental Status: She is alert and oriented to person, place, and time.    ED Results / Procedures / Treatments   Labs (all labs ordered are listed, but only abnormal results are displayed) Labs Reviewed  SARS CORONAVIRUS 2 (TAT 6-24 HRS)    EKG None  Radiology No results found.  Procedures Procedures   Medications  Ordered in ED Medications - No data to display  ED Course  I have reviewed the triage vital signs and the nursing notes.  Pertinent labs & imaging results that were available during my care of the patient were reviewed by me and considered in my medical decision making (see chart for details).    MDM Rules/Calculators/A&P                           70 year old female with a history of hypertension, hyperlipidemia, COPD, lung cancer, breast cancer presents with concern for red eye.  Exam consistent with subconjunctival hemorrhage, likely secondary to coughing and nose blowing.  History and exam are not consistent with conjunctivitis, corneal abrasion, acute angle-closure glaucoma, orbital or periorbital cellulitis, retinal detachment, CRAO or CVRO.    Recommend continued supportive care.  For URI symptoms, sent COVID 19 testing and recommend continued supportive care. Given duration of symptoms have low suspicion for bacterial infection/.sinsusitis at this time.  Patient discharged in stable condition with understanding of reasons to return.  Final Clinical Impression(s) / ED Diagnoses Final diagnoses:  Subconjunctival hemorrhage of left eye  Upper respiratory tract infection, unspecified type  COVID-19 virus test result unknown    Rx / DC Orders ED Discharge Orders     None        Gareth Morgan, MD 01/17/21 [phone]

## 2021-01-16 NOTE — Discharge Instructions (Addendum)
You have findings on exam consistent with subconjunctival hemorrhage likely related to your coughing, nose blowing and possibly elevated blood pressure. This will improve with time and does not require specific treatment.  Your symptoms of cough and congestion at this time are likely related to a viral infection, with COVID 19 being a possibility. Your test will be back in 6-24hr, if positive, you can discuss oral antiviral options with your primary doctor.

## 2021-01-16 NOTE — ED Triage Notes (Signed)
Patient c/o left eye redness. Noticed when she woke up this morning. Reports runny nose yesterday and blowing her nose frequently. Also reports noticing some blood from the left nostril after blowing her nose.    Hx: hypertension and took her medication today  Denies trauma, injury, or falling.  A/Ox4 Ambulatory

## 2021-01-17 ENCOUNTER — Telehealth: Payer: Self-pay

## 2021-01-17 NOTE — Telephone Encounter (Signed)
Transition Care Management Follow-up Telephone Call Date of discharge and from where: [facility]  How have you been since you were released from the hospital?  Pt states she is good, she feels fine. She does not feel the need of an appointment for concern of her left eye being what was told to her at the hospital, it will go away on its own. Pt stated if she feels as if the condition worsens she will call to make an appointment, but she did state at the time a HPFU was not necessary.  Any questions or concerns? No  Items Reviewed: Did the pt receive and understand the discharge instructions provided? Yes  Medications obtained and verified? Yes  Other? No  Any new allergies since your discharge? No  Dietary orders reviewed? Yes Do you have support at home? Yes   Home Care and Equipment/Supplies: Were home health services ordered? not applicable If so, what is the name of the agency? N/a  Has the agency set up a time to come to the patient's home? not applicable Were any new equipment or medical supplies ordered?  No What is the name of the medical supply agency? N/a Were you able to get the supplies/equipment? not applicable Do you have any questions related to the use of the equipment or supplies? No  Functional Questionnaire: (I = Independent and D = Dependent) ADLs: I  Bathing/Dressing- I  Meal Prep- I  Eating- I  Maintaining continence- I  Transferring/Ambulation- I  Managing Meds- I  Follow up appointments reviewed:  PCP Hospital f/u appt confirmed? No  Scheduled to see N/A on N/A @ N/A. [facility] f/u appt confirmed? No  Scheduled to see N/A on N/A @ N/A. Are transportation arrangements needed? No  If their condition worsens, is the pt aware to call PCP or go to the Emergency Dept.? Yes Was the patient provided with contact information for the PCP's office or ED? Yes Was to pt encouraged to call back with questions or concerns? Yes

## 2021-01-22 ENCOUNTER — Encounter: Payer: Self-pay | Admitting: Internal Medicine

## 2021-01-22 ENCOUNTER — Ambulatory Visit (INDEPENDENT_AMBULATORY_CARE_PROVIDER_SITE_OTHER): Payer: PPO | Admitting: Internal Medicine

## 2021-01-22 ENCOUNTER — Other Ambulatory Visit: Payer: Self-pay

## 2021-01-22 VITALS — BP 124/60 | HR 78 | Temp 98.1°F | Ht 60.0 in | Wt 164.4 lb

## 2021-01-22 DIAGNOSIS — I129 Hypertensive chronic kidney disease with stage 1 through stage 4 chronic kidney disease, or unspecified chronic kidney disease: Secondary | ICD-10-CM | POA: Diagnosis not present

## 2021-01-22 DIAGNOSIS — R0982 Postnasal drip: Secondary | ICD-10-CM

## 2021-01-22 DIAGNOSIS — H1132 Conjunctival hemorrhage, left eye: Secondary | ICD-10-CM | POA: Diagnosis not present

## 2021-01-22 DIAGNOSIS — N182 Chronic kidney disease, stage 2 (mild): Secondary | ICD-10-CM | POA: Diagnosis not present

## 2021-01-22 DIAGNOSIS — R7309 Other abnormal glucose: Secondary | ICD-10-CM

## 2021-01-22 MED ORDER — LORATADINE 10 MG PO TABS
10.0000 mg | ORAL_TABLET | Freq: Every day | ORAL | 2 refills | Status: DC
Start: 2021-01-22 — End: 2023-05-28

## 2021-01-22 NOTE — Progress Notes (Signed)
I,Martha Pham,acting as a Education administrator for Martha Greenland, MD.,have documented all relevant documentation on the behalf of Martha Greenland, MD,as directed by  Martha Greenland, MD while in the presence of Martha Greenland, MD.  This visit occurred during the SARS-CoV-2 public health emergency.  Safety protocols were in place, including screening questions prior to the visit, additional usage of staff PPE, and extensive cleaning of exam room while observing appropriate contact time as indicated for disinfecting solutions.  Subjective:     Patient ID: Martha Pham , female    DOB: [birth year] , 70 y.o.   MRN: [id number]   Chief Complaint  Patient presents with   hospital f/u    HPI  The patient is here today for an ER f/u.  She presented to [facility] for further evaluation of "red eye" on 6/29. She states she awakened on the 29th with a red eye. There was no associated visual loss/defect or eye pain. She admits that she was up all night coughing/sneezing. She went to ER b/c of the time and did not want to wait on Urgent Care to open. She was diagnosed with subconjunctival hemorrhage. Her sx have since resolved. She does not have any visual complaints. She denies having any febrile sx along with URI sx.     Past Medical History:  Diagnosis Date   Apnea, sleep    does wear cpap   Arthritis    Cancer ([place])    lt. breast ca-snbx   Chronic kidney disease    Contact lens/glasses fitting    wears contacts or glasses   COPD (chronic obstructive pulmonary disease) ([facility])    brother   Heart murmur    Hypercholesteremia    Hyperlipemia    Hypertension    Lower back pain    Lung cancer ([place])    Malaise and fatigue    Personal history of chemotherapy 2013   Sleep apnea    SLEEP STUDY DX 10,CPAP BUT DON'TUSE   Vitamin D deficiency    Wears dentures    upper     Family History  Problem Relation Age of Onset   Brain cancer Father    Heart attack Mother    Breast cancer Sister 72   Breast  cancer Sister 19     Current Outpatient Medications:    acetaminophen (TYLENOL) 500 MG tablet, Take 1,000 mg by mouth every 6 (six) hours as needed for moderate pain. , Disp: , Rfl:    Ascorbic Acid (VITAMIN C) 1000 MG tablet, Take 1,000 mg by mouth daily., Disp: , Rfl:    atorvastatin (LIPITOR) 20 MG tablet, Take 1 tablet (20 mg total) by mouth daily., Disp: 90 tablet, Rfl: 2   calcium carbonate (OS-CAL) 600 MG TABS, Take 600 mg by mouth 2 (two) times daily with a meal.  , Disp: , Rfl:    Carboxymethylcellulose Sodium (REFRESH OP), Apply 1 drop to eye 2 (two) times daily as needed. For dry eyes, Disp: , Rfl:    Cyanocobalamin (VITAMIN B-12) 2500 MCG SUBL, Place 1 tablet under the tongue daily., Disp: , Rfl:    gabapentin (NEURONTIN) 100 MG capsule, Take 1 pill at bedtime as needed for leg pain., Disp: 90 capsule, Rfl: 3   glucose blood (CONTOUR NEXT TEST) test strip, Use as directed to check blood sugars 1 time per day dx: e11.65, Disp: 100 each, Rfl: 3   hydrochlorothiazide (HYDRODIURIL) 25 MG tablet, Take 1 tablet (25 mg total) by mouth daily.,  Disp: 90 tablet, Rfl: 2   loratadine (CLARITIN) 10 MG tablet, Take 1 tablet (10 mg total) by mouth daily., Disp: 30 tablet, Rfl: 2   losartan (COZAAR) 100 MG tablet, Take 1 tablet by mouth once daily, Disp: 90 tablet, Rfl: 0   Magnesium 400 MG CAPS, Take by mouth daily. , Disp: , Rfl:    metoprolol succinate (TOPROL-XL) 25 MG 24 hr tablet, Take 1 tablet (25 mg total) by mouth daily., Disp: 90 tablet, Rfl: 2   Microlet Lancets MISC, Use as directed to check blood sugars 1 time per day dx: e11.65, Disp: 100 each, Rfl: 3   Vitamin D, Cholecalciferol, 50 MCG (2000 UT) CAPS, Take by mouth. , Disp: , Rfl:    Allergies  Allergen Reactions   Dust Mite Extract Itching   Other     grass     Review of Systems  Constitutional: Negative.   HENT:  Positive for postnasal drip.   Respiratory: Negative.    Cardiovascular: Negative.   Gastrointestinal:  Negative.   Psychiatric/Behavioral: Negative.    All other systems reviewed and are negative.   Today's Vitals   01/22/21 1539  BP: 124/60  Pulse: 78  Temp: 98.1 F (36.7 C)  TempSrc: Oral  Weight: 164 lb 6.4 oz (74.6 kg)  Height: 5' (1.524 m)  PainSc: 0-No pain   Body mass index is 32.11 kg/m.  Wt Readings from Last 3 Encounters:  01/22/21 164 lb 6.4 oz (74.6 kg)  01/16/21 160 lb (72.6 kg)  11/23/20 165 lb 3.2 oz (74.9 kg)    BP Readings from Last 3 Encounters:  01/22/21 124/60  01/16/21 (!) 156/85  11/23/20 132/72    Objective:  Physical Exam Vitals and nursing note reviewed.  Constitutional:      Appearance: Normal appearance.  HENT:     Head: Normocephalic and atraumatic.     Nose:     Comments: Masked     Mouth/Throat:     Comments: Masked  Eyes:     Comments: Hemorrhage is no longer present in left eye  Cardiovascular:     Rate and Rhythm: Normal rate and regular rhythm.     Heart sounds: Normal heart sounds.  Pulmonary:     Effort: Pulmonary effort is normal.     Breath sounds: Normal breath sounds.  Musculoskeletal:     Cervical back: Normal range of motion.  Skin:    General: Skin is warm.  Neurological:     General: No focal deficit present.     Mental Status: She is alert.  Psychiatric:        Mood and Affect: Mood normal.        Behavior: Behavior normal.        Assessment And Plan:     1. Subconjunctival hemorrhage of left eye Comments: Resolving. ER records reviewed in full detail. All questions were answered to her satisfaction.   2. Postnasal drip Comments: She is encouraged to try loratadine 43m daily. She will let me know if her sx persist.   3. Parenchymal renal hypertension, stage 1 through stage 4 or unspecified chronic kidney disease Comments: Chronic, well controlled. She is encouraged to follow low sodium diet.   4. Chronic renal disease, stage II Comments: I will recheck renal function today. We had long discussion  regarding CKD, various etiologies and how to prevent progression.  - BMP8+eGFR  5. Abnormal glucose Comments: Her a1c has been elevated in the past. I will recheck this today.  She is encouraged to limit intake of sweetened drinks, including diet drinks.  - Hemoglobin A1c    Patient was given opportunity to ask questions. Patient verbalized understanding of the plan and was able to repeat key elements of the plan. All questions were answered to their satisfaction.   I, Martha Greenland, MD, have reviewed all documentation for this visit. The documentation on 01/22/21 for the exam, diagnosis, procedures, and orders are all accurate and complete.   IF YOU HAVE BEEN REFERRED TO A SPECIALIST, IT MAY TAKE 1-2 WEEKS TO SCHEDULE/PROCESS THE REFERRAL. IF YOU HAVE NOT HEARD FROM US/SPECIALIST IN TWO WEEKS, PLEASE GIVE [place] A CALL AT [phone].   THE PATIENT IS ENCOURAGED TO PRACTICE SOCIAL DISTANCING DUE TO THE COVID-19 PANDEMIC.

## 2021-01-22 NOTE — Patient Instructions (Signed)
Diabetes Mellitus Action Plan °Following a diabetes action plan is a way for you to manage your diabetes (diabetes mellitus) symptoms. The plan is color-coded to help you understand what actions you need to take based on any symptoms you are having. °If you have symptoms in the red zone, you need medical care right away. °If you have symptoms in the yellow zone, you are having problems. °If you have symptoms in the green zone, you are doing well. °Learning about and understanding diabetes can take time. Follow the plan that you develop with your health care provider. Know the target range for your blood sugar (glucose) level, and review your treatment plan with your health care provider at each visit. °The target range for my blood sugar level is __________________________ mg/dL. °Red zone °Get medical help right away if you have any of the following symptoms: °A blood sugar test result that is below 54 mg/dL (3 mmol/L). °A blood sugar test result that is at or above 240 mg/dL (13.3 mmol/L) for 2 days in a row. °Confusion or trouble thinking clearly. °Difficulty breathing. °Sickness or a fever for 2 or more days that is not getting better. °Moderate or large ketone levels in your urine. °Feeling tired or having no energy. °If you have any red zone symptoms, do not wait to see if the symptoms will go away. Get medical help right away. Call your local emergency services (911 in the [place] Do not drive yourself to the hospital. °If you have severely low blood sugar (severe hypoglycemia) and you cannot eat or drink, you may need glucagon. Make sure a family member or close friend knows how to check your blood sugar and how to give you glucagon. You may need to be treated in a hospital for this condition. °Yellow zone °If you have any of the following symptoms, your diabetes is not under control and you may need to make some changes: °A blood sugar test result that is at or above 240 mg/dL (13.3 mmol/L) for 2 days in a  row. °Blood sugar test results that are below 70 mg/dL (3.9 mmol/L). °Other symptoms of hypoglycemia, such as: °Shaking or feeling light-headed. °Confusion or irritability. °Feeling hungry. °Having a fast heartbeat. °If you have any yellow zone symptoms: °Treat your hypoglycemia by eating or drinking 15 grams of a rapid-acting carbohydrate. Follow the 15:15 rule: °Take 15 grams of a rapid-acting carbohydrate, such as: °1 tube of glucose gel. °4 glucose pills. °4 oz (120 mL) of fruit juice. °4 oz (120 mL) of regular (not diet) soda. °Check your blood sugar 15 minutes after you take the carbohydrate. °If the repeat blood sugar test is still at or below 70 mg/dL (3.9 mmol/L), take 15 grams of a carbohydrate again. °If your blood sugar does not increase above 70 mg/dL (3.9 mmol/L) after 3 tries, get medical help right away. °After your blood sugar returns to normal, eat a meal or a snack within 1 hour. °Keep taking your daily medicines as told by your health care provider. °Check your blood sugar more often than you normally would. °Write down your results. °Call your health care provider if you have trouble keeping your blood sugar in your target range. ° °Green zone °These signs mean you are doing well and you can continue what you are doing to manage your diabetes: °Your blood sugar is within your personal target range. For most people, a blood sugar level before a meal (preprandial) should be 80-130 mg/dL (4.4-7.2 mmol/L). °You feel   well, and you are able to do daily activities. °If you are in the green zone, continue to manage your diabetes as told by your health care provider. To do this: °Eat a healthy diet. °Exercise regularly. °Check your blood sugar as told by your health care provider. °Take your medicines as told by your health care provider. ° °Where to find more information °American Diabetes Association (ADA): diabetes.org °Association of Diabetes Care & Education Specialists (ADCES):  diabeteseducator.org °Summary °Following a diabetes action plan is a way for you to manage your diabetes symptoms. The plan is color-coded to help you understand what actions you need to take based on any symptoms you are having. °Follow the plan that you develop with your health care provider. Make sure you know your personal target blood sugar level. °Review your treatment plan with your health care provider at each visit. °This information is not intended to replace advice given to you by your health care provider. Make sure you discuss any questions you have with your health care provider. °Document Revised: 01/12/2020 Document Reviewed: 01/12/2020 °Elsevier Patient Education © 2022 [employer] ° °

## 2021-01-23 LAB — HEMOGLOBIN A1C
Est. average glucose Bld gHb Est-mCnc: 137 mg/dL
Hgb A1c MFr Bld: 6.4 % — ABNORMAL HIGH (ref 4.8–5.6)

## 2021-01-23 LAB — BMP8+EGFR
BUN/Creatinine Ratio: 14 (ref 12–28)
BUN: 12 mg/dL (ref 8–27)
CO2: 23 mmol/L (ref 20–29)
Calcium: 9.7 mg/dL (ref 8.7–10.3)
Chloride: 105 mmol/L (ref 96–106)
Creatinine, Ser: 0.88 mg/dL (ref 0.57–1.00)
Glucose: 119 mg/dL — ABNORMAL HIGH (ref 65–99)
Potassium: 4 mmol/L (ref 3.5–5.2)
Sodium: 141 mmol/L (ref 134–144)
eGFR: 70 mL/min/{1.73_m2} (ref >59)

## 2021-01-28 DIAGNOSIS — G4733 Obstructive sleep apnea (adult) (pediatric): Secondary | ICD-10-CM | POA: Diagnosis not present

## 2021-03-04 ENCOUNTER — Other Ambulatory Visit: Payer: Self-pay | Admitting: Internal Medicine

## 2021-03-09 ENCOUNTER — Other Ambulatory Visit: Payer: Self-pay | Admitting: Internal Medicine

## 2021-03-13 ENCOUNTER — Telehealth: Payer: Self-pay

## 2021-03-13 NOTE — Chronic Care Management (AMB) (Signed)
Chronic Care Management Pharmacy Assistant   Name: Martha Pham  MRN: [id number] DOB: 07-14-1951   Reason for Encounter: Disease State/ Hypertension  Recent office visits:  01-22-2021 Glendale Chard, MD. STOP fish oil. START Claritin 10 mg daily. A1C= 6.4. Glucose= 119  Recent consult visits:  None  Hospital visits:  Medication Reconciliation was completed by comparing discharge summary, patient's EMR and Pharmacy list, and upon discussion with patient.  Admitted to the hospital on 01-16-2021 due to Subconjunctival hemorrhage of left eye. Discharge date was 01-16-2021  Discharged from [facility] [place]?Medications Started at [facility] Discharge:?? None  Medication Changes at Hospital Discharge: None  Medications Discontinued at Hospital Discharge: None  Medications that remain the same after Hospital Discharge:??  -All other medications will remain the same.    Medications: Outpatient Encounter Medications as of 03/13/2021  Medication Sig   acetaminophen (TYLENOL) 500 MG tablet Take 1,000 mg by mouth every 6 (six) hours as needed for moderate pain.    Ascorbic Acid (VITAMIN C) 1000 MG tablet Take 1,000 mg by mouth daily.   atorvastatin (LIPITOR) 20 MG tablet Take 1 tablet (20 mg total) by mouth daily.   calcium carbonate (OS-CAL) 600 MG TABS Take 600 mg by mouth 2 (two) times daily with a meal.     Carboxymethylcellulose Sodium (REFRESH OP) Apply 1 drop to eye 2 (two) times daily as needed. For dry eyes   Cyanocobalamin (VITAMIN B-12) 2500 MCG SUBL Place 1 tablet under the tongue daily.   gabapentin (NEURONTIN) 100 MG capsule Take 1 pill at bedtime as needed for leg pain.   glucose blood (CONTOUR NEXT TEST) test strip Use as directed to check blood sugars 1 time per day dx: e11.65   hydrochlorothiazide (HYDRODIURIL) 25 MG tablet Take 1 tablet by mouth once daily   loratadine (CLARITIN) 10 MG tablet Take 1 tablet (10 mg total) by mouth daily.   losartan  (COZAAR) 100 MG tablet Take 1 tablet by mouth once daily   Magnesium 400 MG CAPS Take by mouth daily.    metoprolol succinate (TOPROL-XL) 25 MG 24 hr tablet Take 1 tablet by mouth once daily   Microlet Lancets MISC Use as directed to check blood sugars 1 time per day dx: e11.65   Vitamin D, Cholecalciferol, 50 MCG (2000 UT) CAPS Take by mouth.    No facility-administered encounter medications on file as of 03/13/2021.   Reviewed chart prior to disease state call. Spoke with patient regarding BP  Recent Office Vitals: BP Readings from Last 3 Encounters:  01/22/21 124/60  01/16/21 (!) 156/85  11/23/20 132/72   Pulse Readings from Last 3 Encounters:  01/22/21 78  01/16/21 94  11/23/20 68    Wt Readings from Last 3 Encounters:  01/22/21 164 lb 6.4 oz (74.6 kg)  01/16/21 160 lb (72.6 kg)  11/23/20 165 lb 3.2 oz (74.9 kg)     Kidney Function Lab Results  Component Value Date/Time   CREATININE 0.88 01/22/2021 04:50 PM   CREATININE 1.07 (H) 11/13/2020 02:59 PM   CREATININE 0.8 04/03/2015 11:09 AM   CREATININE 0.9 03/28/2014 11:21 AM   GFRNONAA 56 (L) 05/30/2020 10:12 AM   GFRAA 65 05/30/2020 10:12 AM    BMP Latest Ref Rng & Units 01/22/2021 11/13/2020 05/30/2020  Glucose 65 - 99 mg/dL 119(H) 111(H) 120(H)  BUN 8 - 27 mg/dL 12 18 17   Creatinine 0.57 - 1.00 mg/dL 0.88 1.07(H) 1.01(H)  BUN/Creat Ratio 12 - 28  14 17 17   Sodium 134 - 144 mmol/L 141 140 139  Potassium 3.5 - 5.2 mmol/L 4.0 3.7 3.4(L)  Chloride 96 - 106 mmol/L 105 102 101  CO2 20 - 29 mmol/L 23 22 23   Calcium 8.7 - 10.3 mg/dL 9.7 10.2 9.8    Current antihypertensive regimen:  Hydrochlorothiazide 25 mg daily Losartan 100 mg daily Metoprolol 25 mg daily  How often are you checking your Blood Pressure? 1-2x per week Current home BP readings: 135/78  What recent interventions/DTPs have been made by any provider to improve Blood Pressure control since last CPP Visit:  Educated on Daily salt intake goal < 2300  mg Exercise goal of 150 minutes per week Importance of home blood pressure monitoring Counseled to monitor BP at home at ;east twice per week, document, and provide log at future appointments  Any recent hospitalizations or ED visits since last visit with CPP? Yes  What diet changes have been made to improve Blood Pressure Control?  Patient states she rarely uses salt on her food and is doing good eating vegetables/ fruits and drinking water.  What exercise is being done to improve your Blood Pressure Control?  Patient states she is  walking most days out of the week. Patient states she hasn't been able to go back to the [facility] due to her schedule.  Adherence Review: Is the patient currently on ACE/ARB medication? No Does the patient have >5 day gap between last estimated fill dates? Yes  Care Gaps: Tdap overdue Shingrix overdue Yearly foot exam overdue Medicare wellness 06-06-2021  Star Rating Drugs: Losartan 100 mg- Last filled 01-15-2021 90 DS [facility] Atorvastatin 20 mg- Last filled 09-10-2020 90 DS [place] Clinical Pharmacist Assistant [phone]

## 2021-04-06 ENCOUNTER — Other Ambulatory Visit: Payer: Self-pay | Admitting: Internal Medicine

## 2021-04-08 ENCOUNTER — Telehealth: Payer: Self-pay

## 2021-04-08 NOTE — Chronic Care Management (AMB) (Signed)
04/08/2021-  Martha Pham was reminded to have all medications, supplements and any blood glucose and blood pressure readings available for review with [name], Pharm. D, at her telephone visit on 04/09/2021 at 10:00 AM.  SIG: Pattricia Boss, [place] Pharmacist Assistant [phone]

## 2021-04-09 ENCOUNTER — Ambulatory Visit (INDEPENDENT_AMBULATORY_CARE_PROVIDER_SITE_OTHER): Payer: PPO

## 2021-04-09 DIAGNOSIS — I10 Essential (primary) hypertension: Secondary | ICD-10-CM

## 2021-04-09 DIAGNOSIS — E1122 Type 2 diabetes mellitus with diabetic chronic kidney disease: Secondary | ICD-10-CM

## 2021-04-09 NOTE — Progress Notes (Signed)
Chronic Care Management Pharmacy Note  04/16/2021 Name:  Martha Pham MRN:  [id number] DOB:  1951-03-07  Summary: Patient reports that she was concerned about her BS readings and wanted to know what was normal  Recommendations/Changes made from today's visit: Recommend patient check BP two to three times per week.   Plan: Patient to start checking BP three times per week and document in log book.    Subjective: Martha Pham is an 70 y.o. year old female who is a primary patient of Glendale Chard, MD.  The CCM team was consulted for assistance with disease management and care coordination needs.    Engaged with patient by telephone for follow up visit in response to provider referral for pharmacy case management and/or care coordination services. Patient reports that she is doing well. 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 She is still staying active and committed to her health.   Consent to Services:  The patient was given information about Chronic Care Management services, agreed to services, and gave verbal consent prior to initiation of services.  Please see initial visit note for detailed documentation.   Patient Care Team: Glendale Chard, MD as PCP - General (Internal Medicine) Mayford Knife, [facility] (Pharmacist) Gardenia Phlegm, NP as Nurse Practitioner (Hematology and Oncology) Star Age, MD as Attending Physician (Neurology)  Recent office visits: 01/22/2021 PCP OV 11/13/2020 PCP OV   Recent consult visits: 11/23/2020 [place] [facility] visits: None in previous 6 months   Objective:  Lab Results  Component Value Date   CREATININE 0.88 01/22/2021   BUN 12 01/22/2021   GFRNONAA 56 (L) 05/30/2020   GFRAA 65 05/30/2020   NA 141 01/22/2021   K 4.0 01/22/2021   CALCIUM 9.7 01/22/2021   CO2 23 01/22/2021   GLUCOSE 119 (H) 01/22/2021    Lab Results  Component Value Date/Time   HGBA1C 6.4 (H) 01/22/2021  04:50 PM   HGBA1C 6.1 (H) 11/13/2020 02:59 PM   MICROALBUR 10 05/30/2020 10:12 AM    Last diabetic Eye exam:  Lab Results  Component Value Date/Time   HMDIABEYEEXA No Retinopathy 06/06/2020 12:00 AM    Last diabetic Foot exam: No results found for: HMDIABFOOTEX   Lab Results  Component Value Date   CHOL 170 05/30/2020   HDL 64 05/30/2020   LDLCALC 92 05/30/2020   TRIG 73 05/30/2020   CHOLHDL 2.7 05/30/2020    Hepatic Function Latest Ref Rng & Units 11/13/2020 05/30/2020 11/21/2019  Total Protein 6.0 - 8.5 g/dL 8.0 7.5 7.3  Albumin 3.7 - 4.7 g/dL 4.7 4.4 4.6  AST 0 - 40 IU/L 19 16 14   ALT 0 - 32 IU/L 14 17 13   Alk Phosphatase 44 - 121 IU/L 73 85 87  Total Bilirubin 0.0 - 1.2 mg/dL 0.4 0.5 0.6    Lab Results  Component Value Date/Time   TSH 0.983 05/12/2019 10:04 AM   TSH 0.845 01/20/2019 10:51 AM   FREET4 1.26 05/12/2019 10:04 AM    CBC Latest Ref Rng & Units 05/30/2020 05/12/2019 01/20/2019  WBC 3.4 - 10.8 x10E3/uL 5.8 5.9 5.2  Hemoglobin 11.1 - 15.9 g/dL 12.0 13.2 12.5  Hematocrit 34.0 - 46.6 % 36.2 39.5 37.7  Platelets 150 - 450 x10E3/uL 209 199 183    Lab Results  Component Value Date/Time   VD25OH 56 09/12/2013 09:37 AM    Clinical ASCVD: No  The 10-year ASCVD risk score (  Arnett DK, et al., 2019) is: 24.8%   Values used to calculate the score:     Age: 69 years     Sex: Female     Is Non-Hispanic African American: Yes     Diabetic: Yes     Tobacco smoker: No     Systolic Blood Pressure: 374 mmHg     Is BP treated: Yes     HDL Cholesterol: 64 mg/dL     Total Cholesterol: 170 mg/dL    Depression screen [facility] 2/9 05/30/2020 05/30/2020 05/12/2019  Decreased Interest 0 0 0  Down, Depressed, Hopeless 0 0 1  PHQ - 2 Score 0 0 1  Altered sleeping - - -  Tired, decreased energy - - -  Change in appetite - - -  Feeling bad or failure about yourself  - - -  Trouble concentrating - - -  Moving slowly or fidgety/restless - - -  Suicidal thoughts - - -  PHQ-9  Score - - -  Difficult doing work/chores - - -  Some recent data might be hidden      Social History   Tobacco Use  Smoking Status Never  Smokeless Tobacco Never   BP Readings from Last 3 Encounters:  01/22/21 124/60  01/16/21 (!) 156/85  11/23/20 132/72   Pulse Readings from Last 3 Encounters:  01/22/21 78  01/16/21 94  11/23/20 68   Wt Readings from Last 3 Encounters:  01/22/21 164 lb 6.4 oz (74.6 kg)  01/16/21 160 lb (72.6 kg)  11/23/20 165 lb 3.2 oz (74.9 kg)   BMI Readings from Last 3 Encounters:  01/22/21 32.11 kg/m  01/16/21 31.25 kg/m  11/23/20 33.37 kg/m    Assessment/Interventions: Review of patient past medical history, allergies, medications, health status, including review of consultants reports, laboratory and other test data, was performed as part of comprehensive evaluation and provision of chronic care management services.   SDOH:  (Social Determinants of Health) assessments and interventions performed: No  SDOH Screenings   Alcohol Screen: Not on file  Depression (PHQ2-9): Low Risk    PHQ-2 Score: 0  Financial Resource Strain: Low Risk    Difficulty of Paying Living Expenses: Not hard at all  Food Insecurity: No Food Insecurity   Worried About Charity fundraiser in the Last Year: Never true   Ran Out of Food in the Last Year: Never true  Housing: Not on file  Physical Activity: Inactive   Days of Exercise per Week: 0 days   Minutes of Exercise per Session: 0 min  Social Connections: Not on file  Stress: No Stress Concern Present   Feeling of Stress : Not at all  Tobacco Use: Low Risk    Smoking Tobacco Use: Never   Smokeless Tobacco Use: Never  Transportation Needs: No Transportation Needs   Lack of Transportation (Medical): No   Lack of Transportation (Non-Medical): No    CCM Care Plan  Allergies  Allergen Reactions   Dust Mite Extract Itching   Other     grass    Medications Reviewed Today     Reviewed by Glendale Chard,  MD (Physician) on 01/22/21 at 1618  Med List Status: <None>   Medication Order Taking? Sig Documenting Provider Last Dose Status Informant  acetaminophen (TYLENOL) 500 MG tablet [id number] Yes Take 1,000 mg by mouth every 6 (six) hours as needed for moderate pain.  [provider] Taking Active Self  Ascorbic Acid (VITAMIN C) 1000 MG tablet [id number] Yes  Take 1,000 mg by mouth daily. [provider] Taking Active Self  atorvastatin (LIPITOR) 20 MG tablet [id number] Yes Take 1 tablet (20 mg total) by mouth daily. Glendale Chard, MD Taking Active   calcium carbonate (OS-CAL) 600 MG TABS [id number] Yes Take 600 mg by mouth 2 (two) times daily with a meal.   [provider] Taking Active Self  Carboxymethylcellulose Sodium (REFRESH OP) [id number] Yes Apply 1 drop to eye 2 (two) times daily as needed. For dry eyes [provider] Taking Active Self  Cyanocobalamin (VITAMIN B-12) 2500 MCG SUBL [id number] Yes Place 1 tablet under the tongue daily. [provider] Taking Active Self  gabapentin (NEURONTIN) 100 MG capsule [id number] Yes Take 1 pill at bedtime as needed for leg pain. Ward Givens, NP Taking Active   glucose blood (CONTOUR NEXT TEST) test strip [id number] Yes Use as directed to check blood sugars 1 time per day dx: e11.65 Glendale Chard, MD Taking Active   hydrochlorothiazide (HYDRODIURIL) 25 MG tablet [id number] Yes Take 1 tablet (25 mg total) by mouth daily. Glendale Chard, MD Taking Active   losartan (COZAAR) 100 MG tablet [id number] Yes Take 1 tablet by mouth once daily Glendale Chard, MD Taking Active   Magnesium 400 MG CAPS [id number] Yes Take by mouth daily.  [provider] Taking Active   metoprolol succinate (TOPROL-XL) 25 MG 24 hr tablet [id number] Yes Take 1 tablet (25 mg total) by mouth daily. Glendale Chard, MD Taking Active   Microlet Lancets [place] [id number] Yes Use as directed to check blood sugars 1 time per day dx: e11.65 Glendale Chard, MD Taking Active   Vitamin D, Cholecalciferol, 50 MCG (2000 UT) CAPS [id number] Yes Take by mouth.  [provider] Taking Active             Patient Active Problem List   Diagnosis Date Noted   History of breast cancer 11/23/2020   Diabetes mellitus with stage 2 chronic kidney disease ([place]) 06/22/2018   Chronic renal disease, stage II 06/22/2018   Parenchymal renal hypertension 06/22/2018   Adult BMI 29.0-29.9 kg/sq m 06/22/2018   Pain in joint, pelvic region and thigh 03/28/2014   Acquired absence of breast and absent nipple 05/05/2012    Immunization History  Administered Date(s) Administered   Fluad Quad(high Dose 65+) 05/30/2020   Influenza, High Dose Seasonal PF 05/05/2018, 04/14/2019   Influenza,inj,Quad PF,6+ Mos 04/04/2014   PFIZER(Purple Top)SARS-COV-2 Vaccination 11/03/2019, 11/28/2019, 07/08/2020   Pneumococcal Polysaccharide-23 05/12/2019    Conditions to be addressed/monitored:  Hypertension and Diabetes  Care Plan : [place]  Updates made by Mayford Knife, [place] since 04/16/2021 12:00 AM     Problem: HTN, DM II   Priority: High  Onset Date: 04/16/2021     Long-Range Goal: Disease Management   Start Date: 10/30/2020  Recent Progress: On track  Priority: High  Note:   Current Barriers:  Unable to independently monitor therapeutic efficacy  Pharmacist Clinical Goal(s):  Patient will achieve adherence to monitoring guidelines and medication adherence to achieve therapeutic efficacy through collaboration with PharmD and provider.   Interventions: 1:1 collaboration with Glendale Chard, MD regarding development and update of comprehensive plan of care as evidenced by provider attestation and co-signature Inter-disciplinary care team collaboration (see longitudinal plan of care) Comprehensive medication review performed; medication list updated in electronic medical record  Hypertension (BP goal  <130/80) -Controlled -Current treatment: Hydrochlorothiazide 25 mg taking 1 tablet by mouth daily Metoprolol Succinate 25 mg- taking  1 tablet by mouth daily Losartan 100 mg tablet once per day -Current home readings: at this time patient is not checking BP at home  -Current dietary habits: she is doing much better, she has started a regimen -Current exercise habits: she is doing exercising at home, she reports she is getting her walking [facility] everyday[facility] -Denies hypotensive/hypertensive symptoms -Educated on Exercise goal of 150 minutes per week; Importance of home blood pressure monitoring; Proper BP monitoring technique; -Counseled to monitor BP at home at least three times per week, document, and provide log at future appointments -Recommended to continue current medication   Diabetes (A1c goal <7%) -Controlled -Current medications: None noted -Current home glucose readings fasting glucose: 126 -Denies hypoglycemic/hyperglycemic symptoms -Current meal patterns: she is monitoring her meals more often, she is trying to cut back on carbs and sugars. -Educated on Cholesterol goals;  -Educated on A1c and blood sugar goals; Complications of diabetes including kidney damage, retinal damage, and cardiovascular disease; Benefits of routine self-monitoring of blood sugar; -Counseled to check feet daily and get yearly eye exams -Counseled on diet and exercise extensively -Recommended to continue current medication   Patient Goals/Self-Care Activities Patient will:  - take medications as prescribed  Follow Up Plan: The patient has been provided with contact information for the care management team and has been advised to call with any health related questions or concerns.       Medication Assistance: None required.  Patient affirms current coverage meets needs.  Compliance/Adherence/Medication fill history: Care Gaps: TDaP   Star-Rating Drugs: Losartan  100 mg tablet  Atorvastatin 20 mg tablet   Patient's preferred pharmacy is:  [facility]  [address], [place] [address] Phone: [phone] Fax: [place] [address] [place] [address] [address] [place] [address] Phone: [phone] Fax: [phone]  Uses pill box? Yes Pt endorses 85% compliance  We discussed: Benefits of medication synchronization, packaging and delivery as well as enhanced pharmacist oversight with [facility]. Patient decided to: Continue current medication management strategy  Care Plan and Follow Up Patient Decision:  Patient agrees to Care Plan and Follow-up.  Plan: The patient has been provided with contact information for the care management team and has been advised to call with any health related questions or concerns.   [name], PharmD Clinical Pharmacist [facility] [phone]

## 2021-04-16 NOTE — Patient Instructions (Signed)
Visit Information It was great speaking with you today!  Please let me know if you have any questions about our visit.   Goals Addressed             This Visit's Progress    Track and Manage My Blood Pressure-Hypertension       Timeframe:  Long-Range Goal Priority:  High Start Date:                             Expected End Date:                       Follow Up Date 10/08/2021  In Progress: - check blood pressure 3 times per week - choose a place to take my blood pressure (home, clinic or office, retail store) - write blood pressure results in a log or diary    Why is this important?   You won't feel high blood pressure, but it can still hurt your blood vessels.  High blood pressure can cause heart or kidney problems. It can also cause a stroke.  Making lifestyle changes like losing a little weight or eating less salt will help.  Checking your blood pressure at home and at different times of the day can help to control blood pressure.  If the doctor prescribes medicine remember to take it the way the doctor ordered.  Call the office if you cannot afford the medicine or if there are questions about it.             Patient Care Plan: CCM Pharmacy Care Plan     Problem Identified: HTN, DM II   Priority: High  Onset Date: 04/16/2021     Long-Range Goal: Disease Management   Start Date: 10/30/2020  Recent Progress: On track  Priority: High  Note:   Current Barriers:  Unable to independently monitor therapeutic efficacy  Pharmacist Clinical Goal(s):  Patient will achieve adherence to monitoring guidelines and medication adherence to achieve therapeutic efficacy through collaboration with PharmD and provider.   Interventions: 1:1 collaboration with Glendale Chard, MD regarding development and update of comprehensive plan of care as evidenced by provider attestation and co-signature Inter-disciplinary care team collaboration (see longitudinal plan of  care) Comprehensive medication review performed; medication list updated in electronic medical record  Hypertension (BP goal <130/80) -Controlled -Current treatment: Hydrochlorothiazide 25 mg taking 1 tablet by mouth daily Metoprolol Succinate 25 mg- taking 1 tablet by mouth daily Losartan 100 mg tablet once per day -Current home readings: at this time patient is not checking BP at home  -Current dietary habits: she is doing much better, she has started a regimen -Current exercise habits: she is doing exercising at home, she reports she is getting her walking in everyday, [facility] -Denies hypotensive/hypertensive symptoms -Educated on Exercise goal of 150 minutes per week; Importance of home blood pressure monitoring; Proper BP monitoring technique; -Counseled to monitor BP at home at least three times per week, document, and provide log at future appointments -Recommended to continue current medication   Diabetes (A1c goal <7%) -Controlled -Current medications: None noted -Current home glucose readings fasting glucose: 126 -Denies hypoglycemic/hyperglycemic symptoms -Current meal patterns: she is monitoring her meals more often, she is trying to cut back on carbs and sugars. -Educated on Cholesterol goals;  -Educated on A1c and blood sugar goals; Complications of diabetes including kidney damage, retinal damage, and cardiovascular  disease; Benefits of routine self-monitoring of blood sugar; -Counseled to check feet daily and get yearly eye exams -Counseled on diet and exercise extensively -Recommended to continue current medication   Patient Goals/Self-Care Activities Patient will:  - take medications as prescribed  Follow Up Plan: The patient has been provided with contact information for the care management team and has been advised to call with any health related questions or concerns.       Patient agreed to services and verbal consent obtained.    The patient verbalized understanding of instructions, educational materials, and care plan provided today and agreed to receive a mailed copy of patient instructions, educational materials, and care plan.   [name], PharmD Clinical Pharmacist [facility] [phone]

## 2021-04-19 DIAGNOSIS — E1122 Type 2 diabetes mellitus with diabetic chronic kidney disease: Secondary | ICD-10-CM

## 2021-04-19 DIAGNOSIS — N182 Chronic kidney disease, stage 2 (mild): Secondary | ICD-10-CM

## 2021-04-19 DIAGNOSIS — I10 Essential (primary) hypertension: Secondary | ICD-10-CM | POA: Diagnosis not present

## 2021-05-01 ENCOUNTER — Telehealth: Payer: PPO

## 2021-05-14 LAB — HM DIABETES EYE EXAM

## 2021-05-16 ENCOUNTER — Telehealth: Payer: Self-pay

## 2021-05-16 NOTE — Chronic Care Management (AMB) (Signed)
Chronic Care Management Pharmacy Assistant   Name: Martha Pham  MRN: [id number] DOB: [birth year]   Reason for Encounter: Disease State/ Hypertension  Recent office visits:  None  Recent consult visits:  None  Hospital visits:  Medication Reconciliation was completed by comparing discharge summary, patient's EMR and Pharmacy list, and upon discussion with patient.   Admitted to the hospital on 01-16-2021 due to Subconjunctival hemorrhage of left eye. Discharge date was 01-16-2021  Discharged from [place]?Medications Started at [facility] Discharge:?? None   Medication Changes at Hospital Discharge: None   Medications Discontinued at Hospital Discharge: None   Medications that remain the same after Hospital Discharge:??  -All other medications will remain the same.    Medications: Outpatient Encounter Medications as of 05/16/2021  Medication Sig   acetaminophen (TYLENOL) 500 MG tablet Take 1,000 mg by mouth every 6 (six) hours as needed for moderate pain.    Ascorbic Acid (VITAMIN C) 1000 MG tablet Take 1,000 mg by mouth daily.   atorvastatin (LIPITOR) 20 MG tablet Take 1 tablet (20 mg total) by mouth daily.   calcium carbonate (OS-CAL) 600 MG TABS Take 600 mg by mouth 2 (two) times daily with a meal.     Carboxymethylcellulose Sodium (REFRESH OP) Apply 1 drop to eye 2 (two) times daily as needed. For dry eyes   Cyanocobalamin (VITAMIN B-12) 2500 MCG SUBL Place 1 tablet under the tongue daily.   gabapentin (NEURONTIN) 100 MG capsule Take 1 pill at bedtime as needed for leg pain.   glucose blood (CONTOUR NEXT TEST) test strip Use as directed to check blood sugars 1 time per day dx: e11.65   hydrochlorothiazide (HYDRODIURIL) 25 MG tablet Take 1 tablet by mouth once daily   loratadine (CLARITIN) 10 MG tablet Take 1 tablet (10 mg total) by mouth daily.   losartan (COZAAR) 100 MG tablet Take 1 tablet by mouth once daily   Magnesium 400 MG CAPS Take by  mouth daily.    metoprolol succinate (TOPROL-XL) 25 MG 24 hr tablet Take 1 tablet by mouth once daily   Microlet Lancets MISC Use as directed to check blood sugars 1 time per day dx: e11.65   Vitamin D, Cholecalciferol, 50 MCG (2000 UT) CAPS Take by mouth.    No facility-administered encounter medications on file as of 05/16/2021.   Reviewed chart prior to disease state call. Spoke with patient regarding BP  Recent Office Vitals: BP Readings from Last 3 Encounters:  01/22/21 124/60  01/16/21 (!) 156/85  11/23/20 132/72   Pulse Readings from Last 3 Encounters:  01/22/21 78  01/16/21 94  11/23/20 68    Wt Readings from Last 3 Encounters:  01/22/21 164 lb 6.4 oz (74.6 kg)  01/16/21 160 lb (72.6 kg)  11/23/20 165 lb 3.2 oz (74.9 kg)     Kidney Function Lab Results  Component Value Date/Time   CREATININE 0.88 01/22/2021 04:50 PM   CREATININE 1.07 (H) 11/13/2020 02:59 PM   CREATININE 0.8 04/03/2015 11:09 AM   CREATININE 0.9 03/28/2014 11:21 AM   GFRNONAA 56 (L) 05/30/2020 10:12 AM   GFRAA 65 05/30/2020 10:12 AM    BMP Latest Ref Rng & Units 01/22/2021 11/13/2020 05/30/2020  Glucose 65 - 99 mg/dL 119(H) 111(H) 120(H)  BUN 8 - 27 mg/dL 12 18 17   Creatinine 0.57 - 1.00 mg/dL 0.88 1.07(H) 1.01(H)  BUN/Creat Ratio 12 - 28 14 17 17   Sodium 134 - 144 mmol/L 141  140 139  Potassium 3.5 - 5.2 mmol/L 4.0 3.7 3.4(L)  Chloride 96 - 106 mmol/L 105 102 101  CO2 20 - 29 mmol/L 23 22 23   Calcium 8.7 - 10.3 mg/dL 9.7 10.2 9.8      05-16-2021: 1st attempt left VM 05-17-2021: 2nd attempt left VM   Care Gaps: Tdap overdue Shingrix overdue PNA Vac overdue last completed 05-12-2019 Yearly foot exam overdue last completed 05-12-2019 Covid booster overdue last completed 07-08-2020 Flu vaccine overdue last completed 05-30-2020 AWV 06-06-2021  Star Rating Drugs: Losartan 100 mg- Last filled 04-16-2021 90 DS [facility] Atorvastatin 20 mg- Last filled 09-10-2020 90 DS [facility] Clinical Pharmacist Assistant [phone]

## 2021-06-05 ENCOUNTER — Other Ambulatory Visit: Payer: Self-pay | Admitting: Internal Medicine

## 2021-06-06 ENCOUNTER — Encounter: Payer: Self-pay | Admitting: Internal Medicine

## 2021-06-06 ENCOUNTER — Ambulatory Visit: Payer: PPO

## 2021-06-06 ENCOUNTER — Other Ambulatory Visit: Payer: Self-pay

## 2021-06-06 ENCOUNTER — Ambulatory Visit (INDEPENDENT_AMBULATORY_CARE_PROVIDER_SITE_OTHER): Payer: PPO | Admitting: Internal Medicine

## 2021-06-06 VITALS — BP 134/82 | HR 62 | Temp 98.6°F | Ht 59.2 in | Wt 166.2 lb

## 2021-06-06 DIAGNOSIS — Z6833 Body mass index (BMI) 33.0-33.9, adult: Secondary | ICD-10-CM | POA: Diagnosis not present

## 2021-06-06 DIAGNOSIS — L910 Hypertrophic scar: Secondary | ICD-10-CM | POA: Insufficient documentation

## 2021-06-06 DIAGNOSIS — I129 Hypertensive chronic kidney disease with stage 1 through stage 4 chronic kidney disease, or unspecified chronic kidney disease: Secondary | ICD-10-CM | POA: Diagnosis not present

## 2021-06-06 DIAGNOSIS — E782 Mixed hyperlipidemia: Secondary | ICD-10-CM | POA: Diagnosis not present

## 2021-06-06 DIAGNOSIS — Z853 Personal history of malignant neoplasm of breast: Secondary | ICD-10-CM

## 2021-06-06 DIAGNOSIS — Z0000 Encounter for general adult medical examination without abnormal findings: Secondary | ICD-10-CM | POA: Diagnosis not present

## 2021-06-06 DIAGNOSIS — E1122 Type 2 diabetes mellitus with diabetic chronic kidney disease: Secondary | ICD-10-CM | POA: Diagnosis not present

## 2021-06-06 DIAGNOSIS — E6609 Other obesity due to excess calories: Secondary | ICD-10-CM | POA: Diagnosis not present

## 2021-06-06 DIAGNOSIS — Z23 Encounter for immunization: Secondary | ICD-10-CM

## 2021-06-06 DIAGNOSIS — E66811 Obesity, class 1: Secondary | ICD-10-CM | POA: Insufficient documentation

## 2021-06-06 DIAGNOSIS — N182 Chronic kidney disease, stage 2 (mild): Secondary | ICD-10-CM | POA: Diagnosis not present

## 2021-06-06 LAB — POCT URINALYSIS DIPSTICK
Bilirubin, UA: NEGATIVE
Blood, UA: NEGATIVE
Glucose, UA: NEGATIVE
Ketones, UA: NEGATIVE
Leukocytes, UA: NEGATIVE
Nitrite, UA: NEGATIVE
Protein, UA: NEGATIVE
Spec Grav, UA: 1.02 (ref 1.010–1.025)
Urobilinogen, UA: 0.2 E.U./dL
pH, UA: 5 (ref 5.0–8.0)

## 2021-06-06 LAB — POCT UA - MICROALBUMIN
Albumin/Creatinine Ratio, Urine, POC: <30
Creatinine, POC: 300 mg/dL
Microalbumin Ur, POC: 10 mg/L

## 2021-06-06 MED ORDER — TETANUS-DIPHTH-ACELL PERTUSSIS 5-2.5-18.5 LF-MCG/0.5 IM SUSP: 0.5000 mL | Freq: Once | INTRAMUSCULAR | 0 refills | Status: AC

## 2021-06-06 NOTE — Patient Instructions (Signed)

## 2021-06-06 NOTE — Progress Notes (Signed)
[name],acting as a Education administrator for [name], MD.,have documented all relevant documentation on the behalf of [name], MD,as directed by  [name], MD while in the presence of [name], MD.  This visit occurred during the SARS-CoV-2 public health emergency.  Safety protocols were in place, including screening questions prior to the visit, additional usage of staff PPE, and extensive cleaning of exam room while observing appropriate contact time as indicated for disinfecting solutions.  Subjective:     Patient ID: Martha Pham , female    DOB: [birth year] , 70 y.o.   MRN: [id number]   Chief Complaint  Patient presents with   Annual Exam   Hypertension   Diabetes    HPI  The patient is here today for a physical examination. She reports compliance with BP meds. She is not taking any meds for diabetes. She also reports she no longer goes to a GYN provider.  She has no specific concerns or complaints at this time.    Hypertension This is a chronic problem. The current episode started more than 1 year ago. The problem has been gradually improving since onset. The problem is controlled. Risk factors for coronary artery disease include diabetes mellitus, dyslipidemia, post-menopausal state, obesity and sedentary lifestyle. The current treatment provides moderate improvement. Compliance problems include exercise.   Diabetes She presents for her follow-up diabetic visit. She has type 2 diabetes mellitus. Her disease course has been improving. There are no hypoglycemic associated symptoms. There are no diabetic associated symptoms. There are no hypoglycemic complications. Risk factors for coronary artery disease include diabetes mellitus, dyslipidemia, hypertension, obesity and post-menopausal.    Past Medical History:  Diagnosis Date   Apnea, sleep    does wear cpap   Arthritis    Cancer ([place])    lt. breast ca-snbx   Chronic kidney disease    Contact  lens/glasses fitting    wears contacts or glasses   COPD (chronic obstructive pulmonary disease) (HCC)    brother   Heart murmur    Hypercholesteremia    Hyperlipemia    Hypertension    Lower back pain    Lung cancer ([place])    Malaise and fatigue    Personal history of chemotherapy 2013   Sleep apnea    SLEEP STUDY DX 10,CPAP BUT DON'TUSE   Vitamin D deficiency    Wears dentures    upper     Family History  Problem Relation Age of Onset   Brain cancer Father    Heart attack Mother    Breast cancer Sister 22   Breast cancer Sister 90     Current Outpatient Medications:    acetaminophen (TYLENOL) 500 MG tablet, Take 1,000 mg by mouth every 6 (six) hours as needed for moderate pain. , Disp: , Rfl:    Ascorbic Acid (VITAMIN C) 1000 MG tablet, Take 1,000 mg by mouth daily., Disp: , Rfl:    atorvastatin (LIPITOR) 20 MG tablet, Take 1 tablet (20 mg total) by mouth daily., Disp: 90 tablet, Rfl: 2   calcium carbonate (OS-CAL) 600 MG TABS, Take 600 mg by mouth 2 (two) times daily with a meal.  , Disp: , Rfl:    Carboxymethylcellulose Sodium (REFRESH OP), Apply 1 drop to eye 2 (two) times daily as needed. For dry eyes, Disp: , Rfl:    Cyanocobalamin (VITAMIN B-12) 2500 MCG SUBL, Place 1 tablet under the tongue daily., Disp: , Rfl:    gabapentin (  NEURONTIN) 100 MG capsule, Take 1 pill at bedtime as needed for leg pain., Disp: 90 capsule, Rfl: 3   glucose blood (CONTOUR NEXT TEST) test strip, Use as directed to check blood sugars 1 time per day dx: e11.65, Disp: 100 each, Rfl: 3   hydrochlorothiazide (HYDRODIURIL) 25 MG tablet, Take 1 tablet by mouth once daily, Disp: 90 tablet, Rfl: 1   loratadine (CLARITIN) 10 MG tablet, Take 1 tablet (10 mg total) by mouth daily., Disp: 30 tablet, Rfl: 2   losartan (COZAAR) 100 MG tablet, Take 1 tablet by mouth once daily, Disp: 90 tablet, Rfl: 0   Magnesium 400 MG CAPS, Take by mouth daily. , Disp: , Rfl:    metoprolol succinate (TOPROL-XL) 25 MG 24 hr  tablet, Take 1 tablet by mouth once daily, Disp: 90 tablet, Rfl: 1   Microlet Lancets MISC, Use as directed to check blood sugars 1 time per day dx: e11.65, Disp: 100 each, Rfl: 3   Vitamin D, Cholecalciferol, 50 MCG (2000 UT) CAPS, Take by mouth. , Disp: , Rfl:    Allergies  Allergen Reactions   Dust Mite Extract Itching   Other     grass      The patient states she uses post menopausal status for birth control. Last LMP was No LMP recorded. Patient is postmenopausal.. Negative for Dysmenorrhea. Negative for: breast discharge, breast lump(s), breast pain and breast self exam. Associated symptoms include abnormal vaginal bleeding. Pertinent negatives include abnormal bleeding (hematology), anxiety, decreased libido, depression, difficulty falling sleep, dyspareunia, history of infertility, nocturia, sexual dysfunction, sleep disturbances, urinary incontinence, urinary urgency, vaginal discharge and vaginal itching. Diet regular.The patient states her exercise level is  intermittent.   . The patient's tobacco use is:  Social History   Tobacco Use  Smoking Status Never  Smokeless Tobacco Never  . She has been exposed to passive smoke. The patient's alcohol use is:  Social History   Substance and Sexual Activity  Alcohol Use Not Currently   Alcohol/week: 2.0 standard drinks   Types: 2 Glasses of wine per week   Comment: 8 oz occasionally per week   Review of Systems  Constitutional: Negative.   HENT: Negative.    Eyes: Negative.   Respiratory: Negative.    Cardiovascular: Negative.   Gastrointestinal: Negative.   Endocrine: Negative.   Genitourinary: Negative.   Musculoskeletal: Negative.   Skin: Negative.   Allergic/Immunologic: Negative.   Neurological: Negative.   Hematological: Negative.   Psychiatric/Behavioral: Negative.      Today's Vitals   06/06/21 0840  BP: 134/82  Pulse: 62  Temp: 98.6 F (37 C)  Weight: 166 lb 3.2 oz (75.4 kg)  Height: 4' 11.2" (1.504 m)   PainSc: 0-No pain   Body mass index is 33.34 kg/m.  Wt Readings from Last 3 Encounters:  06/06/21 166 lb 3.2 oz (75.4 kg)  01/22/21 164 lb 6.4 oz (74.6 kg)  01/16/21 160 lb (72.6 kg)     Objective:  Physical Exam Vitals and nursing note reviewed.  Constitutional:      Appearance: Normal appearance. She is obese.  HENT:     Head: Normocephalic and atraumatic.     Right Ear: Tympanic membrane, ear canal and external ear normal.     Left Ear: Tympanic membrane, ear canal and external ear normal.     Nose:     Comments: Masked     Mouth/Throat:     Comments: Masked  Eyes:     Extraocular Movements:  Extraocular movements intact.     Conjunctiva/sclera: Conjunctivae normal.     Pupils: Pupils are equal, round, and reactive to light.  Cardiovascular:     Rate and Rhythm: Normal rate and regular rhythm.     Pulses: Normal pulses.          Dorsalis pedis pulses are 2+ on the right side and 2+ on the left side.     Heart sounds: Normal heart sounds.  Pulmonary:     Effort: Pulmonary effort is normal.     Breath sounds: Normal breath sounds.  Chest:  Breasts:    Tanner Score is 5.     Comments: S/p mastectomy on the left Healed surgical scars on the right.  Keloid right upr anterior thorax, no surrounding erythema Abdominal:     General: Bowel sounds are normal.     Palpations: Abdomen is soft.  Genitourinary:    Comments: deferred Musculoskeletal:        General: Normal range of motion.     Cervical back: Normal range of motion and neck supple.  Feet:     Right foot:     Protective Sensation: 5 sites tested.  5 sites sensed.     Skin integrity: Dry skin present.     Toenail Condition: Right toenails are normal.     Left foot:     Protective Sensation: 5 sites tested.  5 sites sensed.     Skin integrity: Dry skin present.     Toenail Condition: Left toenails are normal.  Skin:    General: Skin is warm and dry.  Neurological:     General: No focal deficit present.      Mental Status: She is alert and oriented to person, place, and time.  Psychiatric:        Mood and Affect: Mood normal.        Behavior: Behavior normal.        Assessment And Plan:     1. Encounter for general adult medical examination w/o abnormal findings Comments: A full exam was performed. Importance of monthly self breast exams was discussed with the patient. PATIENT IS ADVISED TO GET 30-45 MINUTES REGULAR EXERCISE NO LESS THAN FOUR TO FIVE DAYS PER WEEK - BOTH WEIGHTBEARING EXERCISES AND AEROBIC ARE RECOMMENDED.  PATIENT IS ADVISED TO FOLLOW A HEALTHY DIET WITH AT LEAST SIX FRUITS/VEGGIES PER DAY, DECREASE INTAKE OF RED MEAT, AND TO INCREASE FISH INTAKE TO TWO DAYS PER WEEK.  MEATS/FISH SHOULD NOT BE FRIED, BAKED OR BROILED IS PREFERABLE.  IT IS ALSO IMPORTANT TO CUT BACK ON YOUR SUGAR INTAKE. PLEASE AVOID ANYTHING WITH ADDED SUGAR, CORN SYRUP OR OTHER SWEETENERS. IF YOU MUST USE A SWEETENER, YOU CAN TRY STEVIA. IT IS ALSO IMPORTANT TO AVOID ARTIFICIALLY SWEETENERS AND DIET BEVERAGES. LASTLY, I SUGGEST WEARING SPF 50 SUNSCREEN ON EXPOSED PARTS AND ESPECIALLY WHEN IN THE DIRECT SUNLIGHT FOR AN EXTENDED PERIOD OF TIME.  PLEASE AVOID FAST FOOD RESTAURANTS AND INCREASE YOUR WATER INTAKE.  2. Hypertensive nephropathy Comments: Chronic, controlled. EKG performed, SB w/o acute changes. Encouraged to follow low sodium diet. She will f/u in six months for re-evaluation.  - EKG 12-Lead - CBC - CMP14+EGFR - Lipid panel - TSH  3. Diabetes mellitus with stage 2 chronic kidney disease ([place]) Comments: Diabetic foot exam was performed. Controlled off of meds. She will f/u in 4-6 months for re-evaluation. I DISCUSSED WITH THE PATIENT AT LENGTH REGARDING THE GOALS OF GLYCEMIC CONTROL AND POSSIBLE LONG-TERM COMPLICATIONS.  I  ALSO STRESSED THE IMPORTANCE OF COMPLIANCE WITH HOME GLUCOSE MONITORING, DIETARY RESTRICTIONS INCLUDING AVOIDANCE OF SUGARY DRINKS/PROCESSED FOODS,  ALONG WITH REGULAR EXERCISE.   I  ALSO STRESSED THE IMPORTANCE OF ANNUAL EYE EXAMS, SELF FOOT CARE AND COMPLIANCE WITH OFFICE VISITS.  - POCT Urinalysis Dipstick (81002) - POCT UA - Microalbumin - Hemoglobin A1c  4. Mixed hyperlipidemia Comments: Chronic, encouraged to follow heart healthy lifestyle. She is encouraged to comply with statin therapy.  - Lipid panel - TSH  5. Keloid Comments: Per her request, I will refer her to Derm for further evaluation.  - Ambulatory referral to Dermatology  6. Class 1 obesity due to excess calories with serious comorbidity and body mass index (BMI) of 33.0 to 33.9 in adult Comments: She is encouraged to strive for BMi less than 30 to decrease cardiac risk.  She is advised to aim for at least 150 minutes of exercise per week.   7. History of breast cancer Comments: She was diagnosed in 05/2011, treated with lumpectomy followed by mastectomy, and adjuvant chemotherapy. Now followed in [facility].   8. Immunization due Comments: She was given high dose flu vaccine. I will send rx Tdap to her update her immunization history.  - Flu Vaccine QUAD High Dose(Fluad) - Tdap (BOOSTRIX) 5-2.5-18.5 LF-MCG/0.5 injection; Inject 0.5 mLs into the muscle once for 1 dose.  Dispense: 0.5 mL; Refill: 0  Patient was given opportunity to ask questions. Patient verbalized understanding of the plan and was able to repeat key elements of the plan. All questions were answered to their satisfaction.   I, [name], MD, have reviewed all documentation for this visit. The documentation on 06/09/21 for the exam, diagnosis, procedures, and orders are all accurate and complete.   THE PATIENT IS ENCOURAGED TO PRACTICE SOCIAL DISTANCING DUE TO THE COVID-19 PANDEMIC.

## 2021-06-07 LAB — CBC
Hematocrit: 34.5 % (ref 34.0–46.6)
Hemoglobin: 11.8 g/dL (ref 11.1–15.9)
MCH: 31.7 pg (ref 26.6–33.0)
MCHC: 34.2 g/dL (ref 31.5–35.7)
MCV: 93 fL (ref 79–97)
Platelets: 200 10*3/uL (ref 150–450)
RBC: 3.72 x10E6/uL — ABNORMAL LOW (ref 3.77–5.28)
RDW: 11.1 % — ABNORMAL LOW (ref 11.7–15.4)
WBC: 5 10*3/uL (ref 3.4–10.8)

## 2021-06-07 LAB — HEMOGLOBIN A1C
Est. average glucose Bld gHb Est-mCnc: 137 mg/dL
Hgb A1c MFr Bld: 6.4 % — ABNORMAL HIGH (ref 4.8–5.6)

## 2021-06-07 LAB — LIPID PANEL
Chol/HDL Ratio: 3.8 ratio (ref 0.0–4.4)
Cholesterol, Total: 210 mg/dL — ABNORMAL HIGH (ref 100–199)
HDL: 56 mg/dL
LDL Chol Calc (NIH): 137 mg/dL — ABNORMAL HIGH (ref 0–99)
Triglycerides: 94 mg/dL (ref 0–149)
VLDL Cholesterol Cal: 17 mg/dL (ref 5–40)

## 2021-06-07 LAB — CMP14+EGFR
ALT: 12 IU/L (ref 0–32)
AST: 13 IU/L (ref 0–40)
Albumin/Globulin Ratio: 1.6 (ref 1.2–2.2)
Albumin: 4.6 g/dL (ref 3.7–4.7)
Alkaline Phosphatase: 76 IU/L (ref 44–121)
BUN/Creatinine Ratio: 21 (ref 12–28)
BUN: 20 mg/dL (ref 8–27)
Bilirubin Total: 0.4 mg/dL (ref 0.0–1.2)
CO2: 23 mmol/L (ref 20–29)
Calcium: 10 mg/dL (ref 8.7–10.3)
Chloride: 102 mmol/L (ref 96–106)
Creatinine, Ser: 0.94 mg/dL (ref 0.57–1.00)
Globulin, Total: 2.9 g/dL (ref 1.5–4.5)
Glucose: 131 mg/dL — ABNORMAL HIGH (ref 70–99)
Potassium: 4.2 mmol/L (ref 3.5–5.2)
Sodium: 139 mmol/L (ref 134–144)
Total Protein: 7.5 g/dL (ref 6.0–8.5)
eGFR: 64 mL/min/{1.73_m2} (ref >59)

## 2021-06-07 LAB — TSH: TSH: 1.09 u[IU]/mL (ref 0.450–4.500)

## 2021-06-19 ENCOUNTER — Ambulatory Visit (INDEPENDENT_AMBULATORY_CARE_PROVIDER_SITE_OTHER): Payer: PPO

## 2021-06-19 ENCOUNTER — Other Ambulatory Visit: Payer: Self-pay

## 2021-06-19 VITALS — BP 132/70 | HR 69 | Temp 98.1°F | Ht 59.0 in | Wt 167.4 lb

## 2021-06-19 DIAGNOSIS — Z23 Encounter for immunization: Secondary | ICD-10-CM

## 2021-06-19 DIAGNOSIS — Z0000 Encounter for general adult medical examination without abnormal findings: Secondary | ICD-10-CM | POA: Diagnosis not present

## 2021-06-19 MED ORDER — BOOSTRIX 5-2.5-18.5 LF-MCG/0.5 IM SUSP
0.5000 mL | Freq: Once | INTRAMUSCULAR | 0 refills | Status: AC
Start: 2021-06-19 — End: 2021-06-19

## 2021-06-19 MED ORDER — PREVNAR 20 0.5 ML IM SUSY
0.5000 mL | PREFILLED_SYRINGE | INTRAMUSCULAR | 0 refills | Status: AC
Start: 2021-06-19 — End: 2021-06-19

## 2021-06-19 NOTE — Progress Notes (Signed)
This visit occurred during the SARS-CoV-2 public health emergency.  Safety protocols were in place, including screening questions prior to the visit, additional usage of staff PPE, and extensive cleaning of exam room while observing appropriate contact time as indicated for disinfecting solutions.  Subjective:   Martha Pham is a 70 y.o. female who presents for Medicare Annual (Subsequent) preventive examination.  Review of Systems     Cardiac Risk Factors include: advanced age (>61men, >3 women);diabetes mellitus;dyslipidemia;hypertension;obesity (BMI >30kg/m2)     Objective:    Today's Vitals   06/19/21 1550  BP: 132/70  Pulse: 69  Temp: 98.1 F (36.7 C)  TempSrc: Oral  SpO2: 97%  Weight: 167 lb 6.4 oz (75.9 kg)  Height: 4\' 11"  (1.499 m)   Body mass index is 33.81 kg/m.  Advanced Directives 06/19/2021 01/16/2021 05/30/2020 05/12/2019 02/26/2018 10/02/2015 04/03/2015  Does Patient Have a Medical Advance Directive? No No No No No No No  Would patient like information on creating a medical advance directive? No - Patient declined - Yes (MAU/Ambulatory/Procedural Areas - Information given) Yes (Inpatient - patient requests chaplain consult to create a medical advance directive) - - No - patient declined information  Pre-existing out of facility DNR order (yellow form or pink MOST form) - - - - - - -    Current Medications (verified) Outpatient Encounter Medications as of 06/19/2021  Medication Sig   acetaminophen (TYLENOL) 500 MG tablet Take 1,000 mg by mouth every 6 (six) hours as needed for moderate pain.    Ascorbic Acid (VITAMIN C) 1000 MG tablet Take 1,000 mg by mouth daily.   atorvastatin (LIPITOR) 20 MG tablet Take 1 tablet (20 mg total) by mouth daily.   calcium carbonate (OS-CAL) 600 MG TABS Take 600 mg by mouth 2 (two) times daily with a meal.     Carboxymethylcellulose Sodium (REFRESH OP) Apply 1 drop to eye 2 (two) times daily as needed. For dry eyes   Cyanocobalamin  (VITAMIN B-12) 2500 MCG SUBL Place 1 tablet under the tongue daily.   gabapentin (NEURONTIN) 100 MG capsule Take 1 pill at bedtime as needed for leg pain.   glucose blood (CONTOUR NEXT TEST) test strip Use as directed to check blood sugars 1 time per day dx: e11.65   hydrochlorothiazide (HYDRODIURIL) 25 MG tablet Take 1 tablet by mouth once daily   loratadine (CLARITIN) 10 MG tablet Take 1 tablet (10 mg total) by mouth daily.   losartan (COZAAR) 100 MG tablet Take 1 tablet by mouth once daily   Magnesium 400 MG CAPS Take by mouth daily.    metoprolol succinate (TOPROL-XL) 25 MG 24 hr tablet Take 1 tablet by mouth once daily   Microlet Lancets MISC Use as directed to check blood sugars 1 time per day dx: e11.65   pneumococcal 20-valent conjugate vaccine (PREVNAR 20) 0.5 ML injection Inject 0.5 mLs into the muscle tomorrow at 10 am for 1 dose.   Tdap (BOOSTRIX) 5-2.5-18.5 LF-MCG/0.5 injection Inject 0.5 mLs into the muscle once for 1 dose.   Vitamin D, Cholecalciferol, 50 MCG (2000 UT) CAPS Take by mouth.    No facility-administered encounter medications on file as of 06/19/2021.    Allergies (verified) Dust mite extract and Other   History: Past Medical History:  Diagnosis Date   Apnea, sleep    does wear cpap   Arthritis    Cancer ([place])    lt. breast ca-snbx   Chronic kidney disease    Contact lens/glasses fitting  wears contacts or glasses   COPD (chronic obstructive pulmonary disease) ([place])    brother   Heart murmur    Hypercholesteremia    Hyperlipemia    Hypertension    Lower back pain    Lung cancer ([place])    Malaise and fatigue    Personal history of chemotherapy 2013   Sleep apnea    SLEEP STUDY DX 10,CPAP BUT DON'TUSE   Vitamin D deficiency    Wears dentures    upper   Past Surgical History:  Procedure Laterality Date   BREAST BIOPSY Left 05/26/2011   malignant   BREAST BIOPSY Right 10/29/2016   BREAST IMPLANT EXCHANGE Left 09/23/2012   Procedure: REMOVAL  OF LEFT EXPANDER/PLACEMENT OF IMPLANT WITH REDUCTION OF RIGHT BREAST;  Surgeon: Theodoro Kos, DO;  Location: [facility];  Service: Plastics;  Laterality: Left;   BREAST LUMPECTOMY W/ NEEDLE LOCALIZATION  06/24/2011   left   BREAST REDUCTION SURGERY Right 09/23/2012   Procedure: MAMMARY REDUCTION  (BREAST);  Surgeon: Theodoro Kos, DO;  Location: [facility];  Service: Plastics;  Laterality: Right;   BREAST SURGERY  08/07/2011   re-exc. left breast margins   CATARACT EXTRACTION Bilateral    january and february 2022   CYST REMOVAL NECK  08/2013   Dr. Migdalia Dk   LIPOSUCTION Bilateral 09/23/2012   Procedure: LIPOSUCTION;  Surgeon: Theodoro Kos, DO;  Location: [facility];  Service: Plastics;  Laterality: Bilateral;   MASTECTOMY Left 2012   MOUTH SURGERY     TOOTH IMPLANT BOTTOM X2 LFT   PORT-A-CATH REMOVAL Right 09/23/2012   Procedure: REMOVAL PORT-A-CATH;  Surgeon: Theodoro Kos, DO;  Location: [facility];  Service: Plastics;  Laterality: Right;   PORTACATH PLACEMENT  06/24/2011   Procedure: INSERTION PORT-A-CATH;  Surgeon: Judieth Keens, DO;  Location: [facility];  Service: General;  Laterality: Right;  Right mediport placement   RECONSTRUCTION BREAST IMMEDIATE / DELAYED W/ TISSUE EXPANDER  04/2012   left   REDUCTION MAMMAPLASTY Right 2013   SIMPLE MASTECTOMY  09/01/2011   left   TONSILLECTOMY     Family History  Problem Relation Age of Onset   Brain cancer Father    Heart attack Mother    Breast cancer Sister 37   Breast cancer Sister 33   Social History   Socioeconomic History   Marital status: Widowed    Spouse name: Not on file   Number of children: 1   Years of education: 14   Highest education level: Not on file  Occupational History   Occupation: Retired  Tobacco Use   Smoking status: Never   Smokeless tobacco: Never  Vaping Use   Vaping Use: Never used  Substance and Sexual Activity    Alcohol use: Not Currently    Alcohol/week: 2.0 standard drinks    Types: 2 Glasses of wine per week    Comment: 8 oz occasionally per week   Drug use: No   Sexual activity: Not Currently    Partners: Male    Birth control/protection: Post-menopausal  Other Topics Concern   Not on file  Social History Narrative   Daughter [place], [place]   Social Determinants of Health   Financial Resource Strain: Low Risk    Difficulty of Paying Living Expenses: Not hard at all  Food Insecurity: No Food Insecurity   Worried About Charity fundraiser in the Last Year: Never true   Arboriculturist in  the Last Year: Never true  Transportation Needs: No Transportation Needs   Lack of Transportation (Medical): No   Lack of Transportation (Non-Medical): No  Physical Activity: Inactive   Days of Exercise per Week: 0 days   Minutes of Exercise per Session: 0 min  Stress: No Stress Concern Present   Feeling of Stress : Only a little  Social Connections: Not on file    Tobacco Counseling Counseling given: Not Answered   Clinical Intake:  Pre-visit preparation completed: Yes  Pain : No/denies pain     Nutritional Status: BMI > 30  Obese Nutritional Risks: None Diabetes: Yes  How often do you need to have someone help you when you read instructions, pamphlets, or other written materials from your doctor or pharmacy?: 1 - Never What is the last grade level you completed in school?: some college  Diabetic? Yes Nutrition Risk Assessment:  Has the patient had any N/V/D within the last 2 months?  No  Does the patient have any non-healing wounds?  No  Has the patient had any unintentional weight loss or weight gain?  No   Diabetes:  Is the patient diabetic?  Yes  If diabetic, was a CBG obtained today?  No  Did the patient bring in their glucometer from home?  No  How often do you monitor your CBG's? 3 times weekly.   Financial Strains and Diabetes Management:  Are you having any  financial strains with the device, your supplies or your medication? No .  Does the patient want to be seen by Chronic Care Management for management of their diabetes?  No  Would the patient like to be referred to a Nutritionist or for Diabetic Management?  No   Diabetic Exams:  Diabetic Eye Exam: Completed 06/06/2020 Diabetic Foot Exam: Completed 06/06/2021  Interpreter Needed?: No  Information entered by :: NAllen LPN   Activities of Daily Living In your present state of health, do you have any difficulty performing the following activities: 06/19/2021 06/06/2021  Hearing? N N  Vision? N N  Difficulty concentrating or making decisions? N N  Walking or climbing stairs? N N  Dressing or bathing? N N  Doing errands, shopping? N N  Preparing Food and eating ? N -  Using the Toilet? N -  In the past six months, have you accidently leaked urine? N -  Do you have problems with loss of bowel control? N -  Managing your Medications? N -  Managing your Finances? N -  Housekeeping or managing your Housekeeping? N -  Some recent data might be hidden    Patient Care Team: Glendale Chard, MD as PCP - General (Internal Medicine) Mayford Knife, [facility] (Pharmacist) Delice Bison Charlestine Massed, NP as Nurse Practitioner (Hematology and Oncology) Star Age, MD as Attending Physician (Neurology)  Indicate any recent Medical Services you may have received from other than [facility] providers in the past year (date may be approximate).     Assessment:   This is a routine wellness examination for [place].  Hearing/Vision screen Vision Screening - Comments:: Regular eye exams, Dr. Sherral Hammers  Dietary issues and exercise activities discussed: Current Exercise Habits: The patient has a physically strenuous job, but has no regular exercise apart from work.   Goals Addressed             This Visit's Progress    Patient Stated       06/19/2021, wants to get back on exercise regimen and eating  healthy  Depression Screen PHQ 2/9 Scores 06/19/2021 06/06/2021 05/30/2020 05/30/2020 05/12/2019 05/12/2019 01/20/2019  PHQ - 2 Score 0 0 0 0 1 3 3   PHQ- 9 Score - - - - - 9 8    Fall Risk Fall Risk  06/19/2021 06/06/2021 05/30/2020 05/30/2020 05/12/2019  Falls in the past year? 0 0 0 0 0  Number falls in past yr: - 0 - - -  Injury with Fall? - 0 - - -  Risk for fall due to : Medication side effect - Medication side effect - -  Follow up Falls evaluation completed;Education provided;Falls prevention discussed - Falls evaluation completed;Education provided;Falls prevention discussed - -    FALL RISK PREVENTION PERTAINING TO THE HOME:  Any stairs in or around the home? Yes  If so, are there any without handrails? No  Home free of loose throw rugs in walkways, pet beds, electrical cords, etc? Yes  Adequate lighting in your home to reduce risk of falls? Yes   ASSISTIVE DEVICES UTILIZED TO PREVENT FALLS:  Life alert? No  Use of a cane, walker or w/c? No  Grab bars in the bathroom? Yes  Shower chair or bench in shower? Yes  Elevated toilet seat or a handicapped toilet? Yes   TIMED UP AND GO:  Was the test performed? No .    Gait steady and fast without use of assistive device  Cognitive Function:     6CIT Screen 06/19/2021 05/30/2020 05/12/2019  What Year? 0 points 0 points 0 points  What month? 0 points 0 points 0 points  What time? 0 points 0 points 0 points  Count back from 20 0 points 0 points 0 points  Months in reverse 0 points 2 points 0 points  Repeat phrase 2 points 0 points 8 points  Total Score 2 2 8     Immunizations Immunization History  Administered Date(s) Administered   Fluad Quad(high Dose 65+) 05/30/2020, 06/06/2021   Influenza, High Dose Seasonal PF 05/05/2018, 04/14/2019   Influenza,inj,Quad PF,6+ Mos 04/04/2014   PFIZER(Purple Top)SARS-COV-2 Vaccination 11/03/2019, 11/28/2019, 07/08/2020   Pneumococcal Polysaccharide-23 05/12/2019     TDAP status: Due, Education has been provided regarding the importance of this vaccine. Advised may receive this vaccine at local pharmacy or Health Dept. Aware to provide a copy of the vaccination record if obtained from local pharmacy or Health Dept. Verbalized acceptance and understanding.  Flu Vaccine status: Up to date  Pneumococcal vaccine status: Due, Education has been provided regarding the importance of this vaccine. Advised may receive this vaccine at local pharmacy or Health Dept. Aware to provide a copy of the vaccination record if obtained from local pharmacy or Health Dept. Verbalized acceptance and understanding.  Covid-19 vaccine status: Completed vaccines  Qualifies for Shingles Vaccine? Yes   Zostavax completed No   Shingrix Completed?: No.    Education has been provided regarding the importance of this vaccine. Patient has been advised to call insurance company to determine out of pocket expense if they have not yet received this vaccine. Advised may also receive vaccine at local pharmacy or Health Dept. Verbalized acceptance and understanding.  Screening Tests Health Maintenance  Topic Date Due   TETANUS/TDAP  Never done   Zoster Vaccines- Shingrix (1 of 2) Never done   Pneumonia Vaccine 65+ Years old (2 - PCV) 05/11/2020   COVID-19 Vaccine (4 - Booster for Pfizer series) 09/02/2020   OPHTHALMOLOGY EXAM  06/06/2021   HEMOGLOBIN A1C  12/04/2021   FOOT EXAM  06/06/2022  MAMMOGRAM  08/22/2022   COLONOSCOPY (Pts 45-11yrs Insurance coverage will need to be confirmed)  12/02/2025   INFLUENZA VACCINE  Completed   DEXA SCAN  Completed   Hepatitis C Screening  Completed   HPV VACCINES  Aged Out    Health Maintenance  Health Maintenance Due  Topic Date Due   TETANUS/TDAP  Never done   Zoster Vaccines- Shingrix (1 of 2) Never done   Pneumonia Vaccine 45+ Years old (2 - PCV) 05/11/2020   COVID-19 Vaccine (4 - Booster for Pfizer series) 09/02/2020   OPHTHALMOLOGY  EXAM  06/06/2021    Colorectal cancer screening: Type of screening: Colonoscopy. Completed 12/03/2015. Repeat every 5 years  Mammogram status: Completed 08/22/2020. Repeat every year  Bone Density status: Completed 11/05/2016.   Lung Cancer Screening: (Low Dose CT Chest recommended if Age 22-80 years, 30 pack-year currently smoking OR have quit w/in 15years.) does not qualify.   Lung Cancer Screening Referral: no  Additional Screening:  Hepatitis C Screening: does qualify; Completed 01/28/2012  Vision Screening: Recommended annual ophthalmology exams for early detection of glaucoma and other disorders of the eye. Is the patient up to date with their annual eye exam?  Yes  Who is the provider or what is the name of the office in which the patient attends annual eye exams? Dr. Sherral Hammers  If pt is not established with a provider, would they like to be referred to a provider to establish care? No .   Dental Screening: Recommended annual dental exams for proper oral hygiene  Community Resource Referral / Chronic Care Management: CRR required this visit?  No   CCM required this visit?  No      Plan:     I have personally reviewed and noted the following in the patient's chart:   Medical and social history Use of alcohol, tobacco or illicit drugs  Current medications and supplements including opioid prescriptions.  Functional ability and status Nutritional status Physical activity Advanced directives List of other physicians Hospitalizations, surgeries, and ER visits in previous 12 months Vitals Screenings to include cognitive, depression, and falls Referrals and appointments  In addition, I have reviewed and discussed with patient certain preventive protocols, quality metrics, and best practice recommendations. A written personalized care plan for preventive services as well as general preventive health recommendations were provided to patient.     Kellie Simmering,  LPN   [id number]   Nurse Notes: none

## 2021-06-19 NOTE — Patient Instructions (Signed)
Martha Pham , Thank you for taking time to come for your Medicare Wellness Visit. I appreciate your ongoing commitment to your health goals. Please review the following plan we discussed and let me know if I can assist you in the future.   Screening recommendations/referrals: Colonoscopy: scheduled 06/20/2021 Mammogram: completed 08/22/2020 Bone Density: completed 11/05/2016 Recommended yearly ophthalmology/optometry visit for glaucoma screening and checkup Recommended yearly dental visit for hygiene and checkup  Vaccinations: Influenza vaccine: completed 06/06/2021 Pneumococcal vaccine: due Tdap vaccine: due Shingles vaccine: discussed   Covid-19: 07/08/2020, 11/28/2019, 11/03/2019  Advanced directives: Advance directive discussed with you today. Even though you declined this today please call our office should you change your mind and we can give you the proper paperwork for you to fill out.  Conditions/risks identified: none  Next appointment: Follow up in one year for your annual wellness visit    Preventive Care 65 Years and Older, Female Preventive care refers to lifestyle choices and visits with your health care provider that can promote health and wellness. What does preventive care include? A yearly physical exam. This is also called an annual well check. Dental exams once or twice a year. Routine eye exams. Ask your health care provider how often you should have your eyes checked. Personal lifestyle choices, including: Daily care of your teeth and gums. Regular physical activity. Eating a healthy diet. Avoiding tobacco and drug use. Limiting alcohol use. Practicing safe sex. Taking low-dose aspirin every day. Taking vitamin and mineral supplements as recommended by your health care provider. What happens during an annual well check? The services and screenings done by your health care provider during your annual well check will depend on your age, overall health, lifestyle  risk factors, and family history of disease. Counseling  Your health care provider may ask you questions about your: Alcohol use. Tobacco use. Drug use. Emotional well-being. Home and relationship well-being. Sexual activity. Eating habits. History of falls. Memory and ability to understand (cognition). Work and work Statistician. Reproductive health. Screening  You may have the following tests or measurements: Height, weight, and BMI. Blood pressure. Lipid and cholesterol levels. These may be checked every 5 years, or more frequently if you are over 26 years old. Skin check. Lung cancer screening. You may have this screening every year starting at age 57 if you have a 30-pack-year history of smoking and currently smoke or have quit within the past 15 years. Fecal occult blood test (FOBT) of the stool. You may have this test every year starting at age 108. Flexible sigmoidoscopy or colonoscopy. You may have a sigmoidoscopy every 5 years or a colonoscopy every 10 years starting at age 17. Hepatitis C blood test. Hepatitis B blood test. Sexually transmitted disease (STD) testing. Diabetes screening. This is done by checking your blood sugar (glucose) after you have not eaten for a while (fasting). You may have this done every 1-3 years. Bone density scan. This is done to screen for osteoporosis. You may have this done starting at age 48. Mammogram. This may be done every 1-2 years. Talk to your health care provider about how often you should have regular mammograms. Talk with your health care provider about your test results, treatment options, and if necessary, the need for more tests. Vaccines  Your health care provider may recommend certain vaccines, such as: Influenza vaccine. This is recommended every year. Tetanus, diphtheria, and acellular pertussis (Tdap, Td) vaccine. You may need a Td booster every 10 years. Zoster vaccine. You may  need this after age 22. Pneumococcal  13-valent conjugate (PCV13) vaccine. One dose is recommended after age 21. Pneumococcal polysaccharide (PPSV23) vaccine. One dose is recommended after age 31. Talk to your health care provider about which screenings and vaccines you need and how often you need them. This information is not intended to replace advice given to you by your health care provider. Make sure you discuss any questions you have with your health care provider. Document Released: 08/03/2015 Document Revised: 03/26/2016 Document Reviewed: 05/08/2015 Elsevier Interactive Patient Education  2017 [place] Prevention in the Home Falls can cause injuries. They can happen to people of all ages. There are many things you can do to make your home safe and to help prevent falls. What can I do on the outside of my home? Regularly fix the edges of walkways and driveways and fix any cracks. Remove anything that might make you trip as you walk through a door, such as a raised step or threshold. Trim any bushes or trees on the path to your home. Use bright outdoor lighting. Clear any walking paths of anything that might make someone trip, such as rocks or tools. Regularly check to see if handrails are loose or broken. Make sure that both sides of any steps have handrails. Any raised decks and porches should have guardrails on the edges. Have any leaves, snow, or ice cleared regularly. Use sand or salt on walking paths during winter. Clean up any spills in your garage right away. This includes oil or grease spills. What can I do in the bathroom? Use night lights. Install grab bars by the toilet and in the tub and shower. Do not use towel bars as grab bars. Use non-skid mats or decals in the tub or shower. If you need to sit down in the shower, use a plastic, non-slip stool. Keep the floor dry. Clean up any water that spills on the floor as soon as it happens. Remove soap buildup in the tub or shower regularly. Attach  bath mats securely with double-sided non-slip rug tape. Do not have throw rugs and other things on the floor that can make you trip. What can I do in the bedroom? Use night lights. Make sure that you have a light by your bed that is easy to reach. Do not use any sheets or blankets that are too big for your bed. They should not hang down onto the floor. Have a firm chair that has side arms. You can use this for support while you get dressed. Do not have throw rugs and other things on the floor that can make you trip. What can I do in the kitchen? Clean up any spills right away. Avoid walking on wet floors. Keep items that you use a lot in easy-to-reach places. If you need to reach something above you, use a strong step stool that has a grab bar. Keep electrical cords out of the way. Do not use floor polish or wax that makes floors slippery. If you must use wax, use non-skid floor wax. Do not have throw rugs and other things on the floor that can make you trip. What can I do with my stairs? Do not leave any items on the stairs. Make sure that there are handrails on both sides of the stairs and use them. Fix handrails that are broken or loose. Make sure that handrails are as long as the stairways. Check any carpeting to make sure that it is firmly attached  to the stairs. Fix any carpet that is loose or worn. Avoid having throw rugs at the top or bottom of the stairs. If you do have throw rugs, attach them to the floor with carpet tape. Make sure that you have a light switch at the top of the stairs and the bottom of the stairs. If you do not have them, ask someone to add them for you. What else can I do to help prevent falls? Wear shoes that: Do not have high heels. Have rubber bottoms. Are comfortable and fit you well. Are closed at the toe. Do not wear sandals. If you use a stepladder: Make sure that it is fully opened. Do not climb a closed stepladder. Make sure that both sides of the  stepladder are locked into place. Ask someone to hold it for you, if possible. Clearly mark and make sure that you can see: Any grab bars or handrails. First and last steps. Where the edge of each step is. Use tools that help you move around (mobility aids) if they are needed. These include: Canes. Walkers. Scooters. Crutches. Turn on the lights when you go into a dark area. Replace any light bulbs as soon as they burn out. Set up your furniture so you have a clear path. Avoid moving your furniture around. If any of your floors are uneven, fix them. If there are any pets around you, be aware of where they are. Review your medicines with your doctor. Some medicines can make you feel dizzy. This can increase your chance of falling. Ask your doctor what other things that you can do to help prevent falls. This information is not intended to replace advice given to you by your health care provider. Make sure you discuss any questions you have with your health care provider. Document Released: 05/03/2009 Document Revised: 12/13/2015 Document Reviewed: 08/11/2014 Elsevier Interactive Patient Education  2017 [employer].

## 2021-06-21 DIAGNOSIS — K635 Polyp of colon: Secondary | ICD-10-CM | POA: Diagnosis not present

## 2021-06-21 DIAGNOSIS — K5730 Diverticulosis of large intestine without perforation or abscess without bleeding: Secondary | ICD-10-CM | POA: Diagnosis not present

## 2021-06-21 DIAGNOSIS — Z86010 Personal history of colonic polyps: Secondary | ICD-10-CM | POA: Diagnosis not present

## 2021-06-21 LAB — HM COLONOSCOPY

## 2021-06-25 DIAGNOSIS — K635 Polyp of colon: Secondary | ICD-10-CM | POA: Diagnosis not present

## 2021-06-27 DIAGNOSIS — L910 Hypertrophic scar: Secondary | ICD-10-CM | POA: Diagnosis not present

## 2021-06-27 DIAGNOSIS — L298 Other pruritus: Secondary | ICD-10-CM | POA: Diagnosis not present

## 2021-07-04 ENCOUNTER — Encounter: Payer: Self-pay | Admitting: Internal Medicine

## 2021-07-05 ENCOUNTER — Other Ambulatory Visit: Payer: Self-pay | Admitting: Internal Medicine

## 2021-07-25 DIAGNOSIS — H35033 Hypertensive retinopathy, bilateral: Secondary | ICD-10-CM | POA: Diagnosis not present

## 2021-07-25 DIAGNOSIS — E119 Type 2 diabetes mellitus without complications: Secondary | ICD-10-CM | POA: Diagnosis not present

## 2021-07-25 DIAGNOSIS — Z961 Presence of intraocular lens: Secondary | ICD-10-CM | POA: Diagnosis not present

## 2021-07-25 DIAGNOSIS — H16421 Pannus (corneal), right eye: Secondary | ICD-10-CM | POA: Diagnosis not present

## 2021-07-25 LAB — HM DIABETES EYE EXAM

## 2021-07-30 ENCOUNTER — Encounter: Payer: Self-pay | Admitting: Internal Medicine

## 2021-08-07 ENCOUNTER — Telehealth: Payer: Self-pay

## 2021-08-07 NOTE — Progress Notes (Signed)
Chronic Care Management Pharmacy Assistant   Name: ARLYN BUMPUS  MRN: [id number] DOB: [birth year]  Reason for Encounter: Disease State/Hypertension Adherence Call    Recent office visits:   06/19/2021 Glenna Durand LPN ( PCP) Medicare Annual Wellness Exam-Pneumococcal 20-Val Conj Vacc 0.5 ML 0.5 mLs Intramuscular Tomorrow-1000,  Tetanus-Diphth-Acell Pertussis 5-2.5-18.5 LF-MCG/0.5 0.5 mLs Intramuscular  Once   06/06/2021 Glendale Chard MD ( PCP) Tetanus-Diphth-Acell Pertussis 5-2.5-18.5 LF-MCG/0.5 0.5 mLs Intramuscular  Once     Recent consult visits:   06/27/2021 Ree Edman MD- ( Dermatology) visit unavailable  06/25/2021 Mali Rund MD ( Pathology) visit unavailable  06/21/2021 Mali Rund MD ( Pathology) visit unavailable   Hospital visits:  None in previous 6 months  Medications: Outpatient Encounter Medications as of 08/07/2021  Medication Sig   acetaminophen (TYLENOL) 500 MG tablet Take 1,000 mg by mouth every 6 (six) hours as needed for moderate pain.    Ascorbic Acid (VITAMIN C) 1000 MG tablet Take 1,000 mg by mouth daily.   atorvastatin (LIPITOR) 20 MG tablet Take 1 tablet (20 mg total) by mouth daily.   calcium carbonate (OS-CAL) 600 MG TABS Take 600 mg by mouth 2 (two) times daily with a meal.     Carboxymethylcellulose Sodium (REFRESH OP) Apply 1 drop to eye 2 (two) times daily as needed. For dry eyes   Cyanocobalamin (VITAMIN B-12) 2500 MCG SUBL Place 1 tablet under the tongue daily.   gabapentin (NEURONTIN) 100 MG capsule Take 1 pill at bedtime as needed for leg pain.   glucose blood (CONTOUR NEXT TEST) test strip Use as directed to check blood sugars 1 time per day dx: e11.65   hydrochlorothiazide (HYDRODIURIL) 25 MG tablet Take 1 tablet by mouth once daily   loratadine (CLARITIN) 10 MG tablet Take 1 tablet (10 mg total) by mouth daily.   losartan (COZAAR) 100 MG tablet Take 1 tablet by mouth once daily   Magnesium 400 MG CAPS Take by mouth daily.     metoprolol succinate (TOPROL-XL) 25 MG 24 hr tablet Take 1 tablet by mouth once daily   Microlet Lancets MISC Use as directed to check blood sugars 1 time per day dx: e11.65   Vitamin D, Cholecalciferol, 50 MCG (2000 UT) CAPS Take by mouth.    No facility-administered encounter medications on file as of 08/07/2021.    Recent Office Vitals: BP Readings from Last 3 Encounters:  06/19/21 132/70  06/06/21 134/82  01/22/21 124/60   Pulse Readings from Last 3 Encounters:  06/19/21 69  06/06/21 62  01/22/21 78    Wt Readings from Last 3 Encounters:  06/19/21 167 lb 6.4 oz (75.9 kg)  06/06/21 166 lb 3.2 oz (75.4 kg)  01/22/21 164 lb 6.4 oz (74.6 kg)     Kidney Function Lab Results  Component Value Date/Time   CREATININE 0.94 06/06/2021 09:39 AM   CREATININE 0.88 01/22/2021 04:50 PM   CREATININE 0.8 04/03/2015 11:09 AM   CREATININE 0.9 03/28/2014 11:21 AM   GFRNONAA 56 (L) 05/30/2020 10:12 AM   GFRAA 65 05/30/2020 10:12 AM    BMP Latest Ref Rng & Units 06/06/2021 01/22/2021 11/13/2020  Glucose 70 - 99 mg/dL 131(H) 119(H) 111(H)  BUN 8 - 27 mg/dL 20 12 18   Creatinine 0.57 - 1.00 mg/dL 0.94 0.88 1.07(H)  BUN/Creat Ratio 12 - 28 21 14 17   Sodium 134 - 144 mmol/L 139 141 140  Potassium 3.5 - 5.2 mmol/L 4.2 4.0 3.7  Chloride 96 - 106 mmol/L 102  105 102  CO2 20 - 29 mmol/L 23 23 22   Calcium 8.7 - 10.3 mg/dL 10.0 9.7 10.2   Called patient left message 08/07/2021 Called patient left message 08/08/2021  Care Gaps: Last annual wellness visit 06/06/2021 Tdap overdue Shingrix overdue PNA Vac overdue last completed 05-12-2019 Yearly foot exam overdue last completed 05-12-2019 Covid booster overdue last completed 07-08-2020 Flu vaccine overdue last completed 05-30-2020  Star Rating Drugs:  Medication:  Last Fill: Day Supply   Losartan 100 mg                  07/15/2021     90 DS [facility] Atorvastatin 20 mg                 09/10/2020    90 DS [place] Clinical Pharmacist Assistant [phone]

## 2021-08-16 ENCOUNTER — Other Ambulatory Visit: Payer: Self-pay | Admitting: Internal Medicine

## 2021-08-16 DIAGNOSIS — Z1231 Encounter for screening mammogram for malignant neoplasm of breast: Secondary | ICD-10-CM

## 2021-09-04 DIAGNOSIS — R7303 Prediabetes: Secondary | ICD-10-CM | POA: Diagnosis not present

## 2021-09-04 DIAGNOSIS — E785 Hyperlipidemia, unspecified: Secondary | ICD-10-CM | POA: Diagnosis not present

## 2021-09-04 DIAGNOSIS — I10 Essential (primary) hypertension: Secondary | ICD-10-CM | POA: Diagnosis not present

## 2021-09-04 DIAGNOSIS — Z853 Personal history of malignant neoplasm of breast: Secondary | ICD-10-CM | POA: Diagnosis not present

## 2021-09-04 DIAGNOSIS — Z0000 Encounter for general adult medical examination without abnormal findings: Secondary | ICD-10-CM | POA: Diagnosis not present

## 2021-09-12 ENCOUNTER — Ambulatory Visit
Admission: RE | Admit: 2021-09-12 | Discharge: 2021-09-12 | Disposition: A | Discharge status: Home / Self Care | Payer: Medicare Other | Source: Ambulatory Visit | Attending: Internal Medicine | Admitting: Internal Medicine

## 2021-09-12 ENCOUNTER — Other Ambulatory Visit: Payer: Self-pay | Admitting: Internal Medicine

## 2021-09-12 DIAGNOSIS — Z1231 Encounter for screening mammogram for malignant neoplasm of breast: Secondary | ICD-10-CM | POA: Diagnosis not present

## 2021-10-02 ENCOUNTER — Telehealth: Payer: Self-pay

## 2021-10-02 NOTE — Chronic Care Management (AMB) (Signed)
? ? ? ? ? ?  Martha Pham was reminded to have all medications, supplements and any blood glucose and blood pressure readings available for review with [name], Pharm. D, at her telephone visit on 10-08-2021 at 12:00. ? ? ? ?Jeannette How CMA ?Clinical Pharmacist Assistant ?[phone] ? ? ?

## 2021-10-03 ENCOUNTER — Other Ambulatory Visit: Payer: Self-pay | Admitting: Internal Medicine

## 2021-10-08 ENCOUNTER — Ambulatory Visit (INDEPENDENT_AMBULATORY_CARE_PROVIDER_SITE_OTHER): Payer: Medicare Other

## 2021-10-08 DIAGNOSIS — I129 Hypertensive chronic kidney disease with stage 1 through stage 4 chronic kidney disease, or unspecified chronic kidney disease: Secondary | ICD-10-CM

## 2021-10-08 DIAGNOSIS — E782 Mixed hyperlipidemia: Secondary | ICD-10-CM

## 2021-10-08 NOTE — Progress Notes (Signed)
? ?Chronic Care Management ?Pharmacy Note ? ?10/18/2021 ?Name:  Martha Pham MRN:  [id number] DOB:  1951/04/08 ? ?Summary: ?Patient reports that she is doing okay  ? ?Recommendations/Changes made from today's visit: ?Recommended that patient start going to the gym at least once per week. Recommend patients Atorvastatin 20 mg tablet dose be increased to 40 mg tablet daily.  ? ?Plan: ?Patient is going to start going to the gym at least once per week for thirty minutes. Collaborate with PCP team to increase patients Atorvastatin. Patient to have labs completed in 6 weeks following dose change.  ? ? ?Subjective: ?Martha Pham is an 71 y.o. year old female who is a primary patient of Glendale Chard, MD.  The CCM team was consulted for assistance with disease management and care coordination needs.   ? ?Engaged with patient by telephone for follow up visit in response to provider referral for pharmacy case management and/or care coordination services. She is involved in the [employer]. She is a breast cancer survivor and is very active in the [employer].  ? ?Consent to Services:  ?The patient was given information about Chronic Care Management services, agreed to services, and gave verbal consent prior to initiation of services.  Please see initial visit note for detailed documentation.  ? ?Patient Care Team: ?Glendale Chard, MD as PCP - General (Internal Medicine) ?Mayford Knife, RPH (Pharmacist) ?Gardenia Phlegm, NP as Nurse Practitioner (Hematology and Oncology) ?Star Age, MD as Attending Physician (Neurology) ? ?Recent office visits: ?06/06/2021 PCP OV ? ?Recent consult visits: ?06/27/2021 -Dermatology OV ? ?06/21/2021 - Gastroenterology Visit ? ?Hospital visits: ?None in previous 6 months ? ? ?Objective: ? ?Lab Results  ?Component Value Date  ? CREATININE 0.94 06/06/2021  ? BUN 20 06/06/2021  ? EGFR 64 06/06/2021  ? GFRNONAA 56 (L) 05/30/2020  ? GFRAA 65 05/30/2020  ? NA 139 06/06/2021  ? K  4.2 06/06/2021  ? CALCIUM 10.0 06/06/2021  ? CO2 23 06/06/2021  ? GLUCOSE 131 (H) 06/06/2021  ? ? ?Lab Results  ?Component Value Date/Time  ? HGBA1C 6.4 (H) 06/06/2021 09:39 AM  ? HGBA1C 6.4 (H) 01/22/2021 04:50 PM  ? MICROALBUR 10 06/06/2021 09:13 AM  ? MICROALBUR 10 05/30/2020 10:12 AM  ?  ?Last diabetic Eye exam:  ?Lab Results  ?Component Value Date/Time  ? HMDIABEYEEXA No Retinopathy 05/14/2021 12:00 AM  ?  ?Last diabetic Foot exam: No results found for: HMDIABFOOTEX  ? ?Lab Results  ?Component Value Date  ? CHOL 210 (H) 06/06/2021  ? HDL 56 06/06/2021  ? LDLCALC 137 (H) 06/06/2021  ? TRIG 94 06/06/2021  ? CHOLHDL 3.8 06/06/2021  ? ? ? ?  Latest Ref Rng & Units 06/06/2021  ?  9:39 AM 11/13/2020  ?  2:59 PM 05/30/2020  ? 10:12 AM  ?Hepatic Function  ?Total Protein 6.0 - 8.5 g/dL 7.5   8.0   7.5    ?Albumin 3.7 - 4.7 g/dL 4.6   4.7   4.4    ?AST 0 - 40 IU/L 13   19   16     ?ALT 0 - 32 IU/L 12   14   17     ?Alk Phosphatase 44 - 121 IU/L 76   73   85    ?Total Bilirubin 0.0 - 1.2 mg/dL 0.4   0.4   0.5    ? ? ?Lab Results  ?Component Value Date/Time  ? TSH 1.090 06/06/2021 09:39 AM  ? TSH 0.983  05/12/2019 10:04 AM  ? FREET4 1.26 05/12/2019 10:04 AM  ? ? ? ?  Latest Ref Rng & Units 06/06/2021  ?  9:39 AM 05/30/2020  ? 10:12 AM 05/12/2019  ? 10:04 AM  ?CBC  ?WBC 3.4 - 10.8 x10E3/uL 5.0   5.8   5.9    ?Hemoglobin 11.1 - 15.9 g/dL 11.8   12.0   13.2    ?Hematocrit 34.0 - 46.6 % 34.5   36.2   39.5    ?Platelets 150 - 450 x10E3/uL 200   209   199    ? ? ?Lab Results  ?Component Value Date/Time  ? VD25OH 56 09/12/2013 09:37 AM  ? ? ?Clinical ASCVD: No  ?The 10-year ASCVD risk score (Arnett DK, et al., 2019) is: 31.5% ?  Values used to calculate the score: ?    Age: 6 years ?    Sex: Female ?    Is Non-Hispanic African American: Yes ?    Diabetic: Yes ?    Tobacco smoker: No ?    Systolic Blood Pressure: 545 mmHg ?    Is BP treated: Yes ?    HDL Cholesterol: 56 mg/dL ?    Total Cholesterol: 210 mg/dL   ? ? ?  06/19/2021  ?   4:06 PM 06/06/2021  ?  8:47 AM 05/30/2020  ?  9:13 AM  ?Depression screen PHQ 2/9  ?Decreased Interest 0 0 0  ?Down, Depressed, Hopeless 0 0 0  ?PHQ - 2 Score 0 0 0  ?  ? ?Social History  ? ?Tobacco Use  ?Smoking Status Never  ?Smokeless Tobacco Never  ? ?BP Readings from Last 3 Encounters:  ?06/19/21 132/70  ?06/06/21 134/82  ?01/22/21 124/60  ? ?Pulse Readings from Last 3 Encounters:  ?06/19/21 69  ?06/06/21 62  ?01/22/21 78  ? ?Wt Readings from Last 3 Encounters:  ?06/19/21 167 lb 6.4 oz (75.9 kg)  ?06/06/21 166 lb 3.2 oz (75.4 kg)  ?01/22/21 164 lb 6.4 oz (74.6 kg)  ? ?BMI Readings from Last 3 Encounters:  ?06/19/21 33.81 kg/m?  ?06/06/21 33.34 kg/m?  ?01/22/21 32.11 kg/m?  ? ? ?Assessment/Interventions: Review of patient past medical history, allergies, medications, health status, including review of consultants reports, laboratory and other test data, was performed as part of comprehensive evaluation and provision of chronic care management services.  ? ?SDOH:  (Social Determinants of Health) assessments and interventions performed: Yes ?SDOH Interventions   ? ?Flowsheet Row Most Recent Value  ?SDOH Interventions   ?Physical Activity Interventions [facility] [employer]  ? ?  ? ?SDOH Screenings  ? ?Alcohol Screen: Not on file  ?Depression (PHQ2-9): Low Risk   ? PHQ-2 Score: 0  ?Financial Resource Strain: Low Risk   ? Difficulty of Paying Living Expenses: Not hard at all  ?Food Insecurity: No Food Insecurity  ? Worried About Charity fundraiser in the Last Year: Never true  ? Ran Out of Food in the Last Year: Never true  ?Housing: Not on file  ?Physical Activity: Inactive  ? Days of Exercise per Week: 0 days  ? Minutes of Exercise per Session: 0 min  ?Social Connections: Not on file  ?Stress: No Stress Concern Present  ? Feeling of Stress : Only a little  ?Tobacco Use: Low Risk   ? Smoking Tobacco Use: Never  ? Smokeless Tobacco Use: Never  ? Passive Exposure: Not on file  ?Transportation Needs: No Transportation Needs  ?  Lack of Transportation (Medical): No  ?  Lack of Transportation (Non-Medical): No  ? ? ?CCM Care Plan ? ?Allergies  ?Allergen Reactions  ? Dust Mite Extract Itching  ? Other   ?  grass  ? ? ?Medications Reviewed Today   ? ? Reviewed by Kellie Simmering, LPN (Licensed Practical Nurse) on 06/19/21 at [place] List Status: <None>  ? ?Medication Order Taking? Sig Documenting Provider Last Dose Status Informant  ?acetaminophen (TYLENOL) 500 MG tablet [id number] Yes Take 1,000 mg by mouth every 6 (six) hours as needed for moderate pain.  [provider] Taking Active Self  ?Ascorbic Acid (VITAMIN C) 1000 MG tablet [id number] Yes Take 1,000 mg by mouth daily. [provider] Taking Active Self  ?atorvastatin (LIPITOR) 20 MG tablet [id number] Yes Take 1 tablet (20 mg total) by mouth daily. Glendale Chard, MD Taking Active   ?calcium carbonate (OS-CAL) 600 MG TABS [id number] Yes Take 600 mg by mouth 2 (two) times daily with a meal.   [provider] Taking Active Self  ?Carboxymethylcellulose Sodium (REFRESH OP) [id number] Yes Apply 1 drop to eye 2 (two) times daily as needed. For dry eyes [provider] Taking Active Self  ?Cyanocobalamin (VITAMIN B-12) 2500 MCG SUBL [id number] Yes Place 1 tablet under the tongue daily. [provider] Taking Active Self  ?gabapentin (NEURONTIN) 100 MG capsule [id number] Yes Take 1 pill at bedtime as needed for leg pain. Ward Givens, NP Taking Active   ?glucose blood (CONTOUR NEXT TEST) test strip [id number] Yes Use as directed to check blood sugars 1 time per day dx: e11.65 Glendale Chard, MD Taking Active   ?hydrochlorothiazide (HYDRODIURIL) 25 MG tablet [id number] Yes Take 1 tablet by mouth once daily Glendale Chard, MD Taking Active   ?loratadine (CLARITIN) 10 MG tablet [id number] Yes Take 1 tablet (10 mg total) by mouth daily. Glendale Chard, MD Taking Active   ?losartan (COZAAR) 100 MG tablet [id number] Yes Take 1 tablet by mouth once daily  Glendale Chard, MD Taking Active   ?Magnesium 400 MG CAPS [id number] Yes Take by mouth daily.  [provider] Taking Active   ?metoprolol succinate (TOPROL-XL) 25 MG 24 hr tablet [id number] Yes Take

## 2021-10-16 ENCOUNTER — Telehealth: Payer: Self-pay | Admitting: Adult Health

## 2021-10-16 NOTE — Telephone Encounter (Signed)
Rescheduled appointment per provider template. Left message. ?

## 2021-10-18 DIAGNOSIS — I129 Hypertensive chronic kidney disease with stage 1 through stage 4 chronic kidney disease, or unspecified chronic kidney disease: Secondary | ICD-10-CM

## 2021-10-18 DIAGNOSIS — E782 Mixed hyperlipidemia: Secondary | ICD-10-CM

## 2021-10-18 MED ORDER — ATORVASTATIN CALCIUM 40 MG PO TABS: 40.0000 mg | ORAL_TABLET | Freq: Every day | ORAL | 3 refills | Status: DC

## 2021-10-18 NOTE — Patient Instructions (Signed)
Visit Information ?It was great speaking with you today!  Please let me know if you have any questions about our visit. ? ? Goals Addressed   ? ?  ?  ?  ?  ? This Visit's Progress  ?  Track and Manage My Blood Pressure-Hypertension     ?  Timeframe:  Long-Range Goal ?Priority:  High ?Start Date:                             ?Expected End Date:                      ? ?Follow Up Date 04/01/2022 ? ?In Progress: ?- check blood pressure 3 times per week ?- choose a place to take my blood pressure (home, clinic or office, retail store) ?- write blood pressure results in a log or diary  ?  ?Why is this important?   ?You won't feel high blood pressure, but it can still hurt your blood vessels.  ?High blood pressure can cause heart or kidney problems. It can also cause a stroke.  ?Making lifestyle changes like losing a little weight or eating less salt will help.  ?Checking your blood pressure at home and at different times of the day can help to control blood pressure.  ?If the doctor prescribes medicine remember to take it the way the doctor ordered.  ?Call the office if you cannot afford the medicine or if there are questions about it.   ?  ? ?  ? ?  ? ? ?Patient Care Plan: [place]  ?  ? ?Problem Identified: HTN, HLD   ?Priority: High  ?Onset Date: 04/16/2021  ?  ? ?Long-Range Goal: Disease Management   ?Start Date: 10/30/2020  ?Recent Progress: On track  ?Priority: High  ?Note:   ? ?Current Barriers:  ?Unable to independently monitor therapeutic efficacy ? ?Pharmacist Clinical Goal(s):  ?Patient will achieve adherence to monitoring guidelines and medication adherence to achieve therapeutic efficacy through collaboration with PharmD and provider.  ? ?Interventions: ?1:1 collaboration with Martha Chard, MD regarding development and update of comprehensive plan of care as evidenced by provider attestation and co-signature ?Inter-disciplinary care team collaboration (see longitudinal plan of  care) ?Comprehensive medication review performed; medication list updated in electronic medical record ? ?Hypertension (BP goal <130/80) ?-Controlled ?-Current treatment: ?Losartan 100 mg tablet once per day Appropriate, Effective, Safe, Accessible ?Metoprolol Succinate 25 mg tablet once per day Appropriate, Effective, Safe, Accessible ?Hydrochlorothiazide 25 mg tablet once per day  Appropriate, Effective, Safe, Accessible ?-Current home readings: 2 weeks ago, 133/78- afternoon after taking medication , 140/80 - in the morning reading before taking medication , 116/78 afternoon after taking medication  about a week apart, she is checking in the morning and around noon ?-Current dietary habits: she reports that her eating habits have not been doing to well.  ?-She is working on getting more involved and going to exercise  ?-Current exercise habits: she reports that she is unable to go to the [facility], and exercise and she is concerned about gaining too much weight  ?-Denies hypotensive/hypertensive symptoms ?-Educated on Importance of home blood pressure monitoring; ?Proper BP monitoring technique; ?-Counseled to monitor BP at home at least two times per week , document, and provide log at future appointments ?-Recommended to continue current medication ? ?Hyperlipidemia: (LDL goal < 70) ?-Controlled ?-Current treatment: ?Atorvastatin 20 mg tablet once per day Appropriate,  Query Effective ?-Current dietary patterns: Martha Pham is going to start exercising at the [facility]  ?-Current exercise habits: she was doing water aerobics prior to the pandemic. She would like to start going to do chair yoga on Tuesday and Thursdays. ?-Congratulated Martha Pham on taking her medication daily and not having any issues ?-Educated on Cholesterol goals;  ?Benefits of statin for ASCVD risk reduction; ?Importance of limiting foods high in cholesterol; ?-Recommended to continue current medication for now, I might recommend an increase ? ?Health  Maintenance ?-Vaccine gaps: Shingrix Vaccine - 2 doses required 2-6 months, TDAP - every 10 years -  Pneumonia Vaccine  patient has not received,  COVID-19 Booster - to receive at [facility] ?-Current therapy:  ?Vitamin D - taking 1 tablet daily  ?-Patient is satisfied with current therapy and denies issues ?-Collaborated with patient and PCP team to schedule Shingrix Vaccine for April 4 th, 2023.   ? ?*For assessment calls please call on Tuesdays or Thursdays, or leave a voicemail on her land line.  Please make sure to leave a detailed voicemail on the Martha Pham's cell phone.  ? ?Patient Goals/Self-Care Activities ?Patient will:  ?- take medications as prescribed as evidenced by patient report and record review ? ?Follow Up Plan: The patient has been provided with contact information for the care management team and has been advised to call with any health related questions or concerns.  ?  ? ? ? ?Patient agreed to services and verbal consent obtained.  ? ?The patient verbalized understanding of instructions, educational materials, and care plan provided today and agreed to receive a mailed copy of patient instructions, educational materials, and care plan.  ? ?Martha Pham, PharmD ?Clinical Pharmacist ?[facility] ?[phone] ?  ?

## 2021-10-18 NOTE — Addendum Note (Signed)
Addended by: Mayford Knife on: 10/18/2021 10:36 AM ? ? Modules accepted: Orders ? ?

## 2021-10-22 ENCOUNTER — Ambulatory Visit (INDEPENDENT_AMBULATORY_CARE_PROVIDER_SITE_OTHER): Payer: PPO

## 2021-10-22 VITALS — BP 120/68 | HR 77 | Temp 98.1°F | Ht 59.0 in | Wt 167.0 lb

## 2021-10-22 DIAGNOSIS — Z23 Encounter for immunization: Secondary | ICD-10-CM

## 2021-10-22 NOTE — Progress Notes (Signed)
Pt presents today for 1st shingrix.  ?

## 2021-11-13 ENCOUNTER — Encounter: Payer: Self-pay | Admitting: Adult Health

## 2021-11-13 ENCOUNTER — Ambulatory Visit: Payer: Medicare Other | Admitting: Adult Health

## 2021-11-13 VITALS — BP 130/78 | HR 67 | Ht 60.0 in | Wt 172.8 lb

## 2021-11-13 DIAGNOSIS — Z9989 Dependence on other enabling machines and devices: Secondary | ICD-10-CM | POA: Diagnosis not present

## 2021-11-13 DIAGNOSIS — G4733 Obstructive sleep apnea (adult) (pediatric): Secondary | ICD-10-CM

## 2021-11-13 NOTE — Progress Notes (Signed)
? ? ?PATIENT: Martha Pham ?DOB: 1950/11/12 ? ?REASON FOR VISIT: follow up ?HISTORY FROM: patient ?PRIMARY NEUROLOGIST: Dr. Rexene Alberts ? ? ?Chief Complaint  ?Patient presents with  ? Follow-up  ?  Pt in 8  pt states she has not used CPAP since Nov 2022  pt states that she is planning on starting using CPAP again .   ? ? ?HISTORY OF PRESENT ILLNESS: ?Today 11/13/21: ? ?[name] is a 71 year old female with a history of obstructive sleep apnea on CPAP.  She returns today for follow-up.  She reports that she has not been using the CPAP but plans on restarting. Reports that she left machine with her Daughter- then she had to mail it back. She then just got out of the habit of using it.  ? ? ? ?REVIEW OF SYSTEMS: Out of a complete 14 system review of symptoms, the patient complains only of the following symptoms, and all other reviewed systems are negative. ? ? ?ESS 5 ? ?ALLERGIES: ?Allergies  ?Allergen Reactions  ? Dust Mite Extract Itching  ? Other   ?  grass  ? ? ?HOME MEDICATIONS: ?Outpatient Medications Prior to Visit  ?Medication Sig Dispense Refill  ? acetaminophen (TYLENOL) 500 MG tablet Take 1,000 mg by mouth every 6 (six) hours as needed for moderate pain.     ? Ascorbic Acid (VITAMIN C) 1000 MG tablet Take 1,000 mg by mouth daily.    ? atorvastatin (LIPITOR) 40 MG tablet Take 1 tablet (40 mg total) by mouth daily. 90 tablet 3  ? calcium carbonate (OS-CAL) 600 MG TABS Take 600 mg by mouth 2 (two) times daily with a meal.      ? Carboxymethylcellulose Sodium (REFRESH OP) Apply 1 drop to eye 2 (two) times daily as needed. For dry eyes    ? Cyanocobalamin (VITAMIN B-12) 2500 MCG SUBL Place 1 tablet under the tongue daily.    ? gabapentin (NEURONTIN) 100 MG capsule Take 1 pill at bedtime as needed for leg pain. 90 capsule 3  ? glucose blood (CONTOUR NEXT TEST) test strip Use as directed to check blood sugars 1 time per day dx: e11.65 100 each 3  ? hydrochlorothiazide (HYDRODIURIL) 25 MG tablet Take 1 tablet by mouth  once daily 90 tablet 1  ? loratadine (CLARITIN) 10 MG tablet Take 1 tablet (10 mg total) by mouth daily. 30 tablet 2  ? losartan (COZAAR) 100 MG tablet Take 1 tablet by mouth once daily 90 tablet 0  ? Magnesium 400 MG CAPS Take by mouth daily.     ? metoprolol succinate (TOPROL-XL) 25 MG 24 hr tablet Take 1 tablet by mouth once daily 90 tablet 1  ? Microlet Lancets MISC Use as directed to check blood sugars 1 time per day dx: e11.65 100 each 3  ? polyethylene glycol-electrolytes (NULYTELY) 420 g solution See admin instructions.    ? Vitamin D, Cholecalciferol, 50 MCG (2000 UT) CAPS Take by mouth.     ? ?No facility-administered medications prior to visit.  ? ? ?PAST MEDICAL HISTORY: ?Past Medical History:  ?Diagnosis Date  ? Apnea, sleep   ? does wear cpap  ? Arthritis   ? Cancer ([place])   ? lt. breast ca-snbx  ? Chronic kidney disease   ? Contact lens/glasses fitting   ? wears contacts or glasses  ? COPD (chronic obstructive pulmonary disease) ([place])   ? brother  ? Heart murmur   ? Hypercholesteremia   ? Hyperlipemia   ? Hypertension   ?  Lower back pain   ? Lung cancer ([place])   ? Malaise and fatigue   ? Personal history of chemotherapy 2013  ? Sleep apnea   ? SLEEP STUDY DX 10,CPAP BUT DON'TUSE  ? Vitamin D deficiency   ? Wears dentures   ? upper  ? ? ?PAST SURGICAL HISTORY: ?Past Surgical History:  ?Procedure Laterality Date  ? BREAST BIOPSY Left 05/26/2011  ? malignant  ? BREAST BIOPSY Right 10/29/2016  ? BREAST IMPLANT EXCHANGE Left 09/23/2012  ? Procedure: REMOVAL OF LEFT EXPANDER/PLACEMENT OF IMPLANT WITH REDUCTION OF RIGHT BREAST;  Surgeon: Theodoro Kos, DO;  Location: [place];  Service: Plastics;  Laterality: Left;  ? BREAST LUMPECTOMY W/ NEEDLE LOCALIZATION  06/24/2011  ? left  ? BREAST REDUCTION SURGERY Right 09/23/2012  ? Procedure: MAMMARY REDUCTION  (BREAST);  Surgeon: Theodoro Kos, DO;  Location: [place];  Service: Plastics;  Laterality: Right;  ? BREAST SURGERY   08/07/2011  ? re-exc. left breast margins  ? CATARACT EXTRACTION Bilateral   ? january and february 2022  ? CYST REMOVAL NECK  08/2013  ? Dr. Migdalia Dk  ? LIPOSUCTION Bilateral 09/23/2012  ? Procedure: LIPOSUCTION;  Surgeon: Theodoro Kos, DO;  Location: [place];  Service: Plastics;  Laterality: Bilateral;  ? MASTECTOMY Left 2012  ? MOUTH SURGERY    ? TOOTH IMPLANT BOTTOM X2 LFT  ? PORT-A-CATH REMOVAL Right 09/23/2012  ? Procedure: REMOVAL PORT-A-CATH;  Surgeon: Theodoro Kos, DO;  Location: [place];  Service: Plastics;  Laterality: Right;  ? PORTACATH PLACEMENT  06/24/2011  ? Procedure: INSERTION PORT-A-CATH;  Surgeon: Judieth Keens, DO;  Location: [place];  Service: General;  Laterality: Right;  Right mediport placement  ? RECONSTRUCTION BREAST IMMEDIATE / DELAYED W/ TISSUE EXPANDER  04/2012  ? left  ? REDUCTION MAMMAPLASTY Right 2013  ? SIMPLE MASTECTOMY  09/01/2011  ? left  ? TONSILLECTOMY    ? ? ?FAMILY HISTORY: ?Family History  ?Problem Relation Age of Onset  ? Heart attack Mother   ? Brain cancer Father   ? Breast cancer Sister 38  ? Breast cancer Sister 30  ? Sleep apnea Neg Hx   ? ? ?SOCIAL HISTORY: ?Social History  ? ?Socioeconomic History  ? Marital status: Widowed  ?  Spouse name: Not on file  ? Number of children: 1  ? Years of education: 61  ? Highest education level: Not on file  ?Occupational History  ? Occupation: Retired  ?Tobacco Use  ? Smoking status: Never  ? Smokeless tobacco: Never  ?Vaping Use  ? Vaping Use: Never used  ?Substance and Sexual Activity  ? Alcohol use: Not Currently  ?  Alcohol/week: 2.0 standard drinks  ?  Types: 2 Glasses of wine per week  ?  Comment: 8 oz occasionally per week  ? Drug use: No  ? Sexual activity: Not Currently  ?  Partners: Male  ?  Birth control/protection: Post-menopausal  ?Other Topics Concern  ? Not on file  ?Social History Narrative  ? Daughter [place], [place]  ? ?Social Determinants of Health   ? ?Financial Resource Strain: Low Risk   ? Difficulty of Paying Living Expenses: Not hard at all  ?Food Insecurity: No Food Insecurity  ? Worried About Charity fundraiser in the Last Year: Never true  ? Ran Out of Food in the Last Year: Never true  ?Transportation Needs: No Transportation Needs  ? Lack of Transportation (Medical): No  ?  Lack of Transportation (Non-Medical): No  ?Physical Activity: Inactive  ? Days of Exercise per Week: 0 days  ? Minutes of Exercise per Session: 0 min  ?Stress: No Stress Concern Present  ? Feeling of Stress : Only a little  ?Social Connections: Not on file  ?Intimate Partner Violence: Not on file  ? ? ? ? ?PHYSICAL EXAM ? ?Vitals:  ? 11/13/21 1336  ?BP: 130/78  ?Pulse: 67  ?Weight: 172 lb 12.8 oz (78.4 kg)  ?Height: 5' (1.524 m)  ? ?Body mass index is 33.75 kg/m?. ? ?Generalized: Well developed, in no acute distress  ?Chest: Lungs clear to auscultation bilaterally ? ?Neurological examination  ?Mentation: Alert oriented to time, place, history taking. Follows all commands speech and language fluent ?Cranial nerve II-XII: Extraocular movements were full, visual field were full on confrontational test. ?Gait and station: Gait is normal.  ? ? ?DIAGNOSTIC DATA (LABS, IMAGING, TESTING) ?- I reviewed patient records, labs, notes, testing and imaging myself where available. ? ?Lab Results  ?Component Value Date  ? WBC 5.0 06/06/2021  ? HGB 11.8 06/06/2021  ? HCT 34.5 06/06/2021  ? MCV 93 06/06/2021  ? PLT 200 06/06/2021  ? ?   ?Component Value Date/Time  ? NA 139 06/06/2021 0939  ? NA 142 04/03/2015 1109  ? K 4.2 06/06/2021 0939  ? K 3.1 (L) 04/03/2015 1109  ? CL 102 06/06/2021 0939  ? CL 106 08/17/2012 1351  ? CO2 23 06/06/2021 0939  ? CO2 24 04/03/2015 1109  ? GLUCOSE 131 (H) 06/06/2021 2800  ? GLUCOSE 135 (H) 02/26/2018 1433  ? GLUCOSE 112 04/03/2015 1109  ? GLUCOSE 116 (H) 08/17/2012 1351  ? BUN 20 06/06/2021 0939  ? BUN 7.9 04/03/2015 1109  ? CREATININE 0.94 06/06/2021 0939  ?  CREATININE 0.8 04/03/2015 1109  ? CALCIUM 10.0 06/06/2021 0939  ? CALCIUM 9.7 04/03/2015 1109  ? PROT 7.5 06/06/2021 0939  ? PROT 7.4 04/03/2015 1109  ? ALBUMIN 4.6 06/06/2021 0939  ? ALBUMIN 3.9 04/03/2015 1109  ? AST

## 2021-11-13 NOTE — Patient Instructions (Signed)
Restart using CPAP nightly and greater than 4 hours each night ?If your symptoms worsen or you develop new symptoms please let us know.  ? ?

## 2021-11-28 ENCOUNTER — Ambulatory Visit (INDEPENDENT_AMBULATORY_CARE_PROVIDER_SITE_OTHER): Payer: Medicare Other | Admitting: Internal Medicine

## 2021-11-28 ENCOUNTER — Encounter: Payer: Self-pay | Admitting: Internal Medicine

## 2021-11-28 VITALS — BP 140/90 | HR 65 | Temp 97.9°F | Ht 60.0 in | Wt 171.2 lb

## 2021-11-28 DIAGNOSIS — I129 Hypertensive chronic kidney disease with stage 1 through stage 4 chronic kidney disease, or unspecified chronic kidney disease: Secondary | ICD-10-CM | POA: Diagnosis not present

## 2021-11-28 DIAGNOSIS — Z23 Encounter for immunization: Secondary | ICD-10-CM | POA: Diagnosis not present

## 2021-11-28 DIAGNOSIS — E6609 Other obesity due to excess calories: Secondary | ICD-10-CM | POA: Diagnosis not present

## 2021-11-28 DIAGNOSIS — E1122 Type 2 diabetes mellitus with diabetic chronic kidney disease: Secondary | ICD-10-CM | POA: Diagnosis not present

## 2021-11-28 DIAGNOSIS — N182 Chronic kidney disease, stage 2 (mild): Secondary | ICD-10-CM

## 2021-11-28 DIAGNOSIS — E2839 Other primary ovarian failure: Secondary | ICD-10-CM

## 2021-11-28 DIAGNOSIS — Z6833 Body mass index (BMI) 33.0-33.9, adult: Secondary | ICD-10-CM

## 2021-11-28 MED ORDER — HYDROCHLOROTHIAZIDE 25 MG PO TABS: 25.0000 mg | ORAL_TABLET | Freq: Every day | ORAL | 1 refills | Status: DC

## 2021-11-28 MED ORDER — LOSARTAN POTASSIUM 100 MG PO TABS: 100.0000 mg | ORAL_TABLET | Freq: Every day | ORAL | 1 refills | Status: DC

## 2021-11-28 MED ORDER — METOPROLOL SUCCINATE ER 25 MG PO TB24
25.0000 mg | ORAL_TABLET | Freq: Every day | ORAL | 1 refills | Status: DC
Start: 2021-11-28 — End: 2021-12-26

## 2021-11-28 NOTE — Progress Notes (Signed)
?[name],acting as a Education administrator for [name], MD.,have documented all relevant documentation on the behalf of [name], MD,as directed by  [name], MD while in the presence of [name], MD.  ?This visit occurred during the SARS-CoV-2 public health emergency.  Safety protocols were in place, including screening questions prior to the visit, additional usage of staff PPE, and extensive cleaning of exam room while observing appropriate contact time as indicated for disinfecting solutions. ? ?Subjective:  ?  ? Patient ID: Martha Pham , female    DOB: 26-Sep-1950 , 71 y.o.   MRN: [id number] ? ? ?Chief Complaint  ?Patient presents with  ? Hypertension  ? Diabetes  ? ? ?HPI ? ?She is here today for a blood pressure f/u.  She reports compliance with meds. She denies headaches, chest pain and shortness of breath. Patient reports her blood sugars have been running high.  ? ?Hypertension ?This is a chronic problem. The current episode started more than 1 year ago. The problem has been gradually improving since onset. The problem is controlled. Risk factors for coronary artery disease include diabetes mellitus, dyslipidemia, post-menopausal state, obesity and sedentary lifestyle. The current treatment provides moderate improvement. Compliance problems include exercise.   ?Diabetes ?She presents for her follow-up diabetic visit. She has type 2 diabetes mellitus. Her disease course has been improving. There are no hypoglycemic associated symptoms. There are no diabetic associated symptoms. There are no hypoglycemic complications.   ? ?Past Medical History:  ?Diagnosis Date  ? Apnea, sleep   ? does wear cpap  ? Arthritis   ? Cancer ([place])   ? lt. breast ca-snbx  ? Chronic kidney disease   ? Contact lens/glasses fitting   ? wears contacts or glasses  ? COPD (chronic obstructive pulmonary disease) ([place])   ? brother  ? Heart murmur   ? Hypercholesteremia   ? Hyperlipemia   ? Hypertension   ?  Lower back pain   ? Lung cancer ([place])   ? Malaise and fatigue   ? Personal history of chemotherapy 2013  ? Sleep apnea   ? SLEEP STUDY DX 10,CPAP BUT DON'TUSE  ? Vitamin D deficiency   ? Wears dentures   ? upper  ?  ? ?Family History  ?Problem Relation Age of Onset  ? Heart attack Mother   ? Brain cancer Father   ? Breast cancer Sister 56  ? Breast cancer Sister 29  ? Sleep apnea Neg Hx   ? ? ? ?Current Outpatient Medications:  ?  acetaminophen (TYLENOL) 500 MG tablet, Take 1,000 mg by mouth every 6 (six) hours as needed for moderate pain. , Disp: , Rfl:  ?  Ascorbic Acid (VITAMIN C) 1000 MG tablet, Take 1,000 mg by mouth daily., Disp: , Rfl:  ?  atorvastatin (LIPITOR) 40 MG tablet, Take 1 tablet (40 mg total) by mouth daily., Disp: 90 tablet, Rfl: 3 ?  calcium carbonate (OS-CAL) 600 MG TABS, Take 600 mg by mouth 2 (two) times daily with a meal.  , Disp: , Rfl:  ?  Carboxymethylcellulose Sodium (REFRESH OP), Apply 1 drop to eye 2 (two) times daily as needed. For dry eyes, Disp: , Rfl:  ?  Cyanocobalamin (VITAMIN B-12) 2500 MCG SUBL, Place 1 tablet under the tongue daily., Disp: , Rfl:  ?  gabapentin (NEURONTIN) 100 MG capsule, Take 1 pill at bedtime as needed for leg pain., Disp: 90 capsule, Rfl: 3 ?  glucose blood (CONTOUR  NEXT TEST) test strip, Use as directed to check blood sugars 1 time per day dx: e11.65, Disp: 100 each, Rfl: 3 ?  loratadine (CLARITIN) 10 MG tablet, Take 1 tablet (10 mg total) by mouth daily., Disp: 30 tablet, Rfl: 2 ?  Magnesium 400 MG CAPS, Take by mouth daily. , Disp: , Rfl:  ?  Microlet Lancets MISC, Use as directed to check blood sugars 1 time per day dx: e11.65, Disp: 100 each, Rfl: 3 ?  Vitamin D, Cholecalciferol, 50 MCG (2000 UT) CAPS, Take by mouth. , Disp: , Rfl:  ?  hydrochlorothiazide (HYDRODIURIL) 25 MG tablet, Take 1 tablet (25 mg total) by mouth daily., Disp: 90 tablet, Rfl: 1 ?  losartan (COZAAR) 100 MG tablet, Take 1 tablet (100 mg total) by mouth daily., Disp: 90 tablet, Rfl:  1 ?  metoprolol succinate (TOPROL-XL) 25 MG 24 hr tablet, Take 1 tablet (25 mg total) by mouth daily., Disp: 90 tablet, Rfl: 1  ? ?Allergies  ?Allergen Reactions  ? Dust Mite Extract Itching  ? Other   ?  grass  ?  ? ?Review of Systems  ?Constitutional: Negative.   ?Respiratory: Negative.    ?Cardiovascular: Negative.   ?Gastrointestinal: Negative.   ?Neurological: Negative.   ?Psychiatric/Behavioral: Negative.     ? ?Today's Vitals  ? 11/28/21 0913 11/28/21 1001  ?BP: 140/72 140/90  ?Pulse: 65   ?Temp: 97.9 ?F (36.6 ?C)   ?Weight: 171 lb 3.2 oz (77.7 kg)   ?Height: 5' (1.524 m)   ?PainSc: 0-No pain   ? ?Body mass index is 33.44 kg/m?.  ?Wt Readings from Last 3 Encounters:  ?11/28/21 171 lb 3.2 oz (77.7 kg)  ?11/13/21 172 lb 12.8 oz (78.4 kg)  ?10/22/21 167 lb (75.8 kg)  ?  ?BP Readings from Last 3 Encounters:  ?11/28/21 140/90  ?11/13/21 130/78  ?10/22/21 120/68  ? ? ? ?Objective:  ?Physical Exam ?Vitals and nursing note reviewed.  ?Constitutional:   ?   Appearance: Normal appearance.  ?HENT:  ?   Head: Normocephalic and atraumatic.  ?Eyes:  ?   Extraocular Movements: Extraocular movements intact.  ?Cardiovascular:  ?   Rate and Rhythm: Normal rate and regular rhythm.  ?   Heart sounds: Normal heart sounds.  ?Pulmonary:  ?   Effort: Pulmonary effort is normal.  ?   Breath sounds: Normal breath sounds.  ?Musculoskeletal:  ?   Cervical back: Normal range of motion.  ?Skin: ?   General: Skin is warm.  ?Neurological:  ?   General: No focal deficit present.  ?   Mental Status: She is alert.  ?Psychiatric:     ?   Mood and Affect: Mood normal.     ?   Behavior: Behavior normal.  ?   ?Assessment And Plan:  ?   ?1. Parenchymal renal hypertension, stage 1 through stage 4 or unspecified chronic kidney disease ?Comments: Chronic, fair control. Repeat BP persistently elevated. Encouraged to incorporate more exercise into her daily routine. F/u 4 wks for nurse visit.  ?- CMP14+EGFR ?- Lipid panel ? ?2. Diabetes mellitus with  stage 2 chronic kidney disease ([place]) ?Comments: Chronic, I will check labs as listed below. She is encouraged to incorporate more exercise into her daily routine. She will f/u in 4 months.  ?- Hemoglobin A1c ?- CMP14+EGFR ?- Lipid panel ? ?3. Estrogen deficiency ?Comments: She agrees to bone density referral. [facility] Last one performed 2018 w/ GYN ([facility]). I  will request copy.  ?- DG Bone Density; Future ? ?4. Class 1 obesity due to excess calories with serious comorbidity and body mass index (BMI) of 33.0 to 33.9 in adult ?Comments: She is encouraged to strive for BMI<30 to decrease cardiac risk. Advised to aim for at least 150 minutes of exercise per week.  ? ?5. Immunization due ?- Pneumococcal conjugate vaccine 20-valent (Prevnar 20) ?  ?Patient was given opportunity to ask questions. Patient verbalized understanding of the plan and was able to repeat key elements of the plan. All questions were answered to their satisfaction.  ? ?I, [name], MD, have reviewed all documentation for this visit. The documentation on 11/28/21 for the exam, diagnosis, procedures, and orders are all accurate and complete.  ? ?IF YOU HAVE BEEN REFERRED TO A SPECIALIST, IT MAY TAKE 1-2 WEEKS TO SCHEDULE/PROCESS THE REFERRAL. IF YOU HAVE NOT HEARD FROM US/SPECIALIST IN TWO WEEKS, PLEASE GIVE [place] A CALL AT [phone] X [phone].  ? ?THE PATIENT IS ENCOURAGED TO PRACTICE SOCIAL DISTANCING DUE TO THE COVID-19 PANDEMIC.   ?

## 2021-11-28 NOTE — Patient Instructions (Signed)

## 2021-11-29 ENCOUNTER — Encounter: Payer: Self-pay | Admitting: Adult Health

## 2021-11-29 ENCOUNTER — Inpatient Hospital Stay: Payer: Medicare Other | Attending: Adult Health | Admitting: Adult Health

## 2021-11-29 ENCOUNTER — Other Ambulatory Visit: Payer: Self-pay

## 2021-11-29 VITALS — BP 156/77 | HR 80 | Temp 97.9°F | Resp 16 | Ht 60.0 in | Wt 171.1 lb

## 2021-11-29 DIAGNOSIS — N189 Chronic kidney disease, unspecified: Secondary | ICD-10-CM | POA: Diagnosis not present

## 2021-11-29 DIAGNOSIS — I129 Hypertensive chronic kidney disease with stage 1 through stage 4 chronic kidney disease, or unspecified chronic kidney disease: Secondary | ICD-10-CM | POA: Insufficient documentation

## 2021-11-29 DIAGNOSIS — Z853 Personal history of malignant neoplasm of breast: Secondary | ICD-10-CM | POA: Diagnosis not present

## 2021-11-29 DIAGNOSIS — Z803 Family history of malignant neoplasm of breast: Secondary | ICD-10-CM | POA: Insufficient documentation

## 2021-11-29 LAB — CMP14+EGFR
ALT: 15 IU/L (ref 0–32)
AST: 14 IU/L (ref 0–40)
Albumin/Globulin Ratio: 1.6 (ref 1.2–2.2)
Albumin: 4.5 g/dL (ref 3.7–4.7)
Alkaline Phosphatase: 76 IU/L (ref 44–121)
BUN/Creatinine Ratio: 15 (ref 12–28)
BUN: 16 mg/dL (ref 8–27)
Bilirubin Total: 0.4 mg/dL (ref 0.0–1.2)
CO2: 23 mmol/L (ref 20–29)
Calcium: 9.7 mg/dL (ref 8.7–10.3)
Chloride: 105 mmol/L (ref 96–106)
Creatinine, Ser: 1.07 mg/dL — ABNORMAL HIGH (ref 0.57–1.00)
Globulin, Total: 2.8 g/dL (ref 1.5–4.5)
Glucose: 183 mg/dL — ABNORMAL HIGH (ref 70–99)
Potassium: 3.8 mmol/L (ref 3.5–5.2)
Sodium: 142 mmol/L (ref 134–144)
Total Protein: 7.3 g/dL (ref 6.0–8.5)
eGFR: 55 mL/min/{1.73_m2} — ABNORMAL LOW (ref >59)

## 2021-11-29 LAB — HEMOGLOBIN A1C
Est. average glucose Bld gHb Est-mCnc: 157 mg/dL
Hgb A1c MFr Bld: 7.1 % — ABNORMAL HIGH (ref 4.8–5.6)

## 2021-11-29 LAB — LIPID PANEL
Chol/HDL Ratio: 3.5 ratio (ref 0.0–4.4)
Cholesterol, Total: 180 mg/dL (ref 100–199)
HDL: 51 mg/dL (ref >39)
LDL Chol Calc (NIH): 112 mg/dL — ABNORMAL HIGH (ref 0–99)
Triglycerides: 94 mg/dL (ref 0–149)
VLDL Cholesterol Cal: 17 mg/dL (ref 5–40)

## 2021-11-29 NOTE — Progress Notes (Signed)
? ?CLINIC:  ?Survivorship  ? ?REASON FOR VISIT:  ?Routine follow-up for history of breast cancer.  ? ?BRIEF ONCOLOGIC HISTORY:  ?Oncology History  ?Breast cancer of upper-inner quadrant of left female breast ([place]) (Resolved)  ?06/12/2011 Initial Diagnosis  ? Cancer of upper-inner quadrant of female breast: Grade 3 IDC ER negative PR negative HER-2 negative, MRI 2.7 x 2.3 x 1 cm mass ? ?  ?06/24/2011 Surgery  ? Left breast lumpectomy 2.2 cm IDC grade 3 with high-grade DCIS triple negative, for positive margins patient had reexcision: 2 lymph nodes negative ER/PR negative HER-2 negative ratio 0.97 Ki-67 75% ?  ?09/02/2011 Surgery  ? Left mastectomy ? ?  ?10/01/2011 - 12/03/2011 Chemotherapy  ? Adjuvant Taxotere and Cytoxan x4 cycles ? ?  ?07/05/2012 Surgery  ? Breast reconstruction on the left breast with reduction of right breast ? ?  ? ? ? ?INTERVAL HISTORY:  ?Martha Pham presents to the [facility] today for routine follow-up for her history of breast cancer.  ? ?Her most recent right breast mammogram was completed on September 12, 2021 and showed no mammographic evidence of malignancy and breast density category A.  A screening mammogram in 1 year was recommended. ? ?Martha Pham tells me that she is doing well.  She has no significant issues.  She continues to see primary care regularly and is up-to-date with her cancer screenings. ? ? ?REVIEW OF SYSTEMS:  ?Review of Systems  ?Constitutional:  Negative for appetite change, chills, fatigue, fever and unexpected weight change.  ?HENT:   Negative for hearing loss, lump/mass, mouth sores, sore throat and trouble swallowing.   ?Eyes:  Negative for eye problems and icterus.  ?Respiratory:  Negative for chest tightness, cough and shortness of breath.   ?Cardiovascular:  Negative for chest pain, leg swelling and palpitations.  ?Gastrointestinal:  Negative for abdominal distention, abdominal pain, constipation, diarrhea, nausea and vomiting.  ?Endocrine: Negative for hot  flashes.  ?Genitourinary:  Negative for difficulty urinating.   ?Musculoskeletal:  Negative for arthralgias.  ?Skin:  Negative for itching and rash.  ?Neurological:  Negative for dizziness, extremity weakness, headaches and numbness.  ?Hematological:  Negative for adenopathy. Does not bruise/bleed easily.  ?Psychiatric/Behavioral:  Negative for depression. The patient is not nervous/anxious.  Breast: Denies any new nodularity, masses, tenderness, nipple changes, or nipple discharge.  ? ? ? ?PAST MEDICAL/SURGICAL HISTORY:  ?Past Medical History:  ?Diagnosis Date  ? Apnea, sleep   ? does wear cpap  ? Arthritis   ? Cancer ([place])   ? lt. breast ca-snbx  ? Chronic kidney disease   ? Contact lens/glasses fitting   ? wears contacts or glasses  ? COPD (chronic obstructive pulmonary disease) ([place])   ? brother  ? Heart murmur   ? Hypercholesteremia   ? Hyperlipemia   ? Hypertension   ? Lower back pain   ? Lung cancer ([place])   ? Malaise and fatigue   ? Personal history of chemotherapy 2013  ? Sleep apnea   ? SLEEP STUDY DX 10,CPAP BUT DON'TUSE  ? Vitamin D deficiency   ? Wears dentures   ? upper  ? ?Past Surgical History:  ?Procedure Laterality Date  ? BREAST BIOPSY Left 05/26/2011  ? malignant  ? BREAST BIOPSY Right 10/29/2016  ? BREAST IMPLANT EXCHANGE Left 09/23/2012  ? Procedure: REMOVAL OF LEFT EXPANDER/PLACEMENT OF IMPLANT WITH REDUCTION OF RIGHT BREAST;  Surgeon: Theodoro Kos, DO;  Location: [facility];  Service: Plastics;  Laterality: Left;  ? BREAST  LUMPECTOMY W/ NEEDLE LOCALIZATION  06/24/2011  ? left  ? BREAST REDUCTION SURGERY Right 09/23/2012  ? Procedure: MAMMARY REDUCTION  (BREAST);  Surgeon: Theodoro Kos, DO;  Location: [facility];  Service: Plastics;  Laterality: Right;  ? BREAST SURGERY  08/07/2011  ? re-exc. left breast margins  ? CATARACT EXTRACTION Bilateral   ? january and february 2022  ? CYST REMOVAL NECK  08/2013  ? Dr. Migdalia Dk  ? LIPOSUCTION Bilateral 09/23/2012  ?  Procedure: LIPOSUCTION;  Surgeon: Theodoro Kos, DO;  Location: [facility];  Service: Plastics;  Laterality: Bilateral;  ? MASTECTOMY Left 2012  ? MOUTH SURGERY    ? TOOTH IMPLANT BOTTOM X2 LFT  ? PORT-A-CATH REMOVAL Right 09/23/2012  ? Procedure: REMOVAL PORT-A-CATH;  Surgeon: Theodoro Kos, DO;  Location: [facility];  Service: Plastics;  Laterality: Right;  ? PORTACATH PLACEMENT  06/24/2011  ? Procedure: INSERTION PORT-A-CATH;  Surgeon: Judieth Keens, DO;  Location: [facility];  Service: General;  Laterality: Right;  Right mediport placement  ? RECONSTRUCTION BREAST IMMEDIATE / DELAYED W/ TISSUE EXPANDER  04/2012  ? left  ? REDUCTION MAMMAPLASTY Right 2013  ? SIMPLE MASTECTOMY  09/01/2011  ? left  ? TONSILLECTOMY    ? ? ? ?ALLERGIES:  ?Allergies  ?Allergen Reactions  ? Dust Mite Extract Itching  ? Other   ?  grass  ? ? ? ?CURRENT MEDICATIONS:  ?Outpatient Encounter Medications as of 11/29/2021  ?Medication Sig  ? acetaminophen (TYLENOL) 500 MG tablet Take 1,000 mg by mouth every 6 (six) hours as needed for moderate pain.   ? Ascorbic Acid (VITAMIN C) 1000 MG tablet Take 1,000 mg by mouth daily.  ? atorvastatin (LIPITOR) 40 MG tablet Take 1 tablet (40 mg total) by mouth daily.  ? calcium carbonate (OS-CAL) 600 MG TABS Take 600 mg by mouth 2 (two) times daily with a meal.    ? Carboxymethylcellulose Sodium (REFRESH OP) Apply 1 drop to eye 2 (two) times daily as needed. For dry eyes  ? Cyanocobalamin (VITAMIN B-12) 2500 MCG SUBL Place 1 tablet under the tongue daily.  ? gabapentin (NEURONTIN) 100 MG capsule Take 1 pill at bedtime as needed for leg pain.  ? glucose blood (CONTOUR NEXT TEST) test strip Use as directed to check blood sugars 1 time per day dx: e11.65  ? hydrochlorothiazide (HYDRODIURIL) 25 MG tablet Take 1 tablet (25 mg total) by mouth daily.  ? loratadine (CLARITIN) 10 MG tablet Take 1 tablet (10 mg total) by mouth daily.  ? losartan (COZAAR) 100 MG  tablet Take 1 tablet (100 mg total) by mouth daily.  ? Magnesium 400 MG CAPS Take by mouth daily.   ? metoprolol succinate (TOPROL-XL) 25 MG 24 hr tablet Take 1 tablet (25 mg total) by mouth daily.  ? Microlet Lancets MISC Use as directed to check blood sugars 1 time per day dx: e11.65  ? Vitamin D, Cholecalciferol, 50 MCG (2000 UT) CAPS Take by mouth.   ? ?No facility-administered encounter medications on file as of 11/29/2021.  ? ? ? ?ONCOLOGIC FAMILY HISTORY:  ?Family History  ?Problem Relation Age of Onset  ? Heart attack Mother   ? Brain cancer Father   ? Breast cancer Sister 34  ? Breast cancer Sister 35  ? Sleep apnea Neg Hx   ? ? ?GENETIC COUNSELING/TESTING: ?Done at [facility], negative ? ?SOCIAL HISTORY:  ?Martha Pham is single and lives alone in [place], [place].  Martha Pham is currently retired.  She denies any current or history of tobacco, alcohol, or illicit drug use.  She has been traveling to [place] to help with her grandson.  (May 2023 updated) ? ? ?PHYSICAL EXAMINATION:  ?Vital Signs: ?Vitals:  ? 11/29/21 0913  ?BP: (!) 156/77  ?Pulse: 80  ?Resp: 16  ?Temp: 97.9 ?F (36.6 ?C)  ?SpO2: 99%  ? ? ?Filed Weights  ? 11/29/21 0913  ?Weight: 171 lb 1.6 oz (77.6 kg)  ? ? ?General: Well-nourished, well-appearing female in no acute distress.  Unaccompanied today.   ?HEENT: Head is normocephalic.  Pupils equal and reactive to light. Conjunctivae clear without exudate.  Sclerae anicteric. Oral mucosa is pink, moist.  Oropharynx is pink without lesions or erythema.  ?Lymph: No cervical, supraclavicular, or infraclavicular lymphadenopathy noted on palpation.  ?Cardiovascular: Regular rate and rhythm.Marland Kitchen ?Respiratory: Clear to auscultation bilaterally. Chest expansion symmetric; breathing non-labored.  ?Breast Exam:  ?-Left breast: s/p mastectomy and reconstruction no sign of local recurrence ?-right breast: s/p reduction, scars present, but no nodules/masses or signs of cancer. ?-Axilla: No axillary  adenopathy bilaterally.  ?GI: Abdomen soft and round; non-tender, non-distended. Bowel sounds normoactive. No hepatosplenomegaly.   ?GU: Deferred.  ?Neuro: No focal deficits. Steady gait.  ?Psych: Mood and affect normal an

## 2021-12-02 ENCOUNTER — Encounter: Payer: Self-pay | Admitting: Adult Health

## 2021-12-03 ENCOUNTER — Ambulatory Visit: Payer: Medicare Other

## 2021-12-03 VITALS — BP 130/78 | HR 72 | Temp 98.1°F | Ht 60.0 in | Wt 171.0 lb

## 2021-12-03 DIAGNOSIS — E1122 Type 2 diabetes mellitus with diabetic chronic kidney disease: Secondary | ICD-10-CM

## 2021-12-03 NOTE — Progress Notes (Signed)
Pt presents today for ozempic teaching.  ?

## 2021-12-09 ENCOUNTER — Ambulatory Visit: Payer: PPO | Admitting: Internal Medicine

## 2021-12-25 LAB — HM COLONOSCOPY

## 2021-12-26 ENCOUNTER — Other Ambulatory Visit: Payer: Self-pay | Admitting: Internal Medicine

## 2021-12-30 ENCOUNTER — Other Ambulatory Visit: Payer: Self-pay

## 2021-12-30 MED ORDER — OZEMPIC (0.25 OR 0.5 MG/DOSE) 2 MG/1.5ML ~~LOC~~ SOPN: 0.5000 mg | PEN_INJECTOR | SUBCUTANEOUS | 0 refills | Status: DC

## 2021-12-31 ENCOUNTER — Other Ambulatory Visit: Payer: Self-pay

## 2021-12-31 ENCOUNTER — Ambulatory Visit: Payer: Medicare Other

## 2021-12-31 MED ORDER — OZEMPIC (0.25 OR 0.5 MG/DOSE) 2 MG/3ML ~~LOC~~ SOPN: 0.5000 mg | PEN_INJECTOR | SUBCUTANEOUS | 1 refills | Status: AC

## 2022-01-07 ENCOUNTER — Ambulatory Visit
Admission: RE | Admit: 2022-01-07 | Discharge: 2022-01-07 | Disposition: A | Discharge status: Home / Self Care | Payer: Medicare Other | Source: Ambulatory Visit | Attending: Internal Medicine | Admitting: Internal Medicine

## 2022-01-07 DIAGNOSIS — E2839 Other primary ovarian failure: Secondary | ICD-10-CM

## 2022-01-27 ENCOUNTER — Ambulatory Visit (INDEPENDENT_AMBULATORY_CARE_PROVIDER_SITE_OTHER): Payer: Medicare Other | Admitting: Internal Medicine

## 2022-01-27 ENCOUNTER — Encounter: Payer: Self-pay | Admitting: Internal Medicine

## 2022-01-27 VITALS — BP 120/74 | HR 68 | Temp 98.2°F | Ht 60.0 in | Wt 164.4 lb

## 2022-01-27 DIAGNOSIS — Z6832 Body mass index (BMI) 32.0-32.9, adult: Secondary | ICD-10-CM

## 2022-01-27 DIAGNOSIS — E7800 Pure hypercholesterolemia, unspecified: Secondary | ICD-10-CM

## 2022-01-27 DIAGNOSIS — E6609 Other obesity due to excess calories: Secondary | ICD-10-CM

## 2022-01-27 DIAGNOSIS — N182 Chronic kidney disease, stage 2 (mild): Secondary | ICD-10-CM

## 2022-01-27 DIAGNOSIS — I129 Hypertensive chronic kidney disease with stage 1 through stage 4 chronic kidney disease, or unspecified chronic kidney disease: Secondary | ICD-10-CM

## 2022-01-27 DIAGNOSIS — E1122 Type 2 diabetes mellitus with diabetic chronic kidney disease: Secondary | ICD-10-CM

## 2022-01-27 NOTE — Progress Notes (Signed)
Martha Pham,acting as a Education administrator for Martha Greenland, MD.,have documented all relevant documentation on the behalf of Martha Greenland, MD,as directed by  Martha Greenland, MD while in the presence of Martha Greenland, MD.    Subjective:     Patient ID: Martha Pham , female    DOB: August 27, 1950 , 71 y.o.   MRN: [id number]   Chief Complaint  Patient presents with   Hypertension   Diabetes    HPI  She is here today for a blood pressure f/u.  She reports compliance with meds. She denies headaches, chest pain and shortness of breath.   Hypertension This is a chronic problem. The current episode started more than 1 year ago. The problem has been gradually improving since onset. The problem is controlled. Risk factors for coronary artery disease include diabetes mellitus, dyslipidemia, post-menopausal state, obesity and sedentary lifestyle. The current treatment provides moderate improvement. Compliance problems include exercise.   Diabetes She presents for her follow-up diabetic visit. She has type 2 diabetes mellitus. Her disease course has been improving. There are no hypoglycemic associated symptoms. Pertinent negatives for diabetes include no polydipsia, no polyphagia and no polyuria. There are no hypoglycemic complications.     Past Medical History:  Diagnosis Date   Apnea, sleep    does wear cpap   Arthritis    Cancer ([place])    lt. breast ca-snbx   Chronic kidney disease    Contact lens/glasses fitting    wears contacts or glasses   COPD (chronic obstructive pulmonary disease) ([facility])    brother   Heart murmur    Hypercholesteremia    Hyperlipemia    Hypertension    Lower back pain    Lung cancer ([place])    Malaise and fatigue    Personal history of chemotherapy 2013   Sleep apnea    SLEEP STUDY DX 10,CPAP BUT DON'TUSE   Vitamin D deficiency    Wears dentures    upper     Family History  Problem Relation Age of Onset   Heart attack Mother    Brain cancer Father     Breast cancer Sister 38   Breast cancer Sister 4   Sleep apnea Neg Hx      Current Outpatient Medications:    acetaminophen (TYLENOL) 500 MG tablet, Take 1,000 mg by mouth every 6 (six) hours as needed for moderate pain. , Disp: , Rfl:    Ascorbic Acid (VITAMIN C) 1000 MG tablet, Take 1,000 mg by mouth daily., Disp: , Rfl:    atorvastatin (LIPITOR) 40 MG tablet, Take 1 tablet (40 mg total) by mouth daily., Disp: 90 tablet, Rfl: 3   calcium carbonate (OS-CAL) 600 MG TABS, Take 600 mg by mouth 2 (two) times daily with a meal.  , Disp: , Rfl:    Carboxymethylcellulose Sodium (REFRESH OP), Apply 1 drop to eye 2 (two) times daily as needed. For dry eyes, Disp: , Rfl:    Cyanocobalamin (VITAMIN B-12) 2500 MCG SUBL, Place 1 tablet under the tongue daily., Disp: , Rfl:    gabapentin (NEURONTIN) 100 MG capsule, Take 1 pill at bedtime as needed for leg pain., Disp: 90 capsule, Rfl: 3   glucose blood (CONTOUR NEXT TEST) test strip, Use as directed to check blood sugars 1 time per day dx: e11.65, Disp: 100 each, Rfl: 3   hydrochlorothiazide (HYDRODIURIL) 25 MG tablet, Take 1 tablet (25 mg total) by mouth daily., Disp: 90 tablet, Rfl: 1  loratadine (CLARITIN) 10 MG tablet, Take 1 tablet (10 mg total) by mouth daily., Disp: 30 tablet, Rfl: 2   losartan (COZAAR) 100 MG tablet, Take 1 tablet (100 mg total) by mouth daily., Disp: 90 tablet, Rfl: 1   Magnesium 400 MG CAPS, Take by mouth daily. , Disp: , Rfl:    metoprolol succinate (TOPROL-XL) 25 MG 24 hr tablet, Take 1 tablet by mouth once daily, Disp: 90 tablet, Rfl: 0   Microlet Lancets MISC, Use as directed to check blood sugars 1 time per day dx: e11.65, Disp: 100 each, Rfl: 3   Semaglutide,0.25 or 0.5MG /DOS, (OZEMPIC, 0.25 OR 0.5 MG/DOSE,) 2 MG/3ML SOPN, Inject 0.5 mg into the skin once a week., Disp: 9 mL, Rfl: 1   Vitamin D, Cholecalciferol, 50 MCG (2000 UT) CAPS, Take by mouth. , Disp: , Rfl:    Allergies  Allergen Reactions   Dust Mite  Extract Itching   Other     grass     Review of Systems  Constitutional: Negative.   Respiratory: Negative.    Cardiovascular: Negative.   Endocrine: Negative for polydipsia, polyphagia and polyuria.  Neurological: Negative.   Psychiatric/Behavioral: Negative.       Today's Vitals   01/27/22 1222  BP: 120/74  Pulse: 68  Temp: 98.2 F (36.8 C)  Weight: 164 lb 6.4 oz (74.6 kg)  Height: 5' (1.524 m)  PainSc: 0-No pain   Body mass index is 32.11 kg/m.  Wt Readings from Last 3 Encounters:  02/03/22 164 lb (74.4 kg)  01/27/22 164 lb 6.4 oz (74.6 kg)  12/03/21 171 lb (77.6 kg)    BP Readings from Last 3 Encounters:  02/03/22 125/88  01/27/22 120/74  12/03/21 130/78     Objective:  Physical Exam Vitals and nursing note reviewed.  Constitutional:      Appearance: Normal appearance.  HENT:     Head: Normocephalic and atraumatic.  Eyes:     Extraocular Movements: Extraocular movements intact.  Cardiovascular:     Rate and Rhythm: Normal rate and regular rhythm.     Heart sounds: Normal heart sounds.  Pulmonary:     Effort: Pulmonary effort is normal.     Breath sounds: Normal breath sounds.  Musculoskeletal:     Cervical back: Normal range of motion.  Skin:    General: Skin is warm.  Neurological:     General: No focal deficit present.     Mental Status: She is alert.  Psychiatric:        Mood and Affect: Mood normal.        Behavior: Behavior normal.      Assessment And Plan     1. Parenchymal renal hypertension, stage 1 through stage 4 or unspecified chronic kidney disease Comments: Chronic, well controlled. She is encouraged to follow low sodium diet. She will c/w losartan, hctz and metoprolol. She will f/u in 4-6 months.   2. Diabetes mellitus with stage 2 chronic kidney disease ([place]) Comments: Chronic, I will check labs as below. She is hesitant to take medication.   3. Pure hypercholesterolemia Comments: Chronic, she will c/w atorvastatin daily.  LDL goal <70.   4. Class 1 obesity due to excess calories with serious comorbidity and body mass index (BMI) of 32.0 to 32.9 in adult Comments: She is encourage to aim for at least 150 minutes of exercise per week, while striving for BMI<30 to decrease cardiac risk.    Patient was given opportunity to ask questions. Patient verbalized understanding  of the plan and was able to repeat key elements of the plan. All questions were answered to their satisfaction.   I, Martha Greenland, MD, have reviewed all documentation for this visit. The documentation on 01/27/22 for the exam, diagnosis, procedures, and orders are all accurate and complete.   IF YOU HAVE BEEN REFERRED TO A SPECIALIST, IT MAY TAKE 1-2 WEEKS TO SCHEDULE/PROCESS THE REFERRAL. IF YOU HAVE NOT HEARD FROM US/SPECIALIST IN TWO WEEKS, PLEASE GIVE [place] A CALL AT [phone].   THE PATIENT IS ENCOURAGED TO PRACTICE SOCIAL DISTANCING DUE TO THE COVID-19 PANDEMIC.

## 2022-01-27 NOTE — Patient Instructions (Signed)
Water Dehydration, Adult Dehydration is a condition in which there is not enough water or other fluids in the body. This happens when a person loses more fluids than he or she takes in. Important organs, such as the kidneys, brain, and heart, cannot function without a proper amount of fluids. Any loss of fluids from the body can lead to dehydration. Dehydration can be mild, moderate, or severe. It should be treated right away to prevent it from becoming severe. What are the causes? Dehydration may be caused by: Conditions that cause loss of water or other fluids, such as diarrhea, vomiting, or sweating or urinating a lot. Not drinking enough fluids, especially when you are ill or doing activities that require a lot of energy. Other illnesses and conditions, such as fever or infection. Certain medicines, such as medicines that remove excess fluid from the body (diuretics). Lack of safe drinking water. Not being able to get enough water and food. What increases the risk? The following factors may make you more likely to develop this condition: Having a long-term (chronic) illness that has not been treated properly, such as diabetes, heart disease, or kidney disease. Being 29 years of age or older. Having a disability. Living in a place that is high in altitude, where thinner, drier air causes more fluid loss. Doing exercises that put stress on your body for a long time (endurance sports). What are the signs or symptoms? Symptoms of dehydration depend on how severe it is. Mild or moderate dehydration Thirst. Dry lips or dry mouth. Dizziness or light-headedness, especially when standing up from a seated position. Muscle cramps. Dark urine. Urine may be the color of tea. Less urine or tears produced than usual. Headache. Severe dehydration Changes in skin. Your skin may be cold and clammy, blotchy, or pale. Your skin also may not return to normal after being lightly pinched and  released. Little or no tears, urine, or sweat. Changes in vital signs, such as rapid breathing and low blood pressure. Your pulse may be weak or may be faster than 100 beats a minute when you are sitting still. Other changes, such as: Feeling very thirsty. Sunken eyes. Cold hands and feet. Confusion. Being very tired (lethargic) or having trouble waking from sleep. Short-term weight loss. Loss of consciousness. How is this diagnosed? This condition is diagnosed based on your symptoms and a physical exam. You may have blood and urine tests to help confirm the diagnosis. How is this treated? Treatment for this condition depends on how severe it is. Treatment should be started right away. Do not wait until dehydration becomes severe. Severe dehydration is an emergency and needs to be treated in a hospital. Mild or moderate dehydration can be treated at home. You may be asked to: Drink more fluids. Drink an oral rehydration solution (ORS). This drink helps restore proper amounts of fluids and salts and minerals in the blood (electrolytes). Severe dehydration can be treated: With IV fluids. By correcting abnormal levels of electrolytes. This is often done by giving electrolytes through a tube that is passed through your nose and into your stomach (nasogastric tube, or NG tube). By treating the underlying cause of dehydration. Follow these instructions at home: Oral rehydration solution If told by your health care provider, drink an ORS: Make an ORS by following instructions on the package. Start by drinking small amounts, about  cup (120 mL) every 5-10 minutes. Slowly increase how much you drink until you have taken the amount recommended by your  health care provider. Eating and drinking        Drink enough clear fluid to keep your urine pale yellow. If you were told to drink an ORS, finish the ORS first and then start slowly drinking other clear fluids. Drink fluids such as: Water. Do  not drink only water. Doing that can lead to hyponatremia, which is having too little salt (sodium) in the body. Water from ice chips you suck on. Fruit juice that you have added water to (diluted fruit juice). Low-calorie sports drinks. Eat foods that contain a healthy balance of electrolytes, such as bananas, oranges, potatoes, tomatoes, and spinach. Do not drink alcohol. Avoid the following: Drinks that contain a lot of sugar. These include high-calorie sports drinks, fruit juice that is not diluted, and soda. Caffeine. Foods that are greasy or contain a lot of fat or sugar. General instructions Take over-the-counter and prescription medicines only as told by your health care provider. Do not take sodium tablets. Doing that can lead to having too much sodium in the body (hypernatremia). Return to your normal activities as told by your health care provider. Ask your health care provider what activities are safe for you. Keep all follow-up visits as told by your health care provider. This is important. Contact a health care provider if: You have muscle cramps, pain, or discomfort, such as: Pain in your abdomen and the pain gets worse or stays in one area (localizes). Stiff neck. You have a rash. You are more irritable than usual. You are sleepier or have a harder time waking than usual. You feel weak or dizzy. You feel very thirsty. Get help right away if you have: Any symptoms of severe dehydration. Symptoms of vomiting, such as: You cannot eat or drink without vomiting. Vomiting gets worse or does not go away. Vomit includes blood or green matter (bile). Symptoms that get worse with treatment. A fever. A severe headache. Problems with urination or bowel movements, such as: Diarrhea that gets worse or does not go away. Blood in your stool (feces). This may cause stool to look black and tarry. Not urinating, or urinating only a small amount of very dark urine, within 6-8  hours. Trouble breathing. These symptoms may represent a serious problem that is an emergency. Do not wait to see if the symptoms will go away. Get medical help right away. Call your local emergency services (911 in the [place] Do not drive yourself to the hospital. Summary Dehydration is a condition in which there is not enough water or other fluids in the body. This happens when a person loses more fluids than he or she takes in. Treatment for this condition depends on how severe it is. Treatment should be started right away. Do not wait until dehydration becomes severe. Drink enough clear fluid to keep your urine pale yellow. If you were told to drink an oral rehydration solution (ORS), finish the ORS first and then start slowly drinking other clear fluids. Take over-the-counter and prescription medicines only as told by your health care provider. Get help right away if you have any symptoms of severe dehydration. This information is not intended to replace advice given to you by your health care provider. Make sure you discuss any questions you have with your health care provider. Document Revised: 02/17/2019 Document Reviewed: 02/17/2019 Elsevier Patient Education  [place].

## 2022-01-29 ENCOUNTER — Ambulatory Visit: Payer: Medicare Other | Admitting: Internal Medicine

## 2022-02-03 ENCOUNTER — Telehealth (INDEPENDENT_AMBULATORY_CARE_PROVIDER_SITE_OTHER): Payer: Medicare Other | Admitting: Nurse Practitioner

## 2022-02-03 ENCOUNTER — Encounter: Payer: Self-pay | Admitting: Nurse Practitioner

## 2022-02-03 VITALS — BP 125/88 | HR 79 | Temp 97.8°F | Wt 164.0 lb

## 2022-02-03 DIAGNOSIS — U071 COVID-19: Secondary | ICD-10-CM | POA: Diagnosis not present

## 2022-02-03 MED ORDER — MOLNUPIRAVIR 200 MG PO CAPS: 4.0000 | ORAL_CAPSULE | Freq: Two times a day (BID) | ORAL | 0 refills | Status: AC

## 2022-02-03 NOTE — Patient Instructions (Signed)
COVID-19: Quarantine and Isolation °Quarantine °If you were exposed °Quarantine and stay away from others when you have been in close contact with someone who has COVID-19. °Isolate °If you are sick or test positive °Isolate when you are sick or when you have COVID-19, even if you don't have symptoms. °When to stay home °Calculating quarantine °The date of your exposure is considered day 0. Day 1 is the first full day after your last contact with a person who has had COVID-19. Stay home and away from other people for at least 5 days. Learn why CDC updated guidance for the general public. °IF YOU were exposed to COVID-19 and are NOT  °up to dateIF YOU were exposed to COVID-19 and are NOT on COVID-19 vaccinations °Quarantine for at least 5 days °Stay home °Stay home and quarantine for at least 5 full days. °Wear a well-fitting mask if you must be around others in your home. °Do not travel. °Get tested °Even if you don't develop symptoms, get tested at least 5 days after you last had close contact with someone with COVID-19. °After quarantine °Watch for symptoms °Watch for symptoms until 10 days after you last had close contact with someone with COVID-19. °Avoid travel °It is best to avoid travel until a full 10 days after you last had close contact with someone with COVID-19. °If you develop symptoms °Isolate immediately and get tested. Continue to stay home until you know the results. Wear a well-fitting mask around others. °Take precautions until day 10 °Wear a well-fitting mask °Wear a well-fitting mask for 10 full days any time you are around others inside your home or in public. Do not go to places where you are unable to wear a well-fitting mask. °If you must travel during days 6-10, take precautions. °Avoid being around people who are more likely to get very sick from COVID-19. °IF YOU were exposed to COVID-19 and are  °up to dateIF YOU were exposed to COVID-19 and are on COVID-19 vaccinations °No  quarantine °You do not need to stay home unless you develop symptoms. °Get tested °Even if you don't develop symptoms, get tested at least 5 days after you last had close contact with someone with COVID-19. °Watch for symptoms °Watch for symptoms until 10 days after you last had close contact with someone with COVID-19. °If you develop symptoms °Isolate immediately and get tested. Continue to stay home until you know the results. Wear a well-fitting mask around others. °Take precautions until day 10 °Wear a well-fitting mask °Wear a well-fitting mask for 10 full days any time you are around others inside your home or in public. Do not go to places where you are unable to wear a well-fitting mask. °Take precautions if traveling °Avoid being around people who are more likely to get very sick from COVID-19. °IF YOU were exposed to COVID-19 and had confirmed COVID-19 within the past 90 days (you tested positive using a viral test) °No quarantine °You do not need to stay home unless you develop symptoms. °Watch for symptoms °Watch for symptoms until 10 days after you last had close contact with someone with COVID-19. °If you develop symptoms °Isolate immediately and get tested. Continue to stay home until you know the results. Wear a well-fitting mask around others. °Take precautions until day 10 °Wear a well-fitting mask °Wear a well-fitting mask for 10 full days any time you are around others inside your home or in public. Do not go to places where you are   unable to wear a well-fitting mask. °Take precautions if traveling °Avoid being around people who are more likely to get very sick from COVID-19. °Calculating isolation °Day 0 is your first day of symptoms or a positive viral test. Day 1 is the first full day after your symptoms developed or your test specimen was collected. If you have COVID-19 or have symptoms, isolate for at least 5 days. °IF YOU tested positive for COVID-19 or have symptoms, regardless of  vaccination status °Stay home for at least 5 days °Stay home for 5 days and isolate from others in your home. °Wear a well-fitting mask if you must be around others in your home. °Do not travel. °Ending isolation if you had symptoms °End isolation after 5 full days if you are fever-free for 24 hours (without the use of fever-reducing medication) and your symptoms are improving. °Ending isolation if you did NOT have symptoms °End isolation after at least 5 full days after your positive test. °If you got very sick from COVID-19 or have a weakened immune system °You should isolate for at least 10 days. Consult your doctor before ending isolation. °Take precautions until day 10 °Wear a well-fitting mask °Wear a well-fitting mask for 10 full days any time you are around others inside your home or in public. Do not go to places where you are unable to wear a well-fitting mask. °Do not travel °Do not travel until a full 10 days after your symptoms started or the date your positive test was taken if you had no symptoms. °Avoid being around people who are more likely to get very sick from COVID-19. °Definitions °Exposure °Contact with someone infected with SARS-CoV-2, the virus that causes COVID-19, in a way that increases the likelihood of getting infected with the virus. °Close contact °A close contact is someone who was less than 6 feet away from an infected person (laboratory-confirmed or a clinical diagnosis) for a cumulative total of 15 minutes or more over a 24-hour period. For example, three individual 5-minute exposures for a total of 15 minutes. People who are exposed to someone with COVID-19 after they completed at least 5 days of isolation are not considered close contacts. °Quarantine °Quarantine is a strategy used to prevent transmission of COVID-19 by keeping people who have been in close contact with someone with COVID-19 apart from others. °Who does not need to quarantine? °If you had close contact with  someone with COVID-19 and you are in one of the following groups, you do not need to quarantine. °You are up to date with your COVID-19 vaccines. °You had confirmed COVID-19 within the last 90 days (meaning you tested positive using a viral test). °If you are up to date with COVID-19 vaccines, you should wear a well-fitting mask around others for 10 days from the date of your last close contact with someone with COVID-19 (the date of last close contact is considered day 0). Get tested at least 5 days after you last had close contact with someone with COVID-19. If you test positive or develop COVID-19 symptoms, isolate from other people and follow recommendations in the Isolation section below. If you tested positive for COVID-19 with a viral test within the previous 90 days and subsequently recovered and remain without COVID-19 symptoms, you do not need to quarantine or get tested after close contact. You should wear a well-fitting mask around others for 10 days from the date of your last close contact with someone with COVID-19 (the date of last   close contact is considered day 0). If you have COVID-19 symptoms, get tested and isolate from other people and follow recommendations in the Isolation section below. °Who should quarantine? °If you come into close contact with someone with COVID-19, you should quarantine if you are not up to date on COVID-19 vaccines. This includes people who are not vaccinated. °What to do for quarantine °Stay home and away from other people for at least 5 days (day 0 through day 5) after your last contact with a person who has COVID-19. The date of your exposure is considered day 0. Wear a well-fitting mask when around others at home, if possible. °For 10 days after your last close contact with someone with COVID-19, watch for fever (100.4°F or greater), cough, shortness of breath, or other COVID-19 symptoms. °If you develop symptoms, get tested immediately and isolate until you receive  your test results. If you test positive, follow isolation recommendations. °If you do not develop symptoms, get tested at least 5 days after you last had close contact with someone with COVID-19. °If you test negative, you can leave your home, but continue to wear a well-fitting mask when around others at home and in public until 10 days after your last close contact with someone with COVID-19. °If you test positive, you should isolate for at least 5 days from the date of your positive test (if you do not have symptoms). If you do develop COVID-19 symptoms, isolate for at least 5 days from the date your symptoms began (the date the symptoms started is day 0). Follow recommendations in the isolation section below. °If you are unable to get a test 5 days after last close contact with someone with COVID-19, you can leave your home after day 5 if you have been without COVID-19 symptoms throughout the 5-day period. Wear a well-fitting mask for 10 days after your date of last close contact when around others at home and in public. °Avoid people who are have weakened immune systems or are more likely to get very sick from COVID-19, and nursing homes and other high-risk settings, until after at least 10 days. °If possible, stay away from people you live with, especially people who are at higher risk for getting very sick from COVID-19, as well as others outside your home throughout the full 10 days after your last close contact with someone with COVID-19. °If you are unable to quarantine, you should wear a well-fitting mask for 10 days when around others at home and in public. °If you are unable to wear a mask when around others, you should continue to quarantine for 10 days. Avoid people who have weakened immune systems or are more likely to get very sick from COVID-19, and nursing homes and other high-risk settings, until after at least 10 days. °See additional information about travel. °Do not go to places where you are  unable to wear a mask, such as restaurants and some gyms, and avoid eating around others at home and at work until after 10 days after your last close contact with someone with COVID-19. °After quarantine °Watch for symptoms until 10 days after your last close contact with someone with COVID-19. °If you have symptoms, isolate immediately and get tested. °Quarantine in high-risk congregate settings °In certain congregate settings that have high risk of secondary transmission (such as correctional and detention facilities, homeless shelters, or cruise ships), CDC recommends a 10-day quarantine for residents, regardless of vaccination and booster status. During periods of critical staffing   shortages, facilities may consider shortening the quarantine period for staff to ensure continuity of operations. Decisions to shorten quarantine in these settings should be made in consultation with state, local, tribal, or territorial health departments and should take into consideration the context and characteristics of the facility. CDC's setting-specific guidance provides additional recommendations for these settings. °Isolation °Isolation is used to separate people with confirmed or suspected COVID-19 from those without COVID-19. People who are in isolation should stay home until it's safe for them to be around others. At home, anyone sick or infected should separate from others, or wear a well-fitting mask when they need to be around others. People in isolation should stay in a specific "sick room" or area and use a separate bathroom if available. Everyone who has presumed or confirmed COVID-19 should stay home and isolate from other people for at least 5 full days (day 0 is the first day of symptoms or the date of the day of the positive viral test for asymptomatic persons). They should wear a mask when around others at home and in public for an additional 5 days. People who are confirmed to have COVID-19 or are showing  symptoms of COVID-19 need to isolate regardless of their vaccination status. This includes: °People who have a positive viral test for COVID-19, regardless of whether or not they have symptoms. °People with symptoms of COVID-19, including people who are awaiting test results or have not been tested. People with symptoms should isolate even if they do not know if they have been in close contact with someone with COVID-19. °What to do for isolation °Monitor your symptoms. If you have an emergency warning sign (including trouble breathing), seek emergency medical care immediately. °Stay in a separate room from other household members, if possible. °Use a separate bathroom, if possible. °Take steps to improve ventilation at home, if possible. °Avoid contact with other members of the household and pets. °Don't share personal household items, like cups, towels, and utensils. °Wear a well-fitting mask when you need to be around other people. °Learn more about what to do if you are sick and how to notify your contacts. °Ending isolation for people who had COVID-19 and had symptoms °If you had COVID-19 and had symptoms, isolate for at least 5 days. To calculate your 5-day isolation period, day 0 is your first day of symptoms. Day 1 is the first full day after your symptoms developed. You can leave isolation after 5 full days. °You can end isolation after 5 full days if you are fever-free for 24 hours without the use of fever-reducing medication and your other symptoms have improved (Loss of taste and smell may persist for weeks or months after recovery and need not delay the end of isolation). °You should continue to wear a well-fitting mask around others at home and in public for 5 additional days (day 6 through day 10) after the end of your 5-day isolation period. If you are unable to wear a mask when around others, you should continue to isolate for a full 10 days. Avoid people who have weakened immune systems or are more  likely to get very sick from COVID-19, and nursing homes and other high-risk settings, until after at least 10 days. °If you continue to have fever or your other symptoms have not improved after 5 days of isolation, you should wait to end your isolation until you are fever-free for 24 hours without the use of fever-reducing medication and your other symptoms have improved.   Continue to wear a well-fitting mask through day 10. Contact your healthcare provider if you have questions. °See additional information about travel. °Do not go to places where you are unable to wear a mask, such as restaurants and some gyms, and avoid eating around others at home and at work until a full 10 days after your first day of symptoms. °If an individual has access to a test and wants to test, the best approach is to use an antigen test1 towards the end of the 5-day isolation period. Collect the test sample only if you are fever-free for 24 hours without the use of fever-reducing medication and your other symptoms have improved (loss of taste and smell may persist for weeks or months after recovery and need not delay the end of isolation). If your test result is positive, you should continue to isolate until day 10. If your test result is negative, you can end isolation, but continue to wear a well-fitting mask around others at home and in public until day 10. Follow additional recommendations for masking and avoiding travel as described above. °1As noted in the labeling for authorized over-the counter antigen tests: Negative results should be treated as presumptive. Negative results do not rule out SARS-CoV-2 infection and should not be used as the sole basis for treatment or patient management decisions, including infection control decisions. To improve results, antigen tests should be used twice over a three-day period with at least 24 hours and no more than 48 hours between tests. °Note that these recommendations on ending isolation  do not apply to people who are moderately ill or very sick from COVID-19 or have weakened immune systems. See section below for recommendations for when to end isolation for these groups. °Ending isolation for people who tested positive for COVID-19 but had no symptoms °If you test positive for COVID-19 and never develop symptoms, isolate for at least 5 days. Day 0 is the day of your positive viral test (based on the date you were tested) and day 1 is the first full day after the specimen was collected for your positive test. You can leave isolation after 5 full days. °If you continue to have no symptoms, you can end isolation after at least 5 days. °You should continue to wear a well-fitting mask around others at home and in public until day 10 (day 6 through day 10). If you are unable to wear a mask when around others, you should continue to isolate for 10 days. Avoid people who have weakened immune systems or are more likely to get very sick from COVID-19, and nursing homes and other high-risk settings, until after at least 10 days. °If you develop symptoms after testing positive, your 5-day isolation period should start over. Day 0 is your first day of symptoms. Follow the recommendations above for ending isolation for people who had COVID-19 and had symptoms. °See additional information about travel. °Do not go to places where you are unable to wear a mask, such as restaurants and some gyms, and avoid eating around others at home and at work until 10 days after the day of your positive test. °If an individual has access to a test and wants to test, the best approach is to use an antigen test1 towards the end of the 5-day isolation period. If your test result is positive, you should continue to isolate until day 10. If your test result is positive, you can also choose to test daily and if your test result   is negative, you can end isolation, but continue to wear a well-fitting mask around others at home and in  public until day 10. Follow additional recommendations for masking and avoiding travel as described above. °1As noted in the labeling for authorized over-the counter antigen tests: Negative results should be treated as presumptive. Negative results do not rule out SARS-CoV-2 infection and should not be used as the sole basis for treatment or patient management decisions, including infection control decisions. To improve results, antigen tests should be used twice over a three-day period with at least 24 hours and no more than 48 hours between tests. °Ending isolation for people who were moderately or very sick from COVID-19 or have a weakened immune system °People who are moderately ill from COVID-19 (experiencing symptoms that affect the lungs like shortness of breath or difficulty breathing) should isolate for 10 days and follow all other isolation precautions. To calculate your 10-day isolation period, day 0 is your first day of symptoms. Day 1 is the first full day after your symptoms developed. If you are unsure if your symptoms are moderate, talk to a healthcare provider for further guidance. °People who are very sick from COVID-19 (this means people who were hospitalized or required intensive care or ventilation support) and people who have weakened immune systems might need to isolate at home longer. They may also require testing with a viral test to determine when they can be around others. CDC recommends an isolation period of at least 10 and up to 20 days for people who were very sick from COVID-19 and for people with weakened immune systems. Consult with your healthcare provider about when you can resume being around other people. If you are unsure if your symptoms are severe or if you have a weakened immune system, talk to a healthcare provider for further guidance. °People who have a weakened immune system should talk to their healthcare provider about the potential for reduced immune responses to  COVID-19 vaccines and the need to continue to follow current prevention measures (including wearing a well-fitting mask and avoiding crowds and poorly ventilated indoor spaces) to protect themselves against COVID-19 until advised otherwise by their healthcare provider. Close contacts of immunocompromised people--including household members--should also be encouraged to receive all recommended COVID-19 vaccine doses to help protect these people. °Isolation in high-risk congregate settings °In certain high-risk congregate settings that have high risk of secondary transmission and where it is not feasible to cohort people (such as correctional and detention facilities, homeless shelters, and cruise ships), CDC recommends a 10-day isolation period for residents. During periods of critical staffing shortages, facilities may consider shortening the isolation period for staff to ensure continuity of operations. Decisions to shorten isolation in these settings should be made in consultation with state, local, tribal, or territorial health departments and should take into consideration the context and characteristics of the facility. CDC's setting-specific guidance provides additional recommendations for these settings. °This CDC guidance is meant to supplement--not replace--any federal, state, local, territorial, or tribal health and safety laws, rules, and regulations. °Recommendations for specific settings °These recommendations do not apply to healthcare professionals. For guidance specific to these settings, see °Healthcare professionals: Interim Guidance for Managing Healthcare Personnel with SARS-CoV-2 Infection or Exposure to SARS-CoV-2 °Patients, residents, and visitors to healthcare settings: Interim Infection Prevention and Control Recommendations for Healthcare Personnel During the Coronavirus Disease 2019 (COVID-19) Pandemic °Additional setting-specific guidance and recommendations are available. °These  recommendations on quarantine and isolation do apply to [employer] School   settings. Additional guidance is available here: Overview of COVID-19 Quarantine for K-12 Schools °Travelers: Travel information and recommendations °Congregate facilities and other settings: guidance pages for community, work, and school settings °Ongoing COVID-19 exposure FAQs °I live with someone with COVID-19, but I cannot be separated from them. How do we manage quarantine in this situation? °It is very important for people with COVID-19 to remain apart from other people, if possible, even if they are living together. If separation of the person with COVID-19 from others that they live with is not possible, the other people that they live with will have ongoing exposure, meaning they will be repeatedly exposed until that person is no longer able to spread the virus to other people. In this situation, there are precautions you can take to limit the spread of COVID-19: °The person with COVID-19 and everyone they live with should wear a well-fitting mask inside the home. °If possible, one person should care for the person with COVID-19 to limit the number of people who are in close contact with the infected person. °Take steps to protect yourself and others to reduce transmission in the home: °Quarantine if you are not up to date with your COVID-19 vaccines. °Isolate if you are sick or tested positive for COVID-19, even if you don't have symptoms. °Learn more about the public health recommendations for testing, mask use and quarantine of close contacts, like yourself, who have ongoing exposure. These recommendations differ depending on your vaccination status. °What should I do if I have ongoing exposure to COVID-19 from someone I live with? °Recommendations for this situation depend on your vaccination status: °If you are not up to date on COVID-19 vaccines and have ongoing exposure to COVID-19, you should: °Begin quarantine immediately and  continue to quarantine throughout the isolation period of the person with COVID-19. °Continue to quarantine for an additional 5 days starting the day after the end of isolation for the person with COVID-19. °Get tested at least 5 days after the end of isolation of the infected person that lives with them. °If you test negative, you can leave the home but should continue to wear a well-fitting mask when around others at home and in public until 10 days after the end of isolation for the person with COVID-19. °Isolate immediately if you develop symptoms of COVID-19 or test positive. °If you are up to date with COVID-19 vaccines and have ongoing exposure to COVID-19, you should: °Get tested at least 5 days after your first exposure. A person with COVID-19 is considered infectious starting 2 days before they develop symptoms, or 2 days before the date of their positive test if they do not have symptoms. °Get tested again at least 5 days after the end of isolation for the person with COVID-19. °Wear a well-fitting mask when you are around the person with COVID-19, and do this throughout their isolation period. °Wear a well-fitting mask around others for 10 days after the infected person's isolation period ends. °Isolate immediately if you develop symptoms of COVID-19 or test positive. °What should I do if multiple people I live with test positive for COVID-19 at different times? °Recommendations for this situation depend on your vaccination status: °If you are not up to date with your COVID-19 vaccines, you should: °Quarantine throughout the isolation period of any infected person that you live with. °Continue to quarantine until 5 days after the end of isolation date for the most recently infected person that lives with you. For example, if   the last day of isolation of the person most recently infected with COVID-19 was June 30, the new 5-day quarantine period starts on July 1. °Get tested at least 5 days after the end  of isolation for the most recently infected person that lives with you. °Wear a well-fitting mask when you are around any person with COVID-19 while that person is in isolation. °Wear a well-fitting mask when you are around other people until 10 days after your last close contact. °Isolate immediately if you develop symptoms of COVID-19 or test positive. °If you are up to date with your COVID-19 vaccines, you should: °Get tested at least 5 days after your first exposure. A person with COVID-19 is considered infectious starting 2 days before they developed symptoms, or 2 days before the date of their positive test if they do not have symptoms. °Get tested again at least 5 days after the end of isolation for the most recently infected person that lives with you. °Wear a well-fitting mask when you are around any person with COVID-19 while that person is in isolation. °Wear a well-fitting mask around others for 10 days after the end of isolation for the most recently infected person that lives with you. For example, if the last day of isolation for the person most recently infected with COVID-19 was June 30, the new 10-day period to wear a well-fitting mask indoors in public starts on July 1. °Isolate immediately if you develop symptoms of COVID-19 or test positive. °I had COVID-19 and completed isolation. Do I have to quarantine or get tested if someone I live with gets COVID-19 shortly after I completed isolation? °No. If you recently completed isolation and someone that lives with you tests positive for the virus that causes COVID-19 shortly after the end of your isolation period, you do not have to quarantine or get tested as long as you do not develop new symptoms. Once all of the people that live together have completed isolation or quarantine, refer to the guidance below for new exposures to COVID-19. °If you had COVID-19 in the previous 90 days and then came into close contact with someone with COVID-19, you do  not have to quarantine or get tested if you do not have symptoms. But you should: °Wear a well-fitting mask indoors in public for 10 days after your last close contact. °Monitor for COVID-19 symptoms for 10 days from the date of your last close contact. °Isolate immediately and get tested if symptoms develop. °If more than 90 days have passed since your recovery from infection, follow CDC's recommendations for close contacts. These recommendations will differ depending on your vaccination status. °10/17/2020 °Content source: National Center for Immunization and Respiratory Diseases (NCIRD), Division of Viral Diseases °This information is not intended to replace advice given to you by your health care provider. Make sure you discuss any questions you have with your health care provider. °Document Revised: 02/20/2021 Document Reviewed: 02/20/2021 °Elsevier Patient Education © 2022 Elsevier Inc. ° °

## 2022-02-03 NOTE — Progress Notes (Signed)
Virtual Visit via MyChart   This visit type was conducted due to national recommendations for restrictions regarding the COVID-19 Pandemic (e.g. social distancing) in an effort to limit this patient's exposure and mitigate transmission in our community.  Due to her co-morbid illnesses, this patient is at least at moderate risk for complications without adequate follow up.  This format is felt to be most appropriate for this patient at this time.  All issues noted in this document were discussed and addressed.  A limited physical exam was performed with this format.    This visit type was conducted due to national recommendations for restrictions regarding the COVID-19 Pandemic (e.g. social distancing) in an effort to limit this patient's exposure and mitigate transmission in our community.  Patients identity confirmed using two different identifiers.  This format is felt to be most appropriate for this patient at this time.  All issues noted in this document were discussed and addressed.  No physical exam was performed (except for noted visual exam findings with Video Visits).    Date:  02/03/2022   ID:  Martha Pham, DOB 07-11-1951, MRN [id number]  Patient Location:  Home - spoke to [employer]  Provider location:   Office    Chief Complaint:  positive covid   History of Present Illness:    Martha Pham is a 71 y.o. female who presents via video conferencing for a telehealth visit today.    The patient does have symptoms concerning for COVID-19 infection (fever, chills, cough, or new shortness of breath).   She works at [employer] part time and this morning her co worker sent a message that she had tested positive for covid. She was feeling bad herself last week. She had been sleeping under a ceiling fan. She had a headache, no fever. Symptoms initially started on Saturday morning, she remained at home all day Saturday and Sunday.   She has had a decreased appetite due to taking  Ozempic. She had been drinking a lot of liquids.     Past Medical History:  Diagnosis Date   Apnea, sleep    does wear cpap   Arthritis    Cancer ([place])    lt. breast ca-snbx   Chronic kidney disease    Contact lens/glasses fitting    wears contacts or glasses   COPD (chronic obstructive pulmonary disease) ([place])    brother   Heart murmur    Hypercholesteremia    Hyperlipemia    Hypertension    Lower back pain    Lung cancer ([place])    Malaise and fatigue    Personal history of chemotherapy 2013   Sleep apnea    SLEEP STUDY DX 10,CPAP BUT DON'TUSE   Vitamin D deficiency    Wears dentures    upper   Past Surgical History:  Procedure Laterality Date   BREAST BIOPSY Left 05/26/2011   malignant   BREAST BIOPSY Right 10/29/2016   BREAST IMPLANT EXCHANGE Left 09/23/2012   Procedure: REMOVAL OF LEFT EXPANDER/PLACEMENT OF IMPLANT WITH REDUCTION OF RIGHT BREAST;  Surgeon: Theodoro Kos, DO;  Location: [facility];  Service: Plastics;  Laterality: Left;   BREAST LUMPECTOMY W/ NEEDLE LOCALIZATION  06/24/2011   left   BREAST REDUCTION SURGERY Right 09/23/2012   Procedure: MAMMARY REDUCTION  (BREAST);  Surgeon: Theodoro Kos, DO;  Location: [facility];  Service: Plastics;  Laterality: Right;   BREAST SURGERY  08/07/2011   re-exc. left breast margins  CATARACT EXTRACTION Bilateral    january and february 2022   CYST REMOVAL NECK  08/2013   Dr. Migdalia Dk   LIPOSUCTION Bilateral 09/23/2012   Procedure: LIPOSUCTION;  Surgeon: Theodoro Kos, DO;  Location: [facility];  Service: Plastics;  Laterality: Bilateral;   MASTECTOMY Left 2012   MOUTH SURGERY     TOOTH IMPLANT BOTTOM X2 LFT   PORT-A-CATH REMOVAL Right 09/23/2012   Procedure: REMOVAL PORT-A-CATH;  Surgeon: Theodoro Kos, DO;  Location: [facility];  Service: Plastics;  Laterality: Right;   PORTACATH PLACEMENT  06/24/2011   Procedure: INSERTION PORT-A-CATH;  Surgeon: Judieth Keens, DO;  Location: [facility];  Service: General;  Laterality: Right;  Right mediport placement   RECONSTRUCTION BREAST IMMEDIATE / DELAYED W/ TISSUE EXPANDER  04/2012   left   REDUCTION MAMMAPLASTY Right 2013   SIMPLE MASTECTOMY  09/01/2011   left   TONSILLECTOMY       No outpatient medications have been marked as taking for the 02/03/22 encounter (Video Visit) with Minette Brine, FNP.     Allergies:   Dust mite extract and Other   Social History   Tobacco Use   Smoking status: Never   Smokeless tobacco: Never  Vaping Use   Vaping Use: Never used  Substance Use Topics   Alcohol use: Not Currently    Alcohol/week: 2.0 standard drinks of alcohol    Types: 2 Glasses of wine per week    Comment: 8 oz occasionally per week   Drug use: No     Family Hx: The patient's family history includes Brain cancer in her father; Breast cancer (age of onset: 53) in her sister; Breast cancer (age of onset: 39) in her sister; Heart attack in her mother. There is no history of Sleep apnea.  ROS:   Please see the history of present illness.    Review of Systems  Constitutional:  Positive for chills. Negative for fever and malaise/fatigue.  HENT:  Positive for congestion.   Respiratory:  Positive for cough (dry cough). Negative for sputum production, shortness of breath and wheezing.   Gastrointestinal:  Positive for diarrhea.  Neurological:  Positive for headaches. Negative for dizziness.  Psychiatric/Behavioral:  Negative for depression.     All other systems reviewed and are negative.   Labs/Other Tests and Data Reviewed:    Recent Labs: 06/06/2021: Hemoglobin 11.8; Platelets 200; TSH 1.090 11/28/2021: ALT 15; BUN 16; Creatinine, Ser 1.07; Potassium 3.8; Sodium 142   Recent Lipid Panel Lab Results  Component Value Date/Time   CHOL 180 11/28/2021 10:01 AM   TRIG 94 11/28/2021 10:01 AM   HDL 51 11/28/2021 10:01 AM   CHOLHDL 3.5 11/28/2021 10:01 AM   LDLCALC  112 (H) 11/28/2021 10:01 AM    Wt Readings from Last 3 Encounters:  01/27/22 164 lb 6.4 oz (74.6 kg)  12/03/21 171 lb (77.6 kg)  11/29/21 171 lb 1.6 oz (77.6 kg)     Exam:    Vital Signs:  There were no vitals taken for this visit.    Physical Exam Vitals reviewed.  Constitutional:      General: She is not in acute distress.    Appearance: Normal appearance.  Cardiovascular:     Rate and Rhythm: Normal rate and regular rhythm.     Pulses: Normal pulses.     Heart sounds: Normal heart sounds. No murmur heard. Pulmonary:     Effort: Pulmonary effort is normal. No respiratory distress.  Breath sounds: Normal breath sounds.  Neurological:     General: No focal deficit present.     Mental Status: She is alert and oriented to person, place, and time. Mental status is at baseline.     Cranial Nerves: No cranial nerve deficit.     Motor: No weakness.  Psychiatric:        Mood and Affect: Mood and affect normal.        Behavior: Behavior normal.        Thought Content: Thought content normal.        Cognition and Memory: Memory normal.        Judgment: Judgment normal.    ASSESSMENT & PLAN:    1. Positive self-administered antigen test for COVID-19 Advised patient to take Vitamin C, D, Zinc.  Keep yourself hydrated with a lot of water and rest. Take Delsym for cough and Mucinex as need. Take Tylenol or pain reliever every 4-6 hours as needed for pain/fever/body ache. If you have elevated blood pressure, you can take OTC Corcidin. You can also take OTC oscillococcinum to help with your symptoms.  Educated patient if symptoms get worse or if she experiences any SOB, chest pain or pain in her legs to seek immediate emergency care. Continue to monitor your oxygen levels. Call us if you have any questions. Quarantine for 5 days if tested positive and no symptoms or 10 days if tested positive and have symptoms. Wear a mask around other people.  Encouraged to take  - molnupiravir EUA  (LAGEVRIO) 200 MG CAPS capsule; Take 4 capsules (800 mg total) by mouth 2 (two) times daily for 5 days.  Dispense: 40 capsule; Refill: 0   COVID-19 Education: The signs and symptoms of COVID-19 were discussed with the patient and how to seek care for testing (follow up with PCP or arrange E-visit).  The importance of social distancing was discussed today.  Patient Risk:   After full review of this patients clinical status, I feel that they are at least moderate risk at this time.  Time:   Today, I have spent 14.15 minutes/ seconds with the patient with telehealth technology discussing above diagnoses.     Medication Adjustments/Labs and Tests Ordered: Current medicines are reviewed at length with the patient today.  Concerns regarding medicines are outlined above.   Tests Ordered: No orders of the defined types were placed in this encounter.   Medication Changes: No orders of the defined types were placed in this encounter.   Disposition:  Follow up prn  Signed, Sheppard Evens [place], CMA

## 2022-03-12 ENCOUNTER — Other Ambulatory Visit: Payer: Self-pay | Admitting: Internal Medicine

## 2022-03-17 ENCOUNTER — Telehealth: Payer: Self-pay | Admitting: Internal Medicine

## 2022-03-17 NOTE — Telephone Encounter (Signed)
Left message for patient to call back and schedule Medicare Annual Wellness Visit (AWV) either virtually or in office.  Left both my jabber number [phone] and office number    Last AWV ;06/19/21 please schedule at anytime with [facility]    .

## 2022-03-21 ENCOUNTER — Telehealth: Payer: Self-pay

## 2022-03-21 NOTE — Chronic Care Management (AMB) (Signed)
03-21-2022: rescheduled 04-01-2022 appointment with [name] to 05-14-2022 due to CPP meeting.  [facility] Pharmacist Assistant [phone]

## 2022-04-01 ENCOUNTER — Ambulatory Visit: Payer: Medicare Other | Admitting: Internal Medicine

## 2022-04-01 ENCOUNTER — Telehealth: Payer: PPO

## 2022-04-03 ENCOUNTER — Encounter: Payer: Self-pay | Admitting: Internal Medicine

## 2022-04-03 ENCOUNTER — Ambulatory Visit (INDEPENDENT_AMBULATORY_CARE_PROVIDER_SITE_OTHER): Payer: Medicare Other | Admitting: Internal Medicine

## 2022-04-03 VITALS — BP 128/66 | HR 71 | Temp 98.3°F | Ht 60.0 in | Wt 156.4 lb

## 2022-04-03 DIAGNOSIS — Z23 Encounter for immunization: Secondary | ICD-10-CM

## 2022-04-03 DIAGNOSIS — E7800 Pure hypercholesterolemia, unspecified: Secondary | ICD-10-CM

## 2022-04-03 DIAGNOSIS — N182 Chronic kidney disease, stage 2 (mild): Secondary | ICD-10-CM | POA: Diagnosis not present

## 2022-04-03 DIAGNOSIS — I129 Hypertensive chronic kidney disease with stage 1 through stage 4 chronic kidney disease, or unspecified chronic kidney disease: Secondary | ICD-10-CM | POA: Diagnosis not present

## 2022-04-03 DIAGNOSIS — E1122 Type 2 diabetes mellitus with diabetic chronic kidney disease: Secondary | ICD-10-CM | POA: Diagnosis not present

## 2022-04-03 DIAGNOSIS — R002 Palpitations: Secondary | ICD-10-CM | POA: Diagnosis not present

## 2022-04-03 DIAGNOSIS — Z8616 Personal history of COVID-19: Secondary | ICD-10-CM

## 2022-04-03 NOTE — Progress Notes (Unsigned)
Martha Pham,acting as a Education administrator for Martha Greenland, MD.,have documented all relevant documentation on the behalf of Martha Greenland, MD,as directed by  Martha Greenland, MD while in the presence of Martha Greenland, MD.    Subjective:     Patient ID: Martha Pham , female    DOB: [birth year] , 71 y.o.   MRN: [id number]   Chief Complaint  Patient presents with   Hypertension    HPI  Patient present for a bp check. She reports compliance with meds. She denies headaches, chest pain and shortness of breath. She does report having palpitations, she reports her sx started after having Covid 2 months ago.   BP Readings from Last 3 Encounters: 04/03/22 : 128/66 02/03/22 : 125/88 01/27/22 : 120/74    Hypertension     Past Medical History:  Diagnosis Date   Apnea, sleep    does wear cpap   Arthritis    Cancer ([place])    lt. breast ca-snbx   Chronic kidney disease    Contact lens/glasses fitting    wears contacts or glasses   COPD (chronic obstructive pulmonary disease) ([place])    brother   Heart murmur    Hypercholesteremia    Hyperlipemia    Hypertension    Lower back pain    Lung cancer ([place])    Malaise and fatigue    Personal history of chemotherapy 2013   Sleep apnea    SLEEP STUDY DX 10,CPAP BUT DON'TUSE   Vitamin D deficiency    Wears dentures    upper     Family History  Problem Relation Age of Onset   Heart attack Mother    Brain cancer Father    Breast cancer Sister 75   Breast cancer Sister 39   Sleep apnea Neg Hx      Current Outpatient Medications:    acetaminophen (TYLENOL) 500 MG tablet, Take 1,000 mg by mouth every 6 (six) hours as needed for moderate pain. , Disp: , Rfl:    Ascorbic Acid (VITAMIN C) 1000 MG tablet, Take 1,000 mg by mouth daily., Disp: , Rfl:    atorvastatin (LIPITOR) 40 MG tablet, Take 1 tablet (40 mg total) by mouth daily., Disp: 90 tablet, Rfl: 3   calcium carbonate (OS-CAL) 600 MG TABS, Take 600 mg by mouth 2 (two) times  daily with a meal.  , Disp: , Rfl:    Carboxymethylcellulose Sodium (REFRESH OP), Apply 1 drop to eye 2 (two) times daily as needed. For dry eyes, Disp: , Rfl:    Cyanocobalamin (VITAMIN B-12) 2500 MCG SUBL, Place 1 tablet under the tongue daily., Disp: , Rfl:    gabapentin (NEURONTIN) 100 MG capsule, Take 1 pill at bedtime as needed for leg pain., Disp: 90 capsule, Rfl: 3   glucose blood (CONTOUR NEXT TEST) test strip, USE AS DIRECTED TO  CHECK  BLOOD  SUGARS  1  TIME  PER  DAY, Disp: 100 each, Rfl: 0   hydrochlorothiazide (HYDRODIURIL) 25 MG tablet, Take 1 tablet (25 mg total) by mouth daily., Disp: 90 tablet, Rfl: 1   loratadine (CLARITIN) 10 MG tablet, Take 1 tablet (10 mg total) by mouth daily., Disp: 30 tablet, Rfl: 2   losartan (COZAAR) 100 MG tablet, Take 1 tablet (100 mg total) by mouth daily., Disp: 90 tablet, Rfl: 1   Magnesium 400 MG CAPS, Take by mouth daily. , Disp: , Rfl:    metoprolol succinate (TOPROL-XL) 25 MG 24 hr  tablet, Take 1 tablet by mouth once daily, Disp: 90 tablet, Rfl: 0   Microlet Lancets MISC, USE AS DIRECTED TO  CHECK  BLOOD  SUGARS  1  TIME  PER  DAY, Disp: 100 each, Rfl: 0   Semaglutide,0.25 or 0.5MG/DOS, (OZEMPIC, 0.25 OR 0.5 MG/DOSE,) 2 MG/3ML SOPN, Inject 0.5 mg into the skin once a week., Disp: 9 mL, Rfl: 1   Vitamin D, Cholecalciferol, 50 MCG (2000 UT) CAPS, Take by mouth. , Disp: , Rfl:    Allergies  Allergen Reactions   Dust Mite Extract Itching   Other     grass     Review of Systems   Today's Vitals   04/03/22 1140  BP: 128/66  Pulse: 71  Temp: 98.3 F (36.8 C)  TempSrc: Oral  Weight: 156 lb 6.4 oz (70.9 kg)  Height: 5' (1.524 m)  PainSc: 0-No pain   Body mass index is 30.54 kg/m.  Wt Readings from Last 3 Encounters:  04/03/22 156 lb 6.4 oz (70.9 kg)  02/03/22 164 lb (74.4 kg)  01/27/22 164 lb 6.4 oz (74.6 kg)     Objective:  Physical Exam Vitals and nursing note reviewed.  Constitutional:      Appearance: Normal appearance.   HENT:     Head: Normocephalic and atraumatic.  Eyes:     Extraocular Movements: Extraocular movements intact.  Cardiovascular:     Rate and Rhythm: Normal rate and regular rhythm.     Heart sounds: Normal heart sounds.  Pulmonary:     Effort: Pulmonary effort is normal.     Breath sounds: Normal breath sounds.  Musculoskeletal:     Cervical back: Normal range of motion.  Skin:    General: Skin is warm.  Neurological:     General: No focal deficit present.     Mental Status: She is alert.  Psychiatric:        Mood and Affect: Mood normal.        Behavior: Behavior normal.      Assessment And Plan:     1. Diabetes mellitus with stage 2 chronic kidney disease (HCC) - BMP8+eGFR - Hemoglobin A1c  2. Parenchymal renal hypertension, stage 1 through stage 4 or unspecified chronic kidney disease  3. Pure hypercholesterolemia  4. Palpitations Comments: EKG Performed, SB w/o acute changes. I will check labs as below. She does admit to not drinking enough fluids; however, would prefer Cardiac eval.  - EKG 12-Lead - TSH - Magnesium - Ambulatory referral to Cardiology  5. Personal history of COVID-19 Comments: She had COVID July 2023.    Patient was given opportunity to ask questions. Patient verbalized understanding of the plan and was able to repeat key elements of the plan. All questions were answered to their satisfaction.   I, Martha Greenland, MD, have reviewed all documentation for this visit. The documentation on 04/03/22 for the exam, diagnosis, procedures, and orders are all accurate and complete.   IF YOU HAVE BEEN REFERRED TO A SPECIALIST, IT MAY TAKE 1-2 WEEKS TO SCHEDULE/PROCESS THE REFERRAL. IF YOU HAVE NOT HEARD FROM US/SPECIALIST IN TWO WEEKS, PLEASE GIVE [place] A CALL AT [phone] X [phone].   THE PATIENT IS ENCOURAGED TO PRACTICE SOCIAL DISTANCING DUE TO THE COVID-19 PANDEMIC.

## 2022-04-03 NOTE — Patient Instructions (Signed)
Hypertension, Adult ?Hypertension is another name for high blood pressure. High blood pressure forces your heart to work harder to pump blood. This can cause problems over time. ?There are two numbers in a blood pressure reading. There is a top number (systolic) over a bottom number (diastolic). It is best to have a blood pressure that is below 120/80. ?What are the causes? ?The cause of this condition is not known. Some other conditions can lead to high blood pressure. ?What increases the risk? ?Some lifestyle factors can make you more likely to develop high blood pressure: ?Smoking. ?Not getting enough exercise or physical activity. ?Being overweight. ?Having too much fat, sugar, calories, or salt (sodium) in your diet. ?Drinking too much alcohol. ?Other risk factors include: ?Having any of these conditions: ?Heart disease. ?Diabetes. ?High cholesterol. ?Kidney disease. ?Obstructive sleep apnea. ?Having a family history of high blood pressure and high cholesterol. ?Age. The risk increases with age. ?Stress. ?What are the signs or symptoms? ?High blood pressure may not cause symptoms. Very high blood pressure (hypertensive crisis) may cause: ?Headache. ?Fast or uneven heartbeats (palpitations). ?Shortness of breath. ?Nosebleed. ?Vomiting or feeling like you may vomit (nauseous). ?Changes in how you see. ?Very bad chest pain. ?Feeling dizzy. ?Seizures. ?How is this treated? ?This condition is treated by making healthy lifestyle changes, such as: ?Eating healthy foods. ?Exercising more. ?Drinking less alcohol. ?Your doctor may prescribe medicine if lifestyle changes do not help enough and if: ?Your top number is above 130. ?Your bottom number is above 80. ?Your personal target blood pressure may vary. ?Follow these instructions at home: ?Eating and drinking ? ?If told, follow the DASH eating plan. To follow this plan: ?Fill one half of your plate at each meal with fruits and vegetables. ?Fill one fourth of your plate  at each meal with whole grains. Whole grains include whole-wheat pasta, brown rice, and whole-grain bread. ?Eat or drink low-fat dairy products, such as skim milk or low-fat yogurt. ?Fill one fourth of your plate at each meal with low-fat (lean) proteins. Low-fat proteins include fish, chicken without skin, eggs, beans, and tofu. ?Avoid fatty meat, cured and processed meat, or chicken with skin. ?Avoid pre-made or processed food. ?Limit the amount of salt in your diet to less than 1,500 mg each day. ?Do not drink alcohol if: ?Your doctor tells you not to drink. ?You are pregnant, may be pregnant, or are planning to become pregnant. ?If you drink alcohol: ?Limit how much you have to: ?0-1 drink a day for women. ?0-2 drinks a day for men. ?Know how much alcohol is in your drink. In the [place] one drink equals one 12 oz bottle of beer (355 mL), one 5 oz glass of wine (148 mL), or one 1? oz glass of hard liquor (44 mL). ?Lifestyle ? ?Work with your doctor to stay at a healthy weight or to lose weight. Ask your doctor what the best weight is for you. ?Get at least 30 minutes of exercise that causes your heart to beat faster (aerobic exercise) most days of the week. This may include walking, swimming, or biking. ?Get at least 30 minutes of exercise that strengthens your muscles (resistance exercise) at least 3 days a week. This may include lifting weights or doing Pilates. ?Do not smoke or use any products that contain nicotine or tobacco. If you need help quitting, ask your doctor. ?Check your blood pressure at home as told by your doctor. ?Keep all follow-up visits. ?Medicines ?Take over-the-counter and prescription medicines   only as told by your doctor. Follow directions carefully. ?Do not skip doses of blood pressure medicine. The medicine does not work as well if you skip doses. Skipping doses also puts you at risk for problems. ?Ask your doctor about side effects or reactions to medicines that you should watch  for. ?Contact a doctor if: ?You think you are having a reaction to the medicine you are taking. ?You have headaches that keep coming back. ?You feel dizzy. ?You have swelling in your ankles. ?You have trouble with your vision. ?Get help right away if: ?You get a very bad headache. ?You start to feel mixed up (confused). ?You feel weak or numb. ?You feel faint. ?You have very bad pain in your: ?Chest. ?Belly (abdomen). ?You vomit more than once. ?You have trouble breathing. ?These symptoms may be an emergency. Get help right away. Call 911. ?Do not wait to see if the symptoms will go away. ?Do not drive yourself to the hospital. ?Summary ?Hypertension is another name for high blood pressure. ?High blood pressure forces your heart to work harder to pump blood. ?For most people, a normal blood pressure is less than 120/80. ?Making healthy choices can help lower blood pressure. If your blood pressure does not get lower with healthy choices, you may need to take medicine. ?This information is not intended to replace advice given to you by your health care provider. Make sure you discuss any questions you have with your health care provider. ?Document Revised: 04/25/2021 Document Reviewed: 04/25/2021 ?Elsevier Patient Education ? 2023 [employer] Inc[employer] ? ?

## 2022-04-04 LAB — BMP8+EGFR
BUN/Creatinine Ratio: 15 (ref 12–28)
BUN: 18 mg/dL (ref 8–27)
CO2: 27 mmol/L (ref 20–29)
Calcium: 10.7 mg/dL — ABNORMAL HIGH (ref 8.7–10.3)
Chloride: 100 mmol/L (ref 96–106)
Creatinine, Ser: 1.19 mg/dL — ABNORMAL HIGH (ref 0.57–1.00)
Glucose: 108 mg/dL — ABNORMAL HIGH (ref 70–99)
Potassium: 3.7 mmol/L (ref 3.5–5.2)
Sodium: 139 mmol/L (ref 134–144)
eGFR: 48 mL/min/{1.73_m2} — ABNORMAL LOW (ref >59)

## 2022-04-04 LAB — MAGNESIUM: Magnesium: 1.7 mg/dL (ref 1.6–2.3)

## 2022-04-04 LAB — TSH: TSH: 0.903 u[IU]/mL (ref 0.450–4.500)

## 2022-04-04 LAB — HEMOGLOBIN A1C
Est. average glucose Bld gHb Est-mCnc: 111 mg/dL
Hgb A1c MFr Bld: 5.5 % (ref 4.8–5.6)

## 2022-04-06 ENCOUNTER — Other Ambulatory Visit: Payer: Self-pay | Admitting: Internal Medicine

## 2022-04-06 DIAGNOSIS — E7800 Pure hypercholesterolemia, unspecified: Secondary | ICD-10-CM | POA: Insufficient documentation

## 2022-04-06 DIAGNOSIS — Z8616 Personal history of COVID-19: Secondary | ICD-10-CM | POA: Insufficient documentation

## 2022-04-06 DIAGNOSIS — R002 Palpitations: Secondary | ICD-10-CM | POA: Insufficient documentation

## 2022-04-06 DIAGNOSIS — N1831 Chronic kidney disease, stage 3a: Secondary | ICD-10-CM

## 2022-04-10 ENCOUNTER — Ambulatory Visit
Admission: RE | Admit: 2022-04-10 | Discharge: 2022-04-10 | Disposition: A | Discharge status: Home / Self Care | Payer: Medicare Other | Source: Ambulatory Visit | Attending: Internal Medicine | Admitting: Internal Medicine

## 2022-04-10 DIAGNOSIS — N1831 Chronic kidney disease, stage 3a: Secondary | ICD-10-CM

## 2022-04-14 ENCOUNTER — Other Ambulatory Visit: Payer: Self-pay

## 2022-04-14 DIAGNOSIS — N1831 Chronic kidney disease, stage 3a: Secondary | ICD-10-CM

## 2022-05-12 ENCOUNTER — Encounter: Payer: Self-pay | Admitting: Cardiology

## 2022-05-12 ENCOUNTER — Other Ambulatory Visit: Payer: Medicare Other

## 2022-05-12 ENCOUNTER — Ambulatory Visit: Payer: Medicare Other | Admitting: Cardiology

## 2022-05-12 VITALS — BP 150/79 | HR 68 | Temp 98.0°F | Resp 16 | Ht 60.0 in | Wt 155.0 lb

## 2022-05-12 DIAGNOSIS — R9431 Abnormal electrocardiogram [ECG] [EKG]: Secondary | ICD-10-CM

## 2022-05-12 DIAGNOSIS — R002 Palpitations: Secondary | ICD-10-CM

## 2022-05-12 NOTE — Progress Notes (Signed)
Patient referred by Glendale Chard, MD for palpitations  Subjective:   Martha Pham, female    DOB: 21-Jul-1951, 71 y.o.   MRN: [id number]   Chief Complaint  Patient presents with   Palpitations   New Patient (Initial Visit)    HPI  71 y.o. African American female with hypertension, hyperlipidemia, OSA on CPAP, h/o breast cancer, now with complaints of palpitations.  Patient reports palpitations symptoms lasting for several minutes, occurring at rest, unrelated to exertion. Symptoms occur infrequently, last about a month ago. On a separate note, patient has had h/o fatal brain aneurysm in her son, as well as h/o aneurysm of unknown site in her sister.    Past Medical History:  Diagnosis Date   Apnea, sleep    does wear cpap   Arthritis    Cancer ([place])    lt. breast ca-snbx   Chronic kidney disease    Contact lens/glasses fitting    wears contacts or glasses   COPD (chronic obstructive pulmonary disease) ([place])    brother   Heart murmur    Hypercholesteremia    Hyperlipemia    Hypertension    Lower back pain    Lung cancer ([place])    Malaise and fatigue    Personal history of chemotherapy 2013   Sleep apnea    SLEEP STUDY DX 10,CPAP BUT DON'TUSE   Vitamin D deficiency    Wears dentures    upper     Past Surgical History:  Procedure Laterality Date   BREAST BIOPSY Left 05/26/2011   malignant   BREAST BIOPSY Right 10/29/2016   BREAST IMPLANT EXCHANGE Left 09/23/2012   Procedure: REMOVAL OF LEFT EXPANDER/PLACEMENT OF IMPLANT WITH REDUCTION OF RIGHT BREAST;  Surgeon: Theodoro Kos, DO;  Location: [facility];  Service: Plastics;  Laterality: Left;   BREAST LUMPECTOMY W/ NEEDLE LOCALIZATION  06/24/2011   left   BREAST REDUCTION SURGERY Right 09/23/2012   Procedure: MAMMARY REDUCTION  (BREAST);  Surgeon: Theodoro Kos, DO;  Location: [facility];  Service: Plastics;  Laterality: Right;   BREAST SURGERY  08/07/2011   re-exc. left  breast margins   CATARACT EXTRACTION Bilateral    january and february 2022   CYST REMOVAL NECK  08/2013   Dr. Migdalia Dk   LIPOSUCTION Bilateral 09/23/2012   Procedure: LIPOSUCTION;  Surgeon: Theodoro Kos, DO;  Location: [facility];  Service: Plastics;  Laterality: Bilateral;   MASTECTOMY Left 2012   MOUTH SURGERY     TOOTH IMPLANT BOTTOM X2 LFT   PORT-A-CATH REMOVAL Right 09/23/2012   Procedure: REMOVAL PORT-A-CATH;  Surgeon: Theodoro Kos, DO;  Location: [facility];  Service: Plastics;  Laterality: Right;   PORTACATH PLACEMENT  06/24/2011   Procedure: INSERTION PORT-A-CATH;  Surgeon: Judieth Keens, DO;  Location: [facility];  Service: General;  Laterality: Right;  Right mediport placement   RECONSTRUCTION BREAST IMMEDIATE / DELAYED W/ TISSUE EXPANDER  04/2012   left   REDUCTION MAMMAPLASTY Right 2013   SIMPLE MASTECTOMY  09/01/2011   left   TONSILLECTOMY       Social History   Tobacco Use  Smoking Status Never  Smokeless Tobacco Never    Social History   Substance and Sexual Activity  Alcohol Use Not Currently   Alcohol/week: 2.0 standard drinks of alcohol   Types: 2 Glasses of wine per week   Comment: 8 oz occasionally per week     Family History  Problem  Relation Age of Onset   Heart attack Mother    Brain cancer Father    Breast cancer Sister 67   Breast cancer Sister 47   Sleep apnea Neg Hx       Current Outpatient Medications:    acetaminophen (TYLENOL) 500 MG tablet, Take 1,000 mg by mouth every 6 (six) hours as needed for moderate pain. , Disp: , Rfl:    Ascorbic Acid (VITAMIN C) 1000 MG tablet, Take 1,000 mg by mouth daily., Disp: , Rfl:    atorvastatin (LIPITOR) 40 MG tablet, Take 1 tablet (40 mg total) by mouth daily., Disp: 90 tablet, Rfl: 3   calcium carbonate (OS-CAL) 600 MG TABS, Take 600 mg by mouth 2 (two) times daily with a meal.  , Disp: , Rfl:    Carboxymethylcellulose Sodium (REFRESH OP),  Apply 1 drop to eye 2 (two) times daily as needed. For dry eyes, Disp: , Rfl:    Cyanocobalamin (VITAMIN B-12) 2500 MCG SUBL, Place 1 tablet under the tongue daily., Disp: , Rfl:    diazepam (VALIUM) 2 MG tablet, , Disp: , Rfl:    gabapentin (NEURONTIN) 100 MG capsule, Take 1 pill at bedtime as needed for leg pain., Disp: 90 capsule, Rfl: 3   glucose blood (CONTOUR NEXT TEST) test strip, USE AS DIRECTED TO  CHECK  BLOOD  SUGARS  1  TIME  PER  DAY, Disp: 100 each, Rfl: 0   hydrochlorothiazide (HYDRODIURIL) 25 MG tablet, Take 1 tablet (25 mg total) by mouth daily., Disp: 90 tablet, Rfl: 1   losartan (COZAAR) 100 MG tablet, Take 1 tablet (100 mg total) by mouth daily., Disp: 90 tablet, Rfl: 1   Magnesium 400 MG CAPS, Take by mouth daily. , Disp: , Rfl:    metoprolol succinate (TOPROL-XL) 25 MG 24 hr tablet, Take 1 tablet by mouth once daily, Disp: 90 tablet, Rfl: 0   Microlet Lancets MISC, USE AS DIRECTED TO  CHECK  BLOOD  SUGARS  1  TIME  PER  DAY, Disp: 100 each, Rfl: 0   Semaglutide,0.25 or 0.5MG/DOS, (OZEMPIC, 0.25 OR 0.5 MG/DOSE,) 2 MG/3ML SOPN, Inject 0.5 mg into the skin once a week., Disp: 9 mL, Rfl: 1   Vitamin D, Cholecalciferol, 50 MCG (2000 UT) CAPS, Take by mouth. , Disp: , Rfl:    loratadine (CLARITIN) 10 MG tablet, Take 1 tablet (10 mg total) by mouth daily., Disp: 30 tablet, Rfl: 2   Cardiovascular and other pertinent studies:  Reviewed external labs and tests, independently interpreted  EKG 05/12/2022: Sinus rhythm 66 bpm  Probable left atrial enlargement   Recent labs: 04/03/2022: Glucose 108, BUN/Cr 18/1.19. EGFR 48. Na/K 139/3.7.  HbA1C 5.5% TSH 0.9 normal  11/2021: Chol 180, TG 94, HDL 51, LDL 112  05/2021: H/H 11/34. MCV 93. Platelets 200    Review of Systems  Cardiovascular:  Positive for palpitations. Negative for chest pain, dyspnea on exertion, leg swelling and syncope.       Vitals:   05/12/22 1324  BP: (!) 150/79  Pulse: 68  Resp: 16  Temp: 98 F  (36.7 C)  SpO2: 100%     Body mass index is 30.27 kg/m. Filed Weights   05/12/22 1324  Weight: 155 lb (70.3 kg)     Objective:   Physical Exam Vitals and nursing note reviewed.  Constitutional:      General: She is not in acute distress. Neck:     Vascular: No JVD.  Cardiovascular:  Rate and Rhythm: Normal rate and regular rhythm.     Heart sounds: Normal heart sounds. No murmur heard. Pulmonary:     Effort: Pulmonary effort is normal.     Breath sounds: Normal breath sounds. No wheezing or rales.  Musculoskeletal:     Right lower leg: No edema.     Left lower leg: No edema.          Visit diagnoses:   ICD-10-CM   1. Palpitations  R00.2 EKG 12-Lead    LONG TERM MONITOR (3-14 DAYS)    PCV ECHOCARDIOGRAM COMPLETE    2. Abnormal EKG  R94.31 PCV ECHOCARDIOGRAM COMPLETE       Orders Placed This Encounter  Procedures   LONG TERM MONITOR (3-14 DAYS)   EKG 12-Lead   PCV ECHOCARDIOGRAM COMPLETE      Assessment & Recommendations:   71 y.o. African American female with hypertension, hyperlipidemia, OSA on CPAP, h/o breast cancer, now with complaints of palpitations.  Palpitations: Infrequent, but concerning for Afib-risk factors include hypertension, OSA. Recommend 2 week cardiac telemetry, echocardiogram.   Further recommendations after above testing  Thank you for referring the patient to [place]. Please feel free to contact with any questions.   Nigel Mormon, MD Pager: [phone] Office: [phone]

## 2022-05-13 ENCOUNTER — Telehealth: Payer: Self-pay

## 2022-05-13 NOTE — Chronic Care Management (AMB) (Signed)
    [place], No answer, left message of appointment on 05-14-2022 at 11:00 via telephone visit with [name], Pharm D. Notified to have all medications, supplements, blood pressure and/or blood sugar logs available during appointment and to return call if need to reschedule.   Care Gaps: Tdap overdue Covid booster overdue AWV due  Star Rating Drug: Atorvastatin 40 mg- Last filled 04-10-2022 [facility] Oz[facility]pic 0.5 mg- Last filled 04-15-2022 28 DS [facility]  Any gaps in medications fill history? No  [employer] Pharmacist Assistant [phone]

## 2022-05-14 ENCOUNTER — Ambulatory Visit (INDEPENDENT_AMBULATORY_CARE_PROVIDER_SITE_OTHER): Payer: Medicare Other

## 2022-05-14 DIAGNOSIS — I129 Hypertensive chronic kidney disease with stage 1 through stage 4 chronic kidney disease, or unspecified chronic kidney disease: Secondary | ICD-10-CM

## 2022-05-14 DIAGNOSIS — E1122 Type 2 diabetes mellitus with diabetic chronic kidney disease: Secondary | ICD-10-CM

## 2022-05-14 NOTE — Progress Notes (Signed)
Chronic Care Management Pharmacy Note  05/20/2022 Name:  FARHEEN PFAHLER MRN:  [id number] DOB:  Jul 01, 1951  Summary: Patient reports that she has been doing well however the cost of Ozempic is too high at the pharmacy.   Recommendations/Changes made from today's visit: Recommend patient fill out patient assistance paperwork for Ozempic.   Plan: Patient to complete patient assistance paperwork for Ozempic.  Collaborate with PCP team to discuss other medication options.    Subjective: KEI LANGHORST is an 71 y.o. year old female who is a primary patient of Glendale Chard, MD.  The CCM team was consulted for assistance with disease management and care coordination needs.    Engaged with patient by telephone for follow up visit in response to provider referral for pharmacy case management and/or care coordination services.   Consent to Services:  The patient was given information about Chronic Care Management services, agreed to services, and gave verbal consent prior to initiation of services.  Please see initial visit note for detailed documentation.   Patient Care Team: Glendale Chard, MD as PCP - General (Internal Medicine) Mayford Knife, [facility] (Pharmacist) Gardenia Phlegm, NP as Nurse Practitioner (Hematology and Oncology) Star Age, MD as Attending Physician (Neurology)  Recent office visits: 04/03/2022 - PCP OV   Recent consult visits: 05/12/2022 [facility] visits: None in previous 6 months   Objective:  Lab Results  Component Value Date   CREATININE 1.19 (H) 04/03/2022   BUN 18 04/03/2022   EGFR 48 (L) 04/03/2022   GFRNONAA 56 (L) 05/30/2020   GFRAA 65 05/30/2020   NA 139 04/03/2022   K 3.7 04/03/2022   CALCIUM 10.7 (H) 04/03/2022   CO2 27 04/03/2022   GLUCOSE 108 (H) 04/03/2022    Lab Results  Component Value Date/Time   HGBA1C 5.5 04/03/2022 02:21 PM   HGBA1C 7.1 (H) 11/28/2021 10:01 AM   MICROALBUR 10 06/06/2021 09:13  AM   MICROALBUR 10 05/30/2020 10:12 AM    Last diabetic Eye exam:  Lab Results  Component Value Date/Time   HMDIABEYEEXA No Retinopathy 05/14/2021 12:00 AM    Last diabetic Foot exam: No results found for: "HMDIABFOOTEX"   Lab Results  Component Value Date   CHOL 180 11/28/2021   HDL 51 11/28/2021   LDLCALC 112 (H) 11/28/2021   TRIG 94 11/28/2021   CHOLHDL 3.5 11/28/2021       Latest Ref Rng & Units 11/28/2021   10:01 AM 06/06/2021    9:39 AM 11/13/2020    2:59 PM  Hepatic Function  Total Protein 6.0 - 8.5 g/dL 7.3  7.5  8.0   Albumin 3.7 - 4.7 g/dL 4.5  4.6  4.7   AST 0 - 40 IU/L _0 ALT 0 - 32 IU/L _1 Alk Phosphatase 44 - 121 IU/L 76  76  73   Total Bilirubin 0.0 - 1.2 mg/dL 0.4  0.4  0.4     Lab Results  Component Value Date/Time   TSH 0.903 04/03/2022 02:21 PM   TSH 1.090 06/06/2021 09:39 AM   FREET4 1.26 05/12/2019 10:04 AM       Latest Ref Rng & Units 06/06/2021    9:39 AM 05/30/2020   10:12 AM 05/12/2019   10:04 AM  CBC  WBC 3.4 - 10.8 x10E3/uL 5.0  5.8  5.9   Hemoglobin 11.1 - 15.9 g/dL 11.8  12.0  13.2  Hematocrit 34.0 - 46.6 % 34.5  36.2  39.5   Platelets 150 - 450 x10E3/uL 200  209  199     Lab Results  Component Value Date/Time   VD25OH 56 09/12/2013 09:37 AM    Clinical ASCVD: No  The 10-year ASCVD risk score (Arnett DK, et al., 2019) is: 34.1%   Values used to calculate the score:     Age: 71 years     Sex: Female     Is Non-Hispanic African American: Yes     Diabetic: Yes     Tobacco smoker: No     Systolic Blood Pressure: 401 mmHg     Is BP treated: Yes     HDL Cholesterol: 51 mg/dL     Total Cholesterol: 180 mg/dL       04/03/2022   11:37 AM 06/19/2021    4:06 PM 06/06/2021    8:47 AM  Depression screen PHQ 2/9  Decreased Interest 0 0 0  Down, Depressed, Hopeless 0 0 0  PHQ - 2 Score 0 0 0     Social History   Tobacco Use  Smoking Status Never  Smokeless Tobacco Never   BP Readings from Last 3  Encounters:  05/12/22 (!) 150/79  04/03/22 128/66  02/03/22 125/88   Pulse Readings from Last 3 Encounters:  05/12/22 68  04/03/22 71  02/03/22 79   Wt Readings from Last 3 Encounters:  05/12/22 155 lb (70.3 kg)  04/03/22 156 lb 6.4 oz (70.9 kg)  02/03/22 164 lb (74.4 kg)   BMI Readings from Last 3 Encounters:  05/12/22 30.27 kg/m  04/03/22 30.54 kg/m  02/03/22 32.03 kg/m    Assessment/Interventions: Review of patient past medical history, allergies, medications, health status, including review of consultants reports, laboratory and other test data, was performed as part of comprehensive evaluation and provision of chronic care management services.   SDOH:  (Social Determinants of Health) assessments and interventions performed: No SDOH Interventions    Flowsheet Row Chronic Care Management from 10/08/2021 in [facility] Chronic Care Management from 02/07/2020 in [facility]  SDOH Interventions    Financial Strain Interventions -- Intervention Not Indicated  Physical Activity Interventions [employer] --      SDOH Screenings   Food Insecurity: No Food Insecurity (06/19/2021)  Transportation Needs: No Transportation Needs (06/19/2021)  Depression (PHQ2-9): Low Risk  (04/03/2022)  Financial Resource Strain: Low Risk  (06/19/2021)  Physical Activity: Inactive (10/08/2021)  Stress: No Stress Concern Present (06/19/2021)  Tobacco Use: Low Risk  (05/12/2022)    CCM Care Plan  Allergies  Allergen Reactions   Dust Mite Extract Itching   Other     grass    Medications Reviewed Today     Reviewed by Nigel Mormon, MD (Physician) on 05/12/22 at [place] List Status: <None>   Medication Order Taking? Sig Documenting Provider Last Dose Status Informant  acetaminophen (TYLENOL) 500 MG tablet [id number] Yes Take 1,000 mg by mouth every 6 (six) hours as needed for moderate pain.  [provider] Taking Active Self   Ascorbic Acid (VITAMIN C) 1000 MG tablet [id number] Yes Take 1,000 mg by mouth daily. [provider] Taking Active Self  atorvastatin (LIPITOR) 40 MG tablet [id number] Yes Take 1 tablet (40 mg total) by mouth daily. Glendale Chard, MD Taking Active   calcium carbonate (OS-CAL) 600 MG TABS [id number] Yes Take 600 mg by mouth 2 (two) times daily with a meal.  [provider] Taking Active Self  Carboxymethylcellulose Sodium (REFRESH OP) [id number] Yes Apply 1 drop to eye 2 (two) times daily as needed. For dry eyes [provider] Taking Active Self  Cyanocobalamin (VITAMIN B-12) 2500 MCG SUBL [id number] Yes Place 1 tablet under the tongue daily. [provider] Taking Active Self  diazepam (VALIUM) 2 MG tablet [id number] Yes  [provider] Taking Active   gabapentin (NEURONTIN) 100 MG capsule [id number] Yes Take 1 pill at bedtime as needed for leg pain. Ward Givens, NP Taking Active   glucose blood (CONTOUR NEXT TEST) test strip [id number] Yes USE AS DIRECTED TO  CHECK  BLOOD  SUGARS  1  TIME  PER  DAY Glendale Chard, MD Taking Active   hydrochlorothiazide (HYDRODIURIL) 25 MG tablet [id number] Yes Take 1 tablet (25 mg total) by mouth daily. Glendale Chard, MD Taking Active   loratadine (CLARITIN) 10 MG tablet [id number]  Take 1 tablet (10 mg total) by mouth daily. Glendale Chard, MD  Expired 04/03/22 2359   losartan (COZAAR) 100 MG tablet [id number] Yes Take 1 tablet (100 mg total) by mouth daily. Glendale Chard, MD Taking Active   Magnesium 400 MG CAPS [id number] Yes Take by mouth daily.  [provider] Taking Active   metoprolol succinate (TOPROL-XL) 25 MG 24 hr tablet [id number] Yes Take 1 tablet by mouth once daily Glendale Chard, MD Taking Active   Microlet Lancets MISC [id number] Yes USE AS DIRECTED TO  CHECK  BLOOD  SUGARS  1  TIME  PER  DAY Glendale Chard, MD Taking Active   Semaglutide,0.25 or 0.5MG/DOS, (OZEMPIC, 0.25 OR 0.5 MG/DOSE,) 2 MG/3ML  SOPN [id number] Yes Inject 0.5 mg into the skin once a week. Glendale Chard, MD Taking Active   Vitamin D, Cholecalciferol, 50 MCG (2000 UT) CAPS [id number] Yes Take by mouth.  [provider] Taking Active             Patient Active Problem List   Diagnosis Date Noted   Pure hypercholesterolemia 04/06/2022   Palpitations 04/06/2022   Personal history of COVID-19 04/06/2022   Mixed hyperlipidemia 06/06/2021   Class 1 obesity due to excess calories with serious comorbidity and body mass index (BMI) of 33.0 to 33.9 in adult 06/06/2021   Keloid 06/06/2021   History of breast cancer 11/23/2020   Diabetes mellitus with stage 2 chronic kidney disease ([place]) 06/22/2018   Chronic renal disease, stage II 06/22/2018   Parenchymal renal hypertension 06/22/2018   Adult BMI 29.0-29.9 kg/sq m 06/22/2018   Pain in joint, pelvic region and thigh 03/28/2014   Acquired absence of breast and absent nipple 05/05/2012    Immunization History  Administered Date(s) Administered   Fluad Quad(high Dose 65+) 05/30/2020, 06/06/2021   Influenza, High Dose Seasonal PF 05/05/2018, 04/14/2019   Influenza,inj,Quad PF,6+ Mos 04/04/2014   PFIZER(Purple Top)SARS-COV-2 Vaccination 11/03/2019, 11/28/2019, 07/08/2020   PNEUMOCOCCAL CONJUGATE-20 11/28/2021   Pneumococcal Polysaccharide-23 05/12/2019   Zoster Recombinat (Shingrix) 10/22/2021, 04/03/2022    Conditions to be addressed/monitored:  Hyperlipidemia and Diabetes  Care Plan : [place]  Updates made by Mayford Knife, [place] since 05/20/2022 12:00 AM     Problem: HTN, DMII   Priority: High  Onset Date: 04/16/2021     Long-Range Goal: Disease Management   Start Date: 10/30/2020  Recent Progress: On track  Priority: High  Note:   Current Barriers:  Unable to independently afford treatment regimen Unable to independently monitor therapeutic efficacy  Pharmacist Clinical Goal(s):  Patient will verbalize ability to afford  treatment regimen achieve adherence to monitoring guidelines and medication adherence to achieve therapeutic efficacy through collaboration with PharmD and provider.   Interventions: 1:1 collaboration with Glendale Chard, MD regarding development and update of comprehensive plan of care as evidenced by provider attestation and co-signature Inter-disciplinary care team collaboration (see longitudinal plan of care) Comprehensive medication review performed; medication list updated in electronic medical record  Hypertension (BP goal <130/80) -Controlled -Current treatment: Losartan 100 mg tablet once per dayAppropriate, Query effective Hydrochlorothiazide 25 mg tablet once per day Appropriate, Query effective Metoprolol succinate 25 mg tablet once per day Appropriate, Query effective -Current home readings: a little elevated at last visit with Dr. Baird Cancer  -Current dietary habits: she is going to  -Current exercise habits: -Current exercise: she is going on Tuesday and Thursday to exercise, sometimes she is unable to exercise at the gym but she does do house work. She is doing about 60 minutes per week but she would like to increase this amount.  -Denies hypotensive/hypertensive symptoms -Educated on Importance of home blood pressure monitoring; Proper BP monitoring technique; Symptoms of hypotension and importance of maintaining adequate hydration; -Patient has been placed on monitor for two weeks by cardiologist, and it will be sent in to the cardiologist and she will have a visit on 05/30/2022 -Counseled to monitor BP at home at least once per week, document, and provide log at future appointments -Recommended to continue current medication  -The cardiology team is going to have an appointment with Ms. Cashin in two weeks  Diabetes (A1c goal <7%) -Controlled -Current medications: Ozempic 0.5 mg once per week Appropriate, Effective, Safe, Accessible -Current home glucose readings fasting  glucose: patient reports that her BS have been running really good 117  -Denies hypoglycemic/hyperglycemic symptoms -Current meal patterns:  drinks: she is drinking about 50 ounces of water per day but she would  -Current exercise: she is going on Tuesday and Thursday to exercise, sometimes she is unable to exercise at the gym but she does do house work. She is doing about 60 minutes per week but she would like to increase this amount.  -Educated on Complications of diabetes including kidney damage, retinal damage, and cardiovascular disease; Prevention and management of hypoglycemic episodes; -Congratulated Ms. Cirelli on her great focus on her glucose levels  -Counseled to check feet daily and get yearly eye exams -Ms. Mathena reports that one of her friends has an additional Ozempic pen that is not in use that should would like me to look at.  -We discussed the importance of not using medication from others in particular due to concerns about blood borne infections and how medication spoils. She agreed that at this time she is not going to use someone else's medication.  -Recommended to continue current medication Collaborate with PCP for patient to receive samples   Patient Goals/Self-Care Activities Patient will:  - take medications as prescribed as evidenced by patient report and record review  Follow Up Plan: The patient has been provided with contact information for the care management team and has been advised to call with any health related questions or concerns.       Medication Assistance: None required.  Patient affirms current coverage meets needs.  Compliance/Adherence/Medication fill history: Care Gaps: TDAP COVID-19 Vaccine  Ophthalmology Exam Influenza Vaccine  Microalbumin Exam  Star-Rating Drugs: Atorvastatin 40 mg tablet  Losartan 100 mg tablet  Ozempic   Patient's preferred pharmacy is:  [facility]  [phone] -  [place], [place] - [address] [place] [address] Phone: [phone] Fax: [facility] [address], [place] [address] [address] [place] [address] Phone: [phone] Fax: [phone]  Uses pill box? Yes Pt endorses 95% compliance  We discussed: Benefits of medication synchronization, packaging and delivery as well as enhanced pharmacist oversight with [employer]. Patient decided to: Continue current medication management strategy  Care Plan and Follow Up Patient Decision:  Patient agrees to Care Plan and Follow-up.  Plan: The patient has been provided with contact information for the care management team and has been advised to call with any health related questions or concerns.   [name], CPP, PharmD Clinical Pharmacist Practitioner [facility] [phone]

## 2022-05-20 DIAGNOSIS — E1159 Type 2 diabetes mellitus with other circulatory complications: Secondary | ICD-10-CM

## 2022-05-20 DIAGNOSIS — Z7985 Long-term (current) use of injectable non-insulin antidiabetic drugs: Secondary | ICD-10-CM | POA: Diagnosis not present

## 2022-05-20 DIAGNOSIS — I10 Essential (primary) hypertension: Secondary | ICD-10-CM

## 2022-05-20 NOTE — Patient Instructions (Addendum)
Visit Information It was great speaking with you today!  Please let me know if you have any questions about our visit.   Goals Addressed             This Visit's Progress    Track and Manage My Blood Pressure-Hypertension       Timeframe:  Long-Range Goal Priority:  High Start Date:                             Expected End Date:                        Follow Up Date : 08/08/2022   In Progress: - check blood pressure 3 times per week - choose a place to take my blood pressure (home, clinic or office, retail store) - write blood pressure results in a log or diary    Why is this important?   You won't feel high blood pressure, but it can still hurt your blood vessels.  High blood pressure can cause heart or kidney problems. It can also cause a stroke.  Making lifestyle changes like losing a little weight or eating less salt will help.  Checking your blood pressure at home and at different times of the day can help to control blood pressure.  If the doctor prescribes medicine remember to take it the way the doctor ordered.  Call the office if you cannot afford the medicine or if there are questions about it.            Patient Care Plan: CCM Pharmacy Care Plan     Problem Identified: HTN, DMII   Priority: High  Onset Date: 04/16/2021     Long-Range Goal: Disease Management   Start Date: 10/30/2020  Recent Progress: On track  Priority: High  Note:   Current Barriers:  Unable to independently afford treatment regimen Unable to independently monitor therapeutic efficacy  Pharmacist Clinical Goal(s):  Patient will verbalize ability to afford treatment regimen achieve adherence to monitoring guidelines and medication adherence to achieve therapeutic efficacy through collaboration with PharmD and provider.   Interventions: 1:1 collaboration with Glendale Chard, MD regarding development and update of comprehensive plan of care as evidenced by provider attestation and  co-signature Inter-disciplinary care team collaboration (see longitudinal plan of care) Comprehensive medication review performed; medication list updated in electronic medical record  Hypertension (BP goal <130/80) -Controlled -Current treatment: Losartan 100 mg tablet once per dayAppropriate, Query effective Hydrochlorothiazide 25 mg tablet once per day Appropriate, Query effective Metoprolol succinate 25 mg tablet once per day Appropriate, Query effective -Current home readings: a little elevated at last visit with Dr. Baird Cancer  -Current dietary habits: she is going to  -Current exercise habits: -Current exercise: she is going on Tuesday and Thursday to exercise, sometimes she is unable to exercise at the gym but she does do house work. She is doing about 60 minutes per week but she would like to increase this amount.  -Denies hypotensive/hypertensive symptoms -Educated on Importance of home blood pressure monitoring; Proper BP monitoring technique; Symptoms of hypotension and importance of maintaining adequate hydration; -Patient has been placed on monitor for two weeks by cardiologist, and it will be sent in to the cardiologist and she will have a visit on 05/30/2022 -Counseled to monitor BP at home at least once per week, document, and provide log at future appointments -Recommended to continue current medication  -The  cardiology team is going to have an appointment with Ms. Milbourn in two weeks  Diabetes (A1c goal <7%) -Controlled -Current medications: Ozempic 0.5 mg once per week Appropriate, Effective, Safe, Accessible -Current home glucose readings fasting glucose: patient reports that her BS have been running really good 117  -Denies hypoglycemic/hyperglycemic symptoms -Current meal patterns:  drinks: she is drinking about 50 ounces of water per day but she would  -Current exercise: she is going on Tuesday and Thursday to exercise, sometimes she is unable to exercise at the gym  but she does do house work. She is doing about 60 minutes per week but she would like to increase this amount.  -Educated on Complications of diabetes including kidney damage, retinal damage, and cardiovascular disease; Prevention and management of hypoglycemic episodes; -Congratulated Ms. Dawn on her great focus on her glucose levels  -Counseled to check feet daily and get yearly eye exams -Ms. Navarette reports that one of her friends has an additional Ozempic pen that is not in use that should would like me to look at.  -We discussed the importance of not using medication from others in particular due to concerns about blood borne infections and how medication spoils. She agreed that at this time she is not going to use someone else's medication.  -Recommended to continue current medication Collaborate with PCP for patient to receive samples   Patient Goals/Self-Care Activities Patient will:  - take medications as prescribed as evidenced by patient report and record review  Follow Up Plan: The patient has been provided with contact information for the care management team and has been advised to call with any health related questions or concerns.       Patient agreed to services and verbal consent obtained.   The patient verbalized understanding of instructions, educational materials, and care plan provided today and agreed to receive a mailed copy of patient instructions, educational materials, and care plan.   [name], PharmD Clinical Pharmacist [facility] [phone]

## 2022-05-22 ENCOUNTER — Ambulatory Visit: Payer: Medicare Other | Admitting: Adult Health

## 2022-05-28 ENCOUNTER — Telehealth: Payer: Self-pay

## 2022-05-28 NOTE — Telephone Encounter (Signed)
Called Ms. Solomon to inform her that patient assistance has been approved for Ozempic 0.5 mg once per week. The medication will come in to the office with in 10-14 business days. Left non-descriptive voicemail to call back.   [name], CPP, PharmD Clinical Pharmacist Practitioner [facility] [phone]

## 2022-05-30 ENCOUNTER — Ambulatory Visit: Payer: Medicare Other

## 2022-05-30 DIAGNOSIS — R002 Palpitations: Secondary | ICD-10-CM

## 2022-05-30 DIAGNOSIS — R9431 Abnormal electrocardiogram [ECG] [EKG]: Secondary | ICD-10-CM

## 2022-06-03 ENCOUNTER — Telehealth: Payer: Self-pay

## 2022-06-03 NOTE — Chronic Care Management (AMB) (Signed)
06-03-2022: 1287 Ozempic application uploaded to mail.  Jane Lew Pharmacist Assistant [phone]

## 2022-06-18 ENCOUNTER — Encounter: Payer: Self-pay | Admitting: Internal Medicine

## 2022-06-25 ENCOUNTER — Ambulatory Visit (INDEPENDENT_AMBULATORY_CARE_PROVIDER_SITE_OTHER): Payer: Medicare Other

## 2022-06-25 VITALS — BP 122/62 | HR 76 | Temp 97.9°F | Ht 58.4 in | Wt 150.4 lb

## 2022-06-25 DIAGNOSIS — Z0000 Encounter for general adult medical examination without abnormal findings: Secondary | ICD-10-CM

## 2022-06-25 DIAGNOSIS — Z23 Encounter for immunization: Secondary | ICD-10-CM | POA: Diagnosis not present

## 2022-06-25 DIAGNOSIS — N182 Chronic kidney disease, stage 2 (mild): Secondary | ICD-10-CM | POA: Diagnosis not present

## 2022-06-25 DIAGNOSIS — E1122 Type 2 diabetes mellitus with diabetic chronic kidney disease: Secondary | ICD-10-CM | POA: Diagnosis not present

## 2022-06-25 LAB — POCT URINALYSIS DIPSTICK
Bilirubin, UA: NEGATIVE
Blood, UA: NEGATIVE
Glucose, UA: NEGATIVE
Ketones, UA: NEGATIVE
Leukocytes, UA: NEGATIVE
Nitrite, UA: NEGATIVE
Protein, UA: NEGATIVE
Spec Grav, UA: 1.01 (ref 1.010–1.025)
Urobilinogen, UA: 0.2 E.U./dL
pH, UA: 6 (ref 5.0–8.0)

## 2022-06-25 NOTE — Progress Notes (Signed)
Subjective:   Martha Pham is a 71 y.o. female who presents for Medicare Annual (Subsequent) preventive examination.  Review of Systems     Cardiac Risk Factors include: advanced age (>52men, >80 women);diabetes mellitus;dyslipidemia;hypertension;obesity (BMI >30kg/m2)     Objective:    Today's Vitals   06/25/22 1539 06/25/22 1547  BP: 122/62   Pulse: 76   Temp: 97.9 F (36.6 C)   TempSrc: Oral   SpO2: 93%   Weight: 150 lb 6.4 oz (68.2 kg)   Height: 4' 10.4" (1.483 m)   PainSc:  5    Body mass index is 31 kg/m.     06/25/2022    3:58 PM 06/19/2021    4:05 PM 01/16/2021    8:46 AM 05/30/2020    9:11 AM 05/12/2019    9:35 AM 02/26/2018   12:14 PM 10/02/2015   11:31 AM  Advanced Directives  Does Patient Have a Medical Advance Directive? No No No No No No No  Would patient like information on creating a medical advance directive? No - Patient declined No - Patient declined  Yes (MAU/Ambulatory/Procedural Areas - Information given) Yes (Inpatient - patient requests chaplain consult to create a medical advance directive)      Current Medications (verified) Outpatient Encounter Medications as of 06/25/2022  Medication Sig   acetaminophen (TYLENOL) 500 MG tablet Take 1,000 mg by mouth every 6 (six) hours as needed for moderate pain.    Ascorbic Acid (VITAMIN C) 1000 MG tablet Take 1,000 mg by mouth daily.   atorvastatin (LIPITOR) 40 MG tablet Take 1 tablet (40 mg total) by mouth daily.   calcium carbonate (OS-CAL) 600 MG TABS Take 600 mg by mouth 2 (two) times daily with a meal.     Carboxymethylcellulose Sodium (REFRESH OP) Apply 1 drop to eye 2 (two) times daily as needed. For dry eyes   Cyanocobalamin (VITAMIN B-12) 2500 MCG SUBL Place 1 tablet under the tongue daily.   gabapentin (NEURONTIN) 100 MG capsule Take 1 pill at bedtime as needed for leg pain.   glucose blood (CONTOUR NEXT TEST) test strip USE AS DIRECTED TO  CHECK  BLOOD  SUGARS  1  TIME  PER  DAY    hydrochlorothiazide (HYDRODIURIL) 25 MG tablet Take 1 tablet (25 mg total) by mouth daily.   losartan (COZAAR) 100 MG tablet Take 1 tablet (100 mg total) by mouth daily.   Magnesium 400 MG CAPS Take by mouth daily.    metoprolol succinate (TOPROL-XL) 25 MG 24 hr tablet Take 1 tablet by mouth once daily   Microlet Lancets MISC USE AS DIRECTED TO  CHECK  BLOOD  SUGARS  1  TIME  PER  DAY   Semaglutide,0.25 or 0.5MG /DOS, (OZEMPIC, 0.25 OR 0.5 MG/DOSE,) 2 MG/3ML SOPN Inject 0.5 mg into the skin once a week.   Vitamin D, Cholecalciferol, 50 MCG (2000 UT) CAPS Take by mouth.    diazepam (VALIUM) 2 MG tablet  (Patient not taking: Reported on 06/25/2022)   loratadine (CLARITIN) 10 MG tablet Take 1 tablet (10 mg total) by mouth daily.   No facility-administered encounter medications on file as of 06/25/2022.    Allergies (verified) Dust mite extract and Other   History: Past Medical History:  Diagnosis Date   Apnea, sleep    does wear cpap   Arthritis    Cancer ([place])    lt. breast ca-snbx   Chronic kidney disease    Contact lens/glasses fitting    wears contacts  or glasses   COPD (chronic obstructive pulmonary disease) (HCC)    brother   Heart murmur    Hypercholesteremia    Hyperlipemia    Hypertension    Lower back pain    Lung cancer ([place])    Malaise and fatigue    Personal history of chemotherapy 2013   Sleep apnea    SLEEP STUDY DX 10,CPAP BUT DON'TUSE   Vitamin D deficiency    Wears dentures    upper   Past Surgical History:  Procedure Laterality Date   BREAST BIOPSY Left 05/26/2011   malignant   BREAST BIOPSY Right 10/29/2016   BREAST IMPLANT EXCHANGE Left 09/23/2012   Procedure: REMOVAL OF LEFT EXPANDER/PLACEMENT OF IMPLANT WITH REDUCTION OF RIGHT BREAST;  Surgeon: Theodoro Kos, DO;  Location: [place];  Service: Plastics;  Laterality: Left;   BREAST LUMPECTOMY W/ NEEDLE LOCALIZATION  06/24/2011   left   BREAST REDUCTION SURGERY Right 09/23/2012    Procedure: MAMMARY REDUCTION  (BREAST);  Surgeon: Theodoro Kos, DO;  Location: [place];  Service: Plastics;  Laterality: Right;   BREAST SURGERY  08/07/2011   re-exc. left breast margins   CATARACT EXTRACTION Bilateral    january and february 2022   CYST REMOVAL NECK  08/2013   Dr. Migdalia Dk   LIPOSUCTION Bilateral 09/23/2012   Procedure: LIPOSUCTION;  Surgeon: Theodoro Kos, DO;  Location: [place];  Service: Plastics;  Laterality: Bilateral;   MASTECTOMY Left 2012   MOUTH SURGERY     TOOTH IMPLANT BOTTOM X2 LFT   PORT-A-CATH REMOVAL Right 09/23/2012   Procedure: REMOVAL PORT-A-CATH;  Surgeon: Theodoro Kos, DO;  Location: [place];  Service: Plastics;  Laterality: Right;   PORTACATH PLACEMENT  06/24/2011   Procedure: INSERTION PORT-A-CATH;  Surgeon: Judieth Keens, DO;  Location: [place];  Service: General;  Laterality: Right;  Right mediport placement   RECONSTRUCTION BREAST IMMEDIATE / DELAYED W/ TISSUE EXPANDER  04/2012   left   REDUCTION MAMMAPLASTY Right 2013   SIMPLE MASTECTOMY  09/01/2011   left   TONSILLECTOMY     tooth implant removed     10/2021   Family History  Problem Relation Age of Onset   Heart attack Mother    Brain cancer Father    Breast cancer Sister 32   Breast cancer Sister 75   Sleep apnea Neg Hx    Social History   Socioeconomic History   Marital status: Widowed    Spouse name: Not on file   Number of children: 1   Years of education: 14   Highest education level: Not on file  Occupational History   Occupation: Retired  Tobacco Use   Smoking status: Never   Smokeless tobacco: Never  Vaping Use   Vaping Use: Never used  Substance and Sexual Activity   Alcohol use: Not Currently    Alcohol/week: 2.0 standard drinks of alcohol    Types: 2 Glasses of wine per week    Comment: 8 oz occasionally per week   Drug use: No   Sexual activity: Not Currently    Partners: Male     Birth control/protection: Post-menopausal  Other Topics Concern   Not on file  Social History Narrative   Daughter [place], [place]   Social Determinants of Health   Financial Resource Strain: Low Risk  (06/25/2022)   Overall Financial Resource Strain (CARDIA)    Difficulty of Paying Living Expenses: Not hard at all  Food Insecurity: No Food Insecurity (06/25/2022)   Hunger Vital Sign    Worried About Running Out of Food in the Last Year: Never true    Ran Out of Food in the Last Year: Never true  Transportation Needs: No Transportation Needs (06/25/2022)   PRAPARE - Hydrologist (Medical): No    Lack of Transportation (Non-Medical): No  Physical Activity: Insufficiently Active (06/25/2022)   Exercise Vital Sign    Days of Exercise per Week: 2 days    Minutes of Exercise per Session: 50 min  Stress: No Stress Concern Present (06/25/2022)   [place]    Feeling of Stress : Not at all  Social Connections: Not on file    Tobacco Counseling Counseling given: Not Answered   Clinical Intake:  Pre-visit preparation completed: Yes  Pain : 0-10 Pain Score: 5  Pain Type: Chronic pain Pain Location: Groin Pain Orientation: Right Pain Descriptors / Indicators: Aching Pain Onset: More than a month ago Pain Frequency: Constant     Nutritional Status: BMI > 30  Obese Nutritional Risks: None Diabetes: Yes  How often do you need to have someone help you when you read instructions, pamphlets, or other written materials from your doctor or pharmacy?: 1 - Never  Diabetic? Yes Nutrition Risk Assessment:  Has the patient had any N/V/D within the last 2 months?  No  Does the patient have any non-healing wounds?  No  Has the patient had any unintentional weight loss or weight gain?  No   Diabetes:  Is the patient diabetic?  Yes  If diabetic, was a CBG obtained today?  No  Did the patient  bring in their glucometer from home?  No  How often do you monitor your CBG's? Three times weekly.   Financial Strains and Diabetes Management:  Are you having any financial strains with the device, your supplies or your medication? No .  Does the patient want to be seen by Chronic Care Management for management of their diabetes?  No  Would the patient like to be referred to a Nutritionist or for Diabetic Management?  No   Diabetic Exams:  Diabetic Eye Exam: Overdue for diabetic eye exam. Pt has been advised about the importance in completing this exam. Patient advised to call and schedule an eye exam. Diabetic Foot Exam: Overdue, Pt has been advised about the importance in completing this exam. Pt is scheduled for diabetic foot exam on next appointment.   Interpreter Needed?: No  Information entered by :: NAllen LPN   Activities of Daily Living    06/25/2022    4:01 PM  In your present state of health, do you have any difficulty performing the following activities:  Hearing? 0  Vision? 0  Difficulty concentrating or making decisions? 0  Walking or climbing stairs? 0  Dressing or bathing? 0  Doing errands, shopping? 0  Preparing Food and eating ? N  Using the Toilet? N  In the past six months, have you accidently leaked urine? N  Do you have problems with loss of bowel control? N  Managing your Medications? N  Managing your Finances? N  Housekeeping or managing your Housekeeping? N    Patient Care Team: Glendale Chard, MD as PCP - General (Internal Medicine) Mayford Knife, [facility] (Pharmacist) Delice Bison Charlestine Massed, NP as Nurse Practitioner (Hematology and Oncology) Star Age, MD as Attending Physician (Neurology)  Indicate any recent Medical Services  you may have received from other than [facility] providers in the past year (date may be approximate).     Assessment:   This is a routine wellness examination for Martha Pham.  Hearing/Vision screen Vision Screening -  Comments:: Regular eye exams, Dr. Sherral Hammers  Dietary issues and exercise activities discussed: Current Exercise Habits: Home exercise routine, Type of exercise: yoga, Time (Minutes): 45, Frequency (Times/Week): 3, Weekly Exercise (Minutes/Week): 135   Goals Addressed             This Visit's Progress    Patient Stated       06/25/2022, no goals       Depression Screen    06/25/2022    4:01 PM 04/03/2022   11:37 AM 06/19/2021    4:06 PM 06/06/2021    8:47 AM 05/30/2020    9:13 AM 05/30/2020    8:45 AM 05/12/2019    9:43 AM  PHQ 2/9 Scores  PHQ - 2 Score 0 0 0 0 0 0 1    Fall Risk    06/25/2022    4:00 PM 04/03/2022   11:37 AM 06/19/2021    4:06 PM 06/06/2021    8:47 AM 05/30/2020    9:13 AM  [place] in the past year? 0 0 0 0 0  Number falls in past yr: 0 0  0   Injury with Fall? 0 0  0   Risk for fall due to : Medication side effect No Fall Risks Medication side effect  Medication side effect  Follow up Falls prevention discussed;Education provided;Falls evaluation completed Falls evaluation completed Falls evaluation completed;Education provided;Falls prevention discussed  Falls evaluation completed;Education provided;Falls prevention discussed    FALL RISK PREVENTION PERTAINING TO THE HOME:  Any stairs in or around the home? Yes  If so, are there any without handrails? No  Home free of loose throw rugs in walkways, pet beds, electrical cords, etc? Yes  Adequate lighting in your home to reduce risk of falls? Yes   ASSISTIVE DEVICES UTILIZED TO PREVENT FALLS:  Life alert? No  Use of a cane, walker or w/c? No  Grab bars in the bathroom? Yes  Shower chair or bench in shower? Yes  Elevated toilet seat or a handicapped toilet? Yes   TIMED UP AND GO:  Was the test performed? Yes .  Length of time to ambulate 10 feet: 5 sec.   Gait steady and fast without use of assistive device  Cognitive Function:        06/25/2022    4:02 PM 06/19/2021    4:07 PM  05/30/2020    9:14 AM 05/12/2019    9:36 AM  6CIT Screen  What Year? 0 points 0 points 0 points 0 points  What month? 0 points 0 points 0 points 0 points  What time? 0 points 0 points 0 points 0 points  Count back from 20 0 points 0 points 0 points 0 points  Months in reverse 0 points 0 points 2 points 0 points  Repeat phrase 8 points 2 points 0 points 8 points  Total Score 8 points 2 points 2 points 8 points    Immunizations Immunization History  Administered Date(s) Administered   Fluad Quad(high Dose 65+) 05/30/2020, 06/06/2021, 06/25/2022   Influenza, High Dose Seasonal PF 05/05/2018, 04/14/2019   Influenza,inj,Quad PF,6+ Mos 04/04/2014   PFIZER(Purple Top)SARS-COV-2 Vaccination 11/03/2019, 11/28/2019, 07/08/2020   PNEUMOCOCCAL CONJUGATE-20 11/28/2021   Pneumococcal Polysaccharide-23 05/12/2019   Zoster Recombinat (Shingrix) 10/22/2021,  04/03/2022      TDAP status: Due, Education has been provided regarding the importance of this vaccine. Advised may receive this vaccine at local pharmacy or Health Dept. Aware to provide a copy of the vaccination record if obtained from local pharmacy or Health Dept. Verbalized acceptance and understanding.  Flu Vaccine status: Completed at today's visit  Pneumococcal vaccine status: Up to date  Covid-19 vaccine status: Completed vaccines  Qualifies for Shingles Vaccine? Yes   Zostavax completed Yes   Shingrix Completed?: Yes  Screening Tests Health Maintenance  Topic Date Due   DTaP/Tdap/Td (1 - Tdap) Never done   COVID-19 Vaccine (4 - 2023-24 season) 03/21/2022   OPHTHALMOLOGY EXAM  05/14/2022   Diabetic kidney evaluation - Urine ACR  06/06/2022   FOOT EXAM  06/06/2022   HEMOGLOBIN A1C  10/02/2022   COLONOSCOPY (Pts 45-42yrs Insurance coverage will need to be confirmed)  12/26/2022   Diabetic kidney evaluation - GFR measurement  04/04/2023   Medicare Annual Wellness (AWV)  06/26/2023   MAMMOGRAM  09/13/2023   Pneumonia Vaccine  72+ Years old  Completed   INFLUENZA VACCINE  Completed   DEXA SCAN  Completed   Hepatitis C Screening  Completed   Zoster Vaccines- Shingrix  Completed   HPV VACCINES  Aged Out    Health Maintenance  Health Maintenance Due  Topic Date Due   DTaP/Tdap/Td (1 - Tdap) Never done   COVID-19 Vaccine (4 - 2023-24 season) 03/21/2022   OPHTHALMOLOGY EXAM  05/14/2022   Diabetic kidney evaluation - Urine ACR  06/06/2022   FOOT EXAM  06/06/2022    Colorectal cancer screening: Type of screening: Colonoscopy. Completed 12/25/2021. Repeat every 5 years  Mammogram status: Completed 09/12/2021. Repeat every year  Bone Density status: Completed 11/05/2016.   Lung Cancer Screening: (Low Dose CT Chest recommended if Age 70-80 years, 30 pack-year currently smoking OR have quit w/in 15years.) does not qualify.   Lung Cancer Screening Referral: no  Additional Screening:  Hepatitis C Screening: does qualify; Completed 01/28/2012  Vision Screening: Recommended annual ophthalmology exams for early detection of glaucoma and other disorders of the eye. Is the patient up to date with their annual eye exam?  Yes  Who is the provider or what is the name of the office in which the patient attends annual eye exams? Dr. Sherral Hammers If pt is not established with a provider, would they like to be referred to a provider to establish care? No .   Dental Screening: Recommended annual dental exams for proper oral hygiene  Community Resource Referral / Chronic Care Management: CRR required this visit?  No   CCM required this visit?  No      Plan:     I have personally reviewed and noted the following in the patient's chart:   Medical and social history Use of alcohol, tobacco or illicit drugs  Current medications and supplements including opioid prescriptions. Patient is not currently taking opioid prescriptions. Functional ability and status Nutritional status Physical activity Advanced directives List of  other physicians Hospitalizations, surgeries, and ER visits in previous 12 months Vitals Screenings to include cognitive, depression, and falls Referrals and appointments  In addition, I have reviewed and discussed with patient certain preventive protocols, quality metrics, and best practice recommendations. A written personalized care plan for preventive services as well as general preventive health recommendations were provided to patient.     Kellie Simmering, LPN   03/0/0923   Nurse Notes: none

## 2022-06-25 NOTE — Patient Instructions (Signed)
Martha Pham , Thank you for taking time to come for your Medicare Wellness Visit. I appreciate your ongoing commitment to your health goals. Please review the following plan we discussed and let me know if I can assist you in the future.   These are the goals we discussed:  Goals      Patient Stated     05/30/2020, wants to lose 30 pounds     Patient Stated     06/19/2021, wants to get back on exercise regimen and eating healthy     Martha Pham (see longitudinal plan of care for additional care plan information)  Current Barriers:  Chronic Disease Management support, education, and care coordination needs related to Hypertension, Hyperlipidemia, Diabetes, and Osteopenia/Osteoporosis Screening   Hypertension BP Readings from Last 3 Encounters:  12/12/19 (!) 147/72  12/05/19 128/70  11/21/19 118/66  Pharmacist Clinical Goal(s): Over the next 180 days, patient will work with PharmD and providers to maintain BP goal <140/90 Current regimen:  Hydrochlorothiazide 25mg  daily Losartan 100mg  daily Metoprolol succinate 25mg  daily Interventions: Provided dietary and exercise recommendations Patient self care activities - Over the next 180 days, patient will: Check BP weekly and if symptomatic, document, and provide at future appointments Ensure daily salt intake < 2300 mg/day Exercise for 30 minutes daily 5 times per week (150 minutes total per week)  Hyperlipidemia Lab Results  Component Value Date/Time   LDLCALC 96 05/12/2019 10:04 AM  Pharmacist Clinical Goal(s): Over the next 90 days, patient will work with PharmD and providers to achieve LDL goal < 70 Current regimen:  Atorvastatin 20mg  daily Omega-3 fatty acids 2000mg  2 capsules every evening Interventions: Provided dietary and exercise recommendations Patient complained of size and smell of omega 3 capsules Advised patient she could try odorless fish oil or try krill oil (smaller  capsules) Patient self care activities - Over the next 90 days, patient will: Exercise for 30 minutes daily 5 times per week (150 minutes total per week) Try odorless fish oil or krill oil  Prediabetes Lab Results  Component Value Date/Time   HGBA1C 5.7 (H) 11/21/2019 04:10 PM   HGBA1C 5.6 05/12/2019 10:04 AM  Pharmacist Clinical Goal(s): Over the next 180 days, patient will work with PharmD and providers to maintain A1c goal <7% Current regimen:  N/A Interventions: Provided dietary and exercise recommendations Recommend patient increase water intake to 64 ounces daily Discussed importance of diet and exercise to lower A1c back to normal range Patient self care activities - Over the next 180 days, patient will: Check blood sugar occasionally, document, and provide at future appointments Contact provider with any episodes of hypoglycemia Exercise for 30 minutes daily 5 times per week (150 minutes total per week)  Osteopenia/Osteoporosis Screening Pharmacist Clinical Goal(s) Over the next 180 days, patient will work with PharmD and providers to screen patient for osteopenia/osteoporosis and supplement appropriately Current regimen:  N/A Interventions: Recommend DEXA scan (last order expired) Recommend Vitamin D level Recommend Vitamin D 2000 units daily and 1200mg  calcium daily Recommend weight-bearing exercises 3 times weekly to build and maintain bone density Patient self care activities - Over the next 180 days, patient will: Have bone density scan performed Start taking Vitamin D3 2000 units daily and calcium 1200mg  daily Start weight-bearing exercises three times weekly  Medication management Pharmacist Clinical Goal(s): Over the next 180 days, patient will work with PharmD and providers to maintain optimal medication adherence Current pharmacy: [facility] Interventions Comprehensive medication  review performed. Continue current medication management strategy Patient self  care activities - Over the next 180 days, patient will: Focus on medication adherence by continued use of pill box Take medications as prescribed Report any questions or concerns to PharmD and/or provider(s)        Track and Manage My Blood Pressure-Hypertension     Timeframe:  Long-Range Goal Priority:  High Start Date:                             Expected End Date:                        Follow Up Date : 08/08/2022   In Progress: - check blood pressure 3 times per week - choose a place to take my blood pressure (home, clinic or office, retail store) - write blood pressure results in a log or diary    Why is this important?   You won't feel high blood pressure, but it can still hurt your blood vessels.  High blood pressure can cause heart or kidney problems. It can also cause a stroke.  Making lifestyle changes like losing a little weight or eating less salt will help.  Checking your blood pressure at home and at different times of the day can help to control blood pressure.  If the doctor prescribes medicine remember to take it the way the doctor ordered.  Call the office if you cannot afford the medicine or if there are questions about it.         Weight (lb) < 200 lb (90.7 kg)     At least 20 lbs and increase her exercise        This is a list of the screening recommended for you and due dates:  Health Maintenance  Topic Date Due   DTaP/Tdap/Td vaccine (1 - Tdap) Never done   COVID-19 Vaccine (4 - 2023-24 season) 03/21/2022   Eye exam for diabetics  05/14/2022   Yearly kidney health urinalysis for diabetes  06/06/2022   Medicare Annual Wellness Visit  06/19/2022   Complete foot exam   06/06/2022   Flu Shot  10/19/2022*   Hemoglobin A1C  10/02/2022   Colon Cancer Screening  12/26/2022   Yearly kidney function blood test for diabetes  04/04/2023   Mammogram  09/13/2023   Pneumonia Vaccine  Completed   DEXA scan (bone density measurement)  Completed   Hepatitis C  Screening: USPSTF Recommendation to screen - Ages 69-79 yo.  Completed   Zoster (Shingles) Vaccine  Completed   HPV Vaccine  Aged Out  *Topic was postponed. The date shown is not the original due date.    Advanced directives: Advance directive discussed with you today. Even though you declined this today please call our office should you change your mind and we can give you the proper paperwork for you to fill out.  Conditions/risks identified: none  Next appointment: Follow up in one year for your annual wellness visit    Preventive Care 65 Years and Older, Female Preventive care refers to lifestyle choices and visits with your health care provider that can promote health and wellness. What does preventive care include? A yearly physical exam. This is also called an annual well check. Dental exams once or twice a year. Routine eye exams. Ask your health care provider how often you should have your eyes checked. Personal lifestyle choices, including: Daily  care of your teeth and gums. Regular physical activity. Eating a healthy diet. Avoiding tobacco and drug use. Limiting alcohol use. Practicing safe sex. Taking low-dose aspirin every day. Taking vitamin and mineral supplements as recommended by your health care provider. What happens during an annual well check? The services and screenings done by your health care provider during your annual well check will depend on your age, overall health, lifestyle risk factors, and family history of disease. Counseling  Your health care provider may ask you questions about your: Alcohol use. Tobacco use. Drug use. Emotional well-being. Home and relationship well-being. Sexual activity. Eating habits. History of falls. Memory and ability to understand (cognition). Work and work Statistician. Reproductive health. Screening  You may have the following tests or measurements: Height, weight, and BMI. Blood pressure. Lipid and  cholesterol levels. These may be checked every 5 years, or more frequently if you are over 100 years old. Skin check. Lung cancer screening. You may have this screening every year starting at age 68 if you have a 30-pack-year history of smoking and currently smoke or have quit within the past 15 years. Fecal occult blood test (FOBT) of the stool. You may have this test every year starting at age 5. Flexible sigmoidoscopy or colonoscopy. You may have a sigmoidoscopy every 5 years or a colonoscopy every 10 years starting at age 73. Hepatitis C blood test. Hepatitis B blood test. Sexually transmitted disease (STD) testing. Diabetes screening. This is done by checking your blood sugar (glucose) after you have not eaten for a while (fasting). You may have this done every 1-3 years. Bone density scan. This is done to screen for osteoporosis. You may have this done starting at age 47. Mammogram. This may be done every 1-2 years. Talk to your health care provider about how often you should have regular mammograms. Talk with your health care provider about your test results, treatment options, and if necessary, the need for more tests. Vaccines  Your health care provider may recommend certain vaccines, such as: Influenza vaccine. This is recommended every year. Tetanus, diphtheria, and acellular pertussis (Tdap, Td) vaccine. You may need a Td booster every 10 years. Zoster vaccine. You may need this after age 41. Pneumococcal 13-valent conjugate (PCV13) vaccine. One dose is recommended after age 75. Pneumococcal polysaccharide (PPSV23) vaccine. One dose is recommended after age 33. Talk to your health care provider about which screenings and vaccines you need and how often you need them. This information is not intended to replace advice given to you by your health care provider. Make sure you discuss any questions you have with your health care provider. Document Released: 08/03/2015 Document Revised:  03/26/2016 Document Reviewed: 05/08/2015 Elsevier Interactive Patient Education  2017 [place] Prevention in the Home Falls can cause injuries. They can happen to people of all ages. There are many things you can do to make your home safe and to help prevent falls. What can I do on the outside of my home? Regularly fix the edges of walkways and driveways and fix any cracks. Remove anything that might make you trip as you walk through a door, such as a raised step or threshold. Trim any bushes or trees on the path to your home. Use bright outdoor lighting. Clear any walking paths of anything that might make someone trip, such as rocks or tools. Regularly check to see if handrails are loose or broken. Make sure that both sides of any steps have handrails. Any  raised decks and porches should have guardrails on the edges. Have any leaves, snow, or ice cleared regularly. Use sand or salt on walking paths during winter. Clean up any spills in your garage right away. This includes oil or grease spills. What can I do in the bathroom? Use night lights. Install grab bars by the toilet and in the tub and shower. Do not use towel bars as grab bars. Use non-skid mats or decals in the tub or shower. If you need to sit down in the shower, use a plastic, non-slip stool. Keep the floor dry. Clean up any water that spills on the floor as soon as it happens. Remove soap buildup in the tub or shower regularly. Attach bath mats securely with double-sided non-slip rug tape. Do not have throw rugs and other things on the floor that can make you trip. What can I do in the bedroom? Use night lights. Make sure that you have a light by your bed that is easy to reach. Do not use any sheets or blankets that are too big for your bed. They should not hang down onto the floor. Have a firm chair that has side arms. You can use this for support while you get dressed. Do not have throw rugs and other things  on the floor that can make you trip. What can I do in the kitchen? Clean up any spills right away. Avoid walking on wet floors. Keep items that you use a lot in easy-to-reach places. If you need to reach something above you, use a strong step stool that has a grab bar. Keep electrical cords out of the way. Do not use floor polish or wax that makes floors slippery. If you must use wax, use non-skid floor wax. Do not have throw rugs and other things on the floor that can make you trip. What can I do with my stairs? Do not leave any items on the stairs. Make sure that there are handrails on both sides of the stairs and use them. Fix handrails that are broken or loose. Make sure that handrails are as long as the stairways. Check any carpeting to make sure that it is firmly attached to the stairs. Fix any carpet that is loose or worn. Avoid having throw rugs at the top or bottom of the stairs. If you do have throw rugs, attach them to the floor with carpet tape. Make sure that you have a light switch at the top of the stairs and the bottom of the stairs. If you do not have them, ask someone to add them for you. What else can I do to help prevent falls? Wear shoes that: Do not have high heels. Have rubber bottoms. Are comfortable and fit you well. Are closed at the toe. Do not wear sandals. If you use a stepladder: Make sure that it is fully opened. Do not climb a closed stepladder. Make sure that both sides of the stepladder are locked into place. Ask someone to hold it for you, if possible. Clearly mark and make sure that you can see: Any grab bars or handrails. First and last steps. Where the edge of each step is. Use tools that help you move around (mobility aids) if they are needed. These include: Canes. Walkers. Scooters. Crutches. Turn on the lights when you go into a dark area. Replace any light bulbs as soon as they burn out. Set up your furniture so you have a clear path. Avoid  moving your  furniture around. If any of your floors are uneven, fix them. If there are any pets around you, be aware of where they are. Review your medicines with your doctor. Some medicines can make you feel dizzy. This can increase your chance of falling. Ask your doctor what other things that you can do to help prevent falls. This information is not intended to replace advice given to you by your health care provider. Make sure you discuss any questions you have with your health care provider. Document Released: 05/03/2009 Document Revised: 12/13/2015 Document Reviewed: 08/11/2014 Elsevier Interactive Patient Education  2017 [employer].

## 2022-06-27 LAB — MICROALBUMIN / CREATININE URINE RATIO
Creatinine, Urine: 45.8 mg/dL
Microalb/Creat Ratio: <7 mg/g creat (ref 0–29)
Microalbumin, Urine: <3 ug/mL

## 2022-06-30 ENCOUNTER — Ambulatory Visit: Payer: Medicare Other | Admitting: Cardiology

## 2022-06-30 ENCOUNTER — Encounter: Payer: Self-pay | Admitting: Cardiology

## 2022-06-30 VITALS — BP 131/70 | HR 79 | Ht 60.0 in | Wt 152.6 lb

## 2022-06-30 DIAGNOSIS — R002 Palpitations: Secondary | ICD-10-CM

## 2022-06-30 NOTE — Progress Notes (Signed)
Patient referred by Glendale Chard, MD for palpitations  Subjective:   Martha Pham, female    DOB: [birth year], 71 y.o.   MRN: [id number]   Chief Complaint  Patient presents with   Palpitations   Follow-up   Results    HPI  71 y.o. African American female with hypertension, hyperlipidemia, OSA on CPAP, h/o breast cancer, now with complaints of palpitations.  Reviewed recent test results with the patient, details below.  Palpitations symptoms have generally improved.  Initial consultation visit 04/2022: Patient reports palpitations symptoms lasting for several minutes, occurring at rest, unrelated to exertion. Symptoms occur infrequently, last about a month ago. On a separate note, patient has had h/o fatal brain aneurysm in her son, as well as h/o aneurysm of unknown site in her sister.    Current Outpatient Medications:    acetaminophen (TYLENOL) 500 MG tablet, Take 1,000 mg by mouth every 6 (six) hours as needed for moderate pain. , Disp: , Rfl:    Ascorbic Acid (VITAMIN C) 1000 MG tablet, Take 1,000 mg by mouth daily., Disp: , Rfl:    atorvastatin (LIPITOR) 40 MG tablet, Take 1 tablet (40 mg total) by mouth daily., Disp: 90 tablet, Rfl: 3   calcium carbonate (OS-CAL) 600 MG TABS, Take 600 mg by mouth 2 (two) times daily with a meal.  , Disp: , Rfl:    Carboxymethylcellulose Sodium (REFRESH OP), Apply 1 drop to eye 2 (two) times daily as needed. For dry eyes, Disp: , Rfl:    Cyanocobalamin (VITAMIN B-12) 2500 MCG SUBL, Place 1 tablet under the tongue daily., Disp: , Rfl:    diazepam (VALIUM) 2 MG tablet, , Disp: , Rfl:    gabapentin (NEURONTIN) 100 MG capsule, Take 1 pill at bedtime as needed for leg pain., Disp: 90 capsule, Rfl: 3   glucose blood (CONTOUR NEXT TEST) test strip, USE AS DIRECTED TO  CHECK  BLOOD  SUGARS  1  TIME  PER  DAY, Disp: 100 each, Rfl: 0   hydrochlorothiazide (HYDRODIURIL) 25 MG tablet, Take 1 tablet (25 mg total) by mouth daily., Disp: 90 tablet,  Rfl: 1   loratadine (CLARITIN) 10 MG tablet, Take 1 tablet (10 mg total) by mouth daily., Disp: 30 tablet, Rfl: 2   losartan (COZAAR) 100 MG tablet, Take 1 tablet (100 mg total) by mouth daily., Disp: 90 tablet, Rfl: 1   Magnesium 400 MG CAPS, Take by mouth daily. , Disp: , Rfl:    metoprolol succinate (TOPROL-XL) 25 MG 24 hr tablet, Take 1 tablet by mouth once daily, Disp: 90 tablet, Rfl: 0   Microlet Lancets MISC, USE AS DIRECTED TO  CHECK  BLOOD  SUGARS  1  TIME  PER  DAY, Disp: 100 each, Rfl: 0   Semaglutide,0.25 or 0.5MG/DOS, (OZEMPIC, 0.25 OR 0.5 MG/DOSE,) 2 MG/3ML SOPN, Inject 0.5 mg into the skin once a week., Disp: 9 mL, Rfl: 1   Vitamin D, Cholecalciferol, 50 MCG (2000 UT) CAPS, Take by mouth. , Disp: , Rfl:    Cardiovascular and other pertinent studies:  Reviewed external labs and tests, independently interpreted  Echocardiogram 05/30/2022: Evaluation limited due to absent apical views. Left ventricle cavity is normal in size. Mild concentric hypertrophy of the left ventricle. Normal global wall motion. Normal LVEF 55-60%. Diastolic function could not be assessed. Mild pulmonic regurgitation. Normal right atrial pressure. Unlike previous study in 2016, MR, TR not appreciated.  Mobile cardiac telemetry 14 days 05/12/2022 - 05/26/2022: Dominant rhythm: Sinus.  HR 44-126 bpm. Avg HR 67 bpm, in sinus rhythm. 1 episode of probable atrial tachycardia, at 135 bpm for 5 beats. <1% isolated SVE, couplet/triplets. 0 episode of VT. <1% isolated VE, couplets. Second degree type 1 AV block noted. No atrial fibrillation/atrial flutter/VT/high grade AV block, sinus pause >3sec noted. 0 patient triggered events.     EKG 05/12/2022: Sinus rhythm 66 bpm  Probable left atrial enlargement   Recent labs: 04/03/2022: Glucose 108, BUN/Cr 18/1.19. EGFR 48. Na/K 139/3.7.  HbA1C 5.5% TSH 0.9 normal  11/2021: Chol 180, TG 94, HDL 51, LDL 112  05/2021: H/H 11/34. MCV 93. Platelets  200    Review of Systems  Cardiovascular:  Negative for chest pain, dyspnea on exertion, leg swelling, palpitations and syncope.       Vitals:   06/30/22 1430  BP: 131/70  Pulse: 79  SpO2: 100%    Body mass index is 29.8 kg/m. Filed Weights   06/30/22 1430  Weight: 152 lb 9.6 oz (69.2 kg)     Objective:   Physical Exam Vitals and nursing note reviewed.  Constitutional:      General: She is not in acute distress. Neck:     Vascular: No JVD.  Cardiovascular:     Rate and Rhythm: Normal rate and regular rhythm.     Heart sounds: Normal heart sounds. No murmur heard. Pulmonary:     Effort: Pulmonary effort is normal.     Breath sounds: Normal breath sounds. No wheezing or rales.  Musculoskeletal:     Right lower leg: No edema.     Left lower leg: No edema.          Visit diagnoses:   ICD-10-CM   1. Palpitations  R00.2         Assessment & Recommendations:   71 y.o. African American female with hypertension, hyperlipidemia, OSA on CPAP, h/o breast cancer, now with complaints of palpitations.  Palpitations: Structurally normal heart. No significant arrhythmia.  In future, if she has increase in palpitation symptoms, could consider adding metoprolol tartrate.  F/u as needed   Nigel Mormon, MD Pager: [phone] Office: [phone]

## 2022-07-03 ENCOUNTER — Encounter: Payer: Self-pay | Admitting: Internal Medicine

## 2022-07-03 ENCOUNTER — Ambulatory Visit (INDEPENDENT_AMBULATORY_CARE_PROVIDER_SITE_OTHER): Payer: Medicare Other | Admitting: Internal Medicine

## 2022-07-03 VITALS — BP 122/70 | HR 67 | Temp 98.4°F | Ht 60.0 in | Wt 152.0 lb

## 2022-07-03 DIAGNOSIS — N1831 Chronic kidney disease, stage 3a: Secondary | ICD-10-CM

## 2022-07-03 DIAGNOSIS — Z6829 Body mass index (BMI) 29.0-29.9, adult: Secondary | ICD-10-CM

## 2022-07-03 DIAGNOSIS — Z23 Encounter for immunization: Secondary | ICD-10-CM

## 2022-07-03 DIAGNOSIS — E1122 Type 2 diabetes mellitus with diabetic chronic kidney disease: Secondary | ICD-10-CM

## 2022-07-03 DIAGNOSIS — I129 Hypertensive chronic kidney disease with stage 1 through stage 4 chronic kidney disease, or unspecified chronic kidney disease: Secondary | ICD-10-CM | POA: Diagnosis not present

## 2022-07-03 NOTE — Patient Instructions (Signed)
Hypertension, Adult ?Hypertension is another name for high blood pressure. High blood pressure forces your heart to work harder to pump blood. This can cause problems over time. ?There are two numbers in a blood pressure reading. There is a top number (systolic) over a bottom number (diastolic). It is best to have a blood pressure that is below 120/80. ?What are the causes? ?The cause of this condition is not known. Some other conditions can lead to high blood pressure. ?What increases the risk? ?Some lifestyle factors can make you more likely to develop high blood pressure: ?Smoking. ?Not getting enough exercise or physical activity. ?Being overweight. ?Having too much fat, sugar, calories, or salt (sodium) in your diet. ?Drinking too much alcohol. ?Other risk factors include: ?Having any of these conditions: ?Heart disease. ?Diabetes. ?High cholesterol. ?Kidney disease. ?Obstructive sleep apnea. ?Having a family history of high blood pressure and high cholesterol. ?Age. The risk increases with age. ?Stress. ?What are the signs or symptoms? ?High blood pressure may not cause symptoms. Very high blood pressure (hypertensive crisis) may cause: ?Headache. ?Fast or uneven heartbeats (palpitations). ?Shortness of breath. ?Nosebleed. ?Vomiting or feeling like you may vomit (nauseous). ?Changes in how you see. ?Very bad chest pain. ?Feeling dizzy. ?Seizures. ?How is this treated? ?This condition is treated by making healthy lifestyle changes, such as: ?Eating healthy foods. ?Exercising more. ?Drinking less alcohol. ?Your doctor may prescribe medicine if lifestyle changes do not help enough and if: ?Your top number is above 130. ?Your bottom number is above 80. ?Your personal target blood pressure may vary. ?Follow these instructions at home: ?Eating and drinking ? ?If told, follow the DASH eating plan. To follow this plan: ?Fill one half of your plate at each meal with fruits and vegetables. ?Fill one fourth of your plate  at each meal with whole grains. Whole grains include whole-wheat pasta, brown rice, and whole-grain bread. ?Eat or drink low-fat dairy products, such as skim milk or low-fat yogurt. ?Fill one fourth of your plate at each meal with low-fat (lean) proteins. Low-fat proteins include fish, chicken without skin, eggs, beans, and tofu. ?Avoid fatty meat, cured and processed meat, or chicken with skin. ?Avoid pre-made or processed food. ?Limit the amount of salt in your diet to less than 1,500 mg each day. ?Do not drink alcohol if: ?Your doctor tells you not to drink. ?You are pregnant, may be pregnant, or are planning to become pregnant. ?If you drink alcohol: ?Limit how much you have to: ?0-1 drink a day for women. ?0-2 drinks a day for men. ?Know how much alcohol is in your drink. In the [place] one drink equals one 12 oz bottle of beer (355 mL), one 5 oz glass of wine (148 mL), or one 1? oz glass of hard liquor (44 mL). ?Lifestyle ? ?Work with your doctor to stay at a healthy weight or to lose weight. Ask your doctor what the best weight is for you. ?Get at least 30 minutes of exercise that causes your heart to beat faster (aerobic exercise) most days of the week. This may include walking, swimming, or biking. ?Get at least 30 minutes of exercise that strengthens your muscles (resistance exercise) at least 3 days a week. This may include lifting weights or doing Pilates. ?Do not smoke or use any products that contain nicotine or tobacco. If you need help quitting, ask your doctor. ?Check your blood pressure at home as told by your doctor. ?Keep all follow-up visits. ?Medicines ?Take over-the-counter and prescription medicines   only as told by your doctor. Follow directions carefully. ?Do not skip doses of blood pressure medicine. The medicine does not work as well if you skip doses. Skipping doses also puts you at risk for problems. ?Ask your doctor about side effects or reactions to medicines that you should watch  for. ?Contact a doctor if: ?You think you are having a reaction to the medicine you are taking. ?You have headaches that keep coming back. ?You feel dizzy. ?You have swelling in your ankles. ?You have trouble with your vision. ?Get help right away if: ?You get a very bad headache. ?You start to feel mixed up (confused). ?You feel weak or numb. ?You feel faint. ?You have very bad pain in your: ?Chest. ?Belly (abdomen). ?You vomit more than once. ?You have trouble breathing. ?These symptoms may be an emergency. Get help right away. Call 911. ?Do not wait to see if the symptoms will go away. ?Do not drive yourself to the hospital. ?Summary ?Hypertension is another name for high blood pressure. ?High blood pressure forces your heart to work harder to pump blood. ?For most people, a normal blood pressure is less than 120/80. ?Making healthy choices can help lower blood pressure. If your blood pressure does not get lower with healthy choices, you may need to take medicine. ?This information is not intended to replace advice given to you by your health care provider. Make sure you discuss any questions you have with your health care provider. ?Document Revised: 04/25/2021 Document Reviewed: 04/25/2021 ?Elsevier Patient Education ? 2023 [employer] Inc[employer] ? ?

## 2022-07-03 NOTE — Progress Notes (Signed)
Martha Pham,acting as a Education administrator for Martha Greenland, MD.,have documented all relevant documentation on the behalf of Martha Greenland, MD,as directed by  Martha Greenland, MD while in the presence of Martha Greenland, MD.    Subjective:     Patient ID: Martha Pham , female    DOB: 12/31/1950 , 71 y.o.   MRN: [id number]   Chief Complaint  Patient presents with   Hypertension    HPI  Patient present for a bp and diabetes check. She reports compliance with meds. She denies headaches, chest pain and shortness of breath. She has no specific concerns at this time. She is happy to report she has been moving more.     Hypertension This is a chronic problem. The current episode started more than 1 year ago. The problem has been gradually improving since onset. The problem is controlled. Risk factors for coronary artery disease include diabetes mellitus, dyslipidemia, post-menopausal state, obesity and sedentary lifestyle. The current treatment provides moderate improvement. Compliance problems include exercise.   Diabetes She presents for her follow-up diabetic visit. She has type 2 diabetes mellitus. Her disease course has been improving. There are no hypoglycemic associated symptoms. Pertinent negatives for diabetes include no polydipsia, no polyphagia and no polyuria. There are no hypoglycemic complications.     Past Medical History:  Diagnosis Date   Apnea, sleep    does wear cpap   Arthritis    Cancer ([place])    lt. breast ca-snbx   Chronic kidney disease    Contact lens/glasses fitting    wears contacts or glasses   COPD (chronic obstructive pulmonary disease) (HCC)    brother   Heart murmur    Hypercholesteremia    Hyperlipemia    Hypertension    Lower back pain    Lung cancer ([place])    Malaise and fatigue    Personal history of chemotherapy 2013   Sleep apnea    SLEEP STUDY DX 10,CPAP BUT DON'TUSE   Vitamin D deficiency    Wears dentures    upper     Family  History  Problem Relation Age of Onset   Heart attack Mother    Brain cancer Father    Breast cancer Sister 7   Breast cancer Sister 101   Sleep apnea Neg Hx      Current Outpatient Medications:    acetaminophen (TYLENOL) 500 MG tablet, Take 1,000 mg by mouth every 6 (six) hours as needed for moderate pain. , Disp: , Rfl:    Ascorbic Acid (VITAMIN C) 1000 MG tablet, Take 1,000 mg by mouth daily., Disp: , Rfl:    atorvastatin (LIPITOR) 40 MG tablet, Take 1 tablet (40 mg total) by mouth daily., Disp: 90 tablet, Rfl: 3   calcium carbonate (OS-CAL) 600 MG TABS, Take 600 mg by mouth 2 (two) times daily with a meal.  , Disp: , Rfl:    Carboxymethylcellulose Sodium (REFRESH OP), Apply 1 drop to eye 2 (two) times daily as needed. For dry eyes, Disp: , Rfl:    Cyanocobalamin (VITAMIN B-12) 2500 MCG SUBL, Place 1 tablet under the tongue daily., Disp: , Rfl:    diazepam (VALIUM) 2 MG tablet, , Disp: , Rfl:    gabapentin (NEURONTIN) 100 MG capsule, Take 1 pill at bedtime as needed for leg pain., Disp: 90 capsule, Rfl: 3   glucose blood (CONTOUR NEXT TEST) test strip, USE AS DIRECTED TO  CHECK  BLOOD  SUGARS  1  TIME  PER  DAY, Disp: 100 each, Rfl: 0   hydrochlorothiazide (HYDRODIURIL) 25 MG tablet, Take 1 tablet (25 mg total) by mouth daily., Disp: 90 tablet, Rfl: 1   loratadine (CLARITIN) 10 MG tablet, Take 1 tablet (10 mg total) by mouth daily., Disp: 30 tablet, Rfl: 2   losartan (COZAAR) 100 MG tablet, Take 1 tablet (100 mg total) by mouth daily., Disp: 90 tablet, Rfl: 1   Magnesium 400 MG CAPS, Take by mouth daily. , Disp: , Rfl:    metoprolol succinate (TOPROL-XL) 25 MG 24 hr tablet, Take 1 tablet by mouth once daily, Disp: 90 tablet, Rfl: 0   Microlet Lancets MISC, USE AS DIRECTED TO  CHECK  BLOOD  SUGARS  1  TIME  PER  DAY, Disp: 100 each, Rfl: 0   Semaglutide,0.25 or 0.5MG/DOS, (OZEMPIC, 0.25 OR 0.5 MG/DOSE,) 2 MG/3ML SOPN, Inject 0.5 mg into the skin once a week., Disp: 9 mL, Rfl: 1   Vitamin  D, Cholecalciferol, 50 MCG (2000 UT) CAPS, Take by mouth. , Disp: , Rfl:    Allergies  Allergen Reactions   Dust Mite Extract Itching   Other     grass     Review of Systems  Constitutional: Negative.   Eyes: Negative.   Respiratory: Negative.    Cardiovascular: Negative.   Gastrointestinal: Negative.   Endocrine: Negative for polydipsia, polyphagia and polyuria.  Musculoskeletal: Negative.   Skin: Negative.   Neurological: Negative.   Psychiatric/Behavioral: Negative.       Today's Vitals   07/03/22 1158  BP: 122/70  Pulse: 67  Temp: 98.4 F (36.9 C)  Weight: 152 lb (68.9 kg)  Height: 5' (1.524 m)  PainSc: 0-No pain   Body mass index is 29.69 kg/m.  Wt Readings from Last 3 Encounters:  07/03/22 152 lb (68.9 kg)  06/30/22 152 lb 9.6 oz (69.2 kg)  06/25/22 150 lb 6.4 oz (68.2 kg)     Objective:  Physical Exam Vitals and nursing note reviewed.  Constitutional:      Appearance: Normal appearance.  HENT:     Head: Normocephalic and atraumatic.     Nose:     Comments: Masked     Mouth/Throat:     Comments: Masked Eyes:     Extraocular Movements: Extraocular movements intact.  Cardiovascular:     Rate and Rhythm: Normal rate and regular rhythm.     Heart sounds: Normal heart sounds.  Pulmonary:     Effort: Pulmonary effort is normal.     Breath sounds: Normal breath sounds.  Musculoskeletal:     Cervical back: Normal range of motion.  Skin:    General: Skin is warm.  Neurological:     General: No focal deficit present.     Mental Status: She is alert.  Psychiatric:        Mood and Affect: Mood normal.        Behavior: Behavior normal.      Assessment And Plan:     1. Parenchymal renal hypertension, stage 1 through stage 4 or unspecified chronic kidney disease Comments: Chronic, well controlled.  Encouraged to follow low sodium diet. Agrees to PREP referral. - Amb Referral To Provider Referral Exercise Program (P.R.E.P) - CMP14+EGFR - Lipid  panel  2. Type 2 diabetes mellitus with stage 3a chronic kidney disease, without long-term current use of insulin ([place]) Comments: Chronic, unfortunately she is not scheduled unti Feb 2024 for her eye exam. She was due Oct 2023.  She  will rto in March 2024 for her next CPE. - Amb Referral To Provider Referral Exercise Program (P.R.E.P) - CMP14+EGFR - Hemoglobin A1c - Protein electrophoresis, serum - Parathyroid Hormone, Intact w/Ca - Phosphorus - Lipid panel  3. BMI 29.0-29.9,adult Comments: She was congratulated on her 19 lbs weight loss over the past 6 months. She is encouraged to aim for at least 150 minutes of exercise per week. - Amb Referral To Provider Referral Exercise Program (P.R.E.P)  4. Immunization due Comments: She was given Tdap, billed via [facility]. - Tdap vaccine greater than or equal to 7yo IM   Patient was given opportunity to ask questions. Patient verbalized understanding of the plan and was able to repeat key elements of the plan. All questions were answered to their satisfaction.   I, Martha Greenland, MD, have reviewed all documentation for this visit. The documentation on 07/03/22 for the exam, diagnosis, procedures, and orders are all accurate and complete.   IF YOU HAVE BEEN REFERRED TO A SPECIALIST, IT MAY TAKE 1-2 WEEKS TO SCHEDULE/PROCESS THE REFERRAL. IF YOU HAVE NOT HEARD FROM US/SPECIALIST IN TWO WEEKS, PLEASE GIVE [place] A CALL AT [phone].   THE PATIENT IS ENCOURAGED TO PRACTICE SOCIAL DISTANCING DUE TO THE COVID-19 PANDEMIC.

## 2022-07-04 ENCOUNTER — Telehealth: Payer: Self-pay

## 2022-07-04 ENCOUNTER — Telehealth: Payer: Self-pay | Admitting: *Deleted

## 2022-07-04 NOTE — Telephone Encounter (Signed)
Call to pt reference PREP referral  Explained program to pt Confirmed Martha Pham is closest Would like to do program and T/TH 1pm would work. Tentatively scheduled for 08/12/22 start. Will call her back to schedule intake

## 2022-07-07 LAB — CMP14+EGFR
ALT: 14 IU/L (ref 0–32)
AST: 13 IU/L (ref 0–40)
Albumin/Globulin Ratio: 1.5 (ref 1.2–2.2)
Albumin: 4.3 g/dL (ref 3.8–4.8)
Alkaline Phosphatase: 54 IU/L (ref 44–121)
BUN/Creatinine Ratio: 18 (ref 12–28)
BUN: 20 mg/dL (ref 8–27)
Bilirubin Total: 0.5 mg/dL (ref 0.0–1.2)
CO2: 22 mmol/L (ref 20–29)
Calcium: 9.7 mg/dL (ref 8.7–10.3)
Chloride: 106 mmol/L (ref 96–106)
Creatinine, Ser: 1.09 mg/dL — ABNORMAL HIGH (ref 0.57–1.00)
Globulin, Total: 2.8 g/dL (ref 1.5–4.5)
Glucose: 95 mg/dL (ref 70–99)
Potassium: 3.8 mmol/L (ref 3.5–5.2)
Sodium: 142 mmol/L (ref 134–144)
Total Protein: 7.1 g/dL (ref 6.0–8.5)
eGFR: 54 mL/min/{1.73_m2} — ABNORMAL LOW (ref >59)

## 2022-07-07 LAB — PROTEIN ELECTROPHORESIS, SERUM
A/G Ratio: 1 (ref 0.7–1.7)
Albumin ELP: 3.6 g/dL (ref 2.9–4.4)
Alpha 1: 0.2 g/dL (ref 0.0–0.4)
Alpha 2: 0.7 g/dL (ref 0.4–1.0)
Beta: 1.1 g/dL (ref 0.7–1.3)
Gamma Globulin: 1.5 g/dL (ref 0.4–1.8)
Globulin, Total: 3.5 g/dL (ref 2.2–3.9)

## 2022-07-07 LAB — LIPID PANEL
Chol/HDL Ratio: 2.8 ratio (ref 0.0–4.4)
Cholesterol, Total: 177 mg/dL (ref 100–199)
HDL: 63 mg/dL (ref >39)
LDL Chol Calc (NIH): 103 mg/dL — ABNORMAL HIGH (ref 0–99)
Triglycerides: 58 mg/dL (ref 0–149)
VLDL Cholesterol Cal: 11 mg/dL (ref 5–40)

## 2022-07-07 LAB — PHOSPHORUS: Phosphorus: 3 mg/dL (ref 3.0–4.3)

## 2022-07-07 LAB — HEMOGLOBIN A1C
Est. average glucose Bld gHb Est-mCnc: 117 mg/dL
Hgb A1c MFr Bld: 5.7 % — ABNORMAL HIGH (ref 4.8–5.6)

## 2022-07-07 LAB — PTH, INTACT AND CALCIUM: PTH: 37 pg/mL (ref 15–65)

## 2022-07-09 ENCOUNTER — Encounter: Payer: PPO | Admitting: Internal Medicine

## 2022-07-09 ENCOUNTER — Other Ambulatory Visit: Payer: Self-pay | Admitting: Internal Medicine

## 2022-07-13 ENCOUNTER — Other Ambulatory Visit: Payer: Self-pay | Admitting: Internal Medicine

## 2022-07-23 ENCOUNTER — Encounter: Payer: Self-pay | Admitting: Internal Medicine

## 2022-07-29 DIAGNOSIS — H35033 Hypertensive retinopathy, bilateral: Secondary | ICD-10-CM | POA: Diagnosis not present

## 2022-07-29 DIAGNOSIS — I10 Essential (primary) hypertension: Secondary | ICD-10-CM | POA: Diagnosis not present

## 2022-07-29 DIAGNOSIS — E119 Type 2 diabetes mellitus without complications: Secondary | ICD-10-CM | POA: Diagnosis not present

## 2022-07-29 DIAGNOSIS — Z961 Presence of intraocular lens: Secondary | ICD-10-CM | POA: Diagnosis not present

## 2022-07-29 LAB — HM DIABETES EYE EXAM

## 2022-08-06 ENCOUNTER — Encounter: Payer: Self-pay | Admitting: Internal Medicine

## 2022-08-08 ENCOUNTER — Telehealth: Payer: Self-pay

## 2022-08-09 ENCOUNTER — Other Ambulatory Visit: Payer: Self-pay | Admitting: Internal Medicine

## 2022-08-09 DIAGNOSIS — N1831 Chronic kidney disease, stage 3a: Secondary | ICD-10-CM

## 2022-08-09 DIAGNOSIS — E7800 Pure hypercholesterolemia, unspecified: Secondary | ICD-10-CM

## 2022-08-14 ENCOUNTER — Telehealth: Payer: Self-pay

## 2022-08-14 NOTE — Progress Notes (Signed)
   Care Guide Note  08/14/2022 Name: BERDINA CHEEVER MRN: [id number] DOB: [birth year]  Referred by: Glendale Chard, MD Reason for referral : No chief complaint on file.   SERRENA LINDERMAN is a 72 y.o. year old female who is a primary care patient of Glendale Chard, [place] was referred to the pharmacist for assistance related to DM.    Successful contact was made with the patient to discuss pharmacy services including being ready for the pharmacist to call at least 5 minutes before the scheduled appointment time, to have medication bottles and any blood sugar or blood pressure readings ready for review. The patient agreed to meet with the pharmacist via with the pharmacist via telephone visit on (date/time).  09/09/2022  Noreene Larsson, [place], [place] [address] Direct Dial: [phone] [email or web address]

## 2022-08-14 NOTE — Progress Notes (Signed)
  Chronic Care Management   Note  08/14/2022 Name: RONIYA TETRO MRN: [id number] DOB: 09-30-1950  HALINA ASANO is a 72 y.o. year old female who is a primary care patient of Glendale Chard, MD. I reached out to Larae Grooms by phone today in response to a referral sent by Ms. Cheron Schaumann Bustamante's PCP.  Ms. Benefiel was given information about Chronic Care Management services today including:  CCM service includes personalized support from designated clinical staff supervised by the physician, including individualized plan of care and coordination with other care providers 24/7 contact phone numbers for assistance for urgent and routine care needs. Service will only be billed when office clinical staff spend 20 minutes or more in a month to coordinate care. Only one practitioner may furnish and bill the service in a calendar month. The patient may stop CCM services at amy time (effective at the end of the month) by phone call to the office staff. The patient will be responsible for cost sharing (co-pay) or up to 20% of the service fee (after annual deductible is met)  Ms. Larae Grooms  agreedto scheduling an appointment with the CCM RN Case Manager   Follow up plan: Patient agreed to scheduled appointment with RN Case Manager on 09/02/2022(date/time).   Noreene Larsson, [place], [place] [address] Direct Dial: [phone] [email or web address]

## 2022-08-17 ENCOUNTER — Other Ambulatory Visit: Payer: Self-pay | Admitting: Internal Medicine

## 2022-08-18 ENCOUNTER — Telehealth: Payer: Self-pay

## 2022-08-18 NOTE — Progress Notes (Cosign Needed)
ERROR

## 2022-08-22 ENCOUNTER — Telehealth: Payer: Self-pay

## 2022-08-22 ENCOUNTER — Other Ambulatory Visit: Payer: Self-pay | Admitting: Internal Medicine

## 2022-08-22 DIAGNOSIS — Z1231 Encounter for screening mammogram for malignant neoplasm of breast: Secondary | ICD-10-CM

## 2022-08-22 NOTE — Telephone Encounter (Signed)
Call to pt to offer next PREP class starting on 09/01/22 230p-345p MW.  Verified can do that schedule Intake scheduled for 08/27/22 at 315pm

## 2022-08-22 NOTE — Telephone Encounter (Signed)
Faxed 2024 re-enrollment application to [employer] for Ozempic 0.5 mg. Per Dellia Cloud. YL,RMA

## 2022-08-26 NOTE — Progress Notes (Signed)
[facility] PREP Evaluation  Patient Details  Name: Martha Pham MRN: [id number] Date of Birth: May 08, 1951 Age: 72 y.o. PCP: Glendale Chard, MD  Vitals:   08/26/22 1703  BP: 138/70  Pulse: 76  SpO2: 99%  Weight: 148 lb 3.2 oz (67.2 kg)     [facility] Eval - 08/26/22 1700       [facility] "PREP" Location   [facility] "PREP" Location [facility]      Referral    Referring Provider [facility]    Reason for referral Hypertension;High Cholesterol   Pre diabetes   Program Start Date 09/01/22   MW 230p-345p     Measurement   Waist Circumference 36 inches    Hip Circumference 38 inches    Body fat 45.8 percent      Information for Trainer   Goals Get stronger, get off meds for chol and diabetes    Current Exercise chair yoga 2x per week    Orthopedic Concerns right hip/groin pain, back pain    Pertinent Medical History Breast cancer hx, prediabetes, HTN, inc chol    Current Barriers none    Restrictions/Precautions Diabetic snack before exercise    Medications that affect exercise Medication causing dizziness/drowsiness      Timed Up and Go (TUGS)   Timed Up and Go Low risk <9 seconds      Mobility and Daily Activities   I find it easy to walk up or down two or more flights of stairs. 2    I have no trouble taking out the trash. 4    I do housework such as vacuuming and dusting on my own without difficulty. 4    I can easily lift a gallon of milk (8lbs). 4    I can easily walk a mile. 2    I have no trouble reaching into high cupboards or reaching down to pick up something from the floor. 1    I do not have trouble doing out-door work such as Armed forces logistics/support/administrative officer, raking leaves, or gardening. 1      Mobility and Daily Activities   I feel younger than my age. 2    I feel independent. 4    I feel energetic. 1    I live an active life.  3    I feel strong. 3    I feel healthy. 3    I feel active as other people my age. 3      How fit and strong are you.   Fit and Strong Total Score 37             Past Medical History:  Diagnosis Date   Apnea, sleep    does wear cpap   Arthritis    Cancer ([place])    lt. breast ca-snbx   Chronic kidney disease    Contact lens/glasses fitting    wears contacts or glasses   COPD (chronic obstructive pulmonary disease) ([place])    brother   Heart murmur    Hypercholesteremia    Hyperlipemia    Hypertension    Lower back pain    Lung cancer ([place])    Malaise and fatigue    Personal history of chemotherapy 2013   Sleep apnea    SLEEP STUDY DX 10,CPAP BUT DON'TUSE   Vitamin D deficiency    Wears dentures    upper   Past Surgical History:  Procedure Laterality Date   BREAST BIOPSY Left 05/26/2011   malignant   BREAST  BIOPSY Right 10/29/2016   BREAST IMPLANT EXCHANGE Left 09/23/2012   Procedure: REMOVAL OF LEFT EXPANDER/PLACEMENT OF IMPLANT WITH REDUCTION OF RIGHT BREAST;  Surgeon: Theodoro Kos, DO;  Location: [facility];  Service: Plastics;  Laterality: Left;   BREAST LUMPECTOMY W/ NEEDLE LOCALIZATION  06/24/2011   left   BREAST REDUCTION SURGERY Right 09/23/2012   Procedure: MAMMARY REDUCTION  (BREAST);  Surgeon: Theodoro Kos, DO;  Location: [facility];  Service: Plastics;  Laterality: Right;   BREAST SURGERY  08/07/2011   re-exc. left breast margins   CATARACT EXTRACTION Bilateral    january and february 2022   CYST REMOVAL NECK  08/2013   Dr. Migdalia Dk   LIPOSUCTION Bilateral 09/23/2012   Procedure: LIPOSUCTION;  Surgeon: Theodoro Kos, DO;  Location: [facility];  Service: Plastics;  Laterality: Bilateral;   MASTECTOMY Left 2012   MOUTH SURGERY     TOOTH IMPLANT BOTTOM X2 LFT   PORT-A-CATH REMOVAL Right 09/23/2012   Procedure: REMOVAL PORT-A-CATH;  Surgeon: Theodoro Kos, DO;  Location: [facility];  Service: Plastics;  Laterality: Right;   PORTACATH PLACEMENT  06/24/2011   Procedure: INSERTION PORT-A-CATH;  Surgeon: Judieth Keens, DO;  Location: [facility];  Service: General;  Laterality: Right;  Right mediport placement   RECONSTRUCTION BREAST IMMEDIATE / DELAYED W/ TISSUE EXPANDER  04/2012   left   REDUCTION MAMMAPLASTY Right 2013   SIMPLE MASTECTOMY  09/01/2011   left   TONSILLECTOMY     tooth implant removed     10/2021   Social History   Tobacco Use  Smoking Status Never  Smokeless Tobacco Never    Pam Tally Joe 08/26/2022, 5:10 PM

## 2022-08-27 ENCOUNTER — Telehealth: Payer: Self-pay

## 2022-08-27 NOTE — Telephone Encounter (Signed)
Refaxed 2024 re-enrollment application to [employer], corrected income section.

## 2022-09-02 ENCOUNTER — Telehealth: Payer: Self-pay | Admitting: *Deleted

## 2022-09-02 ENCOUNTER — Ambulatory Visit (INDEPENDENT_AMBULATORY_CARE_PROVIDER_SITE_OTHER): Payer: Medicare Other | Admitting: *Deleted

## 2022-09-02 DIAGNOSIS — E1122 Type 2 diabetes mellitus with diabetic chronic kidney disease: Secondary | ICD-10-CM

## 2022-09-02 DIAGNOSIS — I10 Essential (primary) hypertension: Secondary | ICD-10-CM

## 2022-09-02 NOTE — Telephone Encounter (Signed)
   CCM RN Visit Note   09/02/22 Name: Martha Pham MRN: [id number]      DOB: 1951/04/23  Subjective: Martha Pham is a 72 y.o. year old female who is a primary care patient of Glendale Chard MD The patient was referred to the Chronic Care Management team for assistance with care management needs subsequent to provider initiation of CCM services and plan of care.      An unsuccessful telephone outreach was attempted today to contact the patient about Chronic Care Management needs.    Plan:Telephone follow up appointment with care management team member scheduled for:  upon care guide rescheduling.  Jacqlyn Larsen [facility], BSN RN Case Manager [facility] [phone]

## 2022-09-02 NOTE — Plan of Care (Signed)
Chronic Care Management Provider Comprehensive Care Plan    09/02/2022 Name: Martha Pham MRN: [id number] DOB: Nov 29, 1950  Referral to Chronic Care Management (CCM) services was placed by Provider:  Glendale Chard MD on Date: 08/09/22.  Chronic Condition 1: DIABETES Provider Assessment and Plan  Type 2 diabetes mellitus with stage 3a chronic kidney disease, without long-term current use of insulin ([place]) Comments: Chronic, unfortunately she is not scheduled unti Feb 2024 for her eye exam. She was due Oct 2023.  She will rto in March 2024 for her next CPE. - Amb Referral To Provider Referral Exercise Program (P.R.E.P) - CMP14+EGFR - Hemoglobin A1c - Protein electrophoresis, serum - Parathyroid Hormone, Intact w/Ca - Phosphorus - Lipid panel  Expected Outcome/Goals Addressed This Visit (Provider CCM goals/Provider Assessment and plan  CCM (DIABETES) EXPECTED OUTCOME: MONITOR, SELF-MANAGE AND REDUCE SYMPTOMS OF DIABETES  Symptom Management Condition 1: Take medications as prescribed   Attend all scheduled provider appointments Call pharmacy for medication refills 3-7 days in advance of running out of medications Attend church or other social activities Perform all self care activities independently  Perform IADL's (shopping, preparing meals, housekeeping, managing finances) independently Call provider office for new concerns or questions  check blood sugar at prescribed times: once daily check feet daily for cuts, sores or redness enter blood sugar readings and medication or insulin into daily log take the blood sugar log to all doctor visits take the blood sugar meter to all doctor visits trim toenails straight across fill half of plate with vegetables limit fast food meals to no more than 1 per week manage portion size read food labels for fat, fiber, carbohydrates and portion size keep feet up while sitting wash and dry feet carefully every day Look over education via my  chart- hypoglycemia  Chronic Condition 2: HYPERTENSION Provider Assessment and Plan Parenchymal renal hypertension, stage 1 through stage 4 or unspecified chronic kidney disease Comments: Chronic, well controlled.  Encouraged to follow low sodium diet. Agrees to PREP referral. - Amb Referral To Provider Referral Exercise Program (P.R.E.P) - CMP14+EGFR - Lipid panel Hypertension This is a chronic problem. The current episode started more than 1 year ago. The problem has been gradually improving since onset. The problem is controlled. Risk factors for coronary artery disease include diabetes mellitus, dyslipidemia, post-menopausal state, obesity and sedentary lifestyle. The current treatment provides moderate improvement. Compliance problems include exercise.      Expected Outcome/Goals Addressed This Visit (Provider CCM goals/Provider Assessment and plan  CCM (HYPERTENSION) EXPECTED OUTCOME: MONITOR, SELF-MANAGE AND REDUCE SYMPTOMS OF HYPERTENSION  Symptom Management Condition 2: Take medications as prescribed   Attend all scheduled provider appointments Call pharmacy for medication refills 3-7 days in advance of running out of medications Attend church or other social activities Perform all self care activities independently  Perform IADL's (shopping, preparing meals, housekeeping, managing finances) independently Call provider office for new concerns or questions  check blood pressure 3 times per week choose a place to take my blood pressure (home, clinic or office, retail store) write blood pressure results in a log or diary learn about high blood pressure keep a blood pressure log take blood pressure log to all doctor appointments call doctor for signs and symptoms of high blood pressure keep all doctor appointments take medications for blood pressure exactly as prescribed report new symptoms to your doctor eat more whole grains, fruits and vegetables, lean meats and healthy  fats Advanced directives mailed Look over education sent via my chart-  hypoglycemia  Problem List Patient Active Problem List   Diagnosis Date Noted   Pure hypercholesterolemia 04/06/2022   Palpitations 04/06/2022   Personal history of COVID-19 04/06/2022   Mixed hyperlipidemia 06/06/2021   Class 1 obesity due to excess calories with serious comorbidity and body mass index (BMI) of 33.0 to 33.9 in adult 06/06/2021   Keloid 06/06/2021   History of breast cancer 11/23/2020   Diabetes mellitus with stage 2 chronic kidney disease ([place]) 06/22/2018   Chronic renal disease, stage II 06/22/2018   Parenchymal renal hypertension 06/22/2018   Adult BMI 29.0-29.9 kg/sq m 06/22/2018   Pain in joint, pelvic region and thigh 03/28/2014   Acquired absence of breast and absent nipple 05/05/2012    Medication Management  Current Outpatient Medications:    acetaminophen (TYLENOL) 500 MG tablet, Take 1,000 mg by mouth every 6 (six) hours as needed for moderate pain. , Disp: , Rfl:    Ascorbic Acid (VITAMIN C) 1000 MG tablet, Take 1,000 mg by mouth daily., Disp: , Rfl:    atorvastatin (LIPITOR) 40 MG tablet, Take 1 tablet (40 mg total) by mouth daily., Disp: 90 tablet, Rfl: 3   calcium carbonate (OS-CAL) 600 MG TABS, Take 600 mg by mouth 2 (two) times daily with a meal.  , Disp: , Rfl:    Carboxymethylcellulose Sodium (REFRESH OP), Apply 1 drop to eye 2 (two) times daily as needed. For dry eyes, Disp: , Rfl:    Cyanocobalamin (VITAMIN B-12) 2500 MCG SUBL, Place 1 tablet under the tongue daily., Disp: , Rfl:    diazepam (VALIUM) 2 MG tablet, , Disp: , Rfl:    gabapentin (NEURONTIN) 100 MG capsule, Take 1 pill at bedtime as needed for leg pain., Disp: 90 capsule, Rfl: 3   glucose blood (CONTOUR NEXT TEST) test strip, USE AS DIRECTED TO  CHECK  BLOOD  SUGARS  1  TIME  PER  DAY, Disp: 100 each, Rfl: 0   hydrochlorothiazide (HYDRODIURIL) 25 MG tablet, Take 1 tablet by mouth once daily, Disp: 90 tablet, Rfl:  0   losartan (COZAAR) 100 MG tablet, Take 1 tablet by mouth once daily, Disp: 90 tablet, Rfl: 0   Magnesium 400 MG CAPS, Take by mouth daily. , Disp: , Rfl:    metoprolol succinate (TOPROL-XL) 25 MG 24 hr tablet, Take 1 tablet by mouth once daily, Disp: 90 tablet, Rfl: 0   Microlet Lancets MISC, USE AS DIRECTED TO  CHECK  BLOOD  SUGARS  1  TIME  PER  DAY, Disp: 100 each, Rfl: 0   Semaglutide,0.25 or 0.5MG /DOS, (OZEMPIC, 0.25 OR 0.5 MG/DOSE,) 2 MG/3ML SOPN, Inject 0.5 mg into the skin once a week., Disp: 9 mL, Rfl: 1   Vitamin D, Cholecalciferol, 50 MCG (2000 UT) CAPS, Take by mouth. , Disp: , Rfl:    loratadine (CLARITIN) 10 MG tablet, Take 1 tablet (10 mg total) by mouth daily., Disp: 30 tablet, Rfl: 2  Cognitive Assessment Identity Confirmed: : Name; DOB Cognitive Status: Normal   Functional Assessment Hearing Difficulty or Deaf: no Wear Glasses or Blind: yes Vision Management: reading glasses Concentrating, Remembering or Making Decisions Difficulty (CP): no Difficulty Communicating: no Difficulty Eating/Swallowing: no Walking or Climbing Stairs Difficulty: no Dressing/Bathing Difficulty: no Doing Errands Independently Difficulty (such as shopping) (CP): no   Caregiver Assessment  Primary Source of Support/Comfort: -- (granddaughter) Name of Support/Comfort Primary Source: granddaughter People in Home: grandparent(s)   Planned Interventions  Evaluation of current treatment plan related to hypertension self  management and patient's adherence to plan as established by provider;   Reviewed prescribed diet low sodium Reviewed medications with patient and discussed importance of compliance;  Counseled on the importance of exercise goals with target of 150 minutes per week Discussed plans with patient for ongoing care management follow up and provided patient with direct contact information for care management team; Advised patient, providing education and rationale, to monitor  blood pressure daily and record, calling PCP for findings outside established parameters;  Advised patient to discuss any issues with blood pressure, medications with provider; Provided education on prescribed diet low sodium;  Discussed complications of poorly controlled blood pressure such as heart disease, stroke, circulatory complications, vision complications, kidney impairment, sexual dysfunction;  Screening for signs and symptoms of depression related to chronic disease state;  Assessed social determinant of health barriers;  Advanced directives mailed  Provided education to patient about basic DM disease process; Reviewed medications with patient and discussed importance of medication adherence;        Reviewed prescribed diet with patient carbohydrate modified; Counseled on importance of regular laboratory monitoring as prescribed;        Discussed plans with patient for ongoing care management follow up and provided patient with direct contact information for care management team;      Provided patient with written educational materials related to hypo and hyperglycemia and importance of correct treatment;       Advised patient, providing education and rationale, to check cbg once daily and record        call provider for findings outside established parameters;       Screening for signs and symptoms of depression related to chronic disease state;        Assessed social determinant of health barriers;         Interaction and coordination with outside resources, practitioners, and providers See CCM Referral  Care Plan: Available in MyChart

## 2022-09-02 NOTE — Chronic Care Management (AMB) (Signed)
Chronic Care Management   CCM RN Visit Note  09/02/2022 Name: Martha Pham MRN: [id number] DOB: February 03, 1951  Subjective: Martha Pham is a 72 y.o. year old female who is a primary care patient of Glendale Chard, MD. The patient was referred to the Chronic Care Management team for assistance with care management needs subsequent to provider initiation of CCM services and plan of care.    Today's Visit:  Engaged with patient by telephone for initial visit.     SDOH Interventions Today    Flowsheet Row Most Recent Value  SDOH Interventions   Food Insecurity Interventions Intervention Not Indicated  Housing Interventions Intervention Not Indicated  Transportation Interventions Intervention Not Indicated  Utilities Interventions Intervention Not Indicated  Financial Strain Interventions Intervention Not Indicated  Physical Activity Interventions Intervention Not Indicated  Stress Interventions Intervention Not Indicated  Social Connections Interventions Intervention Not Indicated         Goals Addressed             This Visit's Progress    CCM (DIABETES) EXPECTED OUTCOME: MONITOR, SELF-MANAGE AND REDUCE SYMPTOMS OF DIABETES       Current Barriers:  Knowledge Deficits related to Diabetes management Chronic Disease Management support and education needs related to Diabetes, diet No Advanced Directives in place- documents to be mailed Patient reports she checks blood sugar once daily with readings 107-117 fasting Patient reports she tries to follow carbohydrate modified diet, exercises at [facility] 2 days per week and will be going to another exercise class recommended by primary care provider at [facility]  Planned Interventions: Provided education to patient about basic DM disease process; Reviewed medications with patient and discussed importance of medication adherence;        Reviewed prescribed diet with patient carbohydrate modified; Counseled on importance of regular  laboratory monitoring as prescribed;        Discussed plans with patient for ongoing care management follow up and provided patient with direct contact information for care management team;      Provided patient with written educational materials related to hypo and hyperglycemia and importance of correct treatment;       Advised patient, providing education and rationale, to check cbg once daily and record        call provider for findings outside established parameters;       Screening for signs and symptoms of depression related to chronic disease state;        Assessed social determinant of health barriers;         Symptom Management: Take medications as prescribed   Attend all scheduled provider appointments Call pharmacy for medication refills 3-7 days in advance of running out of medications Attend church or other social activities Perform all self care activities independently  Perform IADL's (shopping, preparing meals, housekeeping, managing finances) independently Call provider office for new concerns or questions  check blood sugar at prescribed times: once daily check feet daily for cuts, sores or redness enter blood sugar readings and medication or insulin into daily log take the blood sugar log to all doctor visits take the blood sugar meter to all doctor visits trim toenails straight across fill half of plate with vegetables limit fast food meals to no more than 1 per week manage portion size read food labels for fat, fiber, carbohydrates and portion size keep feet up while sitting wash and dry feet carefully every day Look over education via my chart- hypoglycemia  Follow Up Plan: Telephone follow  up appointment with care management team member scheduled for:  11/11/22 at 3 pm       CCM (HYPERTENSION) EXPECTED OUTCOME: MONITOR, SELF-MANAGE AND REDUCE SYMPTOMS OF HYPERTENSION       Current Barriers:  Knowledge Deficits related to Hypertension management Chronic  Disease Management support and education needs related to Hypertension, diet, exercise No Advanced Directives in place- pt requests information be mailed Patient reports she lives alone, granddaughter comes by and stays at times and checks in on pt,  pt states she can call on her daughter if needed Patient reports she is independent in all aspects of her care, continues to drive, checks blood pressure several times per week  Planned Interventions: Evaluation of current treatment plan related to hypertension self management and patient's adherence to plan as established by provider;   Reviewed prescribed diet low sodium Reviewed medications with patient and discussed importance of compliance;  Counseled on the importance of exercise goals with target of 150 minutes per week Discussed plans with patient for ongoing care management follow up and provided patient with direct contact information for care management team; Advised patient, providing education and rationale, to monitor blood pressure daily and record, calling PCP for findings outside established parameters;  Advised patient to discuss any issues with blood pressure, medications with provider; Provided education on prescribed diet low sodium;  Discussed complications of poorly controlled blood pressure such as heart disease, stroke, circulatory complications, vision complications, kidney impairment, sexual dysfunction;  Screening for signs and symptoms of depression related to chronic disease state;  Assessed social determinant of health barriers;  Advanced directives mailed   Symptom Management: Take medications as prescribed   Attend all scheduled provider appointments Call pharmacy for medication refills 3-7 days in advance of running out of medications Attend church or other social activities Perform all self care activities independently  Perform IADL's (shopping, preparing meals, housekeeping, managing finances)  independently Call provider office for new concerns or questions  check blood pressure 3 times per week choose a place to take my blood pressure (home, clinic or office, retail store) write blood pressure results in a log or diary learn about high blood pressure keep a blood pressure log take blood pressure log to all doctor appointments call doctor for signs and symptoms of high blood pressure keep all doctor appointments take medications for blood pressure exactly as prescribed report new symptoms to your doctor eat more whole grains, fruits and vegetables, lean meats and healthy fats Advanced directives mailed Look over education sent via my chart- hypoglycemia  Follow Up Plan: Telephone follow up appointment with care management team member scheduled for:  11/11/22 at 3 pm          Plan:Telephone follow up appointment with care management team member scheduled for:  11/11/22 at 3 pm  Jacqlyn Larsen [facility], BSN RN Case Manager [facility] [phone]

## 2022-09-02 NOTE — Patient Instructions (Signed)
Please call the care guide team at [phone] if you need to cancel or reschedule your appointment.   If you are experiencing a Mental Health or [place] or need someone to talk to, please call the Suicide and Crisis Lifeline: 988 call the [place] National Suicide Prevention Lifeline: (947)745-2768 or TTY: 330 400 3493 TTY 208-114-1869) to talk to a trained counselor call 1-800-273-TALK (toll free, 24 hour hotline) go to [facility] [address] [place] [phone]) call 911   Following is a copy of the CCM Program Consent:  CCM service includes personalized support from designated clinical staff supervised by the physician, including individualized plan of care and coordination with other care providers 24/7 contact phone numbers for assistance for urgent and routine care needs. Service will only be billed when office clinical staff spend 20 minutes or more in a month to coordinate care. Only one practitioner may furnish and bill the service in a calendar month. The patient may stop CCM services at amy time (effective at the end of the month) by phone call to the office staff. The patient will be responsible for cost sharing (co-pay) or up to 20% of the service fee (after annual deductible is met)  Following is a copy of your full provider care plan:   Goals Addressed             This Visit's Progress    CCM (DIABETES) EXPECTED OUTCOME: MONITOR, SELF-MANAGE AND REDUCE SYMPTOMS OF DIABETES       Current Barriers:  Knowledge Deficits related to Diabetes management Chronic Disease Management support and education needs related to Diabetes, diet No Advanced Directives in place- documents to be mailed Patient reports she checks blood sugar once daily with readings 107-117 fasting Patient reports she tries to follow carbohydrate modified diet, exercises at [facility] 2 days per week and will be going to another exercise class recommended  by primary care provider at [facility]  Planned Interventions: Provided education to patient about basic DM disease process; Reviewed medications with patient and discussed importance of medication adherence;        Reviewed prescribed diet with patient carbohydrate modified; Counseled on importance of regular laboratory monitoring as prescribed;        Discussed plans with patient for ongoing care management follow up and provided patient with direct contact information for care management team;      Provided patient with written educational materials related to hypo and hyperglycemia and importance of correct treatment;       Advised patient, providing education and rationale, to check cbg once daily and record        call provider for findings outside established parameters;       Screening for signs and symptoms of depression related to chronic disease state;        Assessed social determinant of health barriers;         Symptom Management: Take medications as prescribed   Attend all scheduled provider appointments Call pharmacy for medication refills 3-7 days in advance of running out of medications Attend church or other social activities Perform all self care activities independently  Perform IADL's (shopping, preparing meals, housekeeping, managing finances) independently Call provider office for new concerns or questions  check blood sugar at prescribed times: once daily check feet daily for cuts, sores or redness enter blood sugar readings and medication or insulin into daily log take the blood sugar log to all doctor visits take the blood sugar meter  to all doctor visits trim toenails straight across fill half of plate with vegetables limit fast food meals to no more than 1 per week manage portion size read food labels for fat, fiber, carbohydrates and portion size keep feet up while sitting wash and dry feet carefully every day Look over education via my chart-  hypoglycemia  Follow Up Plan: Telephone follow up appointment with care management team member scheduled for:  11/11/22 at 3 pm       CCM (HYPERTENSION) EXPECTED OUTCOME: MONITOR, SELF-MANAGE AND REDUCE SYMPTOMS OF HYPERTENSION       Current Barriers:  Knowledge Deficits related to Hypertension management Chronic Disease Management support and education needs related to Hypertension, diet, exercise No Advanced Directives in place- pt requests information be mailed Patient reports she lives alone, granddaughter comes by and stays at times and checks in on pt,  pt states she can call on her daughter if needed Patient reports she is independent in all aspects of her care, continues to drive, checks blood pressure several times per week  Planned Interventions: Evaluation of current treatment plan related to hypertension self management and patient's adherence to plan as established by provider;   Reviewed prescribed diet low sodium Reviewed medications with patient and discussed importance of compliance;  Counseled on the importance of exercise goals with target of 150 minutes per week Discussed plans with patient for ongoing care management follow up and provided patient with direct contact information for care management team; Advised patient, providing education and rationale, to monitor blood pressure daily and record, calling PCP for findings outside established parameters;  Advised patient to discuss any issues with blood pressure, medications with provider; Provided education on prescribed diet low sodium;  Discussed complications of poorly controlled blood pressure such as heart disease, stroke, circulatory complications, vision complications, kidney impairment, sexual dysfunction;  Screening for signs and symptoms of depression related to chronic disease state;  Assessed social determinant of health barriers;  Advanced directives mailed   Symptom Management: Take medications as  prescribed   Attend all scheduled provider appointments Call pharmacy for medication refills 3-7 days in advance of running out of medications Attend church or other social activities Perform all self care activities independently  Perform IADL's (shopping, preparing meals, housekeeping, managing finances) independently Call provider office for new concerns or questions  check blood pressure 3 times per week choose a place to take my blood pressure (home, clinic or office, retail store) write blood pressure results in a log or diary learn about high blood pressure keep a blood pressure log take blood pressure log to all doctor appointments call doctor for signs and symptoms of high blood pressure keep all doctor appointments take medications for blood pressure exactly as prescribed report new symptoms to your doctor eat more whole grains, fruits and vegetables, lean meats and healthy fats Advanced directives mailed Look over education sent via my chart- hypoglycemia  Follow Up Plan: Telephone follow up appointment with care management team member scheduled for:  11/11/22 at 3 pm          Patient verbalizes understanding of instructions and care plan provided today and agrees to view in [place]. Active MyChart status and patient understanding of how to access instructions and care plan via MyChart confirmed with patient.     Telephone follow up appointment with care management team member scheduled for:  11/11/22 at 3 pm  Low-Sodium Eating Plan Sodium, which is an element that makes up salt, helps you maintain  a healthy balance of fluids in your body. Too much sodium can increase your blood pressure and cause fluid and waste to be held in your body. Your health care provider or dietitian may recommend following this plan if you have high blood pressure (hypertension), kidney disease, liver disease, or heart failure. Eating less sodium can help lower your blood pressure, reduce  swelling, and protect your heart, liver, and kidneys. What are tips for following this plan? Reading food labels The Nutrition Facts label lists the amount of sodium in one serving of the food. If you eat more than one serving, you must multiply the listed amount of sodium by the number of servings. Choose foods with less than 140 mg of sodium per serving. Avoid foods with 300 mg of sodium or more per serving. Shopping  Look for lower-sodium products, often labeled as "low-sodium" or "no salt added." Always check the sodium content, even if foods are labeled as "unsalted" or "no salt added." Buy fresh foods. Avoid canned foods and pre-made or frozen meals. Avoid canned, cured, or processed meats. Buy breads that have less than 80 mg of sodium per slice. Cooking  Eat more home-cooked food and less restaurant, buffet, and fast food. Avoid adding salt when cooking. Use salt-free seasonings or herbs instead of table salt or sea salt. Check with your health care provider or pharmacist before using salt substitutes. Cook with plant-based oils, such as canola, sunflower, or olive oil. Meal planning When eating at a restaurant, ask that your food be prepared with less salt or no salt, if possible. Avoid dishes labeled as brined, pickled, cured, smoked, or made with soy sauce, miso, or teriyaki sauce. Avoid foods that contain MSG (monosodium glutamate). MSG is sometimes added to [place] food, bouillon, and some canned foods. Make meals that can be grilled, baked, poached, roasted, or steamed. These are generally made with less sodium. General information Most people on this plan should limit their sodium intake to 1,500-2,000 mg (milligrams) of sodium each day. What foods should I eat? Fruits Fresh, frozen, or canned fruit. Fruit juice. Vegetables Fresh or frozen vegetables. "No salt added" canned vegetables. "No salt added" tomato sauce and paste. Low-sodium or reduced-sodium tomato and  vegetable juice. Grains Low-sodium cereals, including oats, puffed wheat and rice, and shredded wheat. Low-sodium crackers. Unsalted rice. Unsalted pasta. Low-sodium bread. Whole-grain breads and whole-grain pasta. Meats and other proteins Fresh or frozen (no salt added) meat, poultry, seafood, and fish. Low-sodium canned tuna and salmon. Unsalted nuts. Dried peas, beans, and lentils without added salt. Unsalted canned beans. Eggs. Unsalted nut butters. Dairy Milk. Soy milk. Cheese that is naturally low in sodium, such as ricotta cheese, fresh mozzarella, or Swiss cheese. Low-sodium or reduced-sodium cheese. Cream cheese. Yogurt. Seasonings and condiments Fresh and dried herbs and spices. Salt-free seasonings. Low-sodium mustard and ketchup. Sodium-free salad dressing. Sodium-free light mayonnaise. Fresh or refrigerated horseradish. Lemon juice. Vinegar. Other foods Homemade, reduced-sodium, or low-sodium soups. Unsalted popcorn and pretzels. Low-salt or salt-free chips. The items listed above may not be a complete list of foods and beverages you can eat. Contact a dietitian for more information. What foods should I avoid? Vegetables Sauerkraut, pickled vegetables, and relishes. Olives. [place] fries. Onion rings. Regular canned vegetables (not low-sodium or reduced-sodium). Regular canned tomato sauce and paste (not low-sodium or reduced-sodium). Regular tomato and vegetable juice (not low-sodium or reduced-sodium). Frozen vegetables in sauces. Grains Instant hot cereals. Bread stuffing, pancake, and biscuit mixes. Croutons. Seasoned rice or pasta mixes.  Noodle soup cups. Boxed or frozen macaroni and cheese. Regular salted crackers. Self-rising flour. Meats and other proteins Meat or fish that is salted, canned, smoked, spiced, or pickled. Precooked or cured meat, such as sausages or meat loaves. Berniece Salines. Ham. Pepperoni. Hot dogs. Corned beef. Chipped beef. Salt pork. Jerky. Pickled herring.  Anchovies and sardines. Regular canned tuna. Salted nuts. Dairy Processed cheese and cheese spreads. Hard cheeses. Cheese curds. Blue cheese. Feta cheese. String cheese. Regular cottage cheese. Buttermilk. Canned milk. Fats and oils Salted butter. Regular margarine. Ghee. Bacon fat. Seasonings and condiments Onion salt, garlic salt, seasoned salt, table salt, and sea salt. Canned and packaged gravies. Worcestershire sauce. Tartar sauce. Barbecue sauce. Teriyaki sauce. Soy sauce, including reduced-sodium. Steak sauce. Fish sauce. Oyster sauce. Cocktail sauce. Horseradish that you find on the shelf. Regular ketchup and mustard. Meat flavorings and tenderizers. Bouillon cubes. Hot sauce. Pre-made or packaged marinades. Pre-made or packaged taco seasonings. Relishes. Regular salad dressings. Salsa. Other foods Salted popcorn and pretzels. Corn chips and puffs. Potato and tortilla chips. Canned or dried soups. Pizza. Frozen entrees and pot pies. The items listed above may not be a complete list of foods and beverages you should avoid. Contact a dietitian for more information. Summary Eating less sodium can help lower your blood pressure, reduce swelling, and protect your heart, liver, and kidneys. Most people on this plan should limit their sodium intake to 1,500-2,000 mg (milligrams) of sodium each day. Canned, boxed, and frozen foods are high in sodium. Restaurant foods, fast foods, and pizza are also very high in sodium. You also get sodium by adding salt to food. Try to cook at home, eat more fresh fruits and vegetables, and eat less fast food and canned, processed, or prepared foods. This information is not intended to replace advice given to you by your health care provider. Make sure you discuss any questions you have with your health care provider. Document Revised: 08/12/2019 Document Reviewed: 06/08/2019 Elsevier Patient Education  [place]. Hypoglycemia Hypoglycemia is when the  sugar (glucose) level in your blood is too low. Low blood sugar can happen to people who have diabetes and people who do not have diabetes. Low blood sugar can happen quickly, and it can be an emergency. What are the causes? This condition happens most often in people who have diabetes. It may be caused by: Diabetes medicine. Not eating enough, or not eating often enough. Doing more physical activity. Drinking alcohol on an empty stomach. If you do not have diabetes, this condition may be caused by: A tumor in the pancreas. Not eating enough, or not eating for long periods at a time (fasting). A very bad infection or illness. Problems after having weight loss (bariatric) surgery. Kidney failure or liver failure. Certain medicines. What increases the risk? This condition is more likely to develop in people who: Have diabetes and take medicines to lower their blood sugar. Abuse alcohol. Have a very bad illness. What are the signs or symptoms? Mild Hunger. Sweating and feeling clammy. Feeling dizzy or light-headed. Being sleepy or having trouble sleeping. Feeling like you may vomit (nauseous). A fast heartbeat. A headache. Blurry vision. Mood changes, such as: Being grouchy. Feeling worried or nervous (anxious). Tingling or loss of feeling (numbness) around your mouth, lips, or tongue. Moderate Confusion and poor judgment. Behavior changes. Weakness. Uneven heartbeat. Trouble with moving (coordination). Very low Very low blood sugar (severe hypoglycemia) is a medical emergency. It can cause: Fainting. Seizures. Loss of consciousness (coma).  Death. How is this treated? Treating low blood sugar Low blood sugar is often treated by eating or drinking something that has sugar in it right away. The food or drink should contain 15 grams of a fast-acting carb (carbohydrate). Options include: 4 oz (120 mL) of fruit juice. 4 oz (120 mL) of regular soda (not diet soda). A few  pieces of hard candy. Check food labels to see how many pieces to eat for 15 grams. 1 Tbsp (15 mL) of sugar or honey. 4 glucose tablets. 1 tube of glucose gel. Treating low blood sugar if you have diabetes If you can think clearly and swallow safely, follow the 15:15 rule: Take 15 grams of a fast-acting carb. Talk with your doctor about how much you should take. Always keep a source of fast-acting carb with you, such as: Glucose tablets (take 4 tablets). A few pieces of hard candy. Check food labels to see how many pieces to eat for 15 grams. 4 oz (120 mL) of fruit juice. 4 oz (120 mL) of regular soda (not diet soda). 1 Tbsp (15 mL) of honey or sugar. 1 tube of glucose gel. Check your blood sugar 15 minutes after you take the carb. If your blood sugar is still at or below 70 mg/dL (3.9 mmol/L), take 15 grams of a carb again. If your blood sugar does not go above 70 mg/dL (3.9 mmol/L) after 3 tries, get help right away. After your blood sugar goes back to normal, eat a meal or a snack within 1 hour.  Treating very low blood sugar If your blood sugar is below 54 mg/dL (3 mmol/L), you have very low blood sugar, or severe hypoglycemia. This is an emergency. Get medical help right away. If you have very low blood sugar and you cannot eat or drink, you will need to be given a hormone called glucagon. A family member or friend should learn how to check your blood sugar and how to give you glucagon. Ask your doctor if you need to have an emergency glucagon kit at home. Very low blood sugar may also need to be treated in a hospital. Follow these instructions at home: General instructions Take over-the-counter and prescription medicines only as told by your doctor. Stay aware of your blood sugar as told by your doctor. If you drink alcohol: Limit how much you have to: 0-1 drink a day for women who are not pregnant. 0-2 drinks a day for men. Know how much alcohol is in your drink. In the [place]  one drink equals one 12 oz bottle of beer (355 mL), one 5 oz glass of wine (148 mL), or one 1 oz glass of hard liquor (44 mL). Be sure to eat food when you drink alcohol. Know that your body absorbs alcohol quickly. This may lead to low blood sugar later. Be sure to keep checking your blood sugar. Keep all follow-up visits. If you have diabetes:  Always have a fast-acting carb (15 grams) with you to treat low blood sugar. Follow your diabetes care plan as told by your doctor. Make sure you: Know the symptoms of low blood sugar. Check your blood sugar as often as told. Always check it before and after exercise. Always check your blood sugar before you drive. Take your medicines as told. Follow your meal plan. Eat on time. Do not skip meals. Share your diabetes care plan with: Your work or school. People you live with. Carry a card or wear jewelry that says you  have diabetes. Where to find more information American Diabetes Association: www.diabetes.org Contact a doctor if: You have trouble keeping your blood sugar in your target range. You have low blood sugar often. Get help right away if: You still have symptoms after you eat or drink something that contains 15 grams of fast-acting carb, and you cannot get your blood sugar above 70 mg/dL by following the 15:15 rule. Your blood sugar is below 54 mg/dL (3 mmol/L). You have a seizure. You faint. These symptoms may be an emergency. Get help right away. Call your local emergency services (911 in the [place] Do not wait to see if the symptoms will go away. Do not drive yourself to the hospital. Summary Hypoglycemia happens when the level of sugar (glucose) in your blood is too low. Low blood sugar can happen to people who have diabetes and people who do not have diabetes. Low blood sugar can happen quickly, and it can be an emergency. Make sure you know the symptoms of low blood sugar and know how to treat it. Always keep a source of  sugar (fast-acting carb) with you to treat low blood sugar. This information is not intended to replace advice given to you by your health care provider. Make sure you discuss any questions you have with your health care provider. Document Revised: 06/07/2020 Document Reviewed: 06/07/2020 Elsevier Patient Education  [place].

## 2022-09-03 NOTE — Progress Notes (Signed)
[facility] PREP Weekly Session  Patient Details  Name: Martha Pham MRN: [id number] Date of Birth: 1951/01/24 Age: 72 y.o. PCP: Glendale Chard, MD  There were no vitals filed for this visit.   [facility] Weekly seesion - 09/03/22 1500       [facility] "PREP" Location   [facility] "PREP" Location [facility]      Weekly Session   Topic Discussed Goal setting and welcome to the program   scale of perceived exertion   Classes attended to date 2             Barnett Hatter 09/03/2022, 3:55 PM

## 2022-09-07 ENCOUNTER — Other Ambulatory Visit: Payer: Self-pay | Admitting: Internal Medicine

## 2022-09-09 ENCOUNTER — Other Ambulatory Visit: Payer: Self-pay

## 2022-09-09 ENCOUNTER — Other Ambulatory Visit: Payer: Medicare Other | Admitting: Pharmacist

## 2022-09-09 DIAGNOSIS — N1831 Chronic kidney disease, stage 3a: Secondary | ICD-10-CM | POA: Diagnosis not present

## 2022-09-09 MED ORDER — ONETOUCH VERIO W/DEVICE KIT: PACK | 1 refills | Status: AC

## 2022-09-09 MED ORDER — ONETOUCH VERIO VI STRP: ORAL_STRIP | 12 refills | Status: AC

## 2022-09-09 NOTE — Progress Notes (Signed)
[facility] PREP Weekly Session  Patient Details  Name: Martha Pham MRN: [id number] Date of Birth: 1950-10-07 Age: 72 y.o. PCP: Glendale Chard, MD  Vitals:   09/08/22 1430  Weight: 148 lb 12.8 oz (67.5 kg)     [facility] Weekly seesion - 09/09/22 1700       [facility] "PREP" Location   [facility] "PREP" Location [facility]      Weekly Session   Topic Discussed Importance of resistance training;Other ways to be active    Minutes exercised this week 30 minutes    Classes attended to date [place] 09/09/2022, 5:11 PM

## 2022-09-12 ENCOUNTER — Other Ambulatory Visit: Payer: Self-pay

## 2022-09-12 MED ORDER — ONETOUCH DELICA LANCETS 30G MISC: 1 refills | Status: DC

## 2022-09-16 NOTE — Progress Notes (Signed)
[facility] PREP Weekly Session  Patient Details  Name: Martha Pham MRN: [id number] Date of Birth: 09-Dec-1950 Age: 73 y.o. PCP: Glendale Chard, MD  Vitals:   09/15/22 1430  Weight: 152 lb 9.6 oz (69.2 kg)     [facility] Weekly seesion - 09/16/22 1100       [facility] "PREP" Location   [facility] "PREP" Location [facility]      Weekly Session   Topic Discussed Healthy eating tips    Minutes exercised this week 175 minutes    Classes attended to date [place] 09/16/2022, 11:53 AM

## 2022-09-18 ENCOUNTER — Other Ambulatory Visit: Payer: Medicare Other | Admitting: Pharmacist

## 2022-09-18 DIAGNOSIS — E1159 Type 2 diabetes mellitus with other circulatory complications: Secondary | ICD-10-CM | POA: Diagnosis not present

## 2022-09-18 DIAGNOSIS — I10 Essential (primary) hypertension: Secondary | ICD-10-CM | POA: Diagnosis not present

## 2022-09-18 NOTE — Progress Notes (Signed)
09/18/2022 Name: Martha Pham MRN: [id number] DOB: 1950-12-28  Chief Complaint  Patient presents with   Medication Management   Diabetes   Hyperlipidemia   Hypertension    Martha Pham is a 72 y.o. year old female who presented for a telephone visit.   They were referred to the pharmacist by their Case Management Team  for assistance in managing complex medication management.   Subjective:  Care Team: Primary Care Provider: Glendale Chard, MD ; Next Scheduled Visit: 10/02/22  Medication Access/Adherence  Current Pharmacy:  [facility], [place] [address] Phone: [phone] Fax: [facility] [address] [place] [address] [address] [place] [address] Phone: [phone] Fax: [phone]   Patient reports affordability concerns with their medications: No  Patient reports access/transportation concerns to their pharmacy: No  Patient reports adherence concerns with their medications:  No     Diabetes:  Current medications: Ozempic 0.5 mg weekly  Current glucose readings: 110s, reports a low in the 90s once, denies anytime   Patient denies hypoglycemic s/sx including dizziness, shakiness, sweating. Patient denies hyperglycemic symptoms including polyuria, polydipsia, polyphagia, nocturia, neuropathy, blurred vision.  Current medication access support: Ozempic through [employer] patient assistance  Hypertension:  Current medications: HCTZ 25 mg daily, losartan 100 mg daily, metoprolol succinate 25 mg daily  Patient has a validated, automated, upper arm home BP cuff Current blood pressure readings readings: 137/77   Patient denies hypotensive s/sx including dizziness, lightheadedness.    Hyperlipidemia/ASCVD Risk Reduction  Current lipid lowering medications: atorvastatin 40 mg daily   Does report she was confused at one point about how she was  supposed to be taking this medication, as she previously had been instructed to take 6 days per week. However, she has been taking 40 mg daily since her last lab work in December.   Neuropathic Pain:  Current medications: gabapentin 100 mg daily - realizes her medication is expired    Objective:  Lab Results  Component Value Date   HGBA1C 5.7 (H) 07/03/2022    Lab Results  Component Value Date   CREATININE 1.09 (H) 07/03/2022   BUN 20 07/03/2022   NA 142 07/03/2022   K 3.8 07/03/2022   CL 106 07/03/2022   CO2 22 07/03/2022    Lab Results  Component Value Date   CHOL 177 07/03/2022   HDL 63 07/03/2022   LDLCALC 103 (H) 07/03/2022   TRIG 58 07/03/2022   CHOLHDL 2.8 07/03/2022    Medications Reviewed Today     Reviewed by Osker Mason, RPH-CPP (Pharmacist) on 09/18/22 at 1523  Med List Status: <None>   Medication Order Taking? Sig Documenting Provider Last Dose Status Informant  acetaminophen (TYLENOL) 500 MG tablet [id number] Yes Take 1,000 mg by mouth every 6 (six) hours as needed for moderate pain.  [provider] Taking Active Self  Ascorbic Acid (VITAMIN C) 1000 MG tablet [id number] Yes Take 1,000 mg by mouth daily. [provider] Taking Active Self  atorvastatin (LIPITOR) 40 MG tablet [id number] Yes Take 1 tablet (40 mg total) by mouth daily. Glendale Chard, MD Taking Active   Blood Glucose Monitoring Suppl [facility] VERIO) w/Device Drucie Opitz [id number] Yes USE AS DIRECTED TO CHECK BLOOD SUGARS Glendale Chard, MD Taking Active   calcium carbonate (OS-CAL) 600 MG TABS [id number] Yes Take 600 mg by mouth 2 (two) times daily with a meal.  [provider] Taking Active Self  Carboxymethylcellulose Sodium (REFRESH OP) [id number] Yes Apply 1 drop to eye 2 (two) times daily as needed. For dry eyes [provider] Taking Active Self  Cyanocobalamin (VITAMIN B-12) 2500 MCG SUBL [id number] Yes Place 1 tablet under the tongue daily. [provider] Taking Active Self  gabapentin (NEURONTIN) 100 MG capsule [id number] Yes Take 1 pill at bedtime as needed for leg pain. Ward Givens, NP Taking Active   glucose blood (CONTOUR NEXT TEST) test strip [id number] Yes USE AS DIRECTED TO  CHECK  BLOOD  SUGARS  ONCE  A  Lynnell Dike, MD Taking Active   glucose blood [facility] VERIO) test strip [id number] Yes Use as instructed Glendale Chard, MD Taking Active   hydrochlorothiazide (HYDRODIURIL) 25 MG tablet [id number] Yes Take 1 tablet by mouth once daily Glendale Chard, MD Taking Active   loratadine (CLARITIN) 10 MG tablet [id number] Yes Take 1 tablet (10 mg total) by mouth daily. Glendale Chard, MD Taking Active   losartan (COZAAR) 100 MG tablet [id number] Yes Take 1 tablet by mouth once daily Glendale Chard, MD Taking Active   Magnesium 400 MG CAPS [id number] Yes Take by mouth daily.  [provider] Taking Active   metoprolol succinate (TOPROL-XL) 25 MG 24 hr tablet [id number] Yes Take 1 tablet by mouth once daily Glendale Chard, MD Taking Active   Microlet Lancets MISC [id number] Yes USE AS DIRECTED TO  CHECK  BLOOD  SUGARS  ONE  TIME  A  Lynnell Dike, MD Taking Active   OneTouch Delica Lancets [id number] Yes USE ONCE DAILY TO CHECK BLOOD SUGAR. Glendale Chard, MD Taking Active   Semaglutide,0.25 or 0.'5MG'$ /DOS, (OZEMPIC, 0.25 OR 0.5 MG/DOSE,) 2 MG/3ML SOPN [id number] Yes Inject 0.5 mg into the skin once a week. Glendale Chard, MD Taking Active   Vitamin D, Cholecalciferol, 50 MCG (2000 UT) CAPS [id number] Yes Take by mouth.  [provider] Taking Active               Assessment/Plan:   Diabetes: - Currently controlled but with opportunity for optimization - Recommend to consider SGLT2 for CKD Discussed with patient. She notes she saw nephrology a few weeks ago and they did not have any recommendations or suggestions. Encouraged patient to discuss [place] with PCP; discussed patient's income and she  would qualify to get [place] for free from [employer].  - Recommend to continue current regimen at this time  Hypertension: - Currently controlled - Reviewed long term cardiovascular and renal outcomes of uncontrolled blood pressure - Reviewed appropriate blood pressure monitoring technique and reviewed goal blood pressure. Recommended to check home blood pressure and heart rate periodically - Recommend to continue current regimen at this time   Hyperlipidemia/ASCVD Risk Reduction: - Currently uncontrolled.  - Recommend to continue current regimen. If next LDL is not at goal <70, recommend to increase atorvastatin to 80 mg daily.    Will collaborate with PCP for gabapentin refill  Follow Up Plan: pharmacy needs met; patient will outreach me if she needs support with medication access  Catie Hedwig Morton, PharmD, [place], [facility] [phone]

## 2022-09-18 NOTE — Patient Instructions (Signed)
Margeret,   It was great talking with you today!  Talk to Dr. Baird Cancer about a medication called Wilder Glade - this is a medication that helps lower blood sugars but has also been shown to reduce the risk of progression of renal disease or dialysis.   You would qualify to get this medication for free from the manufacturer, [employer].   Call me if you ever need help with that!  Thanks,   Catie Hedwig Morton, PharmD, [place], [facility] [phone]

## 2022-09-26 ENCOUNTER — Ambulatory Visit (INDEPENDENT_AMBULATORY_CARE_PROVIDER_SITE_OTHER): Payer: Medicare Other | Admitting: Internal Medicine

## 2022-09-26 ENCOUNTER — Other Ambulatory Visit (HOSPITAL_COMMUNITY)
Admission: RE | Admit: 2022-09-26 | Discharge: 2022-09-26 | Disposition: A | Discharge status: Home / Self Care | Payer: Medicare Other | Source: Ambulatory Visit | Attending: Internal Medicine | Admitting: Internal Medicine

## 2022-09-26 ENCOUNTER — Other Ambulatory Visit: Payer: Self-pay

## 2022-09-26 ENCOUNTER — Encounter: Payer: Self-pay | Admitting: Internal Medicine

## 2022-09-26 VITALS — BP 136/84 | HR 65 | Temp 97.8°F | Ht 60.0 in | Wt 147.8 lb

## 2022-09-26 DIAGNOSIS — Z0000 Encounter for general adult medical examination without abnormal findings: Secondary | ICD-10-CM

## 2022-09-26 DIAGNOSIS — Z01419 Encounter for gynecological examination (general) (routine) without abnormal findings: Secondary | ICD-10-CM

## 2022-09-26 DIAGNOSIS — N1831 Chronic kidney disease, stage 3a: Secondary | ICD-10-CM | POA: Diagnosis not present

## 2022-09-26 DIAGNOSIS — E7800 Pure hypercholesterolemia, unspecified: Secondary | ICD-10-CM

## 2022-09-26 DIAGNOSIS — Z0142 Encounter for cervical smear to confirm findings of recent normal smear following initial abnormal smear: Secondary | ICD-10-CM | POA: Diagnosis not present

## 2022-09-26 DIAGNOSIS — Z713 Dietary counseling and surveillance: Secondary | ICD-10-CM | POA: Diagnosis not present

## 2022-09-26 DIAGNOSIS — I129 Hypertensive chronic kidney disease with stage 1 through stage 4 chronic kidney disease, or unspecified chronic kidney disease: Secondary | ICD-10-CM

## 2022-09-26 DIAGNOSIS — E663 Overweight: Secondary | ICD-10-CM

## 2022-09-26 DIAGNOSIS — Z7182 Exercise counseling: Secondary | ICD-10-CM | POA: Diagnosis not present

## 2022-09-26 DIAGNOSIS — Z6824 Body mass index (BMI) 24.0-24.9, adult: Secondary | ICD-10-CM | POA: Diagnosis not present

## 2022-09-26 DIAGNOSIS — E1122 Type 2 diabetes mellitus with diabetic chronic kidney disease: Secondary | ICD-10-CM | POA: Diagnosis not present

## 2022-09-26 DIAGNOSIS — Z6828 Body mass index (BMI) 28.0-28.9, adult: Secondary | ICD-10-CM

## 2022-09-26 DIAGNOSIS — M79642 Pain in left hand: Secondary | ICD-10-CM | POA: Diagnosis not present

## 2022-09-26 DIAGNOSIS — K5909 Other constipation: Secondary | ICD-10-CM | POA: Diagnosis not present

## 2022-09-26 LAB — POCT URINALYSIS DIPSTICK
Bilirubin, UA: NEGATIVE
Blood, UA: NEGATIVE
Glucose, UA: NEGATIVE
Ketones, UA: NEGATIVE
Leukocytes, UA: NEGATIVE
Nitrite, UA: NEGATIVE
Protein, UA: NEGATIVE
Spec Grav, UA: <=1.005 — AB (ref 1.010–1.025)
Urobilinogen, UA: 0.2 E.U./dL
pH, UA: 5.5 (ref 5.0–8.0)

## 2022-09-26 MED ORDER — HYDROCHLOROTHIAZIDE 25 MG PO TABS: 25.0000 mg | ORAL_TABLET | Freq: Every day | ORAL | 0 refills | Status: DC

## 2022-09-26 NOTE — Patient Instructions (Signed)

## 2022-09-26 NOTE — Progress Notes (Signed)
I,Martha Pham,acting as a scribe for Martha Greenland, MD.,have documented all relevant documentation on the behalf of Martha Greenland, MD,as directed by  Martha Greenland, MD while in the presence of Martha Greenland, MD.   Subjective:     Patient ID: Martha Pham , female    DOB: 22-Nov-1950 , 72 y.o.   MRN: [id number]   Chief Complaint  Patient presents with   Annual Exam   Diabetes   Hypertension    HPI  Patient presents today for HM, she would like to have a pap smear today.  The patient reports compliance with medications and has no other concerns at this time.  Denies dizziness, headache, chest pain, and SOB.         Past Medical History:  Diagnosis Date   Apnea, sleep    does wear cpap   Arthritis    Cancer ([place])    lt. breast ca-snbx   Chronic kidney disease    Contact lens/glasses fitting    wears contacts or glasses   COPD (chronic obstructive pulmonary disease) (HCC)    brother   Heart murmur    Hypercholesteremia    Hyperlipemia    Hypertension    Lower back pain    Lung cancer ([place])    Malaise and fatigue    Personal history of chemotherapy 2013   Sleep apnea    SLEEP STUDY DX 10,CPAP BUT DON'TUSE   Vitamin D deficiency    Wears dentures    upper     Family History  Problem Relation Age of Onset   Heart attack Mother    Brain cancer Father    Breast cancer Sister 81   Breast cancer Sister 22   Sleep apnea Neg Hx      Current Outpatient Medications:    acetaminophen (TYLENOL) 500 MG tablet, Take 1,000 mg by mouth every 6 (six) hours as needed for moderate pain. , Disp: , Rfl:    Ascorbic Acid (VITAMIN C) 1000 MG tablet, Take 1,000 mg by mouth daily., Disp: , Rfl:    atorvastatin (LIPITOR) 40 MG tablet, Take 1 tablet (40 mg total) by mouth daily., Disp: 90 tablet, Rfl: 3   Blood Glucose Monitoring Suppl (ONETOUCH VERIO) w/Device KIT, USE AS DIRECTED TO CHECK BLOOD SUGARS, Disp: 1 kit, Rfl: 1   calcium carbonate (OS-CAL) 600 MG TABS,  Take 600 mg by mouth 2 (two) times daily with a meal.  , Disp: , Rfl:    Carboxymethylcellulose Sodium (REFRESH OP), Apply 1 drop to eye 2 (two) times daily as needed. For dry eyes, Disp: , Rfl:    Cyanocobalamin (VITAMIN B-12) 2500 MCG SUBL, Place 1 tablet under the tongue daily., Disp: , Rfl:    glucose blood (CONTOUR NEXT TEST) test strip, USE AS DIRECTED TO  CHECK  BLOOD  SUGARS  ONCE  A  DAY, Disp: 100 each, Rfl: 0   glucose blood (ONETOUCH VERIO) test strip, Use as instructed, Disp: 100 each, Rfl: 12   loratadine (CLARITIN) 10 MG tablet, Take 1 tablet (10 mg total) by mouth daily., Disp: 30 tablet, Rfl: 2   losartan (COZAAR) 100 MG tablet, Take 1 tablet by mouth once daily, Disp: 90 tablet, Rfl: 0   Magnesium 400 MG CAPS, Take by mouth daily. , Disp: , Rfl:    metoprolol succinate (TOPROL-XL) 25 MG 24 hr tablet, Take 1 tablet by mouth once daily, Disp: 90 tablet, Rfl: 0   Microlet Lancets MISC, USE  AS DIRECTED TO  CHECK  BLOOD  SUGARS  ONE  TIME  A  DAY, Disp: 100 each, Rfl: 0   OneTouch Delica Lancets [id number] MISC, USE ONCE DAILY TO CHECK BLOOD SUGAR., Disp: 100 each, Rfl: 1   Semaglutide,0.25 or 0.5MG /DOS, (OZEMPIC, 0.25 OR 0.5 MG/DOSE,) 2 MG/3ML SOPN, Inject 0.5 mg into the skin once a week., Disp: 9 mL, Rfl: 1   Vitamin D, Cholecalciferol, 50 MCG (2000 UT) CAPS, Take by mouth. , Disp: , Rfl:    gabapentin (NEURONTIN) 100 MG capsule, Take 1 pill at bedtime as needed for leg pain., Disp: 90 capsule, Rfl: 1   hydrochlorothiazide (HYDRODIURIL) 25 MG tablet, Take 1 tablet (25 mg total) by mouth daily., Disp: 90 tablet, Rfl: 0   Allergies  Allergen Reactions   Dust Mite Extract Itching   Other     grass      The patient states she uses post menopausal status for birth control. Last LMP was No LMP recorded. Patient is postmenopausal.. Negative for Dysmenorrhea. Negative for: breast discharge, breast lump(s), breast pain and breast self exam. Associated symptoms include abnormal vaginal  bleeding. Pertinent negatives include abnormal bleeding (hematology), anxiety, decreased libido, depression, difficulty falling sleep, dyspareunia, history of infertility, nocturia, sexual dysfunction, sleep disturbances, urinary incontinence, urinary urgency, vaginal discharge and vaginal itching. Diet regular.The patient states her exercise level is  intermittent.  . The patient's tobacco use is:  Social History   Tobacco Use  Smoking Status Never  Smokeless Tobacco Never  . She has been exposed to passive smoke. The patient's alcohol use is:  Social History   Substance and Sexual Activity  Alcohol Use Not Currently   Alcohol/week: 2.0 standard drinks of alcohol   Types: 2 Glasses of wine per week   Comment: 8 oz occasionally per week   Review of Systems  Constitutional: Negative.   HENT: Negative.    Eyes: Negative.   Respiratory: Negative.    Cardiovascular: Negative.   Gastrointestinal: Negative.   Endocrine: Negative.   Genitourinary: Negative.   Musculoskeletal: Negative.   Skin: Negative.   Allergic/Immunologic: Negative.   Neurological: Negative.   Hematological: Negative.   Psychiatric/Behavioral: Negative.       Today's Vitals   09/26/22 1528  BP: 136/84  Pulse: 65  Temp: 97.8 F (36.6 C)  SpO2: 98%  Weight: 147 lb 12.8 oz (67 kg)  Height: 5' (1.524 m)   Body mass index is 28.87 kg/m.  Wt Readings from Last 3 Encounters:  10/06/22 146 lb 3.2 oz (66.3 kg)  09/26/22 147 lb 12.8 oz (67 kg)  09/15/22 152 lb 9.6 oz (69.2 kg)    Objective:  Physical Exam Vitals and nursing note reviewed. Exam conducted with a chaperone present.  Constitutional:      Appearance: Normal appearance. She is obese.  HENT:     Head: Normocephalic and atraumatic.     Right Ear: Tympanic membrane, ear canal and external ear normal.     Left Ear: Tympanic membrane, ear canal and external ear normal.     Nose: Nose normal.     Comments: Masked     Mouth/Throat:     Mouth:  Mucous membranes are moist.     Pharynx: Oropharynx is clear.     Comments: Masked  Eyes:     Extraocular Movements: Extraocular movements intact.     Conjunctiva/sclera: Conjunctivae normal.     Pupils: Pupils are equal, round, and reactive to light.  Cardiovascular:  Rate and Rhythm: Normal rate and regular rhythm.     Pulses: Normal pulses.          Dorsalis pedis pulses are 2+ on the right side and 2+ on the left side.     Heart sounds: Normal heart sounds.  Pulmonary:     Effort: Pulmonary effort is normal.     Breath sounds: Normal breath sounds.  Chest:  Breasts:    Tanner Score is 5.     Comments: S/p mastectomy on the left Healed surgical scars on the right.  Keloid right upr anterior thorax, no surrounding erythema Abdominal:     General: Bowel sounds are normal.     Palpations: Abdomen is soft.     Hernia: There is no hernia in the left inguinal area or right inguinal area.  Genitourinary:    General: Normal vulva.     Exam position: Lithotomy position.     Tanner stage (genital): 5.     Labia:        Right: No tenderness.        Left: No tenderness.      Vagina: Normal.     Cervix: Normal.     Uterus: Normal.      Adnexa:        Right: No mass.         Left: No mass.       Rectum: Guaiac result negative. No mass or tenderness.     Comments: deferred Musculoskeletal:        General: Normal range of motion.     Cervical back: Normal range of motion and neck supple.  Feet:     Right foot:     Protective Sensation: 5 sites tested.  5 sites sensed.     Skin integrity: Skin integrity normal.     Toenail Condition: Right toenails are normal.     Left foot:     Protective Sensation: 5 sites tested.  5 sites sensed.     Skin integrity: Skin integrity normal.     Toenail Condition: Left toenails are normal.  Lymphadenopathy:     Lower Body: No right inguinal adenopathy. No left inguinal adenopathy.  Skin:    General: Skin is warm and dry.  Neurological:      General: No focal deficit present.     Mental Status: She is alert and oriented to person, place, and time.  Psychiatric:        Mood and Affect: Mood normal.        Behavior: Behavior normal.       Assessment And Plan:     1. Annual physical exam Comments: A full exam was performed. Importance of monthly self breast exams was discussed with the patient.  PATIENT IS ADVISED TO GET 30-45 MINUTES REGULAR EXERCISE NO LESS THAN FOUR TO FIVE DAYS PER WEEK - BOTH WEIGHTBEARING EXERCISES AND AEROBIC ARE RECOMMENDED.  PATIENT IS ADVISED TO FOLLOW A HEALTHY DIET WITH AT LEAST SIX FRUITS/VEGGIES PER DAY, DECREASE INTAKE OF RED MEAT, AND TO INCREASE FISH INTAKE TO TWO DAYS PER WEEK.  MEATS/FISH SHOULD NOT BE FRIED, BAKED OR BROILED IS PREFERABLE.  IT IS ALSO IMPORTANT TO CUT BACK ON YOUR SUGAR INTAKE. PLEASE AVOID ANYTHING WITH ADDED SUGAR, CORN SYRUP OR OTHER SWEETENERS. IF YOU MUST USE A SWEETENER, YOU CAN TRY STEVIA. IT IS ALSO IMPORTANT TO AVOID ARTIFICIALLY SWEETENERS AND DIET BEVERAGES. LASTLY, I SUGGEST WEARING SPF 50 SUNSCREEN ON EXPOSED PARTS AND ESPECIALLY WHEN IN THE  DIRECT SUNLIGHT FOR AN EXTENDED PERIOD OF TIME.  PLEASE AVOID FAST FOOD RESTAURANTS AND INCREASE YOUR WATER INTAKE.  2. Cervical smear, as part of routine gynecological examination Comments: Pap smear performed. Stool is heme negative. - Cytology -Pap Smear - POC Hemoccult Bld/Stl (1-Cd Office Dx)  3. Type 2 diabetes mellitus with stage 3a chronic kidney disease, without long-term current use of insulin (HCC) Comments: Chronic, diabetic foot exam was performed. She will c/w weekly semaglutide and f/u in 4 months.  IT IS IMPORTANT TO MAINTAIN OPTIMAL BLOOD SUGAR CONTROL TO REDUCE YOUR RISK OF SERIOUS DIABETES COMPLICATIONS INCLUDING EYE AND KIDNEY DISEASE.  BLOOD SUGARS SHOULD BE LESS THAN 110 BEFORE MEALS AND LESS THAN 140 TWO HOURS AFTER MEALS.  PLEASE TEST BS THREE TIMES DAILY. BRING YOUR BLOOD SUGAR READINGS FOR REVIEW.  PLEASE  MAINTAIN A HEALTHY, WELL BALANCED DIET FREE OF PROCESSED FOODS AND REFINED SUGARS.  MAINTAIN YOUR REGULAR EXERCISE REGIMEN.  PLS NOTIFY OFFICE FOR UNCONTROLLED NAUSEA, VOMITING, DIARRHEA, POOR ORAL INTAKE AND FOR CONSISTENT BS READINGS ABOVE 350.  - Microalbumin / creatinine urine ratio - POCT urinalysis dipstick - CMP14+EGFR - CBC - Hemoglobin A1c - Lipid panel  4. Parenchymal renal hypertension, stage 1 through stage 4 or unspecified chronic kidney disease Comments: Chronic, fair control.  Goal BP<130/80.  EKG performed, NSR w/o acute changes. She will c/w losartan 100mg , hctz 25mg  and metoprolol XL 25mg  po qd. - EKG 12-Lead - Microalbumin / creatinine urine ratio - POCT urinalysis dipstick  5. Overweight with body mass index (BMI) of 28 to 28.9 in adult Comments: She is encouraged to aim for at least 150 minutes of exercise per week.  Patient was given opportunity to ask questions. Patient verbalized understanding of the plan and was able to repeat key elements of the plan. All questions were answered to their satisfaction.   I, Martha Greenland, MD, have reviewed all documentation for this visit. The documentation on 09/26/22 for the exam, diagnosis, procedures, and orders are all accurate and complete.   THE PATIENT IS ENCOURAGED TO PRACTICE SOCIAL DISTANCING DUE TO THE COVID-19 PANDEMIC.

## 2022-09-29 LAB — CMP14+EGFR
ALT: 21 IU/L (ref 0–32)
AST: 17 IU/L (ref 0–40)
Albumin/Globulin Ratio: 1.4 (ref 1.2–2.2)
Albumin: 4.6 g/dL (ref 3.8–4.8)
Alkaline Phosphatase: 76 IU/L (ref 44–121)
BUN/Creatinine Ratio: 23 (ref 12–28)
BUN: 22 mg/dL (ref 8–27)
Bilirubin Total: 0.5 mg/dL (ref 0.0–1.2)
CO2: 23 mmol/L (ref 20–29)
Calcium: 10.4 mg/dL — ABNORMAL HIGH (ref 8.7–10.3)
Chloride: 98 mmol/L (ref 96–106)
Creatinine, Ser: 0.97 mg/dL (ref 0.57–1.00)
Globulin, Total: 3.3 g/dL (ref 1.5–4.5)
Glucose: 88 mg/dL (ref 70–99)
Potassium: 3.5 mmol/L (ref 3.5–5.2)
Sodium: 136 mmol/L (ref 134–144)
Total Protein: 7.9 g/dL (ref 6.0–8.5)
eGFR: 62 mL/min/{1.73_m2} (ref >59)

## 2022-09-29 LAB — CBC
Hematocrit: 35.9 % (ref 34.0–46.6)
Hemoglobin: 12.1 g/dL (ref 11.1–15.9)
MCH: 31.8 pg (ref 26.6–33.0)
MCHC: 33.7 g/dL (ref 31.5–35.7)
MCV: 95 fL (ref 79–97)
Platelets: 208 10*3/uL (ref 150–450)
RBC: 3.8 x10E6/uL (ref 3.77–5.28)
RDW: 10.7 % — ABNORMAL LOW (ref 11.7–15.4)
WBC: 6.3 10*3/uL (ref 3.4–10.8)

## 2022-09-29 LAB — MICROALBUMIN / CREATININE URINE RATIO
Creatinine, Urine: 32.3 mg/dL
Microalb/Creat Ratio: <9 mg/g creat (ref 0–29)
Microalbumin, Urine: <3 ug/mL

## 2022-09-29 LAB — HEMOGLOBIN A1C
Est. average glucose Bld gHb Est-mCnc: 108 mg/dL
Hgb A1c MFr Bld: 5.4 % (ref 4.8–5.6)

## 2022-09-29 LAB — LIPID PANEL
Chol/HDL Ratio: 2.3 ratio (ref 0.0–4.4)
Cholesterol, Total: 160 mg/dL (ref 100–199)
HDL: 70 mg/dL (ref >39)
LDL Chol Calc (NIH): 74 mg/dL (ref 0–99)
Triglycerides: 83 mg/dL (ref 0–149)
VLDL Cholesterol Cal: 16 mg/dL (ref 5–40)

## 2022-09-30 ENCOUNTER — Ambulatory Visit
Admission: RE | Admit: 2022-09-30 | Discharge: 2022-09-30 | Disposition: A | Discharge status: Home / Self Care | Payer: Medicare Other | Source: Ambulatory Visit | Attending: Internal Medicine | Admitting: Internal Medicine

## 2022-09-30 DIAGNOSIS — Z1231 Encounter for screening mammogram for malignant neoplasm of breast: Secondary | ICD-10-CM

## 2022-10-02 ENCOUNTER — Other Ambulatory Visit: Payer: Self-pay | Admitting: Internal Medicine

## 2022-10-02 ENCOUNTER — Encounter: Payer: Medicare Other | Admitting: Internal Medicine

## 2022-10-02 DIAGNOSIS — G2581 Restless legs syndrome: Secondary | ICD-10-CM

## 2022-10-02 LAB — CYTOLOGY - PAP
Adequacy: ABSENT
Comment: NEGATIVE
Diagnosis: NEGATIVE
High risk HPV: NEGATIVE

## 2022-10-02 MED ORDER — GABAPENTIN 100 MG PO CAPS: ORAL_CAPSULE | ORAL | 1 refills | Status: DC

## 2022-10-03 ENCOUNTER — Telehealth: Payer: Self-pay

## 2022-10-03 NOTE — Progress Notes (Addendum)
As of 09/15/2022 patient picked up 5 boxes of Ozempic 0.25/0.5 mg from PCP office.   Pattricia Boss, [place] Pharmacist Assistant [phone]

## 2022-10-07 NOTE — Progress Notes (Signed)
[facility] PREP Weekly Session  Patient Details  Name: Martha Pham MRN: [id number] Date of Birth: August 05, 1950 Age: 72 y.o. PCP: Glendale Chard, MD  Vitals:   10/06/22 1430  Weight: 146 lb 3.2 oz (66.3 kg)     [facility] Weekly seesion - 10/07/22 1200       [facility] "PREP" Location   [facility] "PREP" Location [facility]      Weekly Session   Topic Discussed Stress management and problem solving    Minutes exercised this week 135 minutes    Classes attended to date 9           Class held on 10/06/22  Barnett Hatter 10/07/2022, 12:20 PM

## 2022-10-11 LAB — POC HEMOCCULT BLD/STL (OFFICE/1-CARD/DIAGNOSTIC): Fecal Occult Blood, POC: NEGATIVE

## 2022-10-16 NOTE — Progress Notes (Signed)
[facility] PREP Weekly Session  Patient Details  Name: MARIJKE MAHALA MRN: [id number] Date of Birth: February 09, 1951 Age: 72 y.o. PCP: Glendale Chard, MD  Vitals:   10/13/22 1430  Weight: 146 lb 9.6 oz (66.5 kg)     [facility] Weekly seesion - 10/16/22 1100       [facility] "PREP" Location   [facility] "PREP" Location [facility]      Weekly Session   Topic Discussed Expectations and non-scale victories    Minutes exercised this week 120 minutes    Classes attended to date 12             Barnett Hatter 10/16/2022, 11:44 AM

## 2022-10-20 ENCOUNTER — Other Ambulatory Visit: Payer: Self-pay | Admitting: Internal Medicine

## 2022-10-21 NOTE — Progress Notes (Signed)
[facility] PREP Weekly Session  Patient Details  Name: Martha Pham MRN: [id number] Date of Birth: 06-08-1951 Age: 72 y.o. PCP: Glendale Chard, MD  Vitals:   10/20/22 1430  Weight: 148 lb (67.1 kg)     [facility] Weekly seesion - 10/21/22 1100       [facility] "PREP" Location   [facility] "PREP" Location [facility]      Weekly Session   Topic Discussed --   portions   Minutes exercised this week 210 minutes    Classes attended to date 13           Class held on 10/20/22  Barnett Hatter 10/21/2022, 11:27 AM

## 2022-10-28 NOTE — Progress Notes (Signed)
[facility] PREP Weekly Session  Patient Details  Name: Martha Pham MRN: [id number] Date of Birth: 05/07/1951 Age: 72 y.o. PCP: Dorothyann Peng, MD  Vitals:   10/27/22 1430  Weight: 146 lb (66.2 kg)     [facility] Weekly seesion - 10/28/22 1100       [facility] "PREP" Location   [facility] "PREP" Location [facility]      Weekly Session   Topic Discussed Finding support    Minutes exercised this week 180 minutes    Classes attended to date 71             Pam Jerral Bonito 10/28/2022, 11:47 AM

## 2022-11-04 NOTE — Progress Notes (Signed)
[facility] PREP Weekly Session  Patient Details  Name: Martha Pham MRN: [id number] Date of Birth: 07-14-1951 Age: 72 y.o. PCP: Dorothyann Peng, MD  Vitals:   11/03/22 1430  Weight: 143 lb 6.4 oz (65 kg)     [facility] Weekly seesion - 11/04/22 1700       [facility] "PREP" Location   [facility]PREP" [facility]      Weekly Session   Topic Discussed Calorie breakdown    Minutes exercised this week --   not reported   Classes attended to date 17             [name] 11/04/2022, 5:35 PM

## 2022-11-05 ENCOUNTER — Telehealth: Payer: Self-pay

## 2022-11-05 NOTE — Progress Notes (Addendum)
[employer] patient assistance program notification:  120- day supply of Ozempic will be filled on 11/29/2022 and should arrive to the office in 10-14 business days. Patient enrollment will expire on 07/21/2023.    Billee Cashing, CMA Clinical Pharmacist Assistant [phone]

## 2022-11-05 NOTE — Progress Notes (Addendum)
[employer] patient assistance program notification:  120- day supply of Ozempic will be filled on 11/29/2022 and should arrive to the office in 10-14 business days. Patient enrollment will expire on 07/21/2023.    Tamala Melvin, CMA Clinical Pharmacist Assistant [phone]   

## 2022-11-11 ENCOUNTER — Ambulatory Visit (INDEPENDENT_AMBULATORY_CARE_PROVIDER_SITE_OTHER): Payer: Medicare Other | Admitting: *Deleted

## 2022-11-11 DIAGNOSIS — I10 Essential (primary) hypertension: Secondary | ICD-10-CM

## 2022-11-11 DIAGNOSIS — E1122 Type 2 diabetes mellitus with diabetic chronic kidney disease: Secondary | ICD-10-CM

## 2022-11-11 NOTE — Chronic Care Management (AMB) (Signed)
Chronic Care Management   CCM RN Visit Note  11/11/2022 Name: Martha Pham MRN: [id number] DOB: May 31, 1951  Subjective: Martha Pham is a 72 y.o. year old female who is a primary care patient of Dorothyann Peng, MD. The patient was referred to the Chronic Care Management team for assistance with care management needs subsequent to provider initiation of CCM services and plan of care.    Today's Visit:  Engaged with patient by telephone for follow up visit.        Goals Addressed             This Visit's Progress    CCM (DIABETES) EXPECTED OUTCOME: MONITOR, SELF-MANAGE AND REDUCE SYMPTOMS OF DIABETES       Current Barriers:  Knowledge Deficits related to Diabetes management Chronic Disease Management support and education needs related to Diabetes, diet No Advanced Directives in place- documents to be mailed Patient reports she checks blood sugar once daily with fasting readings 112-119 Patient reports she tries to follow carbohydrate modified diet, exercises at [facility] 2 x per week and pt states this also includes classes about healthy eating and reading labels, etc. Patient reports she is also in water aerobics 2-3 days per week  Planned Interventions: Reviewed medications with patient and discussed importance of medication adherence;        Counseled on importance of regular laboratory monitoring as prescribed;        Advised patient, providing education and rationale, to check cbg once daily and record        call provider for findings outside established parameters;       Review of patient status, including review of consultants reports, relevant laboratory and other test results, and medications completed;       Reinforced carbohydrate modified diet  Symptom Management: Take medications as prescribed   Attend all scheduled provider appointments Call pharmacy for medication refills 3-7 days in advance of running out of medications Attend church or other social  activities Perform all self care activities independently  Perform IADL's (shopping, preparing meals, housekeeping, managing finances) independently Call provider office for new concerns or questions  check blood sugar at prescribed times: once daily check feet daily for cuts, sores or redness enter blood sugar readings and medication or insulin into daily log take the blood sugar log to all doctor visits take the blood sugar meter to all doctor visits set goal weight trim toenails straight across fill half of plate with vegetables limit fast food meals to no more than 1 per week manage portion size prepare main meal at home 3 to 5 days each week read food labels for fat, fiber, carbohydrates and portion size keep feet up while sitting wash and dry feet carefully every day Follow carbohydrate modified diet/ plate method Continue exercising at the [facility] and doing water aerobics- keep up the good work!  Follow Up Plan: Telephone follow up appointment with care management team member scheduled for:  02/03/23 at 3 pm       CCM (HYPERTENSION) EXPECTED OUTCOME: MONITOR, SELF-MANAGE AND REDUCE SYMPTOMS OF HYPERTENSION       Current Barriers:  Knowledge Deficits related to Hypertension management Chronic Disease Management support and education needs related to Hypertension, diet, exercise No Advanced Directives in place- documents previously mailed Patient reports she lives alone, granddaughter comes by and stays at times and checks in on pt,  pt states she can call on her daughter if needed Patient reports she is independent in all  aspects of her care, continues to drive, checks blood pressure several times per week with most recent reading 127/73  Planned Interventions: Evaluation of current treatment plan related to hypertension self management and patient's adherence to plan as established by provider;   Reviewed medications with patient and discussed importance of compliance;   Counseled on the importance of exercise goals with target of 150 minutes per week Discussed plans with patient for ongoing care management follow up and provided patient with direct contact information for care management team; Advised patient, providing education and rationale, to monitor blood pressure daily and record, calling PCP for findings outside established parameters;  Advised patient to discuss any issues with blood pressure, medications with provider; Discussed complications of poorly controlled blood pressure such as heart disease, stroke, circulatory complications, vision complications, kidney impairment, sexual dysfunction;  Reinforced low sodium diet  Symptom Management: Take medications as prescribed   Attend all scheduled provider appointments Call pharmacy for medication refills 3-7 days in advance of running out of medications Attend church or other social activities Perform all self care activities independently  Perform IADL's (shopping, preparing meals, housekeeping, managing finances) independently Call provider office for new concerns or questions  check blood pressure 3 times per week choose a place to take my blood pressure (home, clinic or office, retail store) write blood pressure results in a log or diary learn about high blood pressure keep a blood pressure log take blood pressure log to all doctor appointments call doctor for signs and symptoms of high blood pressure keep all doctor appointments take medications for blood pressure exactly as prescribed report new symptoms to your doctor eat more whole grains, fruits and vegetables, lean meats and healthy fats Follow low sodium diet- read food labels  Follow Up Plan: Telephone follow up appointment with care management team member scheduled for:   02/03/23 at 3 pm          Plan:Telephone follow up appointment with care management team member scheduled for:  02/03/23 at 3 pm  Irving Shows [facility],  BSN RN Case Manager [facility] [phone]

## 2022-11-11 NOTE — Patient Instructions (Signed)
Please call the care guide team at [phone] if you need to cancel or reschedule your appointment.   If you are experiencing a Mental Health or Behavioral Health Crisis or need someone to talk to, please call the Suicide and Crisis Lifeline: 988 call the [place] National Suicide Prevention Lifeline: 7260769413 or TTY: 803-791-6469 TTY (208)070-9029) to talk to a trained counselor call 1-800-273-TALK (toll free, 24 hour hotline) go to [facility] [address] [place] [phone]) call 911   Following is a copy of the CCM Program Consent:  CCM service includes personalized support from designated clinical staff supervised by the physician, including individualized plan of care and coordination with other care providers 24/7 contact phone numbers for assistance for urgent and routine care needs. Service will only be billed when office clinical staff spend 20 minutes or more in a month to coordinate care. Only one practitioner may furnish and bill the service in a calendar month. The patient may stop CCM services at amy time (effective at the end of the month) by phone call to the office staff. The patient will be responsible for cost sharing (co-pay) or up to 20% of the service fee (after annual deductible is met)  Following is a copy of your full provider care plan:   Goals Addressed             This Visit's Progress    CCM (DIABETES) EXPECTED OUTCOME: MONITOR, SELF-MANAGE AND REDUCE SYMPTOMS OF DIABETES       Current Barriers:  Knowledge Deficits related to Diabetes management Chronic Disease Management support and education needs related to Diabetes, diet No Advanced Directives in place- documents to be mailed Patient reports she checks blood sugar once daily with fasting readings 112-119 Patient reports she tries to follow carbohydrate modified diet, exercises at [facility] 2 x per week and pt states this also includes classes about  healthy eating and reading labels, etc. Patient reports she is also in water aerobics 2-3 days per week  Planned Interventions: Reviewed medications with patient and discussed importance of medication adherence;        Counseled on importance of regular laboratory monitoring as prescribed;        Advised patient, providing education and rationale, to check cbg once daily and record        call provider for findings outside established parameters;       Review of patient status, including review of consultants reports, relevant laboratory and other test results, and medications completed;       Reinforced carbohydrate modified diet  Symptom Management: Take medications as prescribed   Attend all scheduled provider appointments Call pharmacy for medication refills 3-7 days in advance of running out of medications Attend church or other social activities Perform all self care activities independently  Perform IADL's (shopping, preparing meals, housekeeping, managing finances) independently Call provider office for new concerns or questions  check blood sugar at prescribed times: once daily check feet daily for cuts, sores or redness enter blood sugar readings and medication or insulin into daily log take the blood sugar log to all doctor visits take the blood sugar meter to all doctor visits set goal weight trim toenails straight across fill half of plate with vegetables limit fast food meals to no more than 1 per week manage portion size prepare main meal at home 3 to 5 days each week read food labels for fat, fiber, carbohydrates and portion size keep feet up while sitting  wash and dry feet carefully every day Follow carbohydrate modified diet/ plate method Continue exercising at the [facility] and doing water aerobics- keep up the good work!  Follow Up Plan: Telephone follow up appointment with care management team member scheduled for:  02/03/23 at 3 pm       CCM (HYPERTENSION)  EXPECTED OUTCOME: MONITOR, SELF-MANAGE AND REDUCE SYMPTOMS OF HYPERTENSION       Current Barriers:  Knowledge Deficits related to Hypertension management Chronic Disease Management support and education needs related to Hypertension, diet, exercise No Advanced Directives in place- documents previously mailed Patient reports she lives alone, granddaughter comes by and stays at times and checks in on pt,  pt states she can call on her daughter if needed Patient reports she is independent in all aspects of her care, continues to drive, checks blood pressure several times per week with most recent reading 127/73  Planned Interventions: Evaluation of current treatment plan related to hypertension self management and patient's adherence to plan as established by provider;   Reviewed medications with patient and discussed importance of compliance;  Counseled on the importance of exercise goals with target of 150 minutes per week Discussed plans with patient for ongoing care management follow up and provided patient with direct contact information for care management team; Advised patient, providing education and rationale, to monitor blood pressure daily and record, calling PCP for findings outside established parameters;  Advised patient to discuss any issues with blood pressure, medications with provider; Discussed complications of poorly controlled blood pressure such as heart disease, stroke, circulatory complications, vision complications, kidney impairment, sexual dysfunction;  Reinforced low sodium diet  Symptom Management: Take medications as prescribed   Attend all scheduled provider appointments Call pharmacy for medication refills 3-7 days in advance of running out of medications Attend church or other social activities Perform all self care activities independently  Perform IADL's (shopping, preparing meals, housekeeping, managing finances) independently Call provider office for new  concerns or questions  check blood pressure 3 times per week choose a place to take my blood pressure (home, clinic or office, retail store) write blood pressure results in a log or diary learn about high blood pressure keep a blood pressure log take blood pressure log to all doctor appointments call doctor for signs and symptoms of high blood pressure keep all doctor appointments take medications for blood pressure exactly as prescribed report new symptoms to your doctor eat more whole grains, fruits and vegetables, lean meats and healthy fats Follow low sodium diet- read food labels  Follow Up Plan: Telephone follow up appointment with care management team member scheduled for:   02/03/23 at 3 pm          Patient verbalizes understanding of instructions and care plan provided today and agrees to view in MyChart. Active MyChart status and patient understanding of how to access instructions and care plan via MyChart confirmed with patient.  Telephone follow up appointment with care management team member scheduled for:  02/03/23 at 3 pm

## 2022-11-12 NOTE — Progress Notes (Signed)
[facility] PREP Weekly Session  Patient Details  Name: Martha Pham MRN: [id number] Date of Birth: February 06, 1951 Age: 72 y.o. PCP: Dorothyann Peng, MD  Vitals:   11/10/22 1430  Weight: 143 lb (64.9 kg)     [facility] Weekly seesion - 11/12/22 1600       [facility] "PREP" Location   [facility] "PREP" Location [facility]      Weekly Session   Topic Discussed Hitting roadblocks    Classes attended to date 20             [name] 11/12/2022, 4:04 PM

## 2022-11-18 DIAGNOSIS — E1159 Type 2 diabetes mellitus with other circulatory complications: Secondary | ICD-10-CM | POA: Diagnosis not present

## 2022-11-18 DIAGNOSIS — I10 Essential (primary) hypertension: Secondary | ICD-10-CM

## 2022-11-25 NOTE — Progress Notes (Signed)
[facility] PREP Evaluation  Patient Details  Name: Martha Pham MRN: [id number] Date of Birth: [birth year] Age: 72 y.o. PCP: Dorothyann Peng, MD  Vitals:   11/24/22 1530  BP: 128/70  Pulse: 65  SpO2: 99%  Weight: 144 lb 9.6 oz (65.6 kg)     [facility] Eval - 11/25/22 1600       [facility] "PREP" Location   [facility] "PREP" Location [facility]      Referral    Referring Provider Allyne Gee    Program Start Date 09/01/22    Program End Date 11/19/22      Measurement   Waist Circumference 36 inches    Waist Circumference End Program 36 inches    Hip Circumference 38 inches    Hip Circumference End Program 38 inches    Body fat 43.2 percent      Mobility and Daily Activities   I find it easy to walk up or down two or more flights of stairs. 2    I have no trouble taking out the trash. 4    I do housework such as vacuuming and dusting on my own without difficulty. 4    I can easily lift a gallon of milk (8lbs). 4    I can easily walk a mile. 2    I have no trouble reaching into high cupboards or reaching down to pick up something from the floor. 2    I do not have trouble doing out-door work such as Loss adjuster, chartered, raking leaves, or gardening. 3      Mobility and Daily Activities   I feel younger than my age. 2    I feel independent. 4    I feel energetic. 2    I live an active life.  4    I feel strong. 2    I feel healthy. 3    I feel active as other people my age. 3      How fit and strong are you.   Fit and Strong Total Score 41            Past Medical History:  Diagnosis Date   Apnea, sleep    does wear cpap   Arthritis    Cancer (HCC)    lt. breast ca-snbx   Chronic kidney disease    Contact lens/glasses fitting    wears contacts or glasses   COPD (chronic obstructive pulmonary disease) (HCC)    brother   Heart murmur    Hypercholesteremia    Hyperlipemia    Hypertension    Lower back pain    Lung cancer (HCC)    Malaise and fatigue    Personal history of  chemotherapy 2013   Sleep apnea    SLEEP STUDY DX 10,CPAP BUT DON'TUSE   Vitamin D deficiency    Wears dentures    upper   Past Surgical History:  Procedure Laterality Date   BREAST BIOPSY Left 05/26/2011   malignant   BREAST BIOPSY Right 10/29/2016   BREAST IMPLANT EXCHANGE Left 09/23/2012   Procedure: REMOVAL OF LEFT EXPANDER/PLACEMENT OF IMPLANT WITH REDUCTION OF RIGHT BREAST;  Surgeon: Wayland Denis, DO;  Location: [facility];  Service: Plastics;  Laterality: Left;   BREAST LUMPECTOMY W/ NEEDLE LOCALIZATION  06/24/2011   left   BREAST REDUCTION SURGERY Right 09/23/2012   Procedure: MAMMARY REDUCTION  (BREAST);  Surgeon: Wayland Denis, DO;  Location: [facility];  Service: Plastics;  Laterality: Right;  BREAST SURGERY  08/07/2011   re-exc. left breast margins   CATARACT EXTRACTION Bilateral    january and february 2022   CYST REMOVAL NECK  08/2013   Dr. Kelly Splinter   LIPOSUCTION Bilateral 09/23/2012   Procedure: LIPOSUCTION;  Surgeon: Wayland Denis, DO;  Location: [facility];  Service: Plastics;  Laterality: Bilateral;   MASTECTOMY Left 2012   MOUTH SURGERY     TOOTH IMPLANT BOTTOM X2 LFT   PORT-A-CATH REMOVAL Right 09/23/2012   Procedure: REMOVAL PORT-A-CATH;  Surgeon: Wayland Denis, DO;  Location: [facility];  Service: Plastics;  Laterality: Right;   PORTACATH PLACEMENT  06/24/2011   Procedure: INSERTION PORT-A-CATH;  Surgeon: Rulon Abide, DO;  Location: [facility];  Service: General;  Laterality: Right;  Right mediport placement   RECONSTRUCTION BREAST IMMEDIATE / DELAYED W/ TISSUE EXPANDER  04/2012   left   REDUCTION MAMMAPLASTY Right 2013   SIMPLE MASTECTOMY  09/01/2011   left   TONSILLECTOMY     tooth implant removed     10/2021   Social History   Tobacco Use  Smoking Status Never  Smokeless Tobacco Never   Attended >20 workouts and 10 educational sessions Fit testing: Cardio march  test: 180 to 253 Sit to stands: 8 to 13 Bicep curl: 10 to 17 Balance remains a challenge.  Encouraged continued exercise. Still has hip/groin pains. Will be following up with MD about that  [name] 11/25/2022, 4:35 PM

## 2022-12-01 ENCOUNTER — Inpatient Hospital Stay: Payer: Medicare Other | Attending: Adult Health | Admitting: Adult Health

## 2022-12-01 ENCOUNTER — Ambulatory Visit (HOSPITAL_COMMUNITY)
Admission: RE | Admit: 2022-12-01 | Discharge: 2022-12-01 | Disposition: A | Discharge status: Home / Self Care | Payer: Medicare Other | Source: Ambulatory Visit | Attending: Adult Health | Admitting: Adult Health

## 2022-12-01 ENCOUNTER — Other Ambulatory Visit: Payer: Self-pay

## 2022-12-01 ENCOUNTER — Encounter: Payer: Self-pay | Admitting: Adult Health

## 2022-12-01 VITALS — BP 138/71 | HR 65 | Temp 97.7°F | Resp 18 | Ht 60.0 in | Wt 147.6 lb

## 2022-12-01 DIAGNOSIS — M25552 Pain in left hip: Secondary | ICD-10-CM

## 2022-12-01 DIAGNOSIS — R102 Pelvic and perineal pain: Secondary | ICD-10-CM | POA: Insufficient documentation

## 2022-12-01 DIAGNOSIS — M160 Bilateral primary osteoarthritis of hip: Secondary | ICD-10-CM | POA: Diagnosis not present

## 2022-12-01 DIAGNOSIS — M25551 Pain in right hip: Secondary | ICD-10-CM

## 2022-12-01 DIAGNOSIS — Z808 Family history of malignant neoplasm of other organs or systems: Secondary | ICD-10-CM | POA: Insufficient documentation

## 2022-12-01 DIAGNOSIS — Z9012 Acquired absence of left breast and nipple: Secondary | ICD-10-CM | POA: Insufficient documentation

## 2022-12-01 DIAGNOSIS — Z853 Personal history of malignant neoplasm of breast: Secondary | ICD-10-CM | POA: Insufficient documentation

## 2022-12-01 DIAGNOSIS — Z803 Family history of malignant neoplasm of breast: Secondary | ICD-10-CM | POA: Diagnosis not present

## 2022-12-01 NOTE — Patient Instructions (Signed)
IMPLANT GEL 535CC - [id number]  Inventory Item: IMPLANT BREAST GEL 535CC Serial no.: [id number] Model/Cat no.: [id number] [facility]  Implant name: IMPLANT GEL 535CC - [id number] Laterality: Left Area: Breast  Manufacturer: [employer] Date of Manufacture:   Action: Implanted Number Used: 1   Device Identifier: Device Identifier Type:

## 2022-12-01 NOTE — Assessment & Plan Note (Signed)
Martha Pham is a 72 year old woman with history of left breast triple negative breast cancer diagnosed in December 2012 status post lumpectomy followed by mastectomy and adjuvant chemotherapy.  History of left breast cancer: She has no clinical or radiographic signs of breast cancer recurrence.  She will continue to undergo annual breast exam along with right breast mammogram. Martha Pham asked me to call radiology today to check on the area at 12 oclock in her right breast that was biopsied back in 2018.  I will call our radiology reading room to learn more. Pelvic discomfort and range of motion difficulty and hips: I will obtain an xray of her bilateral hips and pelvis to further evaluate.  We discussed the possibility of referring her to PT for further evaluation and treatment of the ROM difficulty as well.   Health maintenance: I congratulated Martha Pham on her weight loss.  She has worked hard to lose 35 pounds and is continuing to work towards this.  She will continue to see her PCP and we reviewed healthy diet and exercise today.    I will see Martha Pham back in 1 year for continued long term f/u.

## 2022-12-01 NOTE — Progress Notes (Signed)
[facility] Cancer Follow up:    Martha Peng, MD [address] [place] [address]   DIAGNOSIS: History of left breast cancer  SUMMARY OF ONCOLOGIC HISTORY: Oncology History  Breast cancer of upper-inner quadrant of left female breast (HCC) (Resolved)  06/12/2011 Initial Diagnosis   Cancer of upper-inner quadrant of female breast: Grade 3 IDC ER negative PR negative HER-2 negative, MRI 2.7 x 2.3 x 1 cm mass   06/24/2011 Surgery   Left breast lumpectomy 2.2 cm IDC grade 3 with high-grade DCIS triple negative, for positive margins patient had reexcision: 2 lymph nodes negative ER/PR negative HER-2 negative ratio 0.97 Ki-67 75%   09/02/2011 Surgery   Left mastectomy   10/01/2011 - 12/03/2011 Chemotherapy   Adjuvant Taxotere and Cytoxan x4 cycles   07/05/2012 Surgery   Breast reconstruction on the left breast with reduction of right breast     CURRENT THERAPY: observation  INTERVAL HISTORY: Martha Pham 72 y.o. female returns for follow-up of her history of breast cancer.  Her most recent right breast mammogram occurred on October 01, 2022 demonstrating no mammographic evidence of malignancy and breast density category B.  Martha Pham has been working on intentionally losing weight over the past several months.  She has lost down to 147 pounds.  At her highest she was 180 pounds.  She is very happy about this weight loss progress.  She notes that she has some pelvic pain and discomfort which limits the range of motion with her hips.  This has been going on for several months and she is becoming concerned about getting tightness in the joint and the joint becoming frozen.   Patient Active Problem List   Diagnosis Date Noted   Pure hypercholesterolemia 04/06/2022   Palpitations 04/06/2022   Personal history of COVID-19 04/06/2022   Mixed hyperlipidemia 06/06/2021   Class 1 obesity due to excess calories with serious comorbidity and body mass index (BMI) of  33.0 to 33.9 in adult 06/06/2021   Keloid 06/06/2021   History of breast cancer 11/23/2020   Type 2 diabetes mellitus with stage 3a chronic kidney disease, without long-term current use of insulin (HCC) 06/22/2018   Chronic renal disease, stage II 06/22/2018   Parenchymal renal hypertension 06/22/2018   Adult BMI 29.0-29.9 kg/sq m 06/22/2018   Pain in joint, pelvic region and thigh 03/28/2014   Acquired absence of breast and absent nipple 05/05/2012    is allergic to dust mite extract and other.  MEDICAL HISTORY: Past Medical History:  Diagnosis Date   Apnea, sleep    does wear cpap   Arthritis    Cancer (HCC)    lt. breast ca-snbx   Chronic kidney disease    Contact lens/glasses fitting    wears contacts or glasses   COPD (chronic obstructive pulmonary disease) (HCC)    brother   Heart murmur    Hypercholesteremia    Hyperlipemia    Hypertension    Lower back pain    Lung cancer (HCC)    Malaise and fatigue    Personal history of chemotherapy 2013   Sleep apnea    SLEEP STUDY DX 10,CPAP BUT DON'TUSE   Vitamin D deficiency    Wears dentures    upper    SURGICAL HISTORY: Past Surgical History:  Procedure Laterality Date   BREAST BIOPSY Left 05/26/2011   malignant   BREAST BIOPSY Right 10/29/2016   BREAST IMPLANT EXCHANGE Left 09/23/2012   Procedure: REMOVAL OF LEFT  EXPANDER/PLACEMENT OF IMPLANT WITH REDUCTION OF RIGHT BREAST;  Surgeon: Wayland Denis, DO;  Location: [facility];  Service: Plastics;  Laterality: Left;   BREAST LUMPECTOMY W/ NEEDLE LOCALIZATION  06/24/2011   left   BREAST REDUCTION SURGERY Right 09/23/2012   Procedure: MAMMARY REDUCTION  (BREAST);  Surgeon: Wayland Denis, DO;  Location: [facility];  Service: Plastics;  Laterality: Right;   BREAST SURGERY  08/07/2011   re-exc. left breast margins   CATARACT EXTRACTION Bilateral    january and february 2022   CYST REMOVAL NECK  08/2013   Dr. Kelly Splinter   LIPOSUCTION  Bilateral 09/23/2012   Procedure: LIPOSUCTION;  Surgeon: Wayland Denis, DO;  Location: [facility];  Service: Plastics;  Laterality: Bilateral;   MASTECTOMY Left 2012   MOUTH SURGERY     TOOTH IMPLANT BOTTOM X2 LFT   PORT-A-CATH REMOVAL Right 09/23/2012   Procedure: REMOVAL PORT-A-CATH;  Surgeon: Wayland Denis, DO;  Location: [facility];  Service: Plastics;  Laterality: Right;   PORTACATH PLACEMENT  06/24/2011   Procedure: INSERTION PORT-A-CATH;  Surgeon: Rulon Abide, DO;  Location: [facility];  Service: General;  Laterality: Right;  Right mediport placement   RECONSTRUCTION BREAST IMMEDIATE / DELAYED W/ TISSUE EXPANDER  04/2012   left   REDUCTION MAMMAPLASTY Right 2013   SIMPLE MASTECTOMY  09/01/2011   left   TONSILLECTOMY     tooth implant removed     10/2021    SOCIAL HISTORY: Social History   Socioeconomic History   Marital status: Widowed    Spouse name: Not on file   Number of children: 1   Years of education: 14   Highest education level: Not on file  Occupational History   Occupation: Retired  Tobacco Use   Smoking status: Never   Smokeless tobacco: Never  Vaping Use   Vaping Use: Never used  Substance and Sexual Activity   Alcohol use: Not Currently    Alcohol/week: 2.0 standard drinks of alcohol    Types: 2 Glasses of wine per week    Comment: 8 oz occasionally per week   Drug use: No   Sexual activity: Not Currently    Partners: Male    Birth control/protection: Post-menopausal  Other Topics Concern   Not on file  Social History Narrative   Daughter [place], [place]   Social Determinants of Health   Financial Resource Strain: Low Risk  (09/02/2022)   Overall Financial Resource Strain (CARDIA)    Difficulty of Paying Living Expenses: Not hard at all  Food Insecurity: No Food Insecurity (09/02/2022)   Hunger Vital Sign    Worried About Running Out of Food in the Last Year: Never true    Ran Out of Food in  the Last Year: Never true  Transportation Needs: No Transportation Needs (09/02/2022)   PRAPARE - Administrator, Civil Service (Medical): No    Lack of Transportation (Non-Medical): No  Physical Activity: Insufficiently Active (09/02/2022)   Exercise Vital Sign    Days of Exercise per Week: 2 days    Minutes of Exercise per Session: 50 min  Stress: No Stress Concern Present (09/02/2022)   Martha Pham of Occupational Health - Occupational Stress Questionnaire    Feeling of Stress : Not at all  Social Connections: Moderately Isolated (09/02/2022)   Social Connection and Isolation Panel [NHANES]    Frequency of Communication with Friends and Family: More than three times a week  Frequency of Social Gatherings with Friends and Family: Twice a week    Attends Religious Services: More than 4 times per year    Active Member of [employer] or Organizations: No    Attends Banker Meetings: Never    Marital Status: Widowed  Intimate Partner Violence: Not At Risk (09/02/2022)   Humiliation, Afraid, Rape, and Kick questionnaire    Fear of Current or Ex-Partner: No    Emotionally Abused: No    Physically Abused: No    Sexually Abused: No    FAMILY HISTORY: Family History  Problem Relation Age of Onset   Heart attack Mother    Brain cancer Father    Breast cancer Sister 41   Breast cancer Sister 109   Sleep apnea Neg Hx     Review of Systems  Constitutional:  Negative for appetite change, chills, fatigue, fever and unexpected weight change.  HENT:   Negative for hearing loss, lump/mass and trouble swallowing.   Eyes:  Negative for eye problems and icterus.  Respiratory:  Negative for chest tightness, cough and shortness of breath.   Cardiovascular:  Negative for chest pain, leg swelling and palpitations.  Gastrointestinal:  Negative for abdominal distention, abdominal pain, constipation, diarrhea, nausea and vomiting.  Endocrine: Negative for hot flashes.   Genitourinary:  Negative for difficulty urinating.   Musculoskeletal:  Negative for arthralgias.  Skin:  Negative for itching and rash.  Neurological:  Negative for dizziness, extremity weakness, headaches and numbness.  Hematological:  Negative for adenopathy. Does not bruise/bleed easily.  Psychiatric/Behavioral:  Negative for depression. The patient is not nervous/anxious.       PHYSICAL EXAMINATION   Onc Performance Status - 12/01/22 1324       ECOG Perf Status   ECOG Perf Status Restricted in physically strenuous activity but ambulatory and able to carry out work of a light or sedentary nature, e.g., light house work, office work      KPS SCALE   KPS % SCORE Able to carry on normal activity, minor s/s of disease             Vitals:   12/01/22 1321  BP: 138/71  Pulse: 65  Resp: 18  Temp: 97.7 F (36.5 C)  SpO2: 100%    Physical Exam Constitutional:      General: She is not in acute distress.    Appearance: Normal appearance. She is not toxic-appearing.  HENT:     Head: Normocephalic and atraumatic.  Eyes:     General: No scleral icterus. Cardiovascular:     Rate and Rhythm: Normal rate and regular rhythm.     Pulses: Normal pulses.     Heart sounds: Normal heart sounds.  Pulmonary:     Effort: Pulmonary effort is normal.     Breath sounds: Normal breath sounds.  Chest:     Comments: Left breast status postmastectomy and reconstruction no sign of local recurrence right breast is benign. Abdominal:     General: Abdomen is flat. Bowel sounds are normal. There is no distension.     Palpations: Abdomen is soft.     Tenderness: There is no abdominal tenderness.  Musculoskeletal:        General: No swelling.     Cervical back: Neck supple.  Lymphadenopathy:     Cervical: No cervical adenopathy.  Skin:    General: Skin is warm and dry.     Findings: No rash.  Neurological:     General: No focal deficit  present.     Mental Status: She is alert.   Psychiatric:        Mood and Affect: Mood normal.        Behavior: Behavior normal.     LABORATORY DATA: None for this visit     ASSESSMENT and THERAPY PLAN:   History of breast cancer Martha Pham is a 72 year old woman with history of left breast triple negative breast cancer diagnosed in December 2012 status post lumpectomy followed by mastectomy and adjuvant chemotherapy.  History of left breast cancer: She has no clinical or radiographic signs of breast cancer recurrence.  She will continue to undergo annual breast exam along with right breast mammogram. Martha Pham asked me to call radiology today to check on the area at 12 oclock in her right breast that was biopsied back in 2018.  I will call our radiology reading room to learn more. Pelvic discomfort and range of motion difficulty and hips: I will obtain an xray of her bilateral hips and pelvis to further evaluate.  We discussed the possibility of referring her to PT for further evaluation and treatment of the ROM difficulty as well.   Health maintenance: I congratulated Martha Pham on her weight loss.  She has worked hard to lose 35 pounds and is continuing to work towards this.  She will continue to see her PCP and we reviewed healthy diet and exercise today.    I will see Martha Pham back in 1 year for continued long term f/u.     All questions were answered. The patient knows to call the clinic with any problems, questions or concerns. We can certainly see the patient much sooner if necessary.  Total encounter time:30 minutes*in face-to-face visit time, chart review, lab review, care coordination, order entry, and documentation of the encounter time.    Lillard Anes, NP 12/01/22 2:47 PM Medical Oncology and Hematology [facility] [address] [place], [place] [address] Tel. [phone]    Fax. [phone]  *Total Encounter Time as defined by the Centers for Medicare and Medicaid Services includes, in addition to the  face-to-face time of a patient visit (documented in the note above) non-face-to-face time: obtaining and reviewing outside history, ordering and reviewing medications, tests or procedures, care coordination (communications with other health care professionals or caregivers) and documentation in the medical record.

## 2022-12-02 ENCOUNTER — Telehealth: Payer: Self-pay | Admitting: *Deleted

## 2022-12-02 NOTE — Telephone Encounter (Signed)
Per Mardella Layman, NP, called pt to make aware that radiology was called about 2018 biopsy and there were no concerns for right breast and things were stable. Pt verbalized understanding and was appreciative.

## 2022-12-08 ENCOUNTER — Telehealth: Payer: Self-pay | Admitting: *Deleted

## 2022-12-08 NOTE — Telephone Encounter (Signed)
Per Lillard Anes, NP, called pt with message below. Went over recommendations that can aid in joint pain to bilateral hips. Pt verbalized understanding and appreciative.

## 2022-12-08 NOTE — Telephone Encounter (Signed)
-----   Message from Loa Socks, NP sent at 12/08/2022  8:31 AM EDT ----- Please let patient know her xrays show arthritis.   ----- Message ----- From: Interface, Rad Results In Sent: 12/05/2022   5:12 PM EDT To: Loa Socks, NP

## 2022-12-18 ENCOUNTER — Telehealth: Payer: Self-pay

## 2022-12-18 NOTE — Telephone Encounter (Signed)
Patient notified that her ozempic has been delivered to the office and she can come pick it up. YL,RMA 

## 2022-12-30 ENCOUNTER — Telehealth: Payer: Self-pay

## 2022-12-30 NOTE — Progress Notes (Cosign Needed Addendum)
As of 12/26/2022 patient picked up 5 boxes of Ozempic 0.25/0.5 mg from PCP office, Novo Nordisk patient assistance.  Novo Nordisk patient assistance program notification:  120- day supply of Ozempic will be fulfilled on 02/20/2023. Patient enrollment will expire on 07/21/2023.    Billee Cashing, CMA Clinical Pharmacist Assistant [phone]

## 2023-01-22 ENCOUNTER — Other Ambulatory Visit: Payer: Self-pay | Admitting: Internal Medicine

## 2023-01-26 ENCOUNTER — Ambulatory Visit (INDEPENDENT_AMBULATORY_CARE_PROVIDER_SITE_OTHER): Payer: Medicare Other | Admitting: Internal Medicine

## 2023-01-26 ENCOUNTER — Encounter: Payer: Self-pay | Admitting: Internal Medicine

## 2023-01-26 VITALS — BP 110/70 | HR 72 | Temp 98.0°F | Ht 60.0 in | Wt 149.4 lb

## 2023-01-26 DIAGNOSIS — M160 Bilateral primary osteoarthritis of hip: Secondary | ICD-10-CM

## 2023-01-26 DIAGNOSIS — I129 Hypertensive chronic kidney disease with stage 1 through stage 4 chronic kidney disease, or unspecified chronic kidney disease: Secondary | ICD-10-CM

## 2023-01-26 DIAGNOSIS — N1831 Chronic kidney disease, stage 3a: Secondary | ICD-10-CM | POA: Diagnosis not present

## 2023-01-26 DIAGNOSIS — G2581 Restless legs syndrome: Secondary | ICD-10-CM | POA: Insufficient documentation

## 2023-01-26 DIAGNOSIS — Z6829 Body mass index (BMI) 29.0-29.9, adult: Secondary | ICD-10-CM

## 2023-01-26 DIAGNOSIS — E663 Overweight: Secondary | ICD-10-CM

## 2023-01-26 DIAGNOSIS — E1122 Type 2 diabetes mellitus with diabetic chronic kidney disease: Secondary | ICD-10-CM

## 2023-01-26 DIAGNOSIS — E7800 Pure hypercholesterolemia, unspecified: Secondary | ICD-10-CM | POA: Diagnosis not present

## 2023-01-26 DIAGNOSIS — I10 Essential (primary) hypertension: Secondary | ICD-10-CM | POA: Insufficient documentation

## 2023-01-26 DIAGNOSIS — Z7985 Long-term (current) use of injectable non-insulin antidiabetic drugs: Secondary | ICD-10-CM

## 2023-01-26 MED ORDER — ATORVASTATIN CALCIUM 40 MG PO TABS: 40.0000 mg | ORAL_TABLET | Freq: Every day | ORAL | 2 refills | Status: DC

## 2023-01-26 MED ORDER — METOPROLOL SUCCINATE ER 25 MG PO TB24: 25.0000 mg | ORAL_TABLET | Freq: Every day | ORAL | 2 refills | Status: DC

## 2023-01-26 MED ORDER — LOSARTAN POTASSIUM 100 MG PO TABS: 100.0000 mg | ORAL_TABLET | Freq: Every day | ORAL | 2 refills | Status: DC

## 2023-01-26 MED ORDER — HYDROCHLOROTHIAZIDE 25 MG PO TABS: 25.0000 mg | ORAL_TABLET | Freq: Every day | ORAL | 2 refills | Status: DC

## 2023-01-26 NOTE — Assessment & Plan Note (Signed)
Chronic, well controlled. She will c/w hydrochlorothiazide 25mg , losartan 100mg  daily and metoprolol XL 25mg  daily. Advised to follow a low sodium diet.

## 2023-01-26 NOTE — Progress Notes (Signed)
I,Victoria T Deloria Lair, CMA,acting as a Neurosurgeon for Gwynneth Aliment, MD.,have documented all relevant documentation on the behalf of Gwynneth Aliment, MD,as directed by  Gwynneth Aliment, MD while in the presence of Gwynneth Aliment, MD.  Subjective:  Patient ID: Martha Pham , female    DOB: [birth year] , 72 y.o.   MRN: [id number]  Chief Complaint  Patient presents with   Diabetes   Hypertension   Hyperlipidemia    HPI  Patient present for a bp, diabetes & cholesterol check. She reports compliance with meds. She denies headaches, chest pain and shortness of breath.  She states her sugars are running 100-117.   She adds, she was recently diagnosed with osteoarthritis. She complained of bilateral hip pain to her oncologist, x-ray was performed. Xray revealed diagnosis of OA. She is not established with Orthopedic, would like referral.  She c/o  b/l groin pain. bilaterally. There is some pain witih ambulation. Feels stiff after being seated for long periods of time.      Hypertension This is a chronic problem. The current episode started more than 1 year ago. The problem has been gradually improving since onset. The problem is controlled. Risk factors for coronary artery disease include diabetes mellitus, dyslipidemia, post-menopausal state, obesity and sedentary lifestyle. The current treatment provides moderate improvement. Compliance problems include exercise.   Diabetes She presents for her follow-up diabetic visit. She has type 2 diabetes mellitus. Her disease course has been improving. There are no hypoglycemic associated symptoms. Pertinent negatives for diabetes include no polydipsia, no polyphagia and no polyuria. There are no hypoglycemic complications.     Past Medical History:  Diagnosis Date   Apnea, sleep    does wear cpap   Arthritis    Cancer (HCC)    lt. breast ca-snbx   Chronic kidney disease    Contact lens/glasses fitting    wears contacts or glasses   COPD (chronic  obstructive pulmonary disease) (HCC)    brother   Heart murmur    Hypercholesteremia    Hyperlipemia    Hypertension    Lower back pain    Lung cancer (HCC)    Malaise and fatigue    Personal history of chemotherapy 2013   Sleep apnea    SLEEP STUDY DX 10,CPAP BUT DON'TUSE   Vitamin D deficiency    Wears dentures    upper     Family History  Problem Relation Age of Onset   Heart attack Mother    Brain cancer Father    Breast cancer Sister 49   Breast cancer Sister 72   Sleep apnea Neg Hx      Current Outpatient Medications:    acetaminophen (TYLENOL) 500 MG tablet, Take 1,000 mg by mouth every 6 (six) hours as needed for moderate pain. , Disp: , Rfl:    Ascorbic Acid (VITAMIN C) 1000 MG tablet, Take 1,000 mg by mouth daily., Disp: , Rfl:    Blood Glucose Monitoring Suppl (ONETOUCH VERIO) w/Device KIT, USE AS DIRECTED TO CHECK BLOOD SUGARS, Disp: 1 kit, Rfl: 1   calcium carbonate (OS-CAL) 600 MG TABS, Take 600 mg by mouth 2 (two) times daily with a meal.  , Disp: , Rfl:    Carboxymethylcellulose Sodium (REFRESH OP), Apply 1 drop to eye 2 (two) times daily as needed. For dry eyes, Disp: , Rfl:    Cyanocobalamin (VITAMIN B-12) 2500 MCG SUBL, Place 1 tablet under the tongue daily., Disp: , Rfl:  gabapentin (NEURONTIN) 100 MG capsule, Take 1 pill at bedtime as needed for leg pain., Disp: 90 capsule, Rfl: 1   glucose blood (CONTOUR NEXT TEST) test strip, USE AS DIRECTED TO  CHECK  BLOOD  SUGARS  ONCE  A  DAY, Disp: 100 each, Rfl: 0   glucose blood (ONETOUCH VERIO) test strip, Use as instructed, Disp: 100 each, Rfl: 12   loratadine (CLARITIN) 10 MG tablet, Take 1 tablet (10 mg total) by mouth daily., Disp: 30 tablet, Rfl: 2   Magnesium 400 MG CAPS, Take by mouth daily. , Disp: , Rfl:    Microlet Lancets MISC, USE AS DIRECTED TO  CHECK  BLOOD  SUGARS  ONE  TIME  A  DAY, Disp: 100 each, Rfl: 0   OneTouch Delica Lancets 30G MISC, USE ONCE DAILY TO CHECK BLOOD SUGAR., Disp: 100 each,  Rfl: 1   Semaglutide,0.25 or 0.5MG /DOS, (OZEMPIC, 0.25 OR 0.5 MG/DOSE,) 2 MG/3ML SOPN, Inject 0.5 mg into the skin once a week., Disp: 9 mL, Rfl: 1   Vitamin D, Cholecalciferol, 50 MCG (2000 UT) CAPS, Take by mouth. , Disp: , Rfl:    atorvastatin (LIPITOR) 40 MG tablet, Take 1 tablet (40 mg total) by mouth daily., Disp: 90 tablet, Rfl: 2   hydrochlorothiazide (HYDRODIURIL) 25 MG tablet, Take 1 tablet (25 mg total) by mouth daily., Disp: 90 tablet, Rfl: 2   losartan (COZAAR) 100 MG tablet, Take 1 tablet (100 mg total) by mouth daily., Disp: 90 tablet, Rfl: 2   metoprolol succinate (TOPROL-XL) 25 MG 24 hr tablet, Take 1 tablet (25 mg total) by mouth daily., Disp: 90 tablet, Rfl: 2   Allergies  Allergen Reactions   Dust Mite Extract Itching   Other     grass     Review of Systems  Constitutional: Negative.   Respiratory: Negative.    Cardiovascular: Negative.   Gastrointestinal: Negative.   Endocrine: Negative for polydipsia, polyphagia and polyuria.  Musculoskeletal:  Positive for arthralgias.  Skin: Negative.   Neurological: Negative.   Psychiatric/Behavioral: Negative.       Today's Vitals   01/26/23 1514  BP: 110/70  Pulse: 72  Temp: 98 F (36.7 C)  SpO2: 98%  Weight: 149 lb 6.4 oz (67.8 kg)  Height: 5' (1.524 m)   Body mass index is 29.18 kg/m.  Wt Readings from Last 3 Encounters:  01/26/23 149 lb 6.4 oz (67.8 kg)  12/01/22 147 lb 9.6 oz (67 kg)  11/24/22 144 lb 9.6 oz (65.6 kg)     Objective:  Physical Exam Vitals and nursing note reviewed.  Constitutional:      Appearance: Normal appearance.  HENT:     Head: Normocephalic and atraumatic.  Eyes:     Extraocular Movements: Extraocular movements intact.  Cardiovascular:     Rate and Rhythm: Normal rate and regular rhythm.     Heart sounds: Normal heart sounds.  Pulmonary:     Effort: Pulmonary effort is normal.     Breath sounds: Normal breath sounds.  Musculoskeletal:     Cervical back: Normal range of  motion.     Comments: Pain with movement.   Skin:    General: Skin is warm.  Neurological:     General: No focal deficit present.     Mental Status: She is alert.  Psychiatric:        Mood and Affect: Mood normal.        Behavior: Behavior normal.  Assessment And Plan:  Type 2 diabetes mellitus with stage 3a chronic kidney disease, without long-term current use of insulin (HCC) Assessment & Plan: Chronic, she will c/w semaglutide 0.5mg  weekly. Importance of dietary/medication compliance was discussed with the patient. She was congratulated on the lifestyle changes she has made thus far. She will f/u in 4 months.   Orders: -     CMP14+EGFR -     Hemoglobin A1c  Essential hypertension Assessment & Plan: Chronic, well controlled. She will c/w hydrochlorothiazide 25mg , losartan 100mg  daily and metoprolol XL 25mg  daily. Advised to follow a low sodium diet.   Orders: -     CMP14+EGFR  Pure hypercholesterolemia Assessment & Plan: Chronic, LDL goal < 70 due to underlying diabetes. She will c/w atorvastatin 40mg  daily. She is encouraged to follow a heart healthy lifestyle.    Primary osteoarthritis of both hips Assessment & Plan: Likely chronic. I will refer her to Ortho for further evaluation. She would likely benefit from PT and/or hip injection. She agrees with treatment plan.   Orders: -     Ambulatory referral to Orthopedic Surgery  Overweight with body mass index (BMI) of 29 to 29.9 in adult Assessment & Plan: She has gained 5 lbs since May. Encouraged to aim for at least 150 minutes of exercise/week, while striving to maintain her weight less than 145.    Other orders -     Atorvastatin Calcium; Take 1 tablet (40 mg total) by mouth daily.  Dispense: 90 tablet; Refill: 2 -     hydroCHLOROthiazide; Take 1 tablet (25 mg total) by mouth daily.  Dispense: 90 tablet; Refill: 2 -     Losartan Potassium; Take 1 tablet (100 mg total) by mouth daily.  Dispense: 90 tablet;  Refill: 2 -     Metoprolol Succinate ER; Take 1 tablet (25 mg total) by mouth daily.  Dispense: 90 tablet; Refill: 2     Return in 4 months (on 05/29/2023) for 4 month bp & dm .  Patient was given opportunity to ask questions. Patient verbalized understanding of the plan and was able to repeat key elements of the plan. All questions were answered to their satisfaction.   I, Gwynneth Aliment, MD, have reviewed all documentation for this visit. The documentation on 01/26/23 for the exam, diagnosis, procedures, and orders are all accurate and complete.  IF YOU HAVE BEEN REFERRED TO A SPECIALIST, IT MAY TAKE 1-2 WEEKS TO SCHEDULE/PROCESS THE REFERRAL. IF YOU HAVE NOT HEARD FROM US/SPECIALIST IN TWO WEEKS, PLEASE GIVE [place] A CALL AT [phone].   THE PATIENT IS ENCOURAGED TO PRACTICE SOCIAL DISTANCING DUE TO THE COVID-19 PANDEMIC.

## 2023-01-26 NOTE — Assessment & Plan Note (Signed)
Chronic, LDL goal < 70 due to underlying diabetes. She will c/w atorvastatin 40mg  daily. She is encouraged to follow a heart healthy lifestyle.

## 2023-01-26 NOTE — Assessment & Plan Note (Signed)
Likely chronic. I will refer her to Ortho for further evaluation. She would likely benefit from PT and/or hip injection. She agrees with treatment plan.

## 2023-01-26 NOTE — Assessment & Plan Note (Signed)
She has gained 5 lbs since May. Encouraged to aim for at least 150 minutes of exercise/week, while striving to maintain her weight less than 145.

## 2023-01-26 NOTE — Assessment & Plan Note (Signed)
Chronic, she will c/w semaglutide 0.5mg  weekly. Importance of dietary/medication compliance was discussed with the patient. She was congratulated on the lifestyle changes she has made thus far. She will f/u in 4 months.

## 2023-01-26 NOTE — Patient Instructions (Signed)

## 2023-01-27 ENCOUNTER — Encounter: Payer: Self-pay | Admitting: Orthopaedic Surgery

## 2023-01-27 ENCOUNTER — Ambulatory Visit: Payer: Medicare Other | Admitting: Orthopaedic Surgery

## 2023-01-27 DIAGNOSIS — M1612 Unilateral primary osteoarthritis, left hip: Secondary | ICD-10-CM

## 2023-01-27 DIAGNOSIS — M1611 Unilateral primary osteoarthritis, right hip: Secondary | ICD-10-CM

## 2023-01-27 LAB — CMP14+EGFR
ALT: 12 IU/L (ref 0–32)
AST: 15 IU/L (ref 0–40)
Albumin: 4.2 g/dL (ref 3.8–4.8)
Alkaline Phosphatase: 69 IU/L (ref 44–121)
BUN/Creatinine Ratio: 19 (ref 12–28)
BUN: 21 mg/dL (ref 8–27)
Bilirubin Total: 0.2 mg/dL (ref 0.0–1.2)
CO2: 24 mmol/L (ref 20–29)
Calcium: 10.3 mg/dL (ref 8.7–10.3)
Chloride: 104 mmol/L (ref 96–106)
Creatinine, Ser: 1.12 mg/dL — ABNORMAL HIGH (ref 0.57–1.00)
Globulin, Total: 3.1 g/dL (ref 1.5–4.5)
Glucose: 101 mg/dL — ABNORMAL HIGH (ref 70–99)
Potassium: 3.5 mmol/L (ref 3.5–5.2)
Sodium: 142 mmol/L (ref 134–144)
Total Protein: 7.3 g/dL (ref 6.0–8.5)
eGFR: 52 mL/min/{1.73_m2} — ABNORMAL LOW (ref >59)

## 2023-01-27 LAB — HEMOGLOBIN A1C
Est. average glucose Bld gHb Est-mCnc: 108 mg/dL
Hgb A1c MFr Bld: 5.4 % (ref 4.8–5.6)

## 2023-01-27 NOTE — Progress Notes (Signed)
Office Visit Note   Patient: Martha Pham           Date of Birth: 1950/09/07           MRN: [id number] Visit Date: 01/27/2023              Requested by: Martha Peng, MD [address] [place],  [place] [address] PCP: Martha Peng, MD   Assessment & Plan: Visit Diagnoses:  1. Primary osteoarthritis of left hip   2. Primary osteoarthritis of right hip     Plan: Impression is severe bilateral hip degenerative joint disease secondary to Osteoarthritis.  Imaging shows bone on bone joint space narrowing.  At this point, conservative treatments fail to provide any significant relief and the pain is severely affecting ADLs and quality of life.  Treatment options were discussed to include medications, activity modifications, cortisone injections, physical therapy.  We have discussed the surgical risks that include but are not limited to infection, DVT, leg length discrepancy, numbness, tingling, incomplete relief of pain.  Recovery and prognosis were also reviewed.    Patient has upcoming cruise in January that she has already paid for so she will have to get her scheduled determine when she would want to have hip replacement.  Currently her right hip is more symptomatic.  She will call us back when she is ready to schedule surgery.  In the meantime she may make an appointment with Dr. Shon Pham for hip injections.  Total hip replacement packet provided.  Current anticoagulants: No antithrombotic Postop anticoagulation: Aspirin 81 mg Diabetic: Yes  A1c 5.4 Prior DVT/PE: No Tobacco use: No Clearances needed for surgery: Dr. Allyne Pham Anticipate discharge dispo: home   Follow-Up Instructions: No follow-ups on file.   Orders:  No orders of the defined types were placed in this encounter.  No orders of the defined types were placed in this encounter.     Procedures: No procedures performed   Clinical Data: No additional findings.   Subjective: Chief Complaint   Patient presents with   Right Hip - Pain   Left Hip - Pain    HPI Martha Pham is a very pleasant 72 year old female here with 2 years of bilateral hip pain worse on the right.  This has progressively gotten worse and has started to affect daily activities and sleeping.  Currently use Voltaren gel and Tylenol without significant improvement.  She is retired.  Lives with her husband.  Review of Systems  Constitutional: Negative.   HENT: Negative.    Eyes: Negative.   Respiratory: Negative.    Cardiovascular: Negative.   Endocrine: Negative.   Musculoskeletal: Negative.   Neurological: Negative.   Hematological: Negative.   Psychiatric/Behavioral: Negative.    All other systems reviewed and are negative.    Objective: Vital Signs: There were no vitals taken for this visit.  Physical Exam Vitals and nursing note reviewed.  Constitutional:      Appearance: She is well-developed.  HENT:     Head: Atraumatic.     Nose: Nose normal.  Eyes:     Extraocular Movements: Extraocular movements intact.  Cardiovascular:     Pulses: Normal pulses.  Pulmonary:     Effort: Pulmonary effort is normal.  Abdominal:     Palpations: Abdomen is soft.  Musculoskeletal:     Cervical back: Neck supple.  Skin:    General: Skin is warm.     Capillary Refill: Capillary refill takes less than 2 seconds.  Neurological:  Mental Status: She is alert. Mental status is at baseline.  Psychiatric:        Behavior: Behavior normal.        Thought Content: Thought content normal.        Judgment: Judgment normal.     Right Hip Exam   Tenderness  The patient is experiencing no tenderness.   Range of Motion  Abduction:  abnormal  Adduction:  abnormal  Extension:  abnormal  External rotation:  abnormal  Internal rotation:  abnormal   Other  Erythema: absent Scars: absent Sensation: normal Pulse: present   Left Hip Exam   Tenderness  The patient is experiencing no tenderness.   Range  of Motion  Abduction:  abnormal  Adduction:  abnormal  Extension:  abnormal  External rotation:  abnormal  Internal rotation: abnormal   Other  Erythema: absent Scars: absent Sensation: normal Pulse: present     Specialty Comments:  No specialty comments available.  Imaging: No results found.   PMFS History: Patient Active Problem List   Diagnosis Date Noted   Essential hypertension 01/26/2023   Primary osteoarthritis of both hips 01/26/2023   RLS (restless legs syndrome) 01/26/2023   Pure hypercholesterolemia 04/06/2022   Palpitations 04/06/2022   Personal history of COVID-19 04/06/2022   Mixed hyperlipidemia 06/06/2021   Class 1 obesity due to excess calories with serious comorbidity and body mass index (BMI) of 33.0 to 33.9 in adult 06/06/2021   Keloid 06/06/2021   History of breast cancer 11/23/2020   Type 2 diabetes mellitus with stage 3a chronic kidney disease, without long-term current use of insulin (HCC) 06/22/2018   Chronic renal disease, stage II 06/22/2018   Parenchymal renal hypertension 06/22/2018   Overweight with body mass index (BMI) of 29 to 29.9 in adult 06/22/2018   Pain in joint, pelvic region and thigh 03/28/2014   Acquired absence of breast and absent nipple 05/05/2012   Past Medical History:  Diagnosis Date   Apnea, sleep    does wear cpap   Arthritis    Cancer (HCC)    lt. breast ca-snbx   Chronic kidney disease    Contact lens/glasses fitting    wears contacts or glasses   COPD (chronic obstructive pulmonary disease) (HCC)    brother   Heart murmur    Hypercholesteremia    Hyperlipemia    Hypertension    Lower back pain    Lung cancer (HCC)    Malaise and fatigue    Personal history of chemotherapy 2013   Sleep apnea    SLEEP STUDY DX 10,CPAP BUT DON'TUSE   Vitamin D deficiency    Wears dentures    upper    Family History  Problem Relation Age of Onset   Heart attack Mother    Brain cancer Father    Breast cancer  Sister 52   Breast cancer Sister 26   Sleep apnea Neg Hx     Past Surgical History:  Procedure Laterality Date   BREAST BIOPSY Left 05/26/2011   malignant   BREAST BIOPSY Right 10/29/2016   BREAST IMPLANT EXCHANGE Left 09/23/2012   Procedure: REMOVAL OF LEFT EXPANDER/PLACEMENT OF IMPLANT WITH REDUCTION OF RIGHT BREAST;  Surgeon: Wayland Denis, DO;  Location: [facility];  Service: Plastics;  Laterality: Left;   BREAST LUMPECTOMY W/ NEEDLE LOCALIZATION  06/24/2011   left   BREAST REDUCTION SURGERY Right 09/23/2012   Procedure: MAMMARY REDUCTION  (BREAST);  Surgeon: Wayland Denis, DO;  Location: [facility];  Service: Plastics;  Laterality: Right;   BREAST SURGERY  08/07/2011   re-exc. left breast margins   CATARACT EXTRACTION Bilateral    january and february 2022   CYST REMOVAL NECK  08/2013   Dr. Kelly Splinter   LIPOSUCTION Bilateral 09/23/2012   Procedure: LIPOSUCTION;  Surgeon: Wayland Denis, DO;  Location: [facility];  Service: Plastics;  Laterality: Bilateral;   MASTECTOMY Left 2012   MOUTH SURGERY     TOOTH IMPLANT BOTTOM X2 LFT   PORT-A-CATH REMOVAL Right 09/23/2012   Procedure: REMOVAL PORT-A-CATH;  Surgeon: Wayland Denis, DO;  Location: [facility];  Service: Plastics;  Laterality: Right;   PORTACATH PLACEMENT  06/24/2011   Procedure: INSERTION PORT-A-CATH;  Surgeon: Rulon Abide, DO;  Location: [facility];  Service: General;  Laterality: Right;  Right mediport placement   RECONSTRUCTION BREAST IMMEDIATE / DELAYED W/ TISSUE EXPANDER  04/2012   left   REDUCTION MAMMAPLASTY Right 2013   SIMPLE MASTECTOMY  09/01/2011   left   TONSILLECTOMY     tooth implant removed     10/2021   Social History   Occupational History   Occupation: Retired  Tobacco Use   Smoking status: Never   Smokeless tobacco: Never  Vaping Use   Vaping Use: Never used  Substance and Sexual Activity   Alcohol use: Not  Currently    Alcohol/week: 2.0 standard drinks of alcohol    Types: 2 Glasses of wine per week    Comment: 8 oz occasionally per week   Drug use: No   Sexual activity: Not Currently    Partners: Male    Birth control/protection: Post-menopausal

## 2023-02-03 ENCOUNTER — Telehealth: Payer: Medicare Other

## 2023-02-03 ENCOUNTER — Telehealth: Payer: Self-pay | Admitting: *Deleted

## 2023-02-03 NOTE — Telephone Encounter (Signed)
   CCM RN Visit Note   02/03/23 Name: AIRIANNA KREISCHER MRN: [id number]      DOB: [birth year]  Subjective: CHAISE PASSARELLA is a 72 y.o. year old female who is a primary care patient of Dorothyann Peng MD. The patient was referred to the Chronic Care Management team for assistance with care management needs subsequent to provider initiation of CCM services and plan of care.      An unsuccessful telephone outreach was attempted today to contact the patient about Chronic Care Management needs.    Plan:Telephone follow up appointment with care management team member scheduled for:  upon care guide rescheduling  Irving Shows [facility], BSN RN Case Manager [facility] [phone]

## 2023-03-19 ENCOUNTER — Telehealth: Payer: Self-pay | Admitting: Internal Medicine

## 2023-03-19 NOTE — Telephone Encounter (Signed)
Patient was called to let her know that her Ozempic was ready for pick up. Pt aware and will pick up one day next week.

## 2023-03-24 ENCOUNTER — Other Ambulatory Visit: Payer: Medicare Other | Admitting: *Deleted

## 2023-03-31 ENCOUNTER — Encounter: Payer: Self-pay | Admitting: *Deleted

## 2023-03-31 ENCOUNTER — Other Ambulatory Visit: Payer: Medicare Other | Admitting: *Deleted

## 2023-03-31 NOTE — Patient Instructions (Signed)
Visit Information  Thank you for taking time to visit with me today. Please don't hesitate to contact me if I can be of assistance to you before our next scheduled telephone appointment.  Following are the goals we discussed today:   Goals Addressed             This Visit's Progress    COMPLETED: CCM (DIABETES) EXPECTED OUTCOME: MONITOR, SELF-MANAGE AND REDUCE SYMPTOMS OF DIABETES       Current Barriers:  Knowledge Deficits related to Diabetes management Chronic Disease Management support and education needs related to Diabetes, diet No Advanced Directives in place- documents to be mailed Patient reports she checks blood sugar once daily with fasting readings 99-119 Patient reports she tries to follow carbohydrate modified diet Patient reports she is walking 7 days per week No new concerns reported  Planned Interventions: Reviewed medications with patient and discussed importance of medication adherence;        Counseled on importance of regular laboratory monitoring as prescribed;        Advised patient, providing education and rationale, to check cbg once daily and record        call provider for findings outside established parameters;       Review of patient status, including review of consultants reports, relevant laboratory and other test results, and medications completed;       Reinforced carbohydrate modified diet Reviewed plan of care including case closure  Symptom Management: Take medications as prescribed   Attend all scheduled provider appointments Call pharmacy for medication refills 3-7 days in advance of running out of medications Attend church or other social activities Perform all self care activities independently  Perform IADL's (shopping, preparing meals, housekeeping, managing finances) independently Call provider office for new concerns or questions  check blood sugar at prescribed times: once daily check feet daily for cuts, sores or redness enter  blood sugar readings and medication or insulin into daily log take the blood sugar log to all doctor visits take the blood sugar meter to all doctor visits set goal weight trim toenails straight across fill half of plate with vegetables limit fast food meals to no more than 1 per week manage portion size prepare main meal at home 3 to 5 days each week read food labels for fat, fiber, carbohydrates and portion size keep feet up while sitting wash and dry feet carefully every day Follow carbohydrate modified diet/ plate method Continue walking- keep up the good work! Case closure  Follow Up Plan: No further follow up required: Case closure         COMPLETED: CCM (HYPERTENSION) EXPECTED OUTCOME: MONITOR, SELF-MANAGE AND REDUCE SYMPTOMS OF HYPERTENSION       Current Barriers:  Knowledge Deficits related to Hypertension management Chronic Disease Management support and education needs related to Hypertension, diet, exercise No Advanced Directives in place- documents previously mailed Patient reports she lives alone, granddaughter comes by and stays at times and checks in on pt,  pt states she can call on her daughter if needed Patient reports she is independent in all aspects of her care, continues to drive, checks blood pressure several times per week  No new concerns reported  Planned Interventions: Evaluation of current treatment plan related to hypertension self management and patient's adherence to plan as established by provider;   Reviewed medications with patient and discussed importance of compliance;  Counseled on the importance of exercise goals with target of 150 minutes per week Discussed plans with patient  for ongoing care management follow up and provided patient with direct contact information for care management team; Advised patient, providing education and rationale, to monitor blood pressure daily and record, calling PCP for findings outside established parameters;   Advised patient to discuss any issues with blood pressure, medications with provider; Discussed complications of poorly controlled blood pressure such as heart disease, stroke, circulatory complications, vision complications, kidney impairment, sexual dysfunction;  Reinforced low sodium diet Pain assessment completed  Symptom Management: Take medications as prescribed   Attend all scheduled provider appointments Call pharmacy for medication refills 3-7 days in advance of running out of medications Attend church or other social activities Perform all self care activities independently  Perform IADL's (shopping, preparing meals, housekeeping, managing finances) independently Call provider office for new concerns or questions  check blood pressure 3 times per week choose a place to take my blood pressure (home, clinic or office, retail store) write blood pressure results in a log or diary learn about high blood pressure keep a blood pressure log take blood pressure log to all doctor appointments call doctor for signs and symptoms of high blood pressure keep all doctor appointments take medications for blood pressure exactly as prescribed report new symptoms to your doctor eat more whole grains, fruits and vegetables, lean meats and healthy fats Follow low sodium diet- read food labels Limit fast food  Follow Up Plan: No further follow up required: Case closure              Chronic Pain, Adult Chronic pain is a type of pain that lasts or keeps coming back for at least 3-6 months. You may have headaches, pain in the abdomen, or pain in other areas of the body. Chronic pain may be related to an illness, injury, or a health condition. Sometimes, the cause of chronic pain is not known. Chronic pain can make it hard for you to do daily activities. If it is not treated, chronic pain can lead to anxiety and depression. Treatment depends on the cause of your pain and how severe it is. You  may need to work with a pain specialist to come up with a treatment plan. Many people benefit from two or more types of treatment to control their pain. Follow these instructions at home: Treatment plan Follow your treatment plan as told by your health care provider. This may include: Gentle, regular exercise. Eating a healthy diet that includes foods such as vegetables, fruits, fish, and lean meats. Mental health therapy (cognitive or behavioral therapy) that changes the way you think or act in response to the pain. This may help improve how you feel. Doing physical therapy exercises to improve movement and strength. Meditation, yoga, acupuncture, or massage therapy. Using the oils from plants in your environment or on your skin (aromatherapy). Other treatments may include: Over-the-counter or prescription medicines. Color, light, or sound therapy. Local electrical stimulation. The electrical pulses help to relieve pain by temporarily stopping the nerve impulses that cause you to feel pain. Injections. These deliver numbing or pain-relieving medicines into the spine or the area of pain.  Medicines Take over-the-counter and prescription medicines only as told by your health care provider. Ask your health care provider if the medicine prescribed to you: Requires you to avoid driving or using machinery. Can cause constipation. You may need to take these actions to prevent or treat constipation: Drink enough fluid to keep your urine pale yellow. Take over-the-counter or prescription medicines. Eat foods that are high  in fiber, such as beans, whole grains, and fresh fruits and vegetables. Limit foods that are high in fat and processed sugars, such as fried or sweet foods. Lifestyle  Ask your health care provider whether you should keep a pain diary. Your health care provider will tell you what information to write in the diary. This may include: When you have pain. What the pain feels  like. How medicines and other behaviors or treatments help to reduce the pain. Consider talking with a mental health care provider about how to help manage chronic pain. Consider joining a chronic pain support group. Try to control or lower your stress levels. Talk with your health care provider about ways to do this. General instructions Learn as much as you can about how to manage your chronic pain. Ask your health care provider if an intensive pain rehabilitation program or a chronic pain specialist would be helpful. Check your pain level as told by your health care provider. Ask your health care provider if you should use a pain scale. Contact a health care provider if: Your pain is not controlled with treatment. You have new pain. You have side effects from pain medicine. You feel weak or you have trouble doing your normal activities. You have trouble sleeping or you develop confusion. You lose feeling or have numbness in your body. You lose control of your bowels or bladder. Get help right away if: Your pain suddenly gets much worse. You develop chest pain. You have trouble breathing or shortness of breath. You faint, or another person sees you faint. These symptoms may be an emergency. Get help right away. Call 911. Do not wait to see if the symptoms will go away. Do not drive yourself to the hospital. Also, get help right away if: You have thoughts about hurting yourself or others. Take one of these steps if you feel like you may hurt yourself or others, or have thoughts about taking your own life: Go to your nearest emergency room. Call 911. Call the National Suicide Prevention Lifeline at 5867282083 or 988. This is open 24 hours a day. Text the Crisis Text Line at 516-741-7100. This information is not intended to replace advice given to you by your health care provider. Make sure you discuss any questions you have with your health care provider. Document Revised: 02/26/2022  Document Reviewed: 01/29/2022 Elsevier Patient Education  2024 [employer]   Please call the care guide team at [phone] if you need to cancel or reschedule your appointment.   If you are experiencing a Mental Health or Behavioral Health Crisis or need someone to talk to, please call the Suicide and Crisis Lifeline: 988 call the [place] National Suicide Prevention Lifeline: 408-761-4685 or TTY: (318) 341-7567 TTY 6280746771) to talk to a trained counselor call 1-800-273-TALK (toll free, 24 hour hotline) go to [facility] [address] [place] [phone]) call 911   Patient verbalizes understanding of instructions and care plan provided today and agrees to view in MyChart. Active MyChart status and patient understanding of how to access instructions and care plan via MyChart confirmed with patient.     No further follow up required: case closure  Irving Shows [facility], BSN [place]/ Ambulatory Care Management [phone]

## 2023-03-31 NOTE — Patient Outreach (Signed)
Care Management   Visit Note  03/31/2023 Name: Martha Pham MRN: [id number] DOB: 19-Nov-1950  Subjective: Martha Pham is a 72 y.o. year old female who is a primary care patient of Dorothyann Peng, MD. The Care Management team was consulted for assistance.      Engaged with patient spoke with patient by telephone for follow up   Goals Addressed             This Visit's Progress    COMPLETED: CCM (DIABETES) EXPECTED OUTCOME: MONITOR, SELF-MANAGE AND REDUCE SYMPTOMS OF DIABETES       Current Barriers:  Knowledge Deficits related to Diabetes management Chronic Disease Management support and education needs related to Diabetes, diet No Advanced Directives in place- documents to be mailed Patient reports she checks blood sugar once daily with fasting readings 99-119 Patient reports she tries to follow carbohydrate modified diet Patient reports she is walking 7 days per week No new concerns reported  Planned Interventions: Reviewed medications with patient and discussed importance of medication adherence;        Counseled on importance of regular laboratory monitoring as prescribed;        Advised patient, providing education and rationale, to check cbg once daily and record        call provider for findings outside established parameters;       Review of patient status, including review of consultants reports, relevant laboratory and other test results, and medications completed;       Reinforced carbohydrate modified diet Reviewed plan of care including case closure  Symptom Management: Take medications as prescribed   Attend all scheduled provider appointments Call pharmacy for medication refills 3-7 days in advance of running out of medications Attend church or other social activities Perform all self care activities independently  Perform IADL's (shopping, preparing meals, housekeeping, managing finances) independently Call provider office for new concerns or questions   check blood sugar at prescribed times: once daily check feet daily for cuts, sores or redness enter blood sugar readings and medication or insulin into daily log take the blood sugar log to all doctor visits take the blood sugar meter to all doctor visits set goal weight trim toenails straight across fill half of plate with vegetables limit fast food meals to no more than 1 per week manage portion size prepare main meal at home 3 to 5 days each week read food labels for fat, fiber, carbohydrates and portion size keep feet up while sitting wash and dry feet carefully every day Follow carbohydrate modified diet/ plate method Continue walking- keep up the good work! Case closure  Follow Up Plan: No further follow up required: Case closure         COMPLETED: CCM (HYPERTENSION) EXPECTED OUTCOME: MONITOR, SELF-MANAGE AND REDUCE SYMPTOMS OF HYPERTENSION       Current Barriers:  Knowledge Deficits related to Hypertension management Chronic Disease Management support and education needs related to Hypertension, diet, exercise No Advanced Directives in place- documents previously mailed Patient reports she lives alone, granddaughter comes by and stays at times and checks in on pt,  pt states she can call on her daughter if needed Patient reports she is independent in all aspects of her care, continues to drive, checks blood pressure several times per week  No new concerns reported  Planned Interventions: Evaluation of current treatment plan related to hypertension self management and patient's adherence to plan as established by provider;   Reviewed medications with patient and discussed importance  of compliance;  Counseled on the importance of exercise goals with target of 150 minutes per week Discussed plans with patient for ongoing care management follow up and provided patient with direct contact information for care management team; Advised patient, providing education and rationale,  to monitor blood pressure daily and record, calling PCP for findings outside established parameters;  Advised patient to discuss any issues with blood pressure, medications with provider; Discussed complications of poorly controlled blood pressure such as heart disease, stroke, circulatory complications, vision complications, kidney impairment, sexual dysfunction;  Reinforced low sodium diet Pain assessment completed  Symptom Management: Take medications as prescribed   Attend all scheduled provider appointments Call pharmacy for medication refills 3-7 days in advance of running out of medications Attend church or other social activities Perform all self care activities independently  Perform IADL's (shopping, preparing meals, housekeeping, managing finances) independently Call provider office for new concerns or questions  check blood pressure 3 times per week choose a place to take my blood pressure (home, clinic or office, retail store) write blood pressure results in a log or diary learn about high blood pressure keep a blood pressure log take blood pressure log to all doctor appointments call doctor for signs and symptoms of high blood pressure keep all doctor appointments take medications for blood pressure exactly as prescribed report new symptoms to your doctor eat more whole grains, fruits and vegetables, lean meats and healthy fats Follow low sodium diet- read food labels Limit fast food  Follow Up Plan: No further follow up required: Case closure               Plan: No further follow up required: case closure  Irving Shows [facility], BSN [place]/ Ambulatory Care Management [phone]

## 2023-04-14 ENCOUNTER — Telehealth: Payer: Self-pay | Admitting: Internal Medicine

## 2023-04-14 NOTE — Telephone Encounter (Signed)
PICK UP 03/26/2023 OZEMPIC 0.25 1X3ML QUANTITY 5

## 2023-05-28 ENCOUNTER — Ambulatory Visit: Payer: Medicare Other | Admitting: Internal Medicine

## 2023-05-28 ENCOUNTER — Encounter: Payer: Self-pay | Admitting: Internal Medicine

## 2023-05-28 VITALS — BP 118/78 | HR 84 | Temp 98.1°F | Ht 60.0 in | Wt 150.8 lb

## 2023-05-28 DIAGNOSIS — E7800 Pure hypercholesterolemia, unspecified: Secondary | ICD-10-CM

## 2023-05-28 DIAGNOSIS — N1831 Chronic kidney disease, stage 3a: Secondary | ICD-10-CM

## 2023-05-28 DIAGNOSIS — M160 Bilateral primary osteoarthritis of hip: Secondary | ICD-10-CM | POA: Diagnosis not present

## 2023-05-28 DIAGNOSIS — G2581 Restless legs syndrome: Secondary | ICD-10-CM | POA: Diagnosis not present

## 2023-05-28 DIAGNOSIS — I129 Hypertensive chronic kidney disease with stage 1 through stage 4 chronic kidney disease, or unspecified chronic kidney disease: Secondary | ICD-10-CM

## 2023-05-28 DIAGNOSIS — E1122 Type 2 diabetes mellitus with diabetic chronic kidney disease: Secondary | ICD-10-CM | POA: Diagnosis not present

## 2023-05-28 DIAGNOSIS — Z23 Encounter for immunization: Secondary | ICD-10-CM | POA: Diagnosis not present

## 2023-05-28 DIAGNOSIS — I10 Essential (primary) hypertension: Secondary | ICD-10-CM

## 2023-05-28 DIAGNOSIS — E2839 Other primary ovarian failure: Secondary | ICD-10-CM

## 2023-05-28 MED ORDER — GABAPENTIN 100 MG PO CAPS
ORAL_CAPSULE | ORAL | 1 refills | Status: AC
Start: 2023-05-28 — End: ?

## 2023-05-28 MED ORDER — LORATADINE 10 MG PO TABS: 10.0000 mg | ORAL_TABLET | Freq: Every day | ORAL | 2 refills | Status: AC

## 2023-05-28 MED ORDER — METOPROLOL SUCCINATE ER 25 MG PO TB24: 25.0000 mg | ORAL_TABLET | Freq: Every day | ORAL | 2 refills | Status: DC

## 2023-05-28 MED ORDER — ONETOUCH DELICA LANCETS 30G MISC: 1 refills | Status: AC

## 2023-05-28 MED ORDER — ATORVASTATIN CALCIUM 40 MG PO TABS: 40.0000 mg | ORAL_TABLET | Freq: Every day | ORAL | 2 refills | Status: DC

## 2023-05-28 NOTE — Assessment & Plan Note (Signed)
Chronic, she will c/w semaglutide 0.5mg  weekly. Importance of dietary/medication compliance was discussed with the patient. She was congratulated on the lifestyle changes she has made thus far. She will f/u in 4 months.

## 2023-05-28 NOTE — Assessment & Plan Note (Signed)
Chronic, well controlled. She will c/w hydrochlorothiazide 25mg , losartan 100mg  daily and metoprolol XL 25mg  daily. Advised to follow a low sodium diet.

## 2023-05-28 NOTE — Patient Instructions (Signed)

## 2023-05-28 NOTE — Progress Notes (Signed)
I,Victoria T Deloria Lair, CMA,acting as a Neurosurgeon for Gwynneth Aliment, MD.,have documented all relevant documentation on the behalf of Gwynneth Aliment, MD,as directed by  Gwynneth Aliment, MD while in the presence of Gwynneth Aliment, MD.  Subjective:  Patient ID: Martha Pham , female    DOB: Sep 17, 1950 , 72 y.o.   MRN: [id number]  Chief Complaint  Patient presents with   Diabetes   Hypertension   Hyperlipidemia    HPI  Patient present for a bp, diabetes & cholesterol check. She reports compliance with meds. She denies headaches, chest pain and shortness of breath.   She has brought in patches her sister shipped from [place] for her. She wants to know if it is okay for her to use. She states she has been having a lot of osteoarthritis flare ups.    Hypertension This is a chronic problem. The current episode started more than 1 year ago. The problem has been gradually improving since onset. The problem is controlled. Risk factors for coronary artery disease include diabetes mellitus, dyslipidemia, post-menopausal state, obesity and sedentary lifestyle. The current treatment provides moderate improvement. Compliance problems include exercise.   Diabetes She presents for her follow-up diabetic visit. She has type 2 diabetes mellitus. Her disease course has been improving. There are no hypoglycemic associated symptoms. Pertinent negatives for diabetes include no polydipsia, no polyphagia and no polyuria. There are no hypoglycemic complications.     Past Medical History:  Diagnosis Date   Apnea, sleep    does wear cpap   Arthritis    Cancer (HCC)    lt. breast ca-snbx   Chronic kidney disease    Contact lens/glasses fitting    wears contacts or glasses   COPD (chronic obstructive pulmonary disease) (HCC)    brother   Heart murmur    Hypercholesteremia    Hyperlipemia    Hypertension    Lower back pain    Lung cancer (HCC)    Malaise and fatigue    Personal history of chemotherapy 2013    Sleep apnea    SLEEP STUDY DX 10,CPAP BUT DON'TUSE   Vitamin D deficiency    Wears dentures    upper     Family History  Problem Relation Age of Onset   Heart attack Mother    Brain cancer Father    Breast cancer Sister 30   Breast cancer Sister 35   Sleep apnea Neg Hx      Current Outpatient Medications:    acetaminophen (TYLENOL) 500 MG tablet, Take 1,000 mg by mouth every 6 (six) hours as needed for moderate pain. , Disp: , Rfl:    Ascorbic Acid (VITAMIN C) 1000 MG tablet, Take 1,000 mg by mouth daily., Disp: , Rfl:    Blood Glucose Monitoring Suppl (ONETOUCH VERIO) w/Device KIT, USE AS DIRECTED TO CHECK BLOOD SUGARS, Disp: 1 kit, Rfl: 1   calcium carbonate (OS-CAL) 600 MG TABS, Take 600 mg by mouth 2 (two) times daily with a meal.  , Disp: , Rfl:    Carboxymethylcellulose Sodium (REFRESH OP), Apply 1 drop to eye 2 (two) times daily as needed. For dry eyes, Disp: , Rfl:    Cyanocobalamin (VITAMIN B-12) 2500 MCG SUBL, Place 1 tablet under the tongue daily., Disp: , Rfl:    glucose blood (CONTOUR NEXT TEST) test strip, USE AS DIRECTED TO  CHECK  BLOOD  SUGARS  ONCE  A  DAY, Disp: 100 each, Rfl: 0   glucose blood (  ONETOUCH VERIO) test strip, Use as instructed, Disp: 100 each, Rfl: 12   hydrochlorothiazide (HYDRODIURIL) 25 MG tablet, Take 1 tablet (25 mg total) by mouth daily., Disp: 90 tablet, Rfl: 2   losartan (COZAAR) 100 MG tablet, Take 1 tablet (100 mg total) by mouth daily., Disp: 90 tablet, Rfl: 2   Magnesium 400 MG CAPS, Take by mouth daily. , Disp: , Rfl:    Microlet Lancets MISC, USE AS DIRECTED TO  CHECK  BLOOD  SUGARS  ONE  TIME  A  DAY, Disp: 100 each, Rfl: 0   Semaglutide,0.25 or 0.5MG /DOS, (OZEMPIC, 0.25 OR 0.5 MG/DOSE,) 2 MG/3ML SOPN, Inject 0.5 mg into the skin once a week., Disp: 9 mL, Rfl: 1   Vitamin D, Cholecalciferol, 50 MCG (2000 UT) CAPS, Take by mouth. , Disp: , Rfl:    atorvastatin (LIPITOR) 40 MG tablet, Take 1 tablet (40 mg total) by mouth daily., Disp:  90 tablet, Rfl: 2   gabapentin (NEURONTIN) 100 MG capsule, Take 1 pill at bedtime as needed for leg pain as needed, Disp: 90 capsule, Rfl: 1   loratadine (CLARITIN) 10 MG tablet, Take 1 tablet (10 mg total) by mouth daily. prn, Disp: 30 tablet, Rfl: 2   metoprolol succinate (TOPROL-XL) 25 MG 24 hr tablet, Take 1 tablet (25 mg total) by mouth daily., Disp: 90 tablet, Rfl: 2   OneTouch Delica Lancets 30G MISC, USE ONCE DAILY TO CHECK BLOOD SUGAR., Disp: 100 each, Rfl: 1   Allergies  Allergen Reactions   Dust Mite Extract Itching   Other     grass     Review of Systems  Constitutional: Negative.   Respiratory: Negative.    Cardiovascular: Negative.   Gastrointestinal: Negative.   Endocrine: Negative for polydipsia, polyphagia and polyuria.  Musculoskeletal:  Positive for arthralgias.  Neurological: Negative.   Psychiatric/Behavioral: Negative.       Today's Vitals   05/28/23 1536  BP: 118/78  Pulse: 84  Temp: 98.1 F (36.7 C)  SpO2: 98%  Weight: 150 lb 12.8 oz (68.4 kg)  Height: 5' (1.524 m)   Body mass index is 29.45 kg/m.  Wt Readings from Last 3 Encounters:  05/28/23 150 lb 12.8 oz (68.4 kg)  01/26/23 149 lb 6.4 oz (67.8 kg)  12/01/22 147 lb 9.6 oz (67 kg)     Objective:  Physical Exam Vitals and nursing note reviewed.  Constitutional:      Appearance: Normal appearance.  HENT:     Head: Normocephalic and atraumatic.  Eyes:     Extraocular Movements: Extraocular movements intact.  Cardiovascular:     Rate and Rhythm: Normal rate and regular rhythm.     Heart sounds: Normal heart sounds.  Pulmonary:     Effort: Pulmonary effort is normal.     Breath sounds: Normal breath sounds.  Musculoskeletal:     Cervical back: Normal range of motion.  Skin:    General: Skin is warm.  Neurological:     General: No focal deficit present.     Mental Status: She is alert.  Psychiatric:        Mood and Affect: Mood normal.        Behavior: Behavior normal.          Assessment And Plan:  Type 2 diabetes mellitus with stage 3a chronic kidney disease, without long-term current use of insulin (HCC) Assessment & Plan: Chronic, she will c/w semaglutide 0.5mg  weekly. Importance of dietary/medication compliance was discussed with the patient. She was  congratulated on the lifestyle changes she has made thus far. She will f/u in 4 months.   Orders: -     Hemoglobin A1c -     BMP8+eGFR  Essential hypertension Assessment & Plan: Chronic, well controlled. She will c/w hydrochlorothiazide 25mg , losartan 100mg  daily and metoprolol XL 25mg  daily. Advised to follow a low sodium diet.   Orders: -     BMP8+eGFR  Pure hypercholesterolemia Assessment & Plan: Chronic, LDL goal < 70 due to underlying diabetes. She will c/w atorvastatin 40mg  daily. She is encouraged to follow a heart healthy lifestyle.   Orders: -     TSH  RLS (restless legs syndrome) -     Gabapentin; Take 1 pill at bedtime as needed for leg pain as needed  Dispense: 90 capsule; Refill: 1  Estrogen deficiency -     DG Bone Density; Future  Primary osteoarthritis of both hips Assessment & Plan: Chronic, encouraged to follow anti-inflammatory diet. Pt advised that I was not familiar with Lifewave-39 patches; therefore, I cannot confirm if they are effective.    Immunization due -     Flu Vaccine Trivalent High Dose (Fluad)  Other orders -     Atorvastatin Calcium; Take 1 tablet (40 mg total) by mouth daily.  Dispense: 90 tablet; Refill: 2 -     Loratadine; Take 1 tablet (10 mg total) by mouth daily. prn  Dispense: 30 tablet; Refill: 2 -     Metoprolol Succinate ER; Take 1 tablet (25 mg total) by mouth daily.  Dispense: 90 tablet; Refill: 2 -     OneTouch Delica Lancets 30G; USE ONCE DAILY TO CHECK BLOOD SUGAR.  Dispense: 100 each; Refill: 1     Return cancel Jan visit with RS, for Move AWV from Jan to anytime after 06/26/23 on a Fri (phone).  Patient was given opportunity to ask  questions. Patient verbalized understanding of the plan and was able to repeat key elements of the plan. All questions were answered to their satisfaction.    I, Gwynneth Aliment, MD, have reviewed all documentation for this visit. The documentation on 05/28/23 for the exam, diagnosis, procedures, and orders are all accurate and complete.   IF YOU HAVE BEEN REFERRED TO A SPECIALIST, IT MAY TAKE 1-2 WEEKS TO SCHEDULE/PROCESS THE REFERRAL. IF YOU HAVE NOT HEARD FROM US/SPECIALIST IN TWO WEEKS, PLEASE GIVE [place] A CALL AT [phone] X [phone].   THE PATIENT IS ENCOURAGED TO PRACTICE SOCIAL DISTANCING DUE TO THE COVID-19 PANDEMIC.

## 2023-05-28 NOTE — Assessment & Plan Note (Signed)
Chronic, LDL goal < 70 due to underlying diabetes. She will c/w atorvastatin 40mg  daily. She is encouraged to follow a heart healthy lifestyle.

## 2023-05-29 LAB — BMP8+EGFR
BUN/Creatinine Ratio: 16 (ref 12–28)
BUN: 16 mg/dL (ref 8–27)
CO2: 24 mmol/L (ref 20–29)
Calcium: 9.8 mg/dL (ref 8.7–10.3)
Chloride: 102 mmol/L (ref 96–106)
Creatinine, Ser: 1.01 mg/dL — ABNORMAL HIGH (ref 0.57–1.00)
Glucose: 107 mg/dL — ABNORMAL HIGH (ref 70–99)
Potassium: 3.4 mmol/L — ABNORMAL LOW (ref 3.5–5.2)
Sodium: 140 mmol/L (ref 134–144)
eGFR: 58 mL/min/{1.73_m2} — ABNORMAL LOW (ref >59)

## 2023-05-29 LAB — TSH: TSH: 0.83 u[IU]/mL (ref 0.450–4.500)

## 2023-05-29 LAB — HEMOGLOBIN A1C
Est. average glucose Bld gHb Est-mCnc: 117 mg/dL
Hgb A1c MFr Bld: 5.7 % — ABNORMAL HIGH (ref 4.8–5.6)

## 2023-06-02 NOTE — Assessment & Plan Note (Signed)
Chronic, encouraged to follow anti-inflammatory diet. Pt advised that I was not familiar with Lifewave-39 patches; therefore, I cannot confirm if they are effective.

## 2023-06-25 ENCOUNTER — Encounter: Payer: Self-pay | Admitting: Pharmacist

## 2023-06-25 NOTE — Progress Notes (Signed)
Pharmacy Quality Measure Review  This patient is appearing on a report for being at risk of failing the adherence measure for cholesterol (statin) medications this calendar year.   Medication: atorvastatin 40 mg daily Last fill date: 11/19 for 90 day supply  Insurance report was not up to date. No action needed at this time.   Catie Eppie Gibson, PharmD, BCACP, CPP Clinical Pharmacist [facility] [phone]

## 2023-07-03 ENCOUNTER — Ambulatory Visit (INDEPENDENT_AMBULATORY_CARE_PROVIDER_SITE_OTHER): Payer: Medicare Other

## 2023-07-03 DIAGNOSIS — Z0000 Encounter for general adult medical examination without abnormal findings: Secondary | ICD-10-CM

## 2023-07-03 NOTE — Progress Notes (Signed)
Subjective:   Martha Pham is a 72 y.o. female who presents for Medicare Annual (Subsequent) preventive examination.  Visit Complete: Virtual I connected with  Maximiano Coss Noyes on 07/03/23 by a audio enabled telemedicine application and verified that I am speaking with the correct person using two identifiers.  Patient Location: Home  Provider Location: Office/Clinic  I discussed the limitations of evaluation and management by telemedicine. The patient expressed understanding and agreed to proceed.  Vital Signs: Because this visit was a virtual/telehealth visit, some criteria may be missing or patient reported. Any vitals not documented were not able to be obtained and vitals that have been documented are patient reported.  Patient Medicare AWV questionnaire was completed by the patient on 07/03/23; I have confirmed that all information answered by patient is correct and no changes since this date.        Objective:    Today's Vitals   07/03/23 0944  PainSc: 0-No pain   There is no height or weight on file to calculate BMI.     12/01/2022    1:34 PM 09/02/2022    3:21 PM 06/25/2022    3:58 PM 06/19/2021    4:05 PM 01/16/2021    8:46 AM 05/30/2020    9:11 AM 05/12/2019    9:35 AM  Advanced Directives  Does Patient Have a Medical Advance Directive? No No No No No No No  Would patient like information on creating a medical advance directive? No - Patient declined Yes (MAU/Ambulatory/Procedural Areas - Information given) No - Patient declined No - Patient declined  Yes (MAU/Ambulatory/Procedural Areas - Information given) Yes (Inpatient - patient requests chaplain consult to create a medical advance directive)    Current Medications (verified) Outpatient Encounter Medications as of 07/03/2023  Medication Sig   acetaminophen (TYLENOL) 500 MG tablet Take 1,000 mg by mouth every 6 (six) hours as needed for moderate pain.    Ascorbic Acid (VITAMIN C) 1000 MG tablet Take 1,000 mg  by mouth daily.   atorvastatin (LIPITOR) 40 MG tablet Take 1 tablet (40 mg total) by mouth daily.   Blood Glucose Monitoring Suppl (ONETOUCH VERIO) w/Device KIT USE AS DIRECTED TO CHECK BLOOD SUGARS   calcium carbonate (OS-CAL) 600 MG TABS Take 600 mg by mouth 2 (two) times daily with a meal.     Carboxymethylcellulose Sodium (REFRESH OP) Apply 1 drop to eye 2 (two) times daily as needed. For dry eyes   Cyanocobalamin (VITAMIN B-12) 2500 MCG SUBL Place 1 tablet under the tongue daily.   gabapentin (NEURONTIN) 100 MG capsule Take 1 pill at bedtime as needed for leg pain as needed   glucose blood (CONTOUR NEXT TEST) test strip USE AS DIRECTED TO  CHECK  BLOOD  SUGARS  ONCE  A  DAY   glucose blood (ONETOUCH VERIO) test strip Use as instructed   hydrochlorothiazide (HYDRODIURIL) 25 MG tablet Take 1 tablet (25 mg total) by mouth daily.   loratadine (CLARITIN) 10 MG tablet Take 1 tablet (10 mg total) by mouth daily. prn   losartan (COZAAR) 100 MG tablet Take 1 tablet (100 mg total) by mouth daily.   Magnesium 400 MG CAPS Take by mouth daily.    metoprolol succinate (TOPROL-XL) 25 MG 24 hr tablet Take 1 tablet (25 mg total) by mouth daily.   Microlet Lancets MISC USE AS DIRECTED TO  CHECK  BLOOD  SUGARS  ONE  TIME  A  DAY   OneTouch Delica Lancets 30G MISC USE  ONCE DAILY TO CHECK BLOOD SUGAR.   Semaglutide,0.25 or 0.5MG /DOS, (OZEMPIC, 0.25 OR 0.5 MG/DOSE,) 2 MG/3ML SOPN Inject 0.5 mg into the skin once a week.   Vitamin D, Cholecalciferol, 50 MCG (2000 UT) CAPS Take by mouth.    No facility-administered encounter medications on file as of 07/03/2023.    Allergies (verified) Dust mite extract and Other   History: Past Medical History:  Diagnosis Date   Apnea, sleep    does wear cpap   Arthritis    Cancer (HCC)    lt. breast ca-snbx   Chronic kidney disease    Contact lens/glasses fitting    wears contacts or glasses   COPD (chronic obstructive pulmonary disease) (HCC)    brother   Heart  murmur    Hypercholesteremia    Hyperlipemia    Hypertension    Lower back pain    Lung cancer (HCC)    Malaise and fatigue    Personal history of chemotherapy 2013   Sleep apnea    SLEEP STUDY DX 10,CPAP BUT DON'TUSE   Vitamin D deficiency    Wears dentures    upper   Past Surgical History:  Procedure Laterality Date   BREAST BIOPSY Left 05/26/2011   malignant   BREAST BIOPSY Right 10/29/2016   BREAST IMPLANT EXCHANGE Left 09/23/2012   Procedure: REMOVAL OF LEFT EXPANDER/PLACEMENT OF IMPLANT WITH REDUCTION OF RIGHT BREAST;  Surgeon: Wayland Denis, DO;  Location: [facility];  Service: Plastics;  Laterality: Left;   BREAST LUMPECTOMY W/ NEEDLE LOCALIZATION  06/24/2011   left   BREAST REDUCTION SURGERY Right 09/23/2012   Procedure: MAMMARY REDUCTION  (BREAST);  Surgeon: Wayland Denis, DO;  Location: [facility];  Service: Plastics;  Laterality: Right;   BREAST SURGERY  08/07/2011   re-exc. left breast margins   CATARACT EXTRACTION Bilateral    january and february 2022   CYST REMOVAL NECK  08/2013   Dr. Kelly Splinter   LIPOSUCTION Bilateral 09/23/2012   Procedure: LIPOSUCTION;  Surgeon: Wayland Denis, DO;  Location: [facility];  Service: Plastics;  Laterality: Bilateral;   MASTECTOMY Left 2012   MOUTH SURGERY     TOOTH IMPLANT BOTTOM X2 LFT   PORT-A-CATH REMOVAL Right 09/23/2012   Procedure: REMOVAL PORT-A-CATH;  Surgeon: Wayland Denis, DO;  Location: [facility];  Service: Plastics;  Laterality: Right;   PORTACATH PLACEMENT  06/24/2011   Procedure: INSERTION PORT-A-CATH;  Surgeon: Rulon Abide, DO;  Location: [facility];  Service: General;  Laterality: Right;  Right mediport placement   RECONSTRUCTION BREAST IMMEDIATE / DELAYED W/ TISSUE EXPANDER  04/2012   left   REDUCTION MAMMAPLASTY Right 2013   SIMPLE MASTECTOMY  09/01/2011   left   TONSILLECTOMY     tooth implant removed     10/2021   Family  History  Problem Relation Age of Onset   Heart attack Mother    Brain cancer Father    Breast cancer Sister 30   Breast cancer Sister 60   Sleep apnea Neg Hx    Social History   Socioeconomic History   Marital status: Widowed    Spouse name: Not on file   Number of children: 1   Years of education: 14   Highest education level: Not on file  Occupational History   Occupation: Retired  Tobacco Use   Smoking status: Never   Smokeless tobacco: Never  Vaping Use   Vaping status: Never Used  Substance  and Sexual Activity   Alcohol use: Not Currently    Alcohol/week: 2.0 standard drinks of alcohol    Types: 2 Glasses of wine per week    Comment: 8 oz occasionally per week   Drug use: No   Sexual activity: Not Currently    Partners: Male    Birth control/protection: Post-menopausal  Other Topics Concern   Not on file  Social History Narrative   Daughter [place], [place]   Social Drivers of Health   Financial Resource Strain: Low Risk  (07/03/2023)   Overall Financial Resource Strain (CARDIA)    Difficulty of Paying Living Expenses: Not hard at all  Food Insecurity: No Food Insecurity (07/03/2023)   Hunger Vital Sign    Worried About Running Out of Food in the Last Year: Never true    Ran Out of Food in the Last Year: Never true  Transportation Needs: No Transportation Needs (07/03/2023)   PRAPARE - Administrator, Civil Service (Medical): No    Lack of Transportation (Non-Medical): No  Physical Activity: Insufficiently Active (07/03/2023)   Exercise Vital Sign    Days of Exercise per Week: 3 days    Minutes of Exercise per Session: 30 min  Stress: No Stress Concern Present (07/03/2023)   [employer] of Occupational Health - Occupational Stress Questionnaire    Feeling of Stress : Not at all  Social Connections: Moderately Isolated (07/03/2023)   Social Connection and Isolation Panel [NHANES]    Frequency of Communication with Friends and Family: Once  a week    Frequency of Social Gatherings with Friends and Family: Once a week    Attends Religious Services: More than 4 times per year    Active Member of [employer] or Organizations: Yes    Attends Banker Meetings: 1 to 4 times per year    Marital Status: Widowed    Tobacco Counseling Counseling given: Not Answered   Clinical Intake:     Pain Score: 0-No pain                  Activities of Daily Living    07/03/2023    9:51 AM  In your present state of health, do you have any difficulty performing the following activities:  Hearing? 0  Vision? 0  Difficulty concentrating or making decisions? 0  Walking or climbing stairs? 0  Dressing or bathing? 0  Doing errands, shopping? 0    Patient Care Team: Dorothyann Peng, MD as PCP - General (Internal Medicine) Axel Filler, Larna Daughters, NP as Nurse Practitioner (Hematology and Oncology) Huston Foley, MD as Attending Physician (Neurology)  Indicate any recent Medical Services you may have received from other than [facility] providers in the past year (date may be approximate).     Assessment:   This is a routine wellness examination for [place].  Hearing/Vision screen No results found.   Goals Addressed   None   Depression Screen    07/03/2023    9:48 AM 03/31/2023    2:12 PM 09/26/2022    3:27 PM 09/02/2022    3:12 PM 06/25/2022    4:01 PM 04/03/2022   11:37 AM 06/19/2021    4:06 PM  PHQ 2/9 Scores  PHQ - 2 Score 0 0 0 0 0 0 0    Fall Risk    05/28/2023    3:40 PM 09/26/2022    3:44 PM 09/26/2022    3:27 PM 09/02/2022    3:19 PM 06/25/2022  4:00 PM  Fall Risk   Falls in the past year? 0 0 0 0 0  Number falls in past yr: 0 0 0  0  Injury with Fall? 0 0 0  0  Risk for fall due to : No Fall Risks No Fall Risks No Fall Risks  Medication side effect  Follow up Falls evaluation completed Falls evaluation completed Falls evaluation completed  Falls prevention discussed;Education provided;Falls evaluation  completed    MEDICARE RISK AT HOME:    TIMED UP AND GO:  Was the test performed?  No    Cognitive Function:        07/03/2023    9:49 AM 06/25/2022    4:02 PM 06/19/2021    4:07 PM 05/30/2020    9:14 AM 05/12/2019    9:36 AM  6CIT Screen  What Year? 0 points 0 points 0 points 0 points 0 points  What month? 0 points 0 points 0 points 0 points 0 points  What time? 3 points 0 points 0 points 0 points 0 points  Count back from 20 0 points 0 points 0 points 0 points 0 points  Months in reverse 0 points 0 points 0 points 2 points 0 points  Repeat phrase 0 points 8 points 2 points 0 points 8 points  Total Score 3 points 8 points 2 points 2 points 8 points    Immunizations Immunization History  Administered Date(s) Administered   Fluad Quad(high Dose 65+) 05/30/2020, 06/06/2021, 06/25/2022   Fluad Trivalent(High Dose 65+) 05/28/2023   Influenza, High Dose Seasonal PF 05/05/2018, 04/14/2019   Influenza,inj,Quad PF,6+ Mos 04/04/2014   PFIZER(Purple Top)SARS-COV-2 Vaccination 11/03/2019, 11/28/2019, 07/08/2020   PNEUMOCOCCAL CONJUGATE-20 11/28/2021   Pneumococcal Polysaccharide-23 05/12/2019   Tdap 07/03/2022   Zoster Recombinant(Shingrix) 10/22/2021, 04/03/2022    TDAP status: Up to date  Flu Vaccine status: Up to date  Pneumococcal vaccine status: Up to date  Covid-19 vaccine status: Completed vaccines  Qualifies for Shingles Vaccine? Yes   Zostavax completed Yes   Shingrix Completed?: Yes  Screening Tests Health Maintenance  Topic Date Due   COVID-19 Vaccine (4 - 2024-25 season) 03/22/2023   OPHTHALMOLOGY EXAM  07/30/2023   Diabetic kidney evaluation - Urine ACR  09/26/2023   FOOT EXAM  09/26/2023   HEMOGLOBIN A1C  11/25/2023   Diabetic kidney evaluation - eGFR measurement  05/27/2024   Medicare Annual Wellness (AWV)  07/02/2024   MAMMOGRAM  09/29/2024   Colonoscopy  12/25/2024   DTaP/Tdap/Td (2 - Td or Tdap) 07/03/2032   Pneumonia Vaccine 23+ Years old   Completed   INFLUENZA VACCINE  Completed   DEXA SCAN  Completed   Hepatitis C Screening  Completed   Zoster Vaccines- Shingrix  Completed   HPV VACCINES  Aged Out    Health Maintenance  Health Maintenance Due  Topic Date Due   COVID-19 Vaccine (4 - 2024-25 season) 03/22/2023    Colorectal cancer screening: Type of screening: Colonoscopy. Completed 12/21/21. Repeat every 3 years  Mammogram status: Completed 09/30/22. Repeat every year  Bone Density Completed 01/07/2022  Lung Cancer Screening: (Low Dose CT Chest recommended if Age 37-80 years, 20 pack-year currently smoking OR have quit w/in 15years.) does not qualify.   Lung Cancer Screening Referral: n/a  Additional Screening:  Hepatitis C Screening: does qualify; Completed 01/28/2012  Vision Screening: Recommended annual ophthalmology exams for early detection of glaucoma and other disorders of the eye. Is the patient up to date with their annual eye exam?  Yes  Who is the provider or what is the name of the office in which the patient attends annual eye exams? Dr.Woods If pt is not established with a provider, would they like to be referred to a provider to establish care? No .   Dental Screening: Recommended annual dental exams for proper oral hygiene  Diabetic Foot Exam: Diabetic Foot Exam: Completed 09/26/22  Community Resource Referral / Chronic Care Management: CRR required this visit?  No   CCM required this visit?  No     Plan:     I have personally reviewed and noted the following in the patient's chart:   Medical and social history Use of alcohol, tobacco or illicit drugs  Current medications and supplements including opioid prescriptions. Patient is not currently taking opioid prescriptions. Functional ability and status Nutritional status Physical activity Advanced directives List of other physicians Hospitalizations, surgeries, and ER visits in previous 12 months Vitals Screenings to include  cognitive, depression, and falls Referrals and appointments  In addition, I have reviewed and discussed with patient certain preventive protocols, quality metrics, and best practice recommendations. A written personalized care plan for preventive services as well as general preventive health recommendations were provided to patient.     Patrecia Pour [place], CMA   07/03/2023   After Visit Summary: (MyChart) Due to this being a telephonic visit, the after visit summary with patients personalized plan was offered to patient via MyChart   Nurse Notes:

## 2023-07-21 ENCOUNTER — Telehealth: Payer: Self-pay

## 2023-07-21 NOTE — Telephone Encounter (Signed)
 PAP: PAP application for Ozempic, [employer]) has been mailed to pt's home address on file. Will fax provider portion of application to provider's office when pt's portion is received.

## 2023-07-29 ENCOUNTER — Ambulatory Visit: Payer: Medicare Other | Admitting: Internal Medicine

## 2023-07-29 ENCOUNTER — Encounter: Payer: Self-pay | Admitting: Internal Medicine

## 2023-07-29 VITALS — BP 120/70 | Temp 98.1°F | Ht 60.0 in | Wt 156.2 lb

## 2023-07-29 DIAGNOSIS — I129 Hypertensive chronic kidney disease with stage 1 through stage 4 chronic kidney disease, or unspecified chronic kidney disease: Secondary | ICD-10-CM | POA: Diagnosis not present

## 2023-07-29 DIAGNOSIS — E1122 Type 2 diabetes mellitus with diabetic chronic kidney disease: Secondary | ICD-10-CM | POA: Diagnosis not present

## 2023-07-29 DIAGNOSIS — Z7184 Encounter for health counseling related to travel: Secondary | ICD-10-CM

## 2023-07-29 DIAGNOSIS — E66811 Obesity, class 1: Secondary | ICD-10-CM

## 2023-07-29 DIAGNOSIS — E6609 Other obesity due to excess calories: Secondary | ICD-10-CM

## 2023-07-29 DIAGNOSIS — M778 Other enthesopathies, not elsewhere classified: Secondary | ICD-10-CM | POA: Diagnosis not present

## 2023-07-29 DIAGNOSIS — Z6830 Body mass index (BMI) 30.0-30.9, adult: Secondary | ICD-10-CM

## 2023-07-29 DIAGNOSIS — N1831 Chronic kidney disease, stage 3a: Secondary | ICD-10-CM

## 2023-07-29 DIAGNOSIS — M160 Bilateral primary osteoarthritis of hip: Secondary | ICD-10-CM

## 2023-07-29 DIAGNOSIS — E2839 Other primary ovarian failure: Secondary | ICD-10-CM

## 2023-07-29 DIAGNOSIS — E661 Drug-induced obesity: Secondary | ICD-10-CM

## 2023-07-29 DIAGNOSIS — I10 Essential (primary) hypertension: Secondary | ICD-10-CM

## 2023-07-29 MED ORDER — SCOPOLAMINE 1 MG/3DAYS TD PT72: 1.0000 | MEDICATED_PATCH | TRANSDERMAL | 1 refills | Status: DC

## 2023-07-29 MED ORDER — HYDROCHLOROTHIAZIDE 25 MG PO TABS: 25.0000 mg | ORAL_TABLET | Freq: Every day | ORAL | 2 refills | Status: AC

## 2023-07-29 NOTE — Patient Instructions (Signed)

## 2023-07-29 NOTE — Progress Notes (Addendum)
 I,Martha Pham, CMA,acting as a neurosurgeon for Martha LOISE Slocumb, MD.,have documented all relevant documentation on the behalf of Martha LOISE Slocumb, MD,as directed by  Martha LOISE Slocumb, MD while in the presence of Martha LOISE Slocumb, MD.  Subjective:  Patient ID: Martha Pham , female    DOB: 1950/09/27 , 73 y.o.   MRN: [id number]  Chief Complaint  Patient presents with   Diabetes   Hypertension    HPI  Patient present for a bp & diabetes check. She reports compliance with meds. She denies having any headaches, chest pain and shortness of breath.  She denies having any specific concerns or complaints at this time.    Hypertension This is a chronic problem. The current episode started more than 1 year ago. The problem has been gradually improving since onset. The problem is controlled. Risk factors for coronary artery disease include diabetes mellitus, dyslipidemia, post-menopausal state, obesity and sedentary lifestyle. The current treatment provides moderate improvement. Compliance problems include exercise.   Diabetes She presents for her follow-up diabetic visit. She has type 2 diabetes mellitus. Her disease course has been improving. There are no hypoglycemic associated symptoms. Pertinent negatives for diabetes include no polydipsia, no polyphagia and no polyuria. There are no hypoglycemic complications.     Past Medical History:  Diagnosis Date   Apnea, sleep    does wear cpap   Arthritis    Cancer (HCC)    lt. breast ca-snbx   Chronic kidney disease    Contact lens/glasses fitting    wears contacts or glasses   COPD (chronic obstructive pulmonary disease) (HCC)    brother   Heart murmur    Hypercholesteremia    Hyperlipemia    Hypertension    Lower back pain    Lung cancer (HCC)    Malaise and fatigue    Personal history of chemotherapy 2013   Sleep apnea    SLEEP STUDY DX 10,CPAP BUT DON'TUSE   Vitamin D  deficiency    Wears dentures    upper     Family History   Problem Relation Age of Onset   Heart attack Mother    Brain cancer Father    Breast cancer Sister 59   Breast cancer Sister 7   Sleep apnea Neg Hx      Current Outpatient Medications:    acetaminophen  (TYLENOL ) 500 MG tablet, Take 1,000 mg by mouth every 6 (six) hours as needed for moderate pain. , Disp: , Rfl:    Ascorbic Acid (VITAMIN C) 1000 MG tablet, Take 1,000 mg by mouth daily., Disp: , Rfl:    atorvastatin  (LIPITOR) 40 MG tablet, Take 1 tablet (40 mg total) by mouth daily., Disp: 90 tablet, Rfl: 2   Blood Glucose Monitoring Suppl (ONETOUCH VERIO) w/Device KIT, USE AS DIRECTED TO CHECK BLOOD SUGARS, Disp: 1 kit, Rfl: 1   calcium  carbonate (OS-CAL) 600 MG TABS, Take 600 mg by mouth 2 (two) times daily with a meal.  , Disp: , Rfl:    Carboxymethylcellulose Sodium (REFRESH OP), Apply 1 drop to eye 2 (two) times daily as needed. For dry eyes, Disp: , Rfl:    Cyanocobalamin (VITAMIN B-12) 2500 MCG SUBL, Place 1 tablet under the tongue daily., Disp: , Rfl:    gabapentin  (NEURONTIN ) 100 MG capsule, Take 1 pill at bedtime as needed for leg pain as needed, Disp: 90 capsule, Rfl: 1   glucose blood (CONTOUR NEXT TEST) test strip, USE AS DIRECTED TO  CHECK  BLOOD  SUGARS  ONCE  A  DAY, Disp: 100 each, Rfl: 0   glucose blood (ONETOUCH VERIO) test strip, Use as instructed, Disp: 100 each, Rfl: 12   loratadine  (CLARITIN ) 10 MG tablet, Take 1 tablet (10 mg total) by mouth daily. prn, Disp: 30 tablet, Rfl: 2   losartan  (COZAAR ) 100 MG tablet, Take 1 tablet (100 mg total) by mouth daily., Disp: 90 tablet, Rfl: 2   Magnesium 400 MG CAPS, Take by mouth daily. , Disp: , Rfl:    metoprolol  succinate (TOPROL -XL) 25 MG 24 hr tablet, Take 1 tablet (25 mg total) by mouth daily., Disp: 90 tablet, Rfl: 2   Microlet Lancets MISC, USE AS DIRECTED TO  CHECK  BLOOD  SUGARS  ONE  TIME  A  DAY, Disp: 100 each, Rfl: 0   OneTouch Delica Lancets 30G MISC, USE ONCE DAILY TO CHECK BLOOD SUGAR., Disp: 100 each, Rfl: 1    scopolamine  (TRANSDERM SCOP , 1.5 MG,) 1 MG/3DAYS, Place 1 patch (1.5 mg total) onto the skin every 3 (three) days. As needed, Disp: 4 patch, Rfl: 1   Semaglutide ,0.25 or 0.5MG /DOS, (OZEMPIC , 0.25 OR 0.5 MG/DOSE,) 2 MG/3ML SOPN, Inject 0.5 mg into the skin once a week., Disp: 9 mL, Rfl: 1   Vitamin D , Cholecalciferol, 50 MCG (2000 UT) CAPS, Take by mouth. , Disp: , Rfl:    hydrochlorothiazide  (HYDRODIURIL ) 25 MG tablet, Take 1 tablet (25 mg total) by mouth daily., Disp: 90 tablet, Rfl: 2   Allergies  Allergen Reactions   Dust Mite Extract Itching   Other     grass     Review of Systems  Constitutional: Negative.   Respiratory: Negative.    Cardiovascular: Negative.   Gastrointestinal: Negative.   Endocrine: Negative for polydipsia, polyphagia and polyuria.  Musculoskeletal:  Positive for arthralgias.       She c/o left elbow pain. Denies fall/trauma.   Neurological: Negative.   Psychiatric/Behavioral: Negative.       Today's Vitals   07/29/23 1527  BP: 120/70  Temp: 98.1 F (36.7 C)  SpO2: 98%  Weight: 156 lb 3.2 oz (70.9 kg)  Height: 5' (1.524 m)   Body mass index is 30.51 kg/m.  Wt Readings from Last 3 Encounters:  07/29/23 156 lb 3.2 oz (70.9 kg)  05/28/23 150 lb 12.8 oz (68.4 kg)  01/26/23 149 lb 6.4 oz (67.8 kg)     Objective:  Physical Exam Vitals and nursing note reviewed.  Constitutional:      Appearance: Normal appearance.  HENT:     Head: Normocephalic and atraumatic.  Eyes:     Extraocular Movements: Extraocular movements intact.  Cardiovascular:     Rate and Rhythm: Normal rate and regular rhythm.     Heart sounds: Normal heart sounds.  Pulmonary:     Effort: Pulmonary effort is normal.     Breath sounds: Normal breath sounds.  Musculoskeletal:     Cervical back: Normal range of motion.  Skin:    General: Skin is warm.  Neurological:     General: No focal deficit present.     Mental Status: She is alert.  Psychiatric:        Mood and Affect:  Mood normal.        Behavior: Behavior normal.         Assessment And Plan:  Type 2 diabetes mellitus with stage 3a chronic kidney disease, without long-term current use of insulin (HCC) Assessment & Plan: Chronic, her last A1c was 5.7 in  November 2024.  She will c/w semaglutide  0.5mg  weekly. Importance of dietary/medication compliance was discussed with the patient. She was congratulated on the lifestyle changes she has made thus far. She will f/u in 3-4 months.    Hypertensive nephropathy Assessment & Plan: Chronic, well controlled.  She will continue with losartan  100mg  daily and metoprolol  XL 25mg  daily.  She is encouraged to follow low sodium diet.    Left elbow tendinitis Assessment & Plan: She will apply Voltaren gel to affected area two to three times daily. She will let me know if her sx persist/worsen.    Estrogen deficiency Assessment & Plan: She is scheduled for bone density on June 2025.    Class 1 obesity due to excess calories with serious comorbidity and body mass index (BMI) of 30.0 to 30.9 in adult Assessment & Plan: She is encouraged to aim for at least 150 minutes of exercise per day to achieve BMI<30 to decrease cardiac risk.    Counseling about travel Assessment & Plan: We discussed use of patch for use cruise if she has sx of motion/seasickness. I will send rx scopolamine  patch to use every 72 hours as needed.    Other orders -     hydroCHLOROthiazide ; Take 1 tablet (25 mg total) by mouth daily.  Dispense: 90 tablet; Refill: 2 -     Scopolamine ; Place 1 patch (1.5 mg total) onto the skin every 3 (three) days. As needed  Dispense: 4 patch; Refill: 1  She is encouraged to strive for BMI less than 30 to decrease cardiac risk. Advised to aim for at least 150 minutes of exercise per week.    Return if symptoms worsen or fail to improve, for  .  Patient was given opportunity to ask questions. Patient verbalized understanding of the plan and was able to  repeat key elements of the plan. All questions were answered to their satisfaction.    I, Martha LOISE Slocumb, MD, have reviewed all documentation for this visit. The documentation on 07/29/23 for the exam, diagnosis, procedures, and orders are all accurate and complete.   IF YOU HAVE BEEN REFERRED TO A SPECIALIST, IT MAY TAKE 1-2 WEEKS TO SCHEDULE/PROCESS THE REFERRAL. IF YOU HAVE NOT HEARD FROM US /SPECIALIST IN TWO WEEKS, PLEASE GIVE US  A CALL AT [phone] X [phone].   THE PATIENT IS ENCOURAGED TO PRACTICE SOCIAL DISTANCING DUE TO THE COVID-19 PANDEMIC.

## 2023-08-01 DIAGNOSIS — M778 Other enthesopathies, not elsewhere classified: Secondary | ICD-10-CM | POA: Insufficient documentation

## 2023-08-01 DIAGNOSIS — Z7184 Encounter for health counseling related to travel: Secondary | ICD-10-CM | POA: Insufficient documentation

## 2023-08-01 NOTE — Assessment & Plan Note (Signed)
 Chronic, well controlled.  She will continue with losartan 100mg  daily and metoprolol XL 25mg  daily.  She is encouraged to follow low sodium diet.

## 2023-08-01 NOTE — Assessment & Plan Note (Signed)
 She will apply Voltaren gel to affected area two to three times daily. She will let me know if her sx persist/worsen.

## 2023-08-01 NOTE — Assessment & Plan Note (Signed)
 She is encouraged to aim for at least 150 minutes of exercise per day to achieve BMI<30 to decrease cardiac risk.

## 2023-08-01 NOTE — Assessment & Plan Note (Signed)
 We discussed use of patch for use cruise if she has sx of motion/seasickness. I will send rx scopolamine patch to use every 72 hours as needed.

## 2023-08-01 NOTE — Assessment & Plan Note (Signed)
 Chronic, her last A1c was 5.7 in November 2024.  She will c/w semaglutide  0.5mg  weekly. Importance of dietary/medication compliance was discussed with the patient. She was congratulated on the lifestyle changes she has made thus far. She will f/u in 3-4 months.

## 2023-08-01 NOTE — Assessment & Plan Note (Signed)
 She is scheduled for bone density on June 2025.

## 2023-09-01 NOTE — Telephone Encounter (Signed)
PAP: Application for Ozempic has been submitted to [employer], via fax

## 2023-09-03 NOTE — Telephone Encounter (Signed)
PAP: Patient assistance application for Ozempic has been approved by PAP Companies: [employer] from 09/02/2023 to 07/20/2024. Medication should be delivered to PAP Delivery: Provider's office. For further shipping updates, please [employer] at [phone]. Patient ID is: [id number]

## 2023-09-03 NOTE — Progress Notes (Signed)
Pharmacy Medication Assistance Program Note    09/03/2023  Patient ID: Martha Pham, female   DOB: Nov 05, 1950, 73 y.o.   MRN: [id number]     07/21/2023  [facility] [facility]  Manufacturer Medication One [employer] [facility] Ozempic  Dose of Ozempic 0.5mg /week  Type of Radiographer, therapeutic Assistance  Date Application Sent to Patient 07/21/2023  Application Items Requested Application;Proof of Income  Date Application Sent to Prescriber 07/21/2023  Name of Prescriber Dorothyann Peng  Date Application Received From Patient 09/01/2023  Application Items Received From Patient Application  Date Application Received From Provider 09/01/2023  Date Application Submitted to Manufacturer 09/01/2023  Patient Assistance Determination Approved  Approval Start Date 09/03/2023  Approval End Date 07/20/2024  Patient Notification Method MyChart     Signature

## 2023-09-11 ENCOUNTER — Other Ambulatory Visit: Payer: Self-pay | Admitting: Internal Medicine

## 2023-09-11 DIAGNOSIS — Z1231 Encounter for screening mammogram for malignant neoplasm of breast: Secondary | ICD-10-CM

## 2023-09-17 DIAGNOSIS — N1831 Chronic kidney disease, stage 3a: Secondary | ICD-10-CM | POA: Diagnosis not present

## 2023-09-18 ENCOUNTER — Telehealth: Payer: Self-pay

## 2023-09-18 NOTE — Telephone Encounter (Signed)
 Called patient to let know patient assistance was here and ready for pick up.

## 2023-09-19 IMAGING — MG MM DIGITAL SCREENING UNILAT*R* W/ TOMO W/ CAD
4 series · 4 of 12 positions shown · non-contrast
Comparison: Previous exam(s).

ACR Breast Density Category a: The breast tissue is almost entirely
fatty.

CLINICAL DATA: Screening. History of LEFT breast cancer, LEFT
mastectomy and benign RIGHT breast biopsy.

EXAM:
DIGITAL SCREENING UNILATERAL RIGHT MAMMOGRAM WITH CAD AND
TOMOSYNTHESIS
TECHNIQUE: Right screening digital craniocaudal and mediolateral oblique
mammograms were obtained. Right screening digital breast
tomosynthesis was performed. The images were evaluated with
computer-aided detection.

[R CC synth-2D]
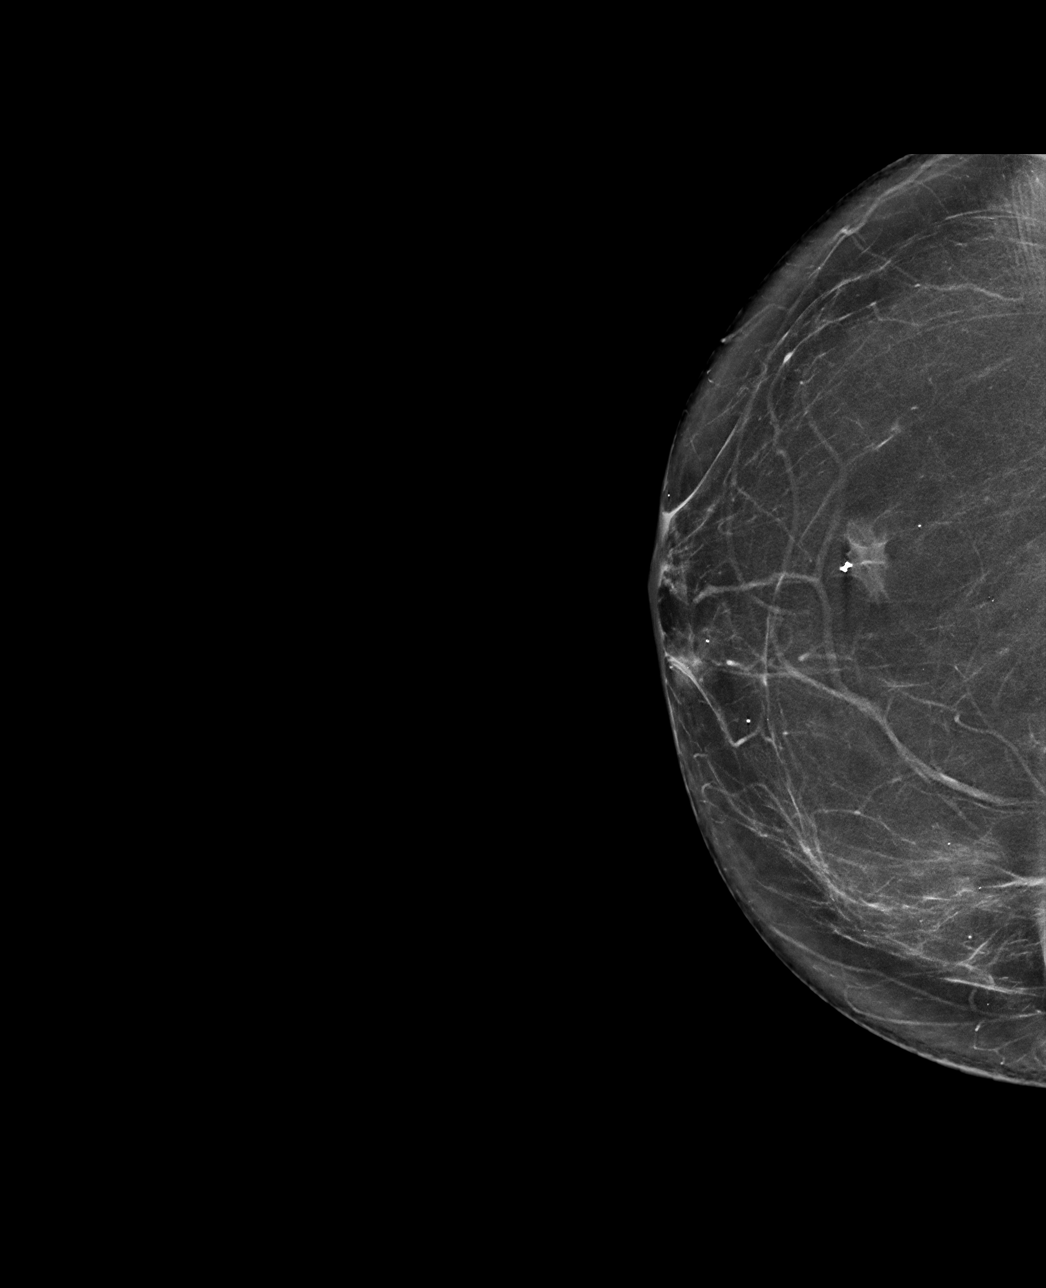

[R MLO synth-2D]
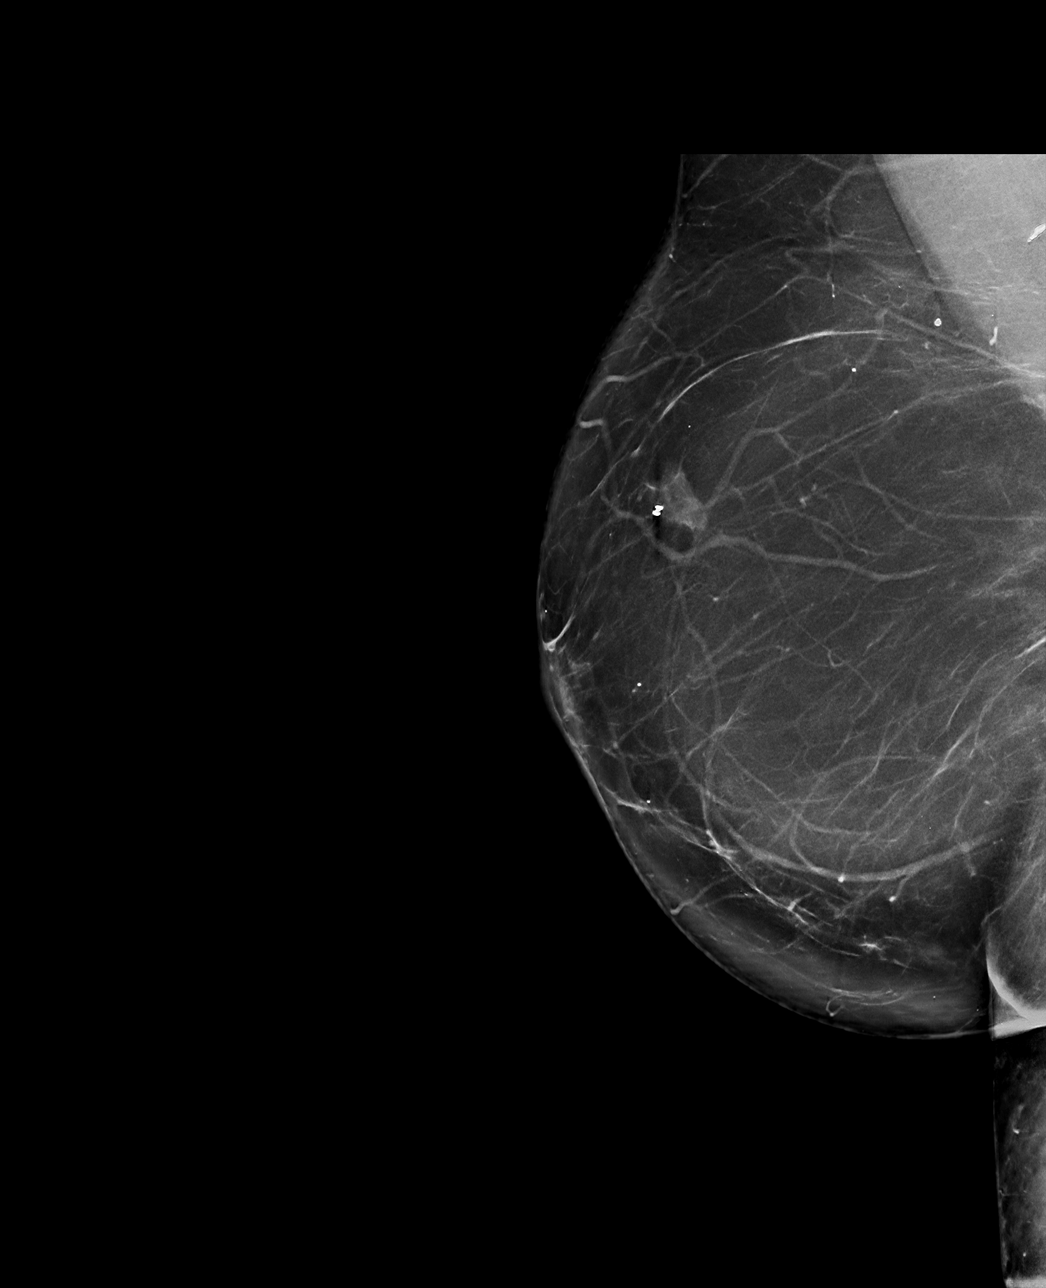

[R CC tomo · tomo slice 39/77.0]
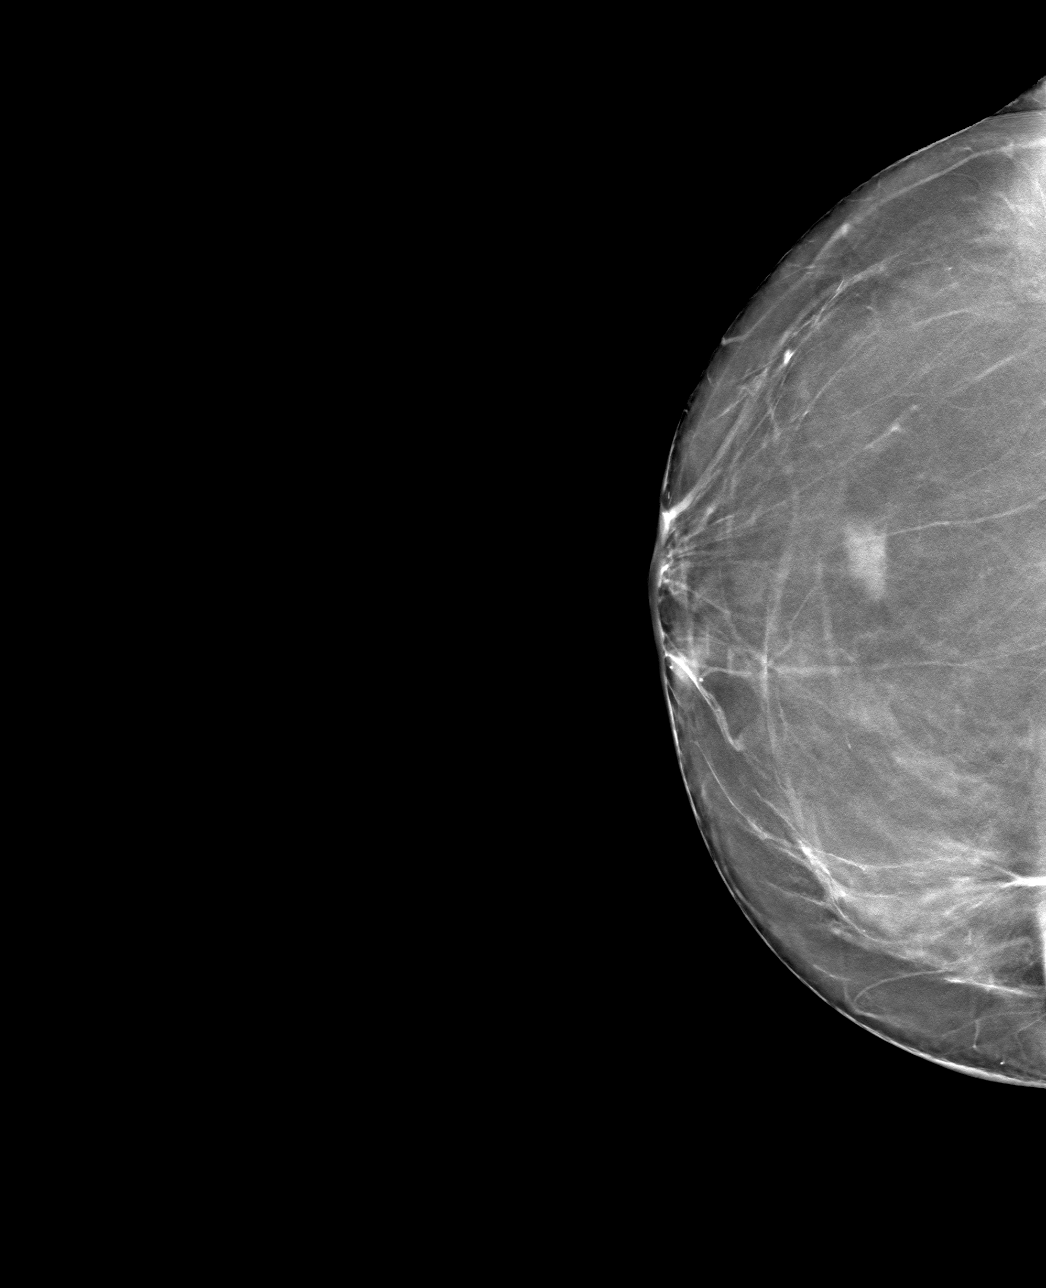

[R MLO tomo · tomo slice 46/91.0]
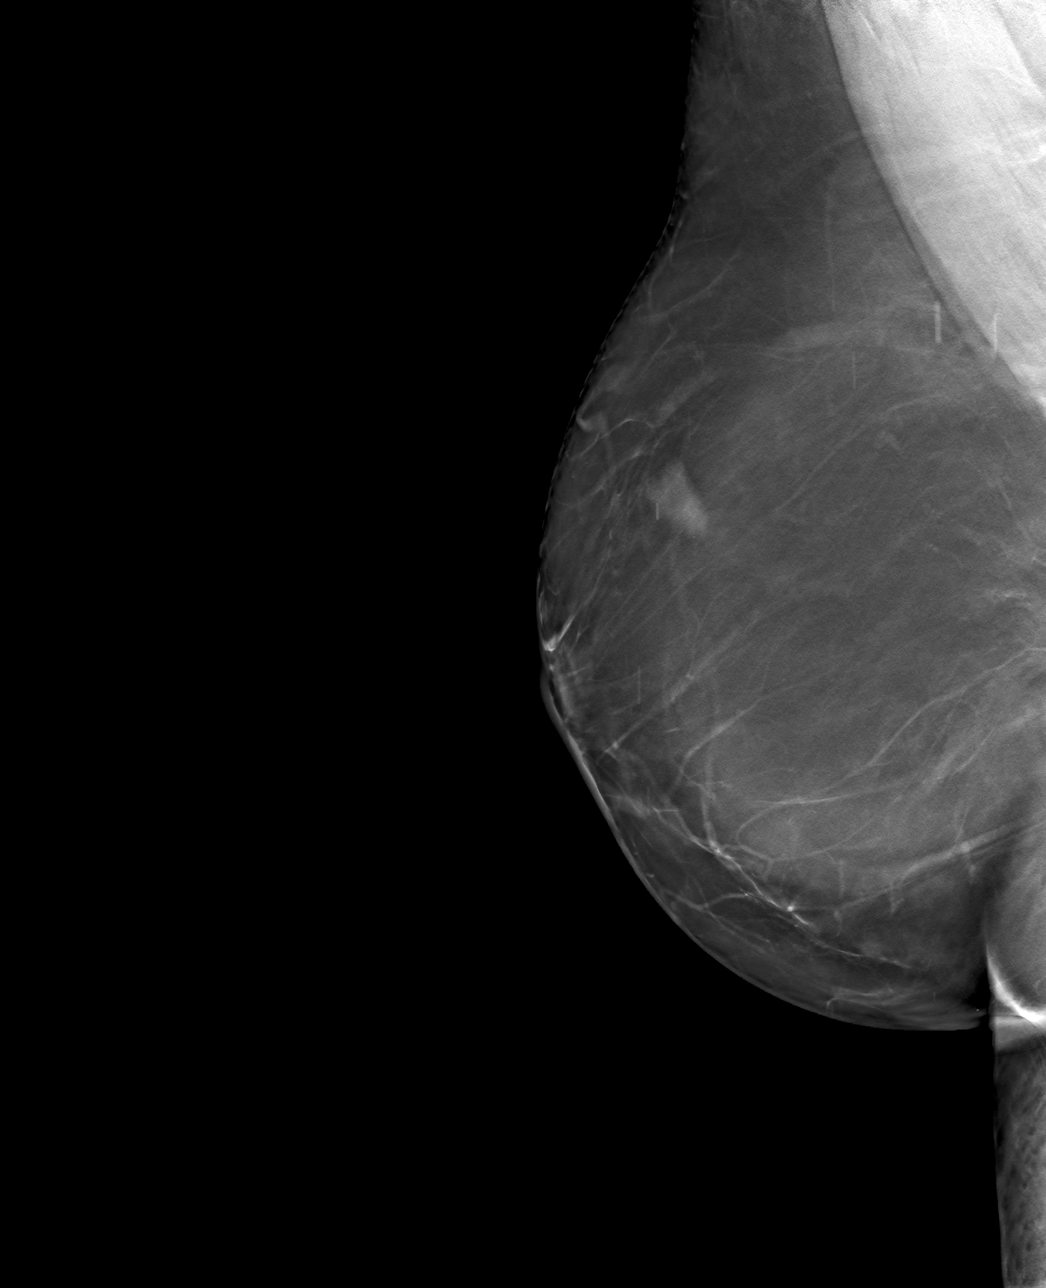

[4 of 12 positions shown; findings below may reference images not displayed]

FINDINGS: The patient has had a left mastectomy. There are no findings
suspicious for malignancy.

A biopsy clip in the UPPER RIGHT breast is again noted.
IMPRESSION: No mammographic evidence of malignancy. A result letter of this
screening mammogram will be mailed directly to the patient.

RECOMMENDATION:
Screening mammogram in one year.  (Code:VL-H-F3L)

BI-RADS CATEGORY  2: Benign.

## 2023-09-21 DIAGNOSIS — I129 Hypertensive chronic kidney disease with stage 1 through stage 4 chronic kidney disease, or unspecified chronic kidney disease: Secondary | ICD-10-CM | POA: Diagnosis not present

## 2023-09-21 DIAGNOSIS — E1122 Type 2 diabetes mellitus with diabetic chronic kidney disease: Secondary | ICD-10-CM | POA: Diagnosis not present

## 2023-09-21 DIAGNOSIS — N1831 Chronic kidney disease, stage 3a: Secondary | ICD-10-CM | POA: Diagnosis not present

## 2023-09-23 ENCOUNTER — Ambulatory Visit: Payer: Medicare Other | Admitting: Sports Medicine

## 2023-10-01 ENCOUNTER — Ambulatory Visit
Admission: RE | Admit: 2023-10-01 | Discharge: 2023-10-01 | Disposition: A | Discharge status: Home / Self Care | Payer: Medicare Other | Source: Ambulatory Visit | Attending: Internal Medicine | Admitting: Internal Medicine

## 2023-10-01 DIAGNOSIS — Z1231 Encounter for screening mammogram for malignant neoplasm of breast: Secondary | ICD-10-CM | POA: Diagnosis not present

## 2023-10-05 DIAGNOSIS — H35033 Hypertensive retinopathy, bilateral: Secondary | ICD-10-CM | POA: Diagnosis not present

## 2023-10-05 DIAGNOSIS — H52223 Regular astigmatism, bilateral: Secondary | ICD-10-CM | POA: Diagnosis not present

## 2023-10-05 DIAGNOSIS — H524 Presbyopia: Secondary | ICD-10-CM | POA: Diagnosis not present

## 2023-10-05 DIAGNOSIS — I10 Essential (primary) hypertension: Secondary | ICD-10-CM | POA: Diagnosis not present

## 2023-10-05 DIAGNOSIS — E119 Type 2 diabetes mellitus without complications: Secondary | ICD-10-CM | POA: Diagnosis not present

## 2023-10-05 LAB — HM DIABETES EYE EXAM

## 2023-10-06 ENCOUNTER — Encounter: Payer: Self-pay | Admitting: Sports Medicine

## 2023-10-06 ENCOUNTER — Other Ambulatory Visit: Payer: Self-pay

## 2023-10-06 ENCOUNTER — Ambulatory Visit: Admitting: Sports Medicine

## 2023-10-06 DIAGNOSIS — N1831 Chronic kidney disease, stage 3a: Secondary | ICD-10-CM

## 2023-10-06 DIAGNOSIS — E1122 Type 2 diabetes mellitus with diabetic chronic kidney disease: Secondary | ICD-10-CM

## 2023-10-06 DIAGNOSIS — M25551 Pain in right hip: Secondary | ICD-10-CM

## 2023-10-06 DIAGNOSIS — M160 Bilateral primary osteoarthritis of hip: Secondary | ICD-10-CM

## 2023-10-06 DIAGNOSIS — G8929 Other chronic pain: Secondary | ICD-10-CM

## 2023-10-06 MED ORDER — LIDOCAINE HCL 1 % IJ SOLN
4.0000 mL | INTRAMUSCULAR | Status: AC | PRN
  Administered 2023-10-06: 4 mL

## 2023-10-06 MED ORDER — METHYLPREDNISOLONE ACETATE 40 MG/ML IJ SUSP
80.0000 mg | INTRAMUSCULAR | Status: AC | PRN
  Administered 2023-10-06: 80 mg via INTRA_ARTICULAR

## 2023-10-06 NOTE — Progress Notes (Signed)
 Martha Pham - 73 y.o. female MRN [id number]  Date of birth: Apr 13, 1951  Office Visit Note: Visit Date: 10/06/2023 PCP: Dorothyann Peng, MD Referred by: Dorothyann Peng, MD  Subjective: Chief Complaint  Patient presents with   Right Hip - Pain   HPI: Martha Pham is a very pleasant 73 y.o. female who presents today for chronic right hip > left hip pain with OA.  She was first evaluated by my partner, Dr. Roda Shutters, in July of last year.  We did discuss treatment options for her and reviewed possible surgical intervention.  She had a cruise in January and wanted to hold off on any treatment until that time.  She presents today to discuss hip injection and other treatment options.  She does have pain in both hips although her right is most symptomatic and would like to focus on that today.  She does use Tylenol and topical medications which do help cut the edge off but never get rid of the pain fully.  She is a type-II diabetic, but is quite well-controlled. Her last A1c was: Lab Results  Component Value Date   HGBA1C 5.7 (H) 05/28/2023   She checks her sugars only as needed.  She is managed on Ozempic 0.5 mg IM injection weekly, she did administer this earlier today.  Pertinent ROS were reviewed with the patient and found to be negative unless otherwise specified above in HPI.   Assessment & Plan: Visit Diagnoses:  1. Primary osteoarthritis of both hips   2. Chronic right hip pain   3. Type 2 diabetes mellitus with stage 3a chronic kidney disease, without long-term current use of insulin (HCC)    Plan: Impression is advanced bilateral hip osteoarthritis with right > left hip pain and limitation of function.  Through shared decision making, we did proceed with ultrasound-guided right intra-articular hip injection, patient tolerated well.  I did evaluate her about 5-10 minutes following injection and she had excellent relief of pain and mobility from the anesthetic portion only.  She may use  ice/heat as well as Tylenol for any postinjection pain, did discuss postinjection protocol today.  Given her well-controlled diabetes, she does not need to check her sugars and less symptomatic, she will continue her Ozempic 0.5 mg IM weekly and did discuss continuing to keep this well-managed if hip replacement were to be desired in the future.  She will see over the next few weeks how the right hip is doing, if she wishes to be further evaluated for left hip including injections or other treatment, she may schedule this at her leisure.  Follow-up: Return if symptoms worsen or fail to improve.   Meds & Orders: No orders of the defined types were placed in this encounter.   Orders Placed This Encounter  Procedures   Large Joint Inj   US Guided Needle Placement - No Linked Charges     Procedures: Large Joint Inj: R hip joint on 10/06/2023 1:11 PM Indications: pain Details: 22 G 3.5 in needle, ultrasound-guided anterior approach Medications: 4 mL lidocaine 1 %; 80 mg methylPREDNISolone acetate 40 MG/ML Outcome: tolerated well, no immediate complications  Procedure: US-guided intra-articular hip injection, Right After discussion on risks/benefits/indications and informed verbal consent was obtained, a timeout was performed. Patient was lying supine on exam table. The hip was cleaned with betadine and alcohol swabs. Then utilizing ultrasound guidance, the patient's femoral head and neck junction was identified and subsequently injected with 4:2 lidocaine:depomedrol via an in-plane approach with  ultrasound visualization of the injectate administered into the hip joint. Patient tolerated procedure well without immediate complications.  Procedure, treatment alternatives, risks and benefits explained, specific risks discussed. Consent was given by the patient. Immediately prior to procedure a time out was called to verify the correct patient, procedure, equipment, support staff and site/side marked as  required. Patient was prepped and draped in the usual sterile fashion.          Clinical History: No specialty comments available.  She reports that she has never smoked. She has never used smokeless tobacco.  Recent Labs    01/26/23 1547 05/28/23 1617  HGBA1C 5.4 5.7*    Objective:    Physical Exam  Gen: Well-appearing, in no acute distress; non-toxic CV: Well-perfused. Warm.  Resp: Breathing unlabored on room air; no wheezing. Psych: Fluid speech in conversation; appropriate affect; normal thought process  Ortho Exam - Right hip: There is no bony tenderness, no redness swelling or overlying effusion.  There is limitation with bony blocking with internal and external logroll as well as associated pain with internal motion.  Positive FADIR and Stinchfield testing.  Imaging:  *Independent review and interpretation of bilateral hip x-rays from 12/01/2022 was performed by myself today.  X-rays demonstrate moderate to severe bilateral osteoarthritis of each hip.  There is osteophytosis present bilaterally, slightly worse on the left.  With attention to the right hip there is areas of high-grade joint space narrowing with subchondral sclerosis of the femoral head, from degenerative arthritic change.  Narrative & Impression  CLINICAL DATA:  Groin pain, hip pain, range of motion difficulty.   EXAM: DG HIP (WITH OR WITHOUT PELVIS) 3-4V BILAT   COMPARISON:  None Available.   FINDINGS: Superomedial joint space narrowing bilaterally with subchondral sclerosis and osteophytosis, minimally worse on the left. Sacroiliac joints are patent.   IMPRESSION: Moderate bilateral hip osteoarthritis, left slightly greater than right.     Electronically Signed   By: Leanna Battles M.D.   On: 12/05/2022 17:10    Past Medical/Family/Surgical/Social History: Medications & Allergies reviewed per EMR, new medications updated. Patient Active Problem List   Diagnosis Date Noted   Left elbow  tendinitis 08/01/2023   Counseling about travel 08/01/2023   Estrogen deficiency 05/28/2023   Essential hypertension 01/26/2023   Primary osteoarthritis of both hips 01/26/2023   RLS (restless legs syndrome) 01/26/2023   Pure hypercholesterolemia 04/06/2022   Palpitations 04/06/2022   Personal history of COVID-19 04/06/2022   Mixed hyperlipidemia 06/06/2021   Class 1 obesity due to excess calories with serious comorbidity and body mass index (BMI) of 30.0 to 30.9 in adult 06/06/2021   Keloid 06/06/2021   History of breast cancer 11/23/2020   Type 2 diabetes mellitus with stage 3a chronic kidney disease, without long-term current use of insulin (HCC) 06/22/2018   Chronic renal disease, stage II 06/22/2018   Parenchymal renal hypertension 06/22/2018   Overweight with body mass index (BMI) of 29 to 29.9 in adult 06/22/2018   Pain in joint, pelvic region and thigh 03/28/2014   Acquired absence of breast and absent nipple 05/05/2012   Past Medical History:  Diagnosis Date   Apnea, sleep    does wear cpap   Arthritis    Cancer (HCC)    lt. breast ca-snbx   Chronic kidney disease    Contact lens/glasses fitting    wears contacts or glasses   COPD (chronic obstructive pulmonary disease) (HCC)    brother   Heart murmur  Hypercholesteremia    Hyperlipemia    Hypertension    Lower back pain    Lung cancer (HCC)    Malaise and fatigue    Personal history of chemotherapy 2013   Sleep apnea    SLEEP STUDY DX 10,CPAP BUT DON'TUSE   Vitamin D deficiency    Wears dentures    upper   Family History  Problem Relation Age of Onset   Heart attack Mother    Brain cancer Father    Breast cancer Sister 69   Breast cancer Sister 64   Sleep apnea Neg Hx    Past Surgical History:  Procedure Laterality Date   BREAST BIOPSY Left 05/26/2011   malignant   BREAST BIOPSY Right 10/29/2016   BREAST IMPLANT EXCHANGE Left 09/23/2012   Procedure: REMOVAL OF LEFT EXPANDER/PLACEMENT OF IMPLANT  WITH REDUCTION OF RIGHT BREAST;  Surgeon: Wayland Denis, DO;  Location: [facility];  Service: Plastics;  Laterality: Left;   BREAST LUMPECTOMY W/ NEEDLE LOCALIZATION  06/24/2011   left   BREAST REDUCTION SURGERY Right 09/23/2012   Procedure: MAMMARY REDUCTION  (BREAST);  Surgeon: Wayland Denis, DO;  Location: [facility];  Service: Plastics;  Laterality: Right;   BREAST SURGERY  08/07/2011   re-exc. left breast margins   CATARACT EXTRACTION Bilateral    january and february 2022   CYST REMOVAL NECK  08/2013   Dr. Kelly Splinter   LIPOSUCTION Bilateral 09/23/2012   Procedure: LIPOSUCTION;  Surgeon: Wayland Denis, DO;  Location: [facility];  Service: Plastics;  Laterality: Bilateral;   MASTECTOMY Left 2012   MOUTH SURGERY     TOOTH IMPLANT BOTTOM X2 LFT   PORT-A-CATH REMOVAL Right 09/23/2012   Procedure: REMOVAL PORT-A-CATH;  Surgeon: Wayland Denis, DO;  Location: [facility];  Service: Plastics;  Laterality: Right;   PORTACATH PLACEMENT  06/24/2011   Procedure: INSERTION PORT-A-CATH;  Surgeon: Rulon Abide, DO;  Location: [facility];  Service: General;  Laterality: Right;  Right mediport placement   RECONSTRUCTION BREAST IMMEDIATE / DELAYED W/ TISSUE EXPANDER  04/2012   left   REDUCTION MAMMAPLASTY Right 2013   SIMPLE MASTECTOMY  09/01/2011   left   TONSILLECTOMY     tooth implant removed     10/2021   Social History   Occupational History   Occupation: Retired  Tobacco Use   Smoking status: Never   Smokeless tobacco: Never  Vaping Use   Vaping status: Never Used  Substance and Sexual Activity   Alcohol use: Not Currently    Alcohol/week: 2.0 standard drinks of alcohol    Types: 2 Glasses of wine per week    Comment: 8 oz occasionally per week   Drug use: No   Sexual activity: Not Currently    Partners: Male    Birth control/protection: Post-menopausal

## 2023-10-08 ENCOUNTER — Encounter: Payer: Self-pay | Admitting: Internal Medicine

## 2023-10-08 ENCOUNTER — Ambulatory Visit: Payer: Self-pay | Admitting: Internal Medicine

## 2023-10-08 VITALS — BP 132/84 | HR 72 | Temp 97.9°F | Ht 60.0 in | Wt 160.6 lb

## 2023-10-08 DIAGNOSIS — I129 Hypertensive chronic kidney disease with stage 1 through stage 4 chronic kidney disease, or unspecified chronic kidney disease: Secondary | ICD-10-CM

## 2023-10-08 DIAGNOSIS — N1831 Chronic kidney disease, stage 3a: Secondary | ICD-10-CM

## 2023-10-08 DIAGNOSIS — E7800 Pure hypercholesterolemia, unspecified: Secondary | ICD-10-CM

## 2023-10-08 DIAGNOSIS — L853 Xerosis cutis: Secondary | ICD-10-CM | POA: Diagnosis not present

## 2023-10-08 DIAGNOSIS — E6609 Other obesity due to excess calories: Secondary | ICD-10-CM

## 2023-10-08 DIAGNOSIS — E1122 Type 2 diabetes mellitus with diabetic chronic kidney disease: Secondary | ICD-10-CM | POA: Diagnosis not present

## 2023-10-08 DIAGNOSIS — Z0000 Encounter for general adult medical examination without abnormal findings: Secondary | ICD-10-CM

## 2023-10-08 DIAGNOSIS — Z6831 Body mass index (BMI) 31.0-31.9, adult: Secondary | ICD-10-CM

## 2023-10-08 DIAGNOSIS — E2839 Other primary ovarian failure: Secondary | ICD-10-CM

## 2023-10-08 DIAGNOSIS — E66811 Obesity, class 1: Secondary | ICD-10-CM | POA: Diagnosis not present

## 2023-10-08 LAB — POCT URINALYSIS DIPSTICK
Bilirubin, UA: NEGATIVE
Blood, UA: NEGATIVE
Glucose, UA: NEGATIVE
Ketones, UA: NEGATIVE
Nitrite, UA: NEGATIVE
Protein, UA: NEGATIVE
Spec Grav, UA: 1.01 (ref 1.010–1.025)
Urobilinogen, UA: 0.2 U/dL
pH, UA: 5.5 (ref 5.0–8.0)

## 2023-10-08 NOTE — Patient Instructions (Signed)
 Health Maintenance for Postmenopausal Women Menopause is a normal process in which your ability to get pregnant comes to an end. This process happens slowly over many months or years, usually between the ages of 46 and 60. Menopause is complete when you have missed your menstrual period for 12 months. It is important to talk with your health care provider about some of the most common conditions that affect women after menopause (postmenopausal women). These include heart disease, cancer, and bone loss (osteoporosis). Adopting a healthy lifestyle and getting preventive care can help to promote your health and wellness. The actions you take can also lower your chances of developing some of these common conditions. What are the signs and symptoms of menopause? During menopause, you may have the following symptoms: Hot flashes. These can be moderate or severe. Night sweats. Decrease in sex drive. Mood swings. Headaches. Tiredness (fatigue). Irritability. Memory problems. Problems falling asleep or staying asleep. Talk with your health care provider about treatment options for your symptoms. Do I need hormone replacement therapy? Hormone replacement therapy is effective in treating symptoms that are caused by menopause, such as hot flashes and night sweats. Hormone replacement carries certain risks, especially as you become older. If you are thinking about using estrogen or estrogen with progestin, discuss the benefits and risks with your health care provider. How can I reduce my risk for heart disease and stroke? The risk of heart disease, heart attack, and stroke increases as you age. One of the causes may be a change in the body's hormones during menopause. This can affect how your body uses dietary fats, triglycerides, and cholesterol. Heart attack and stroke are medical emergencies. There are many things that you can do to help prevent heart disease and stroke. Watch your blood pressure High  blood pressure causes heart disease and increases the risk of stroke. This is more likely to develop in people who have high blood pressure readings or are overweight. Have your blood pressure checked: Every 3-5 years if you are 44-72 years of age. Every year if you are 38 years old or older. Eat a healthy diet  Eat a diet that includes plenty of vegetables, fruits, low-fat dairy products, and lean protein. Do not eat a lot of foods that are high in solid fats, added sugars, or sodium. Get regular exercise Get regular exercise. This is one of the most important things you can do for your health. Most adults should: Try to exercise for at least 150 minutes each week. The exercise should increase your heart rate and make you sweat (moderate-intensity exercise). Try to do strengthening exercises at least twice each week. Do these in addition to the moderate-intensity exercise. Spend less time sitting. Even light physical activity can be beneficial. Other tips Work with your health care provider to achieve or maintain a healthy weight. Do not use any products that contain nicotine or tobacco. These products include cigarettes, chewing tobacco, and vaping devices, such as e-cigarettes. If you need help quitting, ask your health care provider. Know your numbers. Ask your health care provider to check your cholesterol and your blood sugar (glucose). Continue to have your blood tested as directed by your health care provider. Do I need screening for cancer? Depending on your health history and family history, you may need to have cancer screenings at different stages of your life. This may include screening for: Breast cancer. Cervical cancer. Lung cancer. Colorectal cancer. What is my risk for osteoporosis? After menopause, you may be  at increased risk for osteoporosis. Osteoporosis is a condition in which bone destruction happens more quickly than new bone creation. To help prevent osteoporosis or  the bone fractures that can happen because of osteoporosis, you may take the following actions: If you are 81-54 years old, get at least 1,000 mg of calcium and at least 600 international units (IU) of vitamin D per day. If you are older than age 90 but younger than age 54, get at least 1,200 mg of calcium and at least 600 international units (IU) of vitamin D per day. If you are older than age 58, get at least 1,200 mg of calcium and at least 800 international units (IU) of vitamin D per day. Smoking and drinking excessive alcohol increase the risk of osteoporosis. Eat foods that are rich in calcium and vitamin D, and do weight-bearing exercises several times each week as directed by your health care provider. How does menopause affect my mental health? Depression may occur at any age, but it is more common as you become older. Common symptoms of depression include: Feeling depressed. Changes in sleep patterns. Changes in appetite or eating patterns. Feeling an overall lack of motivation or enjoyment of activities that you previously enjoyed. Frequent crying spells. Talk with your health care provider if you think that you are experiencing any of these symptoms. General instructions See your health care provider for regular wellness exams and vaccines. This may include: Scheduling regular health, dental, and eye exams. Getting and maintaining your vaccines. These include: Influenza vaccine. Get this vaccine each year before the flu season begins. Pneumonia vaccine. Shingles vaccine. Tetanus, diphtheria, and pertussis (Tdap) booster vaccine. Your health care provider may also recommend other immunizations. Tell your health care provider if you have ever been abused or do not feel safe at home. Summary Menopause is a normal process in which your ability to get pregnant comes to an end. This condition causes hot flashes, night sweats, decreased interest in sex, mood swings, headaches, or lack  of sleep. Treatment for this condition may include hormone replacement therapy. Take actions to keep yourself healthy, including exercising regularly, eating a healthy diet, watching your weight, and checking your blood pressure and blood sugar levels. Get screened for cancer and depression. Make sure that you are up to date with all your vaccines. This information is not intended to replace advice given to you by your health care provider. Make sure you discuss any questions you have with your health care provider. Document Revised: 11/26/2020 Document Reviewed: 11/26/2020 Elsevier Patient Education  2024 [employer]Health Maintenance After Age 55 After age 25, you are at a higher risk for certain long-term diseases and infections as well as injuries from falls. Falls are a major cause of broken bones and head injuries in people who are older than age 50. Getting regular preventive care can help to keep you healthy and well. Preventive care includes getting regular testing and making lifestyle changes as recommended by your health care provider. Talk with your health care provider about: Which screenings and tests you should have. A screening is a test that checks for a disease when you have no symptoms. A diet and exercise plan that is right for you. What should I know about screenings and tests to prevent falls? Screening and testing are the best ways to find a health problem early. Early diagnosis and treatment give you the best chance of managing medical conditions that are common after age 59. Certain conditions and lifestyle  choices may make you more likely to have a fall. Your health care provider may recommend: Regular vision checks. Poor vision and conditions such as cataracts can make you more likely to have a fall. If you wear glasses, make sure to get your prescription updated if your vision changes. Medicine review. Work with your health care provider to regularly review all of the  medicines you are taking, including over-the-counter medicines. Ask your health care provider about any side effects that may make you more likely to have a fall. Tell your health care provider if any medicines that you take make you feel dizzy or sleepy. Strength and balance checks. Your health care provider may recommend certain tests to check your strength and balance while standing, walking, or changing positions. Foot health exam. Foot pain and numbness, as well as not wearing proper footwear, can make you more likely to have a fall. Screenings, including: Osteoporosis screening. Osteoporosis is a condition that causes the bones to get weaker and break more easily. Blood pressure screening. Blood pressure changes and medicines to control blood pressure can make you feel dizzy. Depression screening. You may be more likely to have a fall if you have a fear of falling, feel depressed, or feel unable to do activities that you used to do. Alcohol use screening. Using too much alcohol can affect your balance and may make you more likely to have a fall. Follow these instructions at home: Lifestyle Do not drink alcohol if: Your health care provider tells you not to drink. If you drink alcohol: Limit how much you have to: 0-1 drink a day for women. 0-2 drinks a day for men. Know how much alcohol is in your drink. In the [place] one drink equals one 12 oz bottle of beer (355 mL), one 5 oz glass of wine (148 mL), or one 1 oz glass of hard liquor (44 mL). Do not use any products that contain nicotine or tobacco. These products include cigarettes, chewing tobacco, and vaping devices, such as e-cigarettes. If you need help quitting, ask your health care provider. Activity  Follow a regular exercise program to stay fit. This will help you maintain your balance. Ask your health care provider what types of exercise are appropriate for you. If you need a cane or walker, use it as recommended by your health  care provider. Wear supportive shoes that have nonskid soles. Safety  Remove any tripping hazards, such as rugs, cords, and clutter. Install safety equipment such as grab bars in bathrooms and safety rails on stairs. Keep rooms and walkways well-lit. General instructions Talk with your health care provider about your risks for falling. Tell your health care provider if: You fall. Be sure to tell your health care provider about all falls, even ones that seem minor. You feel dizzy, tiredness (fatigue), or off-balance. Take over-the-counter and prescription medicines only as told by your health care provider. These include supplements. Eat a healthy diet and maintain a healthy weight. A healthy diet includes low-fat dairy products, low-fat (lean) meats, and fiber from whole grains, beans, and lots of fruits and vegetables. Stay current with your vaccines. Schedule regular health, dental, and eye exams. Summary Having a healthy lifestyle and getting preventive care can help to protect your health and wellness after age 88. Screening and testing are the best way to find a health problem early and help you avoid having a fall. Early diagnosis and treatment give you the best chance for managing medical conditions that are  more common for people who are older than age 84. Falls are a major cause of broken bones and head injuries in people who are older than age 66. Take precautions to prevent a fall at home. Work with your health care provider to learn what changes you can make to improve your health and wellness and to prevent falls. This information is not intended to replace advice given to you by your health care provider. Make sure you discuss any questions you have with your health care provider. Document Revised: 11/26/2020 Document Reviewed: 11/26/2020 Elsevier Patient Education  2024 [employer].

## 2023-10-08 NOTE — Progress Notes (Signed)
 I,Victoria T Deloria Lair, CMA,acting as a Neurosurgeon for Gwynneth Aliment, MD.,have documented all relevant documentation on the behalf of Gwynneth Aliment, MD,as directed by  Gwynneth Aliment, MD while in the presence of Gwynneth Aliment, MD.  Subjective:    Patient ID: Martha Pham , female    DOB: 1950/11/13 , 73 y.o.   MRN: [id number]  Chief Complaint  Patient presents with   Annual Exam   Diabetes   Hypertension   Hyperlipidemia    HPI  Patient presents today for annual exam. She reports compliance with medications. Denies having any headache, chest pain & sob. She gets pelvic exams completed here at the office.   Diabetes She presents for her follow-up diabetic visit. She has type 2 diabetes mellitus. Her disease course has been improving. There are no hypoglycemic associated symptoms. Pertinent negatives for diabetes include no chest pain, no polydipsia, no polyphagia and no polyuria. There are no hypoglycemic complications. Risk factors for coronary artery disease include diabetes mellitus, dyslipidemia, hypertension and obesity.  Hypertension This is a chronic problem. The current episode started more than 1 year ago. The problem has been gradually improving since onset. The problem is controlled. Pertinent negatives include no chest pain or shortness of breath. Risk factors for coronary artery disease include diabetes mellitus, dyslipidemia, post-menopausal state, obesity and sedentary lifestyle. The current treatment provides moderate improvement. Compliance problems include exercise.   Hyperlipidemia This is a chronic problem. The current episode started more than 1 year ago. Exacerbating diseases include diabetes and obesity. Pertinent negatives include no chest pain or shortness of breath. Current antihyperlipidemic treatment includes statins.     Past Medical History:  Diagnosis Date   Apnea, sleep    does wear cpap   Arthritis    Cancer (HCC)    lt. breast ca-snbx   Chronic  kidney disease    Contact lens/glasses fitting    wears contacts or glasses   COPD (chronic obstructive pulmonary disease) (HCC)    brother   Heart murmur    Hypercholesteremia    Hyperlipemia    Hypertension    Lower back pain    Lung cancer (HCC)    Malaise and fatigue    Personal history of chemotherapy 2013   Sleep apnea    SLEEP STUDY DX 10,CPAP BUT DON'TUSE   Vitamin D deficiency    Wears dentures    upper     Family History  Problem Relation Age of Onset   Heart attack Mother    Brain cancer Father    Breast cancer Sister 59   Breast cancer Sister 81   Sleep apnea Neg Hx      Current Outpatient Medications:    acetaminophen (TYLENOL) 500 MG tablet, Take 1,000 mg by mouth every 6 (six) hours as needed for moderate pain. , Disp: , Rfl:    Ascorbic Acid (VITAMIN C) 1000 MG tablet, Take 1,000 mg by mouth daily., Disp: , Rfl:    atorvastatin (LIPITOR) 40 MG tablet, Take 1 tablet (40 mg total) by mouth daily., Disp: 90 tablet, Rfl: 2   Blood Glucose Monitoring Suppl (ONETOUCH VERIO) w/Device KIT, USE AS DIRECTED TO CHECK BLOOD SUGARS, Disp: 1 kit, Rfl: 1   calcium carbonate (OS-CAL) 600 MG TABS, Take 600 mg by mouth 2 (two) times daily with a meal.  , Disp: , Rfl:    Carboxymethylcellulose Sodium (REFRESH OP), Apply 1 drop to eye 2 (two) times daily as needed. For dry eyes, Disp: ,  Rfl:    Cyanocobalamin (VITAMIN B-12) 2500 MCG SUBL, Place 1 tablet under the tongue daily., Disp: , Rfl:    gabapentin (NEURONTIN) 100 MG capsule, Take 1 pill at bedtime as needed for leg pain as needed, Disp: 90 capsule, Rfl: 1   glucose blood (CONTOUR NEXT TEST) test strip, USE AS DIRECTED TO  CHECK  BLOOD  SUGARS  ONCE  A  DAY, Disp: 100 each, Rfl: 0   glucose blood (ONETOUCH VERIO) test strip, Use as instructed, Disp: 100 each, Rfl: 12   hydrochlorothiazide (HYDRODIURIL) 25 MG tablet, Take 1 tablet (25 mg total) by mouth daily., Disp: 90 tablet, Rfl: 2   loratadine (CLARITIN) 10 MG tablet,  Take 1 tablet (10 mg total) by mouth daily. prn, Disp: 30 tablet, Rfl: 2   losartan (COZAAR) 100 MG tablet, Take 1 tablet (100 mg total) by mouth daily., Disp: 90 tablet, Rfl: 2   Magnesium 400 MG CAPS, Take by mouth daily. , Disp: , Rfl:    metoprolol succinate (TOPROL-XL) 25 MG 24 hr tablet, Take 1 tablet (25 mg total) by mouth daily., Disp: 90 tablet, Rfl: 2   Microlet Lancets MISC, USE AS DIRECTED TO  CHECK  BLOOD  SUGARS  ONE  TIME  A  DAY, Disp: 100 each, Rfl: 0   OneTouch Delica Lancets 30G MISC, USE ONCE DAILY TO CHECK BLOOD SUGAR., Disp: 100 each, Rfl: 1   Semaglutide,0.25 or 0.5MG /DOS, (OZEMPIC, 0.25 OR 0.5 MG/DOSE,) 2 MG/3ML SOPN, Inject 0.5 mg into the skin once a week., Disp: 9 mL, Rfl: 1   Vitamin D, Cholecalciferol, 50 MCG (2000 UT) CAPS, Take by mouth. , Disp: , Rfl:    scopolamine (TRANSDERM SCOP, 1.5 MG,) 1 MG/3DAYS, Place 1 patch (1.5 mg total) onto the skin every 3 (three) days. As needed (Patient not taking: Reported on 10/08/2023), Disp: 4 patch, Rfl: 1   Allergies  Allergen Reactions   Dust Mite Extract Itching   Other     grass      The patient states she uses post menopausal status for birth control. No LMP recorded. Patient is postmenopausal.. Negative for Dysmenorrhea. Negative for: breast discharge, breast lump(s), breast pain and breast self exam. Associated symptoms include abnormal vaginal bleeding. Pertinent negatives include abnormal bleeding (hematology), anxiety, decreased libido, depression, difficulty falling sleep, dyspareunia, history of infertility, nocturia, sexual dysfunction, sleep disturbances, urinary incontinence, urinary urgency, vaginal discharge and vaginal itching. Diet regular.The patient states her exercise level is  limited due to chronic hip pain. Recently received hip injection - she has noticed decreased pain. Plans to resume exercise in the near future.   . The patient's tobacco use is:  Social History   Tobacco Use  Smoking Status Never   Smokeless Tobacco Never  . She has been exposed to passive smoke. The patient's alcohol use is:  Social History   Substance and Sexual Activity  Alcohol Use Not Currently   Alcohol/week: 2.0 standard drinks of alcohol   Types: 2 Glasses of wine per week   Comment: 8 oz occasionally per week    Review of Systems  Constitutional: Negative.   HENT: Negative.    Eyes: Negative.   Respiratory: Negative.  Negative for shortness of breath.   Cardiovascular: Negative.  Negative for chest pain.  Gastrointestinal: Negative.   Endocrine: Negative.  Negative for polydipsia, polyphagia and polyuria.  Genitourinary: Negative.   Musculoskeletal: Negative.   Skin: Negative.   Allergic/Immunologic: Negative.   Neurological: Negative.   Hematological: Negative.  Psychiatric/Behavioral: Negative.       Today's Vitals   10/08/23 0856  BP: 132/84  Pulse: 72  Temp: 97.9 F (36.6 C)  SpO2: 98%  Weight: 160 lb 9.6 oz (72.8 kg)  Height: 5' (1.524 m)   Body mass index is 31.37 kg/m.  Wt Readings from Last 3 Encounters:  10/08/23 160 lb 9.6 oz (72.8 kg)  07/29/23 156 lb 3.2 oz (70.9 kg)  05/28/23 150 lb 12.8 oz (68.4 kg)     Objective:  Physical Exam Vitals and nursing note reviewed. Exam conducted with a chaperone present.  Constitutional:      Appearance: Normal appearance. She is obese.  HENT:     Head: Normocephalic and atraumatic.     Right Ear: Tympanic membrane, ear canal and external ear normal.     Left Ear: Tympanic membrane, ear canal and external ear normal.     Nose: Nose normal.     Mouth/Throat:     Mouth: Mucous membranes are moist.     Pharynx: Oropharynx is clear.  Eyes:     Extraocular Movements: Extraocular movements intact.     Conjunctiva/sclera: Conjunctivae normal.     Pupils: Pupils are equal, round, and reactive to light.  Cardiovascular:     Rate and Rhythm: Normal rate and regular rhythm.     Pulses: Normal pulses.          Dorsalis pedis pulses  are 2+ on the right side and 2+ on the left side.     Heart sounds: Normal heart sounds.  Pulmonary:     Effort: Pulmonary effort is normal.     Breath sounds: Normal breath sounds.  Chest:  Breasts:    Tanner Score is 5.     Comments: S/p mastectomy on the left Healed surgical scars on the right.  Keloid right upr anterior thorax, no surrounding erythema Abdominal:     General: Bowel sounds are normal.     Palpations: Abdomen is soft.     Hernia: There is no hernia in the left inguinal area or right inguinal area.  Genitourinary:    General: Normal vulva.     Exam position: Lithotomy position.     Tanner stage (genital): 5.     Labia:        Right: No tenderness.        Left: No tenderness.      Vagina: Normal.     Cervix: Normal.     Uterus: Normal.      Adnexa:        Right: No mass.         Left: No mass.       Rectum: Guaiac result negative. No mass or tenderness.     Comments: deferred Musculoskeletal:        General: Normal range of motion.     Cervical back: Normal range of motion and neck supple.  Feet:     Right foot:     Protective Sensation: 5 sites tested.  5 sites sensed.     Skin integrity: Skin integrity normal.     Toenail Condition: Right toenails are normal.     Left foot:     Protective Sensation: 5 sites tested.  5 sites sensed.     Skin integrity: Skin integrity normal.     Toenail Condition: Left toenails are normal.  Lymphadenopathy:     Lower Body: No right inguinal adenopathy. No left inguinal adenopathy.  Skin:    General: Skin  is warm and dry.     Comments: Scattered areas of dry, scaly skin  Neurological:     General: No focal deficit present.     Mental Status: She is alert and oriented to person, place, and time.  Psychiatric:        Mood and Affect: Mood normal.        Behavior: Behavior normal.         Assessment And Plan:     Annual physical exam Assessment & Plan: A full exam was performed.  Importance of monthly self  breast exams was discussed with the patient.  She is advised to get 30-45 minutes of regular exercise, no less than four to five days per week. Both weight-bearing and aerobic exercises are recommended.  She is advised to follow a healthy diet with at least six fruits/veggies per day, decrease intake of red meat and other saturated fats and to increase fish intake to twice weekly.  Meats/fish should not be fried -- baked, boiled or broiled is preferable. It is also important to cut back on your sugar intake.  Be sure to read labels - try to avoid anything with added sugar, high fructose corn syrup or other sweeteners.  If you must use a sweetener, you can try stevia or monkfruit.  It is also important to avoid artificially sweetened foods/beverages and diet drinks. Lastly, wear SPF 50 sunscreen on exposed skin and when in direct sunlight for an extended period of time.  Be sure to avoid fast food restaurants and aim for at least 60 ounces of water daily.       Type 2 diabetes mellitus with stage 3a chronic kidney disease, without long-term current use of insulin (HCC) Assessment & Plan: Chronic, her last A1c was 5.7 in November 2024.  Diabetic foot exam was performed.  She will c/w semaglutide 0.5mg  weekly. Importance of dietary/medication compliance was discussed with the patient. She was congratulated on the lifestyle changes she has made thus far. She will f/u in 3-4 months. I DISCUSSED WITH THE PATIENT AT LENGTH REGARDING THE GOALS OF GLYCEMIC CONTROL AND POSSIBLE LONG-TERM COMPLICATIONS.  I  ALSO STRESSED THE IMPORTANCE OF COMPLIANCE WITH HOME GLUCOSE MONITORING, DIETARY RESTRICTIONS INCLUDING AVOIDANCE OF SUGARY DRINKS/PROCESSED FOODS,  ALONG WITH REGULAR EXERCISE.  I  ALSO STRESSED THE IMPORTANCE OF ANNUAL EYE EXAMS, SELF FOOT CARE AND COMPLIANCE WITH OFFICE VISITS.   Orders: -     CBC -     Lipid panel -     Hemoglobin A1c -     Hepatic function panel  Hypertensive nephropathy Assessment &  Plan: Chronic, fair control. Goal BP<120/80.  EKG performed, NSR w/o acute changes.  She will continue with hydrochlorothiazide 25mg  daily, losartan 100mg  daily and metoprolol XL 25mg  daily.  She is encouraged to follow low sodium diet. She will f/u in four months for re-evaluation.  Orders: -     EKG 12-Lead -     POCT urinalysis dipstick -     Microalbumin / creatinine urine ratio  Pure hypercholesterolemia Assessment & Plan: Chronic, LDL goal < 70 due to underlying diabetes. She will c/w atorvastatin 40mg  daily. She is encouraged to follow a heart healthy lifestyle.    Dry skin dermatitis Assessment & Plan: She is encouraged to stay well hydrated and to incorporate more healthy fats into her diet. Some examples include olive oil, walnuts, almonds, fatty fish like salmon and sardines, and avocados/guacamole.    Estrogen deficiency Assessment & Plan: She is  scheduled for bone density on June 2025. She is encouraged to engage in weight-bearing exercises at least three days weekly and to continue with calcium/vitamin d supplementation.    Class 1 obesity due to excess calories with serious comorbidity and body mass index (BMI) of 31.0 to 31.9 in adult Assessment & Plan: She is encouraged to aim for at least 150 minutes of exercise per day to achieve BMI<30 to decrease cardiac risk.    She is encouraged to strive for BMI less than 30 to decrease cardiac risk. Advised to aim for at least 150 minutes of exercise per week.    Return for 1 year physical, 4 month DM. Patient was given opportunity to ask questions. Patient verbalized understanding of the plan and was able to repeat key elements of the plan. All questions were answered to their satisfaction.    I, Gwynneth Aliment, MD, have reviewed all documentation for this visit. The documentation on 10/08/23 for the exam, diagnosis, procedures, and orders are all accurate and complete.

## 2023-10-09 LAB — CBC
Hematocrit: 34.6 % (ref 34.0–46.6)
Hemoglobin: 11.7 g/dL (ref 11.1–15.9)
MCH: 32.5 pg (ref 26.6–33.0)
MCHC: 33.8 g/dL (ref 31.5–35.7)
MCV: 96 fL (ref 79–97)
Platelets: 221 10*3/uL (ref 150–450)
RBC: 3.6 x10E6/uL — ABNORMAL LOW (ref 3.77–5.28)
RDW: 10.8 % — ABNORMAL LOW (ref 11.7–15.4)
WBC: 13.3 10*3/uL — ABNORMAL HIGH (ref 3.4–10.8)

## 2023-10-09 LAB — HEPATIC FUNCTION PANEL
ALT: 26 IU/L (ref 0–32)
AST: 20 IU/L (ref 0–40)
Albumin: 4.3 g/dL (ref 3.8–4.8)
Alkaline Phosphatase: 86 IU/L (ref 44–121)
Bilirubin Total: 0.3 mg/dL (ref 0.0–1.2)
Bilirubin, Direct: 0.14 mg/dL (ref 0.00–0.40)
Total Protein: 7.5 g/dL (ref 6.0–8.5)

## 2023-10-09 LAB — MICROALBUMIN / CREATININE URINE RATIO
Creatinine, Urine: 39.7 mg/dL
Microalb/Creat Ratio: <8 mg/g{creat} (ref 0–29)
Microalbumin, Urine: <3 ug/mL

## 2023-10-09 LAB — LIPID PANEL
Chol/HDL Ratio: 2.4 ratio (ref 0.0–4.4)
Cholesterol, Total: 152 mg/dL (ref 100–199)
HDL: 64 mg/dL (ref >39)
LDL Chol Calc (NIH): 76 mg/dL (ref 0–99)
Triglycerides: 57 mg/dL (ref 0–149)
VLDL Cholesterol Cal: 12 mg/dL (ref 5–40)

## 2023-10-09 LAB — HEMOGLOBIN A1C
Est. average glucose Bld gHb Est-mCnc: 120 mg/dL
Hgb A1c MFr Bld: 5.8 % — ABNORMAL HIGH (ref 4.8–5.6)

## 2023-10-11 ENCOUNTER — Encounter: Payer: Self-pay | Admitting: Internal Medicine

## 2023-10-18 DIAGNOSIS — L853 Xerosis cutis: Secondary | ICD-10-CM | POA: Insufficient documentation

## 2023-10-18 NOTE — Assessment & Plan Note (Signed)
 She is scheduled for bone density on June 2025. She is encouraged to engage in weight-bearing exercises at least three days weekly and to continue with calcium/vitamin d supplementation.

## 2023-10-18 NOTE — Assessment & Plan Note (Signed)
 She is encouraged to stay well hydrated and to incorporate more healthy fats into her diet. Some examples include olive oil, walnuts, almonds, fatty fish like salmon and sardines, and avocados/guacamole.

## 2023-10-18 NOTE — Assessment & Plan Note (Signed)
 She is encouraged to aim for at least 150 minutes of exercise per day to achieve BMI<30 to decrease cardiac risk.

## 2023-10-18 NOTE — Assessment & Plan Note (Signed)

## 2023-10-18 NOTE — Assessment & Plan Note (Addendum)
 Chronic, her last A1c was 5.7 in November 2024.  Diabetic foot exam was performed.  She will c/w semaglutide 0.5mg  weekly. Importance of dietary/medication compliance was discussed with the patient. She was congratulated on the lifestyle changes she has made thus far. She will f/u in 3-4 months. I DISCUSSED WITH THE PATIENT AT LENGTH REGARDING THE GOALS OF GLYCEMIC CONTROL AND POSSIBLE LONG-TERM COMPLICATIONS.  I  ALSO STRESSED THE IMPORTANCE OF COMPLIANCE WITH HOME GLUCOSE MONITORING, DIETARY RESTRICTIONS INCLUDING AVOIDANCE OF SUGARY DRINKS/PROCESSED FOODS,  ALONG WITH REGULAR EXERCISE.  I  ALSO STRESSED THE IMPORTANCE OF ANNUAL EYE EXAMS, SELF FOOT CARE AND COMPLIANCE WITH OFFICE VISITS.

## 2023-10-18 NOTE — Assessment & Plan Note (Signed)
Chronic, LDL goal < 70 due to underlying diabetes. She will c/w atorvastatin 40mg  daily. She is encouraged to follow a heart healthy lifestyle.

## 2023-10-18 NOTE — Assessment & Plan Note (Signed)
 Chronic, fair control. Goal BP<120/80.  EKG performed, NSR w/o acute changes.  She will continue with hydrochlorothiazide 25mg  daily, losartan 100mg  daily and metoprolol XL 25mg  daily.  She is encouraged to follow low sodium diet. She will f/u in four months for re-evaluation.

## 2023-10-22 ENCOUNTER — Encounter: Payer: Self-pay | Admitting: Sports Medicine

## 2023-10-22 ENCOUNTER — Other Ambulatory Visit: Payer: Self-pay

## 2023-10-22 ENCOUNTER — Ambulatory Visit: Admitting: Sports Medicine

## 2023-10-22 DIAGNOSIS — M25552 Pain in left hip: Secondary | ICD-10-CM

## 2023-10-22 DIAGNOSIS — G8929 Other chronic pain: Secondary | ICD-10-CM | POA: Diagnosis not present

## 2023-10-22 DIAGNOSIS — M160 Bilateral primary osteoarthritis of hip: Secondary | ICD-10-CM

## 2023-10-22 MED ORDER — METHYLPREDNISOLONE ACETATE 40 MG/ML IJ SUSP
80.0000 mg | INTRAMUSCULAR | Status: AC | PRN
Start: 2023-10-22 — End: 2023-10-22
  Administered 2023-10-22: 80 mg via INTRA_ARTICULAR

## 2023-10-22 MED ORDER — LIDOCAINE HCL 1 % IJ SOLN
4.0000 mL | INTRAMUSCULAR | Status: AC | PRN
  Administered 2023-10-22: 4 mL

## 2023-10-22 NOTE — Progress Notes (Signed)
 Martha Pham - 73 y.o. female MRN [id number]  Date of birth: 10-01-1950  Office Visit Note: Visit Date: 10/22/2023 PCP: Dorothyann Peng, MD Referred by: Dorothyann Peng, MD  Subjective: Chief Complaint  Patient presents with   Right Hip - Follow-up   Left Hip - Pain   HPI: Martha Pham is a pleasant 73 y.o. female who presents today for follow-up of bilateral hip pain with known osteoarthritis.  Martha Pham states that her left hip is significantly better, she had excellent relief from the injection.  The left hip is not quite as bothersome, but still causes her pain.  She is inquiring about injection into this hip today.  She has done aquatic therapy 3 times weekly in the past, but has stopped over the last few months because of her hip pain.  She is asking about this and other exercise.  Using Tylenol only as needed for pain.  Pertinent ROS were reviewed with the patient and found to be negative unless otherwise specified above in HPI.   Assessment & Plan: Visit Diagnoses:  1. Chronic left hip pain   2. Primary osteoarthritis of both hips    Plan: Impression is bilateral osteoarthritis, with left hip pain and is symptomatic from her OA.  In the past she got good relief from right hip injection, she is interested in trialing this for the left hip today.  Through shared decision making, we did proceed with ultrasound-guided left hip intra-articular injection.  Advised on postinjection protocol.  She will have modified activity for the next 48 to 72 hours and then may return as her pain allows.  She may use ice or Tylenol for any postinjection pain.  I did discuss I thought it was a great idea to get started back in aquatic therapy for both of her hips and overall joints.  Also recommended stationary bike/cycling, elliptical and walking.  She will follow-up with me as needed. Discussed infrequent nature of injections.  She is not interested in any surgical intervention at this  time.  Follow-up: Return in about 3 months (around 01/21/2024), or if symptoms worsen or fail to improve.   Meds & Orders: No orders of the defined types were placed in this encounter.   Orders Placed This Encounter  Procedures   US Guided Needle Placement - No Linked Charges     Procedures: Large Joint Inj: L hip joint on 10/22/2023 10:43 AM Indications: pain Details: 22 G 3.5 in needle, ultrasound-guided anterior approach Medications: 4 mL lidocaine 1 %; 80 mg methylPREDNISolone acetate 40 MG/ML Outcome: tolerated well, no immediate complications  Procedure: US-guided intra-articular hip injection, Left After discussion on risks/benefits/indications and informed verbal consent was obtained, a timeout was performed. Patient was lying supine on exam table. The hip was cleaned with betadine and alcohol swabs. Then utilizing ultrasound guidance, the patient's femoral head and neck junction was identified and subsequently injected with 4:2 lidocaine:depomedrol via an in-plane approach with ultrasound visualization of the injectate administered into the hip joint. Patient tolerated procedure well without immediate complications.  Procedure, treatment alternatives, risks and benefits explained, specific risks discussed. Consent was given by the patient. Immediately prior to procedure a time out was called to verify the correct patient, procedure, equipment, support staff and site/side marked as required. Patient was prepped and draped in the usual sterile fashion.          Clinical History: No specialty comments available.  She reports that she has never smoked. She has never used  smokeless tobacco.  Recent Labs    01/26/23 1547 05/28/23 1617 10/08/23 0948  HGBA1C 5.4 5.7* 5.8*    Objective:    Physical Exam  Gen: Well-appearing, in no acute distress; non-toxic CV: Well-perfused. Warm.  Resp: Breathing unlabored on room air; no wheezing. Psych: Fluid speech in conversation;  appropriate affect; normal thought process  Ortho Exam - Bilateral hips: No redness swelling or effusion.  Right hip moves fluidly without pain range of motion is limited internal > externally bilaterally. + Mild pain provocation with left hip.   Imaging: Narrative & Impression  CLINICAL DATA:  Groin pain, hip pain, range of motion difficulty.   EXAM: DG HIP (WITH OR WITHOUT PELVIS) 3-4V BILAT   COMPARISON:  None Available.   FINDINGS: Superomedial joint space narrowing bilaterally with subchondral sclerosis and osteophytosis, minimally worse on the left. Sacroiliac joints are patent.   IMPRESSION: Moderate bilateral hip osteoarthritis, left slightly greater than right.     Electronically Signed   By: Leanna Battles M.D.   On: 12/05/2022 17:10    Past Medical/Family/Surgical/Social History: Medications & Allergies reviewed per EMR, new medications updated. Patient Active Problem List   Diagnosis Date Noted   Dry skin dermatitis 10/18/2023   Annual physical exam 10/08/2023   Left elbow tendinitis 08/01/2023   Counseling about travel 08/01/2023   Estrogen deficiency 05/28/2023   Essential hypertension 01/26/2023   Primary osteoarthritis of both hips 01/26/2023   RLS (restless legs syndrome) 01/26/2023   Pure hypercholesterolemia 04/06/2022   Palpitations 04/06/2022   Personal history of COVID-19 04/06/2022   Mixed hyperlipidemia 06/06/2021   Class 1 obesity due to excess calories with serious comorbidity and body mass index (BMI) of 31.0 to 31.9 in adult 06/06/2021   Keloid 06/06/2021   History of breast cancer 11/23/2020   Type 2 diabetes mellitus with stage 3a chronic kidney disease, without long-term current use of insulin (HCC) 06/22/2018   Chronic renal disease, stage II 06/22/2018   Hypertensive nephropathy 06/22/2018   Overweight with body mass index (BMI) of 29 to 29.9 in adult 06/22/2018   Pain in joint, pelvic region and thigh 03/28/2014   Acquired  absence of breast and absent nipple 05/05/2012   Past Medical History:  Diagnosis Date   Apnea, sleep    does wear cpap   Arthritis    Cancer (HCC)    lt. breast ca-snbx   Chronic kidney disease    Contact lens/glasses fitting    wears contacts or glasses   COPD (chronic obstructive pulmonary disease) (HCC)    brother   Heart murmur    Hypercholesteremia    Hyperlipemia    Hypertension    Lower back pain    Lung cancer (HCC)    Malaise and fatigue    Personal history of chemotherapy 2013   Sleep apnea    SLEEP STUDY DX 10,CPAP BUT DON'TUSE   Vitamin D deficiency    Wears dentures    upper   Family History  Problem Relation Age of Onset   Heart attack Mother    Brain cancer Father    Breast cancer Sister 22   Breast cancer Sister 62   Sleep apnea Neg Hx    Past Surgical History:  Procedure Laterality Date   BREAST BIOPSY Left 05/26/2011   malignant   BREAST BIOPSY Right 10/29/2016   BREAST IMPLANT EXCHANGE Left 09/23/2012   Procedure: REMOVAL OF LEFT EXPANDER/PLACEMENT OF IMPLANT WITH REDUCTION OF RIGHT BREAST;  Surgeon: Wayland Denis, DO;  Location: [facility];  Service: Plastics;  Laterality: Left;   BREAST LUMPECTOMY W/ NEEDLE LOCALIZATION  06/24/2011   left   BREAST REDUCTION SURGERY Right 09/23/2012   Procedure: MAMMARY REDUCTION  (BREAST);  Surgeon: Wayland Denis, DO;  Location: [facility];  Service: Plastics;  Laterality: Right;   BREAST SURGERY  08/07/2011   re-exc. left breast margins   CATARACT EXTRACTION Bilateral    january and february 2022   CYST REMOVAL NECK  08/2013   Dr. Kelly Splinter   LIPOSUCTION Bilateral 09/23/2012   Procedure: LIPOSUCTION;  Surgeon: Wayland Denis, DO;  Location: [facility];  Service: Plastics;  Laterality: Bilateral;   MASTECTOMY Left 2012   MOUTH SURGERY     TOOTH IMPLANT BOTTOM X2 LFT   PORT-A-CATH REMOVAL Right 09/23/2012   Procedure: REMOVAL PORT-A-CATH;  Surgeon: Wayland Denis,  DO;  Location: [facility];  Service: Plastics;  Laterality: Right;   PORTACATH PLACEMENT  06/24/2011   Procedure: INSERTION PORT-A-CATH;  Surgeon: Rulon Abide, DO;  Location: [facility];  Service: General;  Laterality: Right;  Right mediport placement   RECONSTRUCTION BREAST IMMEDIATE / DELAYED W/ TISSUE EXPANDER  04/2012   left   REDUCTION MAMMAPLASTY Right 2013   SIMPLE MASTECTOMY  09/01/2011   left   TONSILLECTOMY     tooth implant removed     10/2021   Social History   Occupational History   Occupation: Retired  Tobacco Use   Smoking status: Never   Smokeless tobacco: Never  Vaping Use   Vaping status: Never Used  Substance and Sexual Activity   Alcohol use: Not Currently    Alcohol/week: 2.0 standard drinks of alcohol    Types: 2 Glasses of wine per week    Comment: 8 oz occasionally per week   Drug use: No   Sexual activity: Not Currently    Partners: Male    Birth control/protection: Post-menopausal

## 2023-10-22 NOTE — Progress Notes (Signed)
 Patient says that the injection for her right hip has really helped and she is happy with her relief. She says that the left hip is also feeling a bit better, but she is still interested in injection into the left hip today. She says that her pain is still in the front of the hip and into the groin.

## 2023-12-02 NOTE — Progress Notes (Unsigned)
 [facility] Cancer Follow up:    Martha Curling, MD [address] [place] [address]   DIAGNOSIS: History of breast cancer   SUMMARY OF ONCOLOGIC HISTORY: Oncology History  Breast cancer of upper-inner quadrant of left female breast (HCC) (Resolved)  06/12/2011 Initial Diagnosis   Cancer of upper-inner quadrant of female breast: Grade 3 IDC ER negative PR negative HER-2 negative, MRI 2.7 x 2.3 x 1 cm mass   06/24/2011 Surgery   Left breast lumpectomy 2.2 cm IDC grade 3 with high-grade DCIS triple negative, for positive margins patient had reexcision: 2 lymph nodes negative ER/PR negative HER-2 negative ratio 0.97 Ki-67 75%   09/02/2011 Surgery   Left mastectomy   10/01/2011 - 12/03/2011 Chemotherapy   Adjuvant Taxotere  and Cytoxan  x4 cycles   07/05/2012 Surgery   Breast reconstruction on the left breast with reduction of right breast     CURRENT THERAPY: Observation  INTERVAL HISTORY:  Martha Pham 73 y.o. female returns for follow-up and evaluation of her history of breast cancer.  She underwent a right breast screening mammogram on October 01, 2023 that demonstrated no mammographic evidence of malignancy and breast density category B.  She denies breast changes.  Not exercising, she has hip issues and needs hip replacement bilaterally, getting injections that are helping.     Patient Active Problem List   Diagnosis Date Noted   Dry skin dermatitis 10/18/2023   Annual physical exam 10/08/2023   Left elbow tendinitis 08/01/2023   Counseling about travel 08/01/2023   Estrogen deficiency 05/28/2023   Essential hypertension 01/26/2023   Primary osteoarthritis of both hips 01/26/2023   RLS (restless legs syndrome) 01/26/2023   Pure hypercholesterolemia 04/06/2022   Palpitations 04/06/2022   Personal history of COVID-19 04/06/2022   Mixed hyperlipidemia 06/06/2021   Class 1 obesity due to excess calories with serious comorbidity and body mass  index (BMI) of 31.0 to 31.9 in adult 06/06/2021   Keloid 06/06/2021   History of breast cancer 11/23/2020   Type 2 diabetes mellitus with stage 3a chronic kidney disease, without long-term current use of insulin (HCC) 06/22/2018   Chronic renal disease, stage II 06/22/2018   Hypertensive nephropathy 06/22/2018   Overweight with body mass index (BMI) of 29 to 29.9 in adult 06/22/2018   Pain in joint, pelvic region and thigh 03/28/2014   Acquired absence of breast and absent nipple 05/05/2012    is allergic to dust mite extract and other.  MEDICAL HISTORY: Past Medical History:  Diagnosis Date   Apnea, sleep    does wear cpap   Arthritis    Cancer (HCC)    lt. breast ca-snbx   Chronic kidney disease    Contact lens/glasses fitting    wears contacts or glasses   COPD (chronic obstructive pulmonary disease) (HCC)    brother   Heart murmur    Hypercholesteremia    Hyperlipemia    Hypertension    Lower back pain    Lung cancer (HCC)    Malaise and fatigue    Personal history of chemotherapy 2013   Sleep apnea    SLEEP STUDY DX 10,CPAP BUT DON'TUSE   Vitamin D  deficiency    Wears dentures    upper    SURGICAL HISTORY: Past Surgical History:  Procedure Laterality Date   BREAST BIOPSY Left 05/26/2011   malignant   BREAST BIOPSY Right 10/29/2016   BREAST IMPLANT EXCHANGE Left 09/23/2012   Procedure: REMOVAL OF LEFT EXPANDER/PLACEMENT OF  IMPLANT WITH REDUCTION OF RIGHT BREAST;  Surgeon: Marilou Showman, DO;  Location: [facility];  Service: Plastics;  Laterality: Left;   BREAST LUMPECTOMY W/ NEEDLE LOCALIZATION  06/24/2011   left   BREAST REDUCTION SURGERY Right 09/23/2012   Procedure: MAMMARY REDUCTION  (BREAST);  Surgeon: Marilou Showman, DO;  Location: [facility];  Service: Plastics;  Laterality: Right;   BREAST SURGERY  08/07/2011   re-exc. left breast margins   CATARACT EXTRACTION Bilateral    january and february 2022   CYST REMOVAL NECK   08/2013   Dr. Clay Cummins   LIPOSUCTION Bilateral 09/23/2012   Procedure: LIPOSUCTION;  Surgeon: Marilou Showman, DO;  Location: [facility];  Service: Plastics;  Laterality: Bilateral;   MASTECTOMY Left 2012   MOUTH SURGERY     TOOTH IMPLANT BOTTOM X2 LFT   PORT-A-CATH REMOVAL Right 09/23/2012   Procedure: REMOVAL PORT-A-CATH;  Surgeon: Marilou Showman, DO;  Location: [facility];  Service: Plastics;  Laterality: Right;   PORTACATH PLACEMENT  06/24/2011   Procedure: INSERTION PORT-A-CATH;  Surgeon: Gorman Laughter, DO;  Location: [facility];  Service: General;  Laterality: Right;  Right mediport placement   RECONSTRUCTION BREAST IMMEDIATE / DELAYED W/ TISSUE EXPANDER  04/2012   left   REDUCTION MAMMAPLASTY Right 2013   SIMPLE MASTECTOMY  09/01/2011   left   TONSILLECTOMY     tooth implant removed     10/2021    SOCIAL HISTORY: Social History   Socioeconomic History   Marital status: Widowed    Spouse name: Not on file   Number of children: 1   Years of education: 14   Highest education level: Not on file  Occupational History   Occupation: Retired  Tobacco Use   Smoking status: Never   Smokeless tobacco: Never  Vaping Use   Vaping status: Never Used  Substance and Sexual Activity   Alcohol use: Not Currently    Alcohol/week: 2.0 standard drinks of alcohol    Types: 2 Glasses of wine per week    Comment: 8 oz occasionally per week   Drug use: No   Sexual activity: Not Currently    Partners: Male    Birth control/protection: Post-menopausal  Other Topics Concern   Not on file  Social History Narrative   Daughter [place], [place]   Social Drivers of Health   Financial Resource Strain: Low Risk  (07/03/2023)   Overall Financial Resource Strain (CARDIA)    Difficulty of Paying Living Expenses: Not hard at all  Food Insecurity: No Food Insecurity (07/03/2023)   Hunger Vital Sign    Worried About Running Out of Food in the Last  Year: Never true    Ran Out of Food in the Last Year: Never true  Transportation Needs: No Transportation Needs (07/03/2023)   PRAPARE - Administrator, Civil Service (Medical): No    Lack of Transportation (Non-Medical): No  Physical Activity: Insufficiently Active (07/03/2023)   Exercise Vital Sign    Days of Exercise per Week: 3 days    Minutes of Exercise per Session: 30 min  Stress: No Stress Concern Present (07/03/2023)   [employer] of Occupational Health - Occupational Stress Questionnaire    Feeling of Stress : Not at all  Social Connections: Moderately Isolated (07/03/2023)   Social Connection and Isolation Panel [NHANES]    Frequency of Communication with Friends and Family: Once a week    Frequency of Social Gatherings  with Friends and Family: Once a week    Attends Religious Services: More than 4 times per year    Active Member of Clubs or Organizations: Yes    Attends Banker Meetings: 1 to 4 times per year    Marital Status: Widowed  Intimate Partner Violence: Not At Risk (07/03/2023)   Humiliation, Afraid, Rape, and Kick questionnaire    Fear of Current or Ex-Partner: No    Emotionally Abused: No    Physically Abused: No    Sexually Abused: No    FAMILY HISTORY: Family History  Problem Relation Age of Onset   Heart attack Mother    Brain cancer Father    Breast cancer Sister 33   Breast cancer Sister 37   Sleep apnea Neg Hx     Review of Systems  Constitutional:  Negative for appetite change, chills, fatigue, fever and unexpected weight change.  HENT:   Negative for hearing loss, lump/mass and trouble swallowing.   Eyes:  Negative for eye problems and icterus.  Respiratory:  Negative for chest tightness, cough and shortness of breath.   Cardiovascular:  Negative for chest pain, leg swelling and palpitations.  Gastrointestinal:  Negative for abdominal distention, abdominal pain, constipation, diarrhea, nausea and vomiting.   Endocrine: Negative for hot flashes.  Genitourinary:  Negative for difficulty urinating.   Musculoskeletal:  Negative for arthralgias.  Skin:  Negative for itching and rash.  Neurological:  Negative for dizziness, extremity weakness, headaches and numbness.  Hematological:  Negative for adenopathy. Does not bruise/bleed easily.  Psychiatric/Behavioral:  Negative for depression. The patient is not nervous/anxious.       PHYSICAL EXAMINATION    Vitals:   12/03/23 1103  BP: (!) 171/75  Pulse: 72  Resp: 16  Temp: 97.9 F (36.6 C)  SpO2: 100%    Physical Exam Constitutional:      General: She is not in acute distress.    Appearance: Normal appearance. She is not toxic-appearing.  HENT:     Head: Normocephalic and atraumatic.     Mouth/Throat:     Mouth: Mucous membranes are moist.     Pharynx: Oropharynx is clear. No oropharyngeal exudate or posterior oropharyngeal erythema.  Eyes:     General: No scleral icterus. Cardiovascular:     Rate and Rhythm: Normal rate and regular rhythm.     Pulses: Normal pulses.     Heart sounds: Normal heart sounds.  Pulmonary:     Effort: Pulmonary effort is normal.     Breath sounds: Normal breath sounds.  Chest:     Comments: Breast status postmastectomy and reconstruction no sign of local recurrence right breast status post reduction, benign Abdominal:     General: Abdomen is flat. Bowel sounds are normal. There is no distension.     Palpations: Abdomen is soft.     Tenderness: There is no abdominal tenderness.  Musculoskeletal:        General: No swelling.     Cervical back: Neck supple.  Lymphadenopathy:     Cervical: No cervical adenopathy.     Upper Body:     Right upper body: No supraclavicular or axillary adenopathy.     Left upper body: No supraclavicular or axillary adenopathy.  Skin:    General: Skin is warm and dry.     Findings: No rash.  Neurological:     General: No focal deficit present.     Mental Status: She  is alert.  Psychiatric:  Mood and Affect: Mood normal.        Behavior: Behavior normal.       ASSESSMENT and THERAPY PLAN:   History of breast cancer Sheree is a 73 year old woman with history of left breast triple negative breast cancer diagnosed in December 2012 status post lumpectomy followed by mastectomy and adjuvant chemotherapy.  History of left breast cancer: Kanylah has no clinical or radiographic signs of breast cancer recurrence.  She will continue with annual right breast mammograms. Health maintenance: I recommended that she continue follow-up with her primary care provider regularly.  I also recommended continued healthy diet and exercise.  Her blood pressure was slightly elevated today and she will keep an eye on that outside of here and follow-up with her PCP if needed.  I will see Beatric back in 1 year for continued long term f/u.       All questions were answered. The patient knows to call the clinic with any problems, questions or concerns. We can certainly see the patient much sooner if necessary.  Total encounter time:20 minutes*in face-to-face visit time, chart review, lab review, care coordination, order entry, and documentation of the encounter time.    Alwin Baars, NP 12/04/23 4:24 PM Medical Oncology and Hematology [facility] [address] [place], [place] [address] Tel. [phone]    Fax. [phone]  *Total Encounter Time as defined by the Centers for Medicare and Medicaid Services includes, in addition to the face-to-face time of a patient visit (documented in the note above) non-face-to-face time: obtaining and reviewing outside history, ordering and reviewing medications, tests or procedures, care coordination (communications with other health care professionals or caregivers) and documentation in the medical record.

## 2023-12-03 ENCOUNTER — Inpatient Hospital Stay: Payer: Medicare Other | Attending: Adult Health | Admitting: Adult Health

## 2023-12-03 VITALS — BP 171/75 | HR 72 | Temp 97.9°F | Resp 16 | Wt 157.1 lb

## 2023-12-03 DIAGNOSIS — Z803 Family history of malignant neoplasm of breast: Secondary | ICD-10-CM | POA: Insufficient documentation

## 2023-12-03 DIAGNOSIS — Z9221 Personal history of antineoplastic chemotherapy: Secondary | ICD-10-CM | POA: Insufficient documentation

## 2023-12-03 DIAGNOSIS — Z853 Personal history of malignant neoplasm of breast: Secondary | ICD-10-CM | POA: Diagnosis not present

## 2023-12-03 DIAGNOSIS — Z9012 Acquired absence of left breast and nipple: Secondary | ICD-10-CM | POA: Diagnosis not present

## 2023-12-03 DIAGNOSIS — Z808 Family history of malignant neoplasm of other organs or systems: Secondary | ICD-10-CM | POA: Diagnosis not present

## 2023-12-04 NOTE — Assessment & Plan Note (Signed)
 Martha Pham is a 73 year old woman with history of left breast triple negative breast cancer diagnosed in December 2012 status post lumpectomy followed by mastectomy and adjuvant chemotherapy.  History of left breast cancer: [name] has no clinical or radiographic signs of breast cancer recurrence.  She will continue with annual right breast mammograms. Health maintenance: I recommended that she continue follow-up with her primary care provider regularly.  I also recommended continued healthy diet and exercise.  Her blood pressure was slightly elevated today and she will keep an eye on that outside of here and follow-up with her PCP if needed.  I will see [name] back in 1 year for continued long term f/u.

## 2024-01-13 ENCOUNTER — Telehealth: Payer: Self-pay | Admitting: Internal Medicine

## 2024-01-13 NOTE — Telephone Encounter (Unsigned)
 Copied from CRM [id number]. Topic: Referral - Request for Referral >> Jan 13, 2024  9:58 AM Donee H wrote: Did the patient discuss referral with their provider in the last year? Yes, patient stated places that were originally suggested are not taking new patients. This particular place is accepting patients but need a referral directly from doctor. Patient is asking if this can be done as soon as possible  (If No - schedule appointment) (If Yes - send message)  Appointment offered? No  Type of order/referral and detailed reason for visit: Bone Density  Preference of office, provider, location: [facility]  [phone]  If referral order, have you been seen by this specialty before? No (If Yes, this issue or another issue? When? Where?  Can we respond through MyChart? Yes

## 2024-01-14 ENCOUNTER — Other Ambulatory Visit: Payer: Medicare Other

## 2024-01-21 ENCOUNTER — Other Ambulatory Visit (HOSPITAL_BASED_OUTPATIENT_CLINIC_OR_DEPARTMENT_OTHER)

## 2024-01-25 ENCOUNTER — Other Ambulatory Visit: Payer: Self-pay | Admitting: Internal Medicine

## 2024-01-25 DIAGNOSIS — E2839 Other primary ovarian failure: Secondary | ICD-10-CM

## 2024-01-31 ENCOUNTER — Other Ambulatory Visit: Payer: Self-pay | Admitting: Internal Medicine

## 2024-02-18 ENCOUNTER — Ambulatory Visit (INDEPENDENT_AMBULATORY_CARE_PROVIDER_SITE_OTHER): Admitting: Internal Medicine

## 2024-02-18 ENCOUNTER — Encounter: Payer: Self-pay | Admitting: Internal Medicine

## 2024-02-18 VITALS — BP 128/70 | HR 72 | Temp 98.1°F | Ht 60.0 in | Wt 160.6 lb

## 2024-02-18 DIAGNOSIS — D649 Anemia, unspecified: Secondary | ICD-10-CM | POA: Diagnosis not present

## 2024-02-18 DIAGNOSIS — E6609 Other obesity due to excess calories: Secondary | ICD-10-CM

## 2024-02-18 DIAGNOSIS — E66811 Obesity, class 1: Secondary | ICD-10-CM

## 2024-02-18 DIAGNOSIS — E7800 Pure hypercholesterolemia, unspecified: Secondary | ICD-10-CM

## 2024-02-18 DIAGNOSIS — N1831 Chronic kidney disease, stage 3a: Secondary | ICD-10-CM | POA: Diagnosis not present

## 2024-02-18 DIAGNOSIS — I129 Hypertensive chronic kidney disease with stage 1 through stage 4 chronic kidney disease, or unspecified chronic kidney disease: Secondary | ICD-10-CM | POA: Diagnosis not present

## 2024-02-18 DIAGNOSIS — E1122 Type 2 diabetes mellitus with diabetic chronic kidney disease: Secondary | ICD-10-CM | POA: Diagnosis not present

## 2024-02-18 DIAGNOSIS — Z6831 Body mass index (BMI) 31.0-31.9, adult: Secondary | ICD-10-CM

## 2024-02-18 DIAGNOSIS — K047 Periapical abscess without sinus: Secondary | ICD-10-CM | POA: Diagnosis not present

## 2024-02-18 NOTE — Progress Notes (Unsigned)
 I,Martha Pham, CMA,acting as a Neurosurgeon for Martha LOISE Slocumb, MD.,have documented all relevant documentation on the behalf of Martha LOISE Slocumb, MD,as directed by  Martha LOISE Slocumb, MD while in the presence of Martha LOISE Slocumb, MD.  Subjective:  Patient ID: Martha Pham , female    DOB: 22-Jul-1950 , 73 y.o.   MRN: [id number]  Chief Complaint  Patient presents with   Diabetes    Patient presents today for dm, bo & cholesterol follow up. She reports compliance with medications. Denies headache, chest pain & sob. She has no specific questions or concerns.    Hypertension   Hyperlipidemia    HPI Discussed the use of AI scribe software for clinical note transcription with the patient, who gave verbal consent to proceed.  History of Present Illness Martha Pham is a 73 year old female with diabetes and hypertension who presents for management of her conditions and concerns about a dental abscess.  She has been monitoring her blood sugar levels less frequently, with occasional high readings, though she cannot recall the exact numbers. The last reading she remembers was notably high. Her current medication for diabetes management is Ozempic  0.5 mg.  She has been experiencing issues with a tooth, which was significantly swollen last week. She visited her dentist, who prescribed amoxicillin for the swelling. She plans to have the tooth removed and gum work done next Thursday. She has been using salt water rinses and Tylenol  for pain management, noting that the swelling decreased by Monday morning.  She reports that she has not been eating well recently. She attributes this partly to having her vegan grandson stay with her for three weeks, which disrupted her routine. She has not been going to the gym, which she feels has negatively impacted her health. Her current medications for blood pressure include hydrochlorothiazide , losartan , and metoprolol .  She recalls a previous visit in March where her  white blood cell count was elevated, but she was not aware of any infection at that time. She has not had any recent updates from her kidney doctor since her last visit, which was stable.   Diabetes She presents for her follow-up diabetic visit. She has type 2 diabetes mellitus. Her disease course has been improving. There are no hypoglycemic associated symptoms. Pertinent negatives for diabetes include no chest pain, no polydipsia, no polyphagia and no polyuria. There are no hypoglycemic complications. Risk factors for coronary artery disease include diabetes mellitus, dyslipidemia, hypertension and obesity.  Hypertension This is a chronic problem. The current episode started more than 1 year ago. The problem has been gradually improving since onset. The problem is controlled. Pertinent negatives include no chest pain or shortness of breath. Risk factors for coronary artery disease include diabetes mellitus, dyslipidemia, post-menopausal state, obesity and sedentary lifestyle. The current treatment provides moderate improvement. Compliance problems include exercise.   Hyperlipidemia This is a chronic problem. The current episode started more than 1 year ago. Exacerbating diseases include diabetes and obesity. Pertinent negatives include no chest pain or shortness of breath. Current antihyperlipidemic treatment includes statins.     Past Medical History:  Diagnosis Date   Apnea, sleep    does wear cpap   Arthritis    Cancer (HCC)    lt. breast ca-snbx   Chronic kidney disease    Contact lens/glasses fitting    wears contacts or glasses   COPD (chronic obstructive pulmonary disease) (HCC)    brother   Heart murmur  Hypercholesteremia    Hyperlipemia    Hypertension    Lower back pain    Lung cancer (HCC)    Malaise and fatigue    Personal history of chemotherapy 2013   Sleep apnea    SLEEP STUDY DX 10,CPAP BUT DON'TUSE   Vitamin D  deficiency    Wears dentures    upper      Family History  Problem Relation Age of Onset   Heart attack Mother    Brain cancer Father    Breast cancer Sister 41   Breast cancer Sister 67   Sleep apnea Neg Hx      Current Outpatient Medications:    acetaminophen  (TYLENOL ) 500 MG tablet, Take 1,000 mg by mouth every 6 (six) hours as needed for moderate pain. , Disp: , Rfl:    Ascorbic Acid (VITAMIN C) 1000 MG tablet, Take 1,000 mg by mouth daily., Disp: , Rfl:    atorvastatin  (LIPITOR) 40 MG tablet, Take 1 tablet (40 mg total) by mouth daily., Disp: 90 tablet, Rfl: 2   Blood Glucose Monitoring Suppl (ONETOUCH VERIO) w/Device KIT, USE AS DIRECTED TO CHECK BLOOD SUGARS, Disp: 1 kit, Rfl: 1   calcium  carbonate (OS-CAL) 600 MG TABS, Take 600 mg by mouth 2 (two) times daily with a meal.  , Disp: , Rfl:    Carboxymethylcellulose Sodium (REFRESH OP), Apply 1 drop to eye 2 (two) times daily as needed. For dry eyes, Disp: , Rfl:    Cyanocobalamin (VITAMIN B-12) 2500 MCG SUBL, Place 1 tablet under the tongue daily., Disp: , Rfl:    gabapentin  (NEURONTIN ) 100 MG capsule, Take 1 pill at bedtime as needed for leg pain as needed, Disp: 90 capsule, Rfl: 1   glucose blood (CONTOUR NEXT TEST) test strip, USE AS DIRECTED TO  CHECK  BLOOD  SUGARS  ONCE  A  DAY, Disp: 100 each, Rfl: 0   glucose blood (ONETOUCH VERIO) test strip, Use as instructed, Disp: 100 each, Rfl: 12   hydrochlorothiazide  (HYDRODIURIL ) 25 MG tablet, Take 1 tablet (25 mg total) by mouth daily., Disp: 90 tablet, Rfl: 2   loratadine  (CLARITIN ) 10 MG tablet, Take 1 tablet (10 mg total) by mouth daily. prn, Disp: 30 tablet, Rfl: 2   losartan  (COZAAR ) 100 MG tablet, Take 1 tablet by mouth once daily, Disp: 90 tablet, Rfl: 0   Magnesium 400 MG CAPS, Take by mouth daily. , Disp: , Rfl:    metoprolol  succinate (TOPROL -XL) 25 MG 24 hr tablet, Take 1 tablet (25 mg total) by mouth daily., Disp: 90 tablet, Rfl: 2   Microlet Lancets MISC, USE AS DIRECTED TO  CHECK  BLOOD  SUGARS  ONE  TIME  A   DAY, Disp: 100 each, Rfl: 0   OneTouch Delica Lancets 30G MISC, USE ONCE DAILY TO CHECK BLOOD SUGAR., Disp: 100 each, Rfl: 1   Semaglutide ,0.25 or 0.5MG /DOS, (OZEMPIC , 0.25 OR 0.5 MG/DOSE,) 2 MG/3ML SOPN, Inject 0.5 mg into the skin once a week., Disp: 9 mL, Rfl: 1   Vitamin D , Cholecalciferol, 50 MCG (2000 UT) CAPS, Take by mouth. , Disp: , Rfl:    scopolamine  (TRANSDERM SCOP , 1.5 MG,) 1 MG/3DAYS, Place 1 patch (1.5 mg total) onto the skin every 3 (three) days. As needed (Patient not taking: Reported on 02/18/2024), Disp: 4 patch, Rfl: 1   Allergies  Allergen Reactions   Dust Mite Extract Itching   Other     grass     Review of Systems  Constitutional: Negative.  HENT: Negative.    Respiratory: Negative.  Negative for shortness of breath.   Cardiovascular: Negative.  Negative for chest pain.  Gastrointestinal: Negative.   Endocrine: Negative for polydipsia, polyphagia and polyuria.  Neurological: Negative.   Psychiatric/Behavioral: Negative.       Today's Vitals   02/18/24 0923  BP: 128/70  Pulse: 72  Temp: 98.1 F (36.7 C)  SpO2: 98%  Weight: 160 lb 9.6 oz (72.8 kg)  Height: 5' (1.524 m)   Body mass index is 31.37 kg/m.  Wt Readings from Last 3 Encounters:  02/18/24 160 lb 9.6 oz (72.8 kg)  12/03/23 157 lb 1.6 oz (71.3 kg)  10/08/23 160 lb 9.6 oz (72.8 kg)    BP Readings from Last 3 Encounters:  02/18/24 128/70  12/03/23 (!) 171/75  10/08/23 132/84    Objective:  Physical Exam Vitals and nursing note reviewed.  Constitutional:      Appearance: Normal appearance. She is obese.  HENT:     Head: Normocephalic and atraumatic.     Mouth/Throat:     Pharynx: No oropharyngeal exudate or posterior oropharyngeal erythema.  Eyes:     Extraocular Movements: Extraocular movements intact.  Cardiovascular:     Rate and Rhythm: Normal rate and regular rhythm.     Heart sounds: Normal heart sounds.  Pulmonary:     Effort: Pulmonary effort is normal.     Breath sounds:  Normal breath sounds.  Musculoskeletal:     Cervical back: Normal range of motion.  Skin:    General: Skin is warm.  Neurological:     General: No focal deficit present.     Mental Status: She is alert.  Psychiatric:        Mood and Affect: Mood normal.        Behavior: Behavior normal.       Assessment And Plan:  Type 2 diabetes mellitus with stage 3a chronic kidney disease, without long-term current use of insulin (HCC) Assessment & Plan: Chronic, her last A1c was 5.7 in November 2024.  She will c/w semaglutide  0.5mg  weekly. Importance of dietary/medication compliance was discussed with the patient. She was congratulated on the lifestyle changes she has made thus far. She will f/u in 3-4 months.    Orders: -     CMP14+EGFR -     Hemoglobin A1c -     PTH, intact and calcium  -     Phosphorus -     Protein electrophoresis, serum  Hypertensive nephropathy Assessment & Plan: Chronic, controlled.  She will continue with hydrochlorothiazide  25mg  daily, losartan  100mg  daily and metoprolol  XL 25mg  daily.  She is encouraged to follow low sodium diet. She will f/u in four months for re-evaluation.  Orders: -     CMP14+EGFR  Pure hypercholesterolemia Assessment & Plan: Chronic, LDL goal < 70 due to underlying diabetes. She will c/w atorvastatin  40mg  daily. She is encouraged to follow a heart healthy lifestyle.   Orders: -     CMP14+EGFR  Tooth abscess Assessment & Plan: Swelling and pain reduced with amoxicillin. Scheduled for extraction and gum work. - Continue amoxicillin as prescribed. - Proceed with dental procedure next Thursday.  Orders: -     CBC  Class 1 obesity due to excess calories with serious comorbidity and body mass index (BMI) of 31.0 to 31.9 in adult Assessment & Plan: She is encouraged to aim for at least 150 minutes of exercise per day to achieve BMI<30 to decrease cardiac risk.  Return in about 4 months (around 06/19/2024), or dm check.  Patient  was given opportunity to ask questions. Patient verbalized understanding of the plan and was able to repeat key elements of the plan. All questions were answered to their satisfaction.   I, Martha LOISE Slocumb, MD, have reviewed all documentation for this visit. The documentation on 02/18/24 for the exam, diagnosis, procedures, and orders are all accurate and complete.   IF YOU HAVE BEEN REFERRED TO A SPECIALIST, IT MAY TAKE 1-2 WEEKS TO SCHEDULE/PROCESS THE REFERRAL. IF YOU HAVE NOT HEARD FROM US /SPECIALIST IN TWO WEEKS, PLEASE GIVE US  A CALL AT [phone].   THE PATIENT IS ENCOURAGED TO PRACTICE SOCIAL DISTANCING DUE TO THE COVID-19 PANDEMIC.

## 2024-02-18 NOTE — Patient Instructions (Signed)

## 2024-02-18 NOTE — Assessment & Plan Note (Signed)
 She is encouraged to aim for at least 150 minutes of exercise per day to achieve BMI<30 to decrease cardiac risk.

## 2024-02-18 NOTE — Assessment & Plan Note (Signed)
 Chronic, her last A1c was 5.7 in November 2024.  She will c/w semaglutide  0.5mg  weekly. Importance of dietary/medication compliance was discussed with the patient. She was congratulated on the lifestyle changes she has made thus far. She will f/u in 3-4 months.

## 2024-02-18 NOTE — Assessment & Plan Note (Signed)
Chronic, LDL goal < 70 due to underlying diabetes. She will c/w atorvastatin 40mg  daily. She is encouraged to follow a heart healthy lifestyle.

## 2024-02-18 NOTE — Assessment & Plan Note (Signed)
 Chronic, controlled.  She will continue with hydrochlorothiazide  25mg  daily, losartan  100mg  daily and metoprolol  XL 25mg  daily.  She is encouraged to follow low sodium diet. She will f/u in four months for re-evaluation.

## 2024-02-19 ENCOUNTER — Encounter: Payer: Self-pay | Admitting: Internal Medicine

## 2024-02-19 DIAGNOSIS — K047 Periapical abscess without sinus: Secondary | ICD-10-CM | POA: Insufficient documentation

## 2024-02-19 NOTE — Assessment & Plan Note (Signed)
 Swelling and pain reduced with amoxicillin. Scheduled for extraction and gum work. - Continue amoxicillin as prescribed. - Proceed with dental procedure next Thursday.

## 2024-02-22 ENCOUNTER — Ambulatory Visit: Payer: Self-pay | Admitting: Internal Medicine

## 2024-02-22 LAB — CMP14+EGFR
ALT: 19 IU/L (ref 0–32)
AST: 20 IU/L (ref 0–40)
Albumin: 4 g/dL (ref 3.8–4.8)
Alkaline Phosphatase: 86 IU/L (ref 44–121)
BUN/Creatinine Ratio: 24 (ref 12–28)
BUN: 29 mg/dL — ABNORMAL HIGH (ref 8–27)
Bilirubin Total: 0.3 mg/dL (ref 0.0–1.2)
CO2: 20 mmol/L (ref 20–29)
Calcium: 9.9 mg/dL (ref 8.7–10.3)
Chloride: 99 mmol/L (ref 96–106)
Creatinine, Ser: 1.19 mg/dL — ABNORMAL HIGH (ref 0.57–1.00)
Globulin, Total: 3.2 g/dL (ref 1.5–4.5)
Glucose: 111 mg/dL — ABNORMAL HIGH (ref 70–99)
Potassium: 4 mmol/L (ref 3.5–5.2)
Sodium: 138 mmol/L (ref 134–144)
Total Protein: 7.2 g/dL (ref 6.0–8.5)
eGFR: 48 mL/min/1.73 — ABNORMAL LOW (ref >59)

## 2024-02-22 LAB — PROTEIN ELECTROPHORESIS, SERUM
A/G Ratio: 0.8 (ref 0.7–1.7)
Albumin ELP: 3.1 g/dL (ref 2.9–4.4)
Alpha 1: 0.3 g/dL (ref 0.0–0.4)
Alpha 2: 1.2 g/dL — ABNORMAL HIGH (ref 0.4–1.0)
Beta: 1.2 g/dL (ref 0.7–1.3)
Gamma Globulin: 1.4 g/dL (ref 0.4–1.8)
Globulin, Total: 4.1 g/dL — ABNORMAL HIGH (ref 2.2–3.9)

## 2024-02-22 LAB — CBC
Hematocrit: 32.7 % — ABNORMAL LOW (ref 34.0–46.6)
Hemoglobin: 10.2 g/dL — ABNORMAL LOW (ref 11.1–15.9)
MCH: 31.9 pg (ref 26.6–33.0)
MCHC: 31.2 g/dL — ABNORMAL LOW (ref 31.5–35.7)
MCV: 102 fL — ABNORMAL HIGH (ref 79–97)
Platelets: 294 x10E3/uL (ref 150–450)
RBC: 3.2 x10E6/uL — ABNORMAL LOW (ref 3.77–5.28)
RDW: 11.3 % — ABNORMAL LOW (ref 11.7–15.4)
WBC: 7.3 x10E3/uL (ref 3.4–10.8)

## 2024-02-22 LAB — PTH, INTACT AND CALCIUM: PTH: 36 pg/mL (ref 15–65)

## 2024-02-22 LAB — HEMOGLOBIN A1C
Est. average glucose Bld gHb Est-mCnc: 120 mg/dL
Hgb A1c MFr Bld: 5.8 % — ABNORMAL HIGH (ref 4.8–5.6)

## 2024-02-22 LAB — PHOSPHORUS: Phosphorus: 2.8 mg/dL — ABNORMAL LOW (ref 3.0–4.3)

## 2024-02-24 LAB — SPECIMEN STATUS REPORT

## 2024-02-24 LAB — IRON AND TIBC
Iron Saturation: 24 % (ref 15–55)
Iron: 63 ug/dL (ref 27–139)
Total Iron Binding Capacity: 266 ug/dL (ref 250–450)
UIBC: 203 ug/dL (ref 118–369)

## 2024-02-24 LAB — FERRITIN: Ferritin: 644 ng/mL — ABNORMAL HIGH (ref 15–150)

## 2024-03-08 ENCOUNTER — Telehealth: Payer: Self-pay

## 2024-03-08 NOTE — Telephone Encounter (Signed)
 Sent refill order form for Ozempic  (Novo Nordisk) for review and signature to [employer] office and [facility].

## 2024-03-28 ENCOUNTER — Ambulatory Visit: Payer: Self-pay | Admitting: Internal Medicine

## 2024-03-28 ENCOUNTER — Other Ambulatory Visit (INDEPENDENT_AMBULATORY_CARE_PROVIDER_SITE_OTHER)

## 2024-03-28 DIAGNOSIS — Z1211 Encounter for screening for malignant neoplasm of colon: Secondary | ICD-10-CM | POA: Diagnosis not present

## 2024-03-28 LAB — HEMOCCULT GUIAC POC 1CARD (OFFICE)
Card #2 Fecal Occult Blod, POC: NEGATIVE
Card #3 Fecal Occult Blood, POC: NEGATIVE
Fecal Occult Blood, POC: NEGATIVE

## 2024-03-31 ENCOUNTER — Encounter: Payer: Self-pay | Admitting: Internal Medicine

## 2024-03-31 ENCOUNTER — Ambulatory Visit (INDEPENDENT_AMBULATORY_CARE_PROVIDER_SITE_OTHER): Admitting: Internal Medicine

## 2024-03-31 VITALS — BP 122/80 | HR 64 | Temp 98.1°F | Ht 60.0 in | Wt 159.6 lb

## 2024-03-31 DIAGNOSIS — R7989 Other specified abnormal findings of blood chemistry: Secondary | ICD-10-CM | POA: Insufficient documentation

## 2024-03-31 DIAGNOSIS — Z23 Encounter for immunization: Secondary | ICD-10-CM | POA: Diagnosis not present

## 2024-03-31 DIAGNOSIS — D638 Anemia in other chronic diseases classified elsewhere: Secondary | ICD-10-CM | POA: Insufficient documentation

## 2024-03-31 DIAGNOSIS — D649 Anemia, unspecified: Secondary | ICD-10-CM

## 2024-03-31 DIAGNOSIS — M160 Bilateral primary osteoarthritis of hip: Secondary | ICD-10-CM | POA: Diagnosis not present

## 2024-03-31 DIAGNOSIS — M170 Bilateral primary osteoarthritis of knee: Secondary | ICD-10-CM

## 2024-03-31 DIAGNOSIS — N189 Chronic kidney disease, unspecified: Secondary | ICD-10-CM | POA: Diagnosis not present

## 2024-03-31 DIAGNOSIS — M2550 Pain in unspecified joint: Secondary | ICD-10-CM

## 2024-03-31 NOTE — Progress Notes (Signed)
 I,[name], CMA,acting as a Neurosurgeon for [name], MD.,have documented all relevant documentation on the behalf of [name], MD,as directed by  [name], MD while in the presence of [name], MD.  Subjective:  Patient ID: Martha Pham , female    DOB: 16-May-1951 , 73 y.o.   MRN: [id number]  Chief Complaint  Patient presents with   Medical Advice    She would like to discuss recent lab results taken. Ferritin stood out to her being the level was out of the normal range. She wants to know is this something she should be worried about.  She also would like to follow up with stool card. Since no blood had been found. Could the inflammation be due to anything else. She does currently take iron supplements.     HPI Discussed the use of AI scribe software for clinical note transcription with the patient, who gave verbal consent to proceed.  History of Present Illness Martha Pham is a 73 year old female with chronic kidney disease and anemia who presents for a detailed review of her lab results.  She is concerned about her anemia and the possibility of blood loss, although recent stool tests showed no blood. She has been experiencing elevated ferritin levels. She started taking iron supplements about a week ago after her lab work was done.  She experiences joint pain primarily in her hips and knees, and she has started receiving injections that have helped with groin pain. She also reports morning stiffness in her right hand.  Her social history includes occasional alcohol consumption, about four ounces of wine once a week. Her daughter, who lives in [place] , is concerned about her health and plans to bring her soursop.  She has a family history of lupus and possibly other autoimmune diseases. She is interested in natural remedies to reduce inflammation, such as bone broth and spices like ginger and turmeric.   Past Medical History:  Diagnosis Date    Apnea, sleep    does wear cpap   Arthritis    Cancer (HCC)    lt. breast ca-snbx   Chronic kidney disease    Contact lens/glasses fitting    wears contacts or glasses   COPD (chronic obstructive pulmonary disease) (HCC)    brother   Heart murmur    Hypercholesteremia    Hyperlipemia    Hypertension    Lower back pain    Lung cancer (HCC)    Malaise and fatigue    Personal history of chemotherapy 2013   Sleep apnea    SLEEP STUDY DX 10,CPAP BUT DON'TUSE   Vitamin D  deficiency    Wears dentures    upper     Family History  Problem Relation Age of Onset   Heart attack Mother    Brain cancer Father    Breast cancer Sister 86   Breast cancer Sister 54   Sleep apnea Neg Hx      Current Outpatient Medications:    acetaminophen  (TYLENOL ) 500 MG tablet, Take 1,000 mg by mouth every 6 (six) hours as needed for moderate pain. , Disp: , Rfl:    Ascorbic Acid (VITAMIN C) 1000 MG tablet, Take 1,000 mg by mouth daily., Disp: , Rfl:    atorvastatin  (LIPITOR) 40 MG tablet, Take 1 tablet (40 mg total) by mouth daily., Disp: 90 tablet, Rfl: 2   Blood Glucose Monitoring Suppl (ONETOUCH VERIO) w/Device KIT, USE AS DIRECTED TO CHECK  BLOOD SUGARS, Disp: 1 kit, Rfl: 1   calcium  carbonate (OS-CAL) 600 MG TABS, Take 600 mg by mouth 2 (two) times daily with a meal.  , Disp: , Rfl:    Carboxymethylcellulose Sodium (REFRESH OP), Apply 1 drop to eye 2 (two) times daily as needed. For dry eyes, Disp: , Rfl:    Cyanocobalamin (VITAMIN B-12) 2500 MCG SUBL, Place 1 tablet under the tongue daily., Disp: , Rfl:    gabapentin  (NEURONTIN ) 100 MG capsule, Take 1 pill at bedtime as needed for leg pain as needed, Disp: 90 capsule, Rfl: 1   glucose blood (CONTOUR NEXT TEST) test strip, USE AS DIRECTED TO  CHECK  BLOOD  SUGARS  ONCE  A  DAY, Disp: 100 each, Rfl: 0   glucose blood (ONETOUCH VERIO) test strip, Use as instructed, Disp: 100 each, Rfl: 12   hydrochlorothiazide  (HYDRODIURIL ) 25 MG tablet, Take 1 tablet  (25 mg total) by mouth daily., Disp: 90 tablet, Rfl: 2   loratadine  (CLARITIN ) 10 MG tablet, Take 1 tablet (10 mg total) by mouth daily. prn, Disp: 30 tablet, Rfl: 2   losartan  (COZAAR ) 100 MG tablet, Take 1 tablet by mouth once daily, Disp: 90 tablet, Rfl: 0   Magnesium 400 MG CAPS, Take by mouth daily. , Disp: , Rfl:    metoprolol  succinate (TOPROL -XL) 25 MG 24 hr tablet, Take 1 tablet (25 mg total) by mouth daily., Disp: 90 tablet, Rfl: 2   Microlet Lancets MISC, USE AS DIRECTED TO  CHECK  BLOOD  SUGARS  ONE  TIME  A  DAY, Disp: 100 each, Rfl: 0   OneTouch Delica Lancets 30G MISC, USE ONCE DAILY TO CHECK BLOOD SUGAR., Disp: 100 each, Rfl: 1   Semaglutide ,0.25 or 0.5MG /DOS, (OZEMPIC , 0.25 OR 0.5 MG/DOSE,) 2 MG/3ML SOPN, Inject 0.5 mg into the skin once a week., Disp: 9 mL, Rfl: 1   Vitamin D , Cholecalciferol, 50 MCG (2000 UT) CAPS, Take by mouth. , Disp: , Rfl:    scopolamine  (TRANSDERM SCOP , 1.5 MG,) 1 MG/3DAYS, Place 1 patch (1.5 mg total) onto the skin every 3 (three) days. As needed (Patient not taking: Reported on 03/31/2024), Disp: 4 patch, Rfl: 1   Allergies  Allergen Reactions   Dust Mite Extract Itching   Other     grass     Review of Systems  Constitutional: Negative.   Respiratory: Negative.    Cardiovascular: Negative.   Gastrointestinal: Negative.   Musculoskeletal:  Positive for arthralgias.  Neurological: Negative.   Psychiatric/Behavioral: Negative.       Today's Vitals   03/31/24 0852  BP: 122/80  Pulse: 64  Temp: 98.1 F (36.7 C)  SpO2: 98%  Weight: 159 lb 9.6 oz (72.4 kg)  Height: 5' (1.524 m)   Body mass index is 31.17 kg/m.  Wt Readings from Last 3 Encounters:  03/31/24 159 lb 9.6 oz (72.4 kg)  02/18/24 160 lb 9.6 oz (72.8 kg)  12/03/23 157 lb 1.6 oz (71.3 kg)     Objective:  Physical Exam Vitals and nursing note reviewed.  Constitutional:      Appearance: Normal appearance.  HENT:     Head: Normocephalic and atraumatic.  Eyes:      Extraocular Movements: Extraocular movements intact.  Cardiovascular:     Rate and Rhythm: Normal rate and regular rhythm.     Heart sounds: Normal heart sounds.  Pulmonary:     Effort: Pulmonary effort is normal.     Breath sounds: Normal breath sounds.  Musculoskeletal:     Cervical back: Normal range of motion.  Skin:    General: Skin is warm.  Neurological:     General: No focal deficit present.     Mental Status: She is alert.  Psychiatric:        Mood and Affect: Mood normal.        Behavior: Behavior normal.         Assessment And Plan:  Anemia of chronic disease Assessment & Plan: Anemia likely due to chronic kidney disease and inflammation. Elevated ferritin suggests inflammation. Differential includes anemia of chronic disease. Elevated ferritin levels noted.  Awaiting hematologist input. - Recheck blood count today. - Contact Dr. Godina for lab work recommendations. - Order liver function test and ANA. - Recheck ferritin level in three months. - Advise dietary modifications to reduce inflammation. -  I will continue to follow her levels.   Orders: -     CBC with Differential/Platelet -     Gamma GT  Polyarthralgia Assessment & Plan: Osteoarthritis in hips and knees with leg pain and morning hand stiffness. Possible systemic inflammation contribution. - Continue current osteoarthritis management. - Consider dietary modifications to reduce inflammation.  Orders: -     ANA, IFA (with reflex) -     FANA Staining Patterns  Immunization due -     Flu vaccine HIGH DOSE PF(Fluzone Trivalent)   Return if symptoms worsen or fail to improve.  Patient was given opportunity to ask questions. Patient verbalized understanding of the plan and was able to repeat key elements of the plan. All questions were answered to their satisfaction.   I, [name], MD, have reviewed all documentation for this visit. The documentation on 03/31/24 for the exam, diagnosis,  procedures, and orders are all accurate and complete.   IF YOU HAVE BEEN REFERRED TO A SPECIALIST, IT MAY TAKE 1-2 WEEKS TO SCHEDULE/PROCESS THE REFERRAL. IF YOU HAVE NOT HEARD FROM US /SPECIALIST IN TWO WEEKS, PLEASE GIVE US  A CALL AT [phone].   THE PATIENT IS ENCOURAGED TO PRACTICE SOCIAL DISTANCING DUE TO THE COVID-19 PANDEMIC.

## 2024-04-03 LAB — FANA STAINING PATTERNS: Speckled Pattern: 1:80 {titer}

## 2024-04-03 LAB — CBC WITH DIFFERENTIAL/PLATELET
Basophils Absolute: 0 x10E3/uL (ref 0.0–0.2)
Basos: 1 %
EOS (ABSOLUTE): 0.1 x10E3/uL (ref 0.0–0.4)
Eos: 2 %
Hematocrit: 36.5 % (ref 34.0–46.6)
Hemoglobin: 11.6 g/dL (ref 11.1–15.9)
Immature Grans (Abs): 0 x10E3/uL (ref 0.0–0.1)
Immature Granulocytes: 0 %
Lymphocytes Absolute: 2.4 x10E3/uL (ref 0.7–3.1)
Lymphs: 40 %
MCH: 31.4 pg (ref 26.6–33.0)
MCHC: 31.8 g/dL (ref 31.5–35.7)
MCV: 99 fL — ABNORMAL HIGH (ref 79–97)
Monocytes Absolute: 0.5 x10E3/uL (ref 0.1–0.9)
Monocytes: 8 %
Neutrophils Absolute: 3 x10E3/uL (ref 1.4–7.0)
Neutrophils: 49 %
Platelets: 193 x10E3/uL (ref 150–450)
RBC: 3.69 x10E6/uL — ABNORMAL LOW (ref 3.77–5.28)
RDW: 11.8 % (ref 11.7–15.4)
WBC: 6 x10E3/uL (ref 3.4–10.8)

## 2024-04-03 LAB — GAMMA GT: GGT: 29 IU/L (ref 0–60)

## 2024-04-03 LAB — ANTINUCLEAR ANTIBODIES, IFA: ANA Titer 1: POSITIVE — AB

## 2024-04-04 ENCOUNTER — Ambulatory Visit: Payer: Self-pay | Admitting: Internal Medicine

## 2024-04-04 ENCOUNTER — Encounter: Payer: Self-pay | Admitting: Internal Medicine

## 2024-04-04 DIAGNOSIS — R768 Other specified abnormal immunological findings in serum: Secondary | ICD-10-CM

## 2024-04-04 DIAGNOSIS — M2550 Pain in unspecified joint: Secondary | ICD-10-CM | POA: Insufficient documentation

## 2024-04-04 NOTE — Assessment & Plan Note (Signed)
 Osteoarthritis in hips and knees with leg pain and morning hand stiffness. Possible systemic inflammation contribution. - Continue current osteoarthritis management. - Consider dietary modifications to reduce inflammation.

## 2024-04-04 NOTE — Assessment & Plan Note (Addendum)
 Anemia likely due to chronic kidney disease and inflammation. Elevated ferritin suggests inflammation. Differential includes anemia of chronic disease. Elevated ferritin levels noted.  Awaiting hematologist input. - Recheck blood count today. - Contact Dr. Godina for lab work recommendations. - Order liver function test and ANA. - Recheck ferritin level in three months. - Advise dietary modifications to reduce inflammation. -  I will continue to follow her levels.

## 2024-04-29 ENCOUNTER — Other Ambulatory Visit: Payer: Self-pay | Admitting: Internal Medicine

## 2024-05-23 ENCOUNTER — Ambulatory Visit (INDEPENDENT_AMBULATORY_CARE_PROVIDER_SITE_OTHER): Admitting: Internal Medicine

## 2024-05-23 ENCOUNTER — Encounter: Payer: Self-pay | Admitting: Radiology

## 2024-05-23 ENCOUNTER — Encounter: Payer: Self-pay | Admitting: Internal Medicine

## 2024-05-23 VITALS — BP 122/80 | HR 73 | Temp 98.3°F | Ht 60.0 in | Wt 157.2 lb

## 2024-05-23 DIAGNOSIS — D638 Anemia in other chronic diseases classified elsewhere: Secondary | ICD-10-CM

## 2024-05-23 DIAGNOSIS — E1122 Type 2 diabetes mellitus with diabetic chronic kidney disease: Secondary | ICD-10-CM | POA: Diagnosis not present

## 2024-05-23 DIAGNOSIS — M160 Bilateral primary osteoarthritis of hip: Secondary | ICD-10-CM

## 2024-05-23 DIAGNOSIS — E2839 Other primary ovarian failure: Secondary | ICD-10-CM

## 2024-05-23 DIAGNOSIS — I129 Hypertensive chronic kidney disease with stage 1 through stage 4 chronic kidney disease, or unspecified chronic kidney disease: Secondary | ICD-10-CM

## 2024-05-23 DIAGNOSIS — E7800 Pure hypercholesterolemia, unspecified: Secondary | ICD-10-CM

## 2024-05-23 DIAGNOSIS — R9431 Abnormal electrocardiogram [ECG] [EKG]: Secondary | ICD-10-CM

## 2024-05-23 DIAGNOSIS — N1831 Chronic kidney disease, stage 3a: Secondary | ICD-10-CM | POA: Diagnosis not present

## 2024-05-23 DIAGNOSIS — G4733 Obstructive sleep apnea (adult) (pediatric): Secondary | ICD-10-CM

## 2024-05-23 DIAGNOSIS — Z01818 Encounter for other preprocedural examination: Secondary | ICD-10-CM

## 2024-05-23 DIAGNOSIS — R002 Palpitations: Secondary | ICD-10-CM | POA: Diagnosis not present

## 2024-05-23 NOTE — Progress Notes (Signed)
 I,Martha Pham, CMA,acting as a neurosurgeon for Martha LOISE Slocumb, MD.,have documented all relevant documentation on the behalf of Martha LOISE Slocumb, MD,as directed by  Martha LOISE Slocumb, MD while in the presence of Martha LOISE Slocumb, MD.  Subjective:  Patient ID: Martha Pham , female    DOB: Dec 15, 1950 , 73 y.o.   MRN: [id number]  Chief Complaint  Patient presents with   Diabetes    Patient present for a bp & diabetes check. She reports compliance with meds. She denies having any headaches, chest pain and shortness of breath.   She asks about a referral to complete bone density.  She also wants to know other alternatives for Ozempic  being she will not get it through [facility] next year.   Hypertension    HPI Discussed the use of AI scribe software for clinical note transcription with the patient, who gave verbal consent to proceed.  History of Present Illness Martha Pham is a 73 year old female with diabetes who presents for a diabetes check and preoperative evaluation for hip replacement surgery.  Her diabetes is well-controlled with a recent hemoglobin A1c of 5.8% as of July 31st. She checks her blood sugars, though not regularly, with the last check being about a week ago. She has started exercising more and has been going to the gym.  She is scheduled for right hip replacement surgery on January 21st. She initially received a referral [facility] and injections but has since sought a second opinion from Dr. Alucio at [facility], where she plans to have the surgery. She experiences pain in her right hip, although x-rays indicated that both hips are similarly affected. She chose to address the right hip first due to the pain experienced there.  She has a history of palpitations, which were previously evaluated by a cardiologist in December 2023. She experiences occasional palpitations, particularly at night when lying down, but has no chest pain or shortness of  breath. She has a CPAP machine but has not used it this year.  She confirms that she has stairs at home and does not experience shortness of breath when using them. Her water intake has improved.    Diabetes She presents for her follow-up diabetic visit. She has type 2 diabetes mellitus. Her disease course has been improving. There are no hypoglycemic associated symptoms. Pertinent negatives for diabetes include no chest pain, no polydipsia, no polyphagia and no polyuria. There are no hypoglycemic complications. Risk factors for coronary artery disease include diabetes mellitus, dyslipidemia, hypertension and obesity.  Hypertension This is a chronic problem. The current episode started more than 1 year ago. The problem has been gradually improving since onset. The problem is controlled. Associated symptoms include palpitations. Pertinent negatives include no chest pain or shortness of breath. Risk factors for coronary artery disease include diabetes mellitus, dyslipidemia, post-menopausal state, obesity and sedentary lifestyle. The current treatment provides moderate improvement. Compliance problems include exercise.   Hyperlipidemia This is a chronic problem. The current episode started more than 1 year ago. Exacerbating diseases include diabetes and obesity. Pertinent negatives include no chest pain or shortness of breath. Current antihyperlipidemic treatment includes statins.     Past Medical History:  Diagnosis Date   Apnea, sleep    does wear cpap   Arthritis    Cancer (HCC)    lt. breast ca-snbx   Chronic kidney disease    Contact lens/glasses fitting    wears contacts or glasses  COPD (chronic obstructive pulmonary disease) (HCC)    brother   Heart murmur    Hypercholesteremia    Hyperlipemia    Hypertension    Lower back pain    Lung cancer (HCC)    Malaise and fatigue    Personal history of chemotherapy 2013   Sleep apnea    SLEEP STUDY DX 10,CPAP BUT DON'TUSE   Vitamin  D deficiency    Wears dentures    upper     Family History  Problem Relation Age of Onset   Heart attack Mother    Brain cancer Father    Breast cancer Sister 26   Breast cancer Sister 80   Sleep apnea Neg Hx      Current Outpatient Medications:    acetaminophen  (TYLENOL ) 500 MG tablet, Take 1,000 mg by mouth every 6 (six) hours as needed for moderate pain. , Disp: , Rfl:    Ascorbic Acid (VITAMIN C) 1000 MG tablet, Take 1,000 mg by mouth daily., Disp: , Rfl:    atorvastatin  (LIPITOR) 40 MG tablet, Take 1 tablet (40 mg total) by mouth daily., Disp: 90 tablet, Rfl: 2   Blood Glucose Monitoring Suppl (ONETOUCH VERIO) w/Device KIT, USE AS DIRECTED TO CHECK BLOOD SUGARS, Disp: 1 kit, Rfl: 1   calcium  carbonate (OS-CAL) 600 MG TABS, Take 600 mg by mouth 2 (two) times daily with a meal.  , Disp: , Rfl:    Carboxymethylcellulose Sodium (REFRESH OP), Apply 1 drop to eye 2 (two) times daily as needed. For dry eyes, Disp: , Rfl:    Cyanocobalamin (VITAMIN B-12) 2500 MCG SUBL, Place 1 tablet under the tongue daily., Disp: , Rfl:    gabapentin  (NEURONTIN ) 100 MG capsule, Take 1 pill at bedtime as needed for leg pain as needed, Disp: 90 capsule, Rfl: 1   glucose blood (CONTOUR NEXT TEST) test strip, USE AS DIRECTED TO  CHECK  BLOOD  SUGARS  ONCE  A  DAY, Disp: 100 each, Rfl: 0   glucose blood (ONETOUCH VERIO) test strip, Use as instructed, Disp: 100 each, Rfl: 12   hydrochlorothiazide  (HYDRODIURIL ) 25 MG tablet, Take 1 tablet (25 mg total) by mouth daily., Disp: 90 tablet, Rfl: 2   loratadine  (CLARITIN ) 10 MG tablet, Take 1 tablet (10 mg total) by mouth daily. prn, Disp: 30 tablet, Rfl: 2   losartan  (COZAAR ) 100 MG tablet, Take 1 tablet by mouth once daily, Disp: 90 tablet, Rfl: 0   Magnesium 400 MG CAPS, Take by mouth daily. , Disp: , Rfl:    metoprolol  succinate (TOPROL -XL) 25 MG 24 hr tablet, Take 1 tablet (25 mg total) by mouth daily., Disp: 90 tablet, Rfl: 2   Microlet Lancets MISC, USE AS  DIRECTED TO  CHECK  BLOOD  SUGARS  ONE  TIME  A  DAY, Disp: 100 each, Rfl: 0   OneTouch Delica Lancets 30G MISC, USE ONCE DAILY TO CHECK BLOOD SUGAR., Disp: 100 each, Rfl: 1   Semaglutide ,0.25 or 0.5MG /DOS, (OZEMPIC , 0.25 OR 0.5 MG/DOSE,) 2 MG/3ML SOPN, Inject 0.5 mg into the skin once a week., Disp: 9 mL, Rfl: 1   Vitamin D , Cholecalciferol, 50 MCG (2000 UT) CAPS, Take by mouth. , Disp: , Rfl:    scopolamine  (TRANSDERM SCOP , 1.5 MG,) 1 MG/3DAYS, Place 1 patch (1.5 mg total) onto the skin every 3 (three) days. As needed (Patient not taking: Reported on 05/23/2024), Disp: 4 patch, Rfl: 1   Allergies  Allergen Reactions   Dust Mite Extract Itching  Other     grass     Review of Systems  Constitutional: Negative.  Negative for unexpected weight change.  HENT: Negative.    Respiratory: Negative.  Negative for shortness of breath.   Cardiovascular:  Positive for palpitations. Negative for chest pain.  Gastrointestinal: Negative.   Endocrine: Negative for polydipsia, polyphagia and polyuria.  Neurological: Negative.   Psychiatric/Behavioral: Negative.       Today's Vitals   05/23/24 1608  BP: 122/80  Pulse: 73  Temp: 98.3 F (36.8 C)  SpO2: 98%  Weight: 157 lb 3.2 oz (71.3 kg)  Height: 5' (1.524 m)   Body mass index is 30.7 kg/m.  Wt Readings from Last 3 Encounters:  05/23/24 157 lb 3.2 oz (71.3 kg)  03/31/24 159 lb 9.6 oz (72.4 kg)  02/18/24 160 lb 9.6 oz (72.8 kg)     Objective:  Physical Exam Vitals and nursing note reviewed.  Constitutional:      Appearance: Normal appearance. She is obese.  HENT:     Head: Normocephalic and atraumatic.  Eyes:     Extraocular Movements: Extraocular movements intact.  Cardiovascular:     Rate and Rhythm: Normal rate and regular rhythm.     Heart sounds: Normal heart sounds.  Pulmonary:     Effort: Pulmonary effort is normal.     Breath sounds: Normal breath sounds.  Musculoskeletal:     Cervical back: Normal range of motion.   Skin:    General: Skin is warm.  Neurological:     General: No focal deficit present.     Mental Status: She is alert.  Psychiatric:        Mood and Affect: Mood normal.        Behavior: Behavior normal.         Assessment And Plan:  Type 2 diabetes mellitus with stage 3a chronic kidney disease, without long-term current use of insulin (HCC) Assessment & Plan: Chronic, she will c/w semaglutide  0.5mg  weekly. Importance of dietary/medication compliance was discussed with the patient. She was congratulated on the lifestyle changes she has made thus far.    Orders: -     CMP14+EGFR -     Hemoglobin A1c -     TSH -     Magnesium  Hypertensive nephropathy Assessment & Plan: Chronic, fair control. Goal BP <130/80.  She will continue with hydrochlorothiazide  25mg  daily, losartan  100mg  daily and metoprolol  XL 25mg  daily.  She is encouraged to follow low sodium diet. She will f/u in four months for re-evaluation.   Primary osteoarthritis of both hips Assessment & Plan: Bilateral primary osteoarthritis of the hip, right hip more symptomatic. Decision for right hip replacement based on symptoms.   Pre-operative clearance Assessment & Plan: Preoperative evaluation for right hip replacement scheduled for August 10, 2024. Surgery with Dr. Alucio at [facility]. Imaging suggests left hip may be worse, but right hip is more symptomatic. - Perform EKG and blood work today. - Ensure preoperative clearance is completed before surgery.  Orders: -     EKG 12-Lead -     Ambulatory referral to Cardiology  Palpitations Assessment & Plan: Intermittent palpitations, primarily at night. Last cardiologist visit December 2023. No chest pain or shortness of breath. Occasional rapid heartbeat. - Recommend follow-up with cardiologist before surgery. - Encourage use of CPAP to see if palpitations decrease.  Orders: -     EKG 12-Lead -     TSH -     Magnesium -  Ambulatory referral to  Cardiology  Abnormal EKG -     Ambulatory referral to Cardiology  OSA (obstructive sleep apnea) Assessment & Plan: Obstructive sleep apnea. Has CPAP machine but not using it regularly. - Encourage regular use of CPAP machine     Return if symptoms worsen or fail to improve.  Patient was given opportunity to ask questions. Patient verbalized understanding of the plan and was able to repeat key elements of the plan. All questions were answered to their satisfaction.   I, Martha LOISE Slocumb, MD, have reviewed all documentation for this visit. The documentation on 05/23/24 for the exam, diagnosis, procedures, and orders are all accurate and complete.   IF YOU HAVE BEEN REFERRED TO A SPECIALIST, IT MAY TAKE 1-2 WEEKS TO SCHEDULE/PROCESS THE REFERRAL. IF YOU HAVE NOT HEARD FROM US /SPECIALIST IN TWO WEEKS, PLEASE GIVE US  A CALL AT [phone].   THE PATIENT IS ENCOURAGED TO PRACTICE SOCIAL DISTANCING DUE TO THE COVID-19 PANDEMIC.

## 2024-05-23 NOTE — Patient Instructions (Signed)

## 2024-05-24 LAB — HEMOGLOBIN A1C
Est. average glucose Bld gHb Est-mCnc: 111 mg/dL
Hgb A1c MFr Bld: 5.5 % (ref 4.8–5.6)

## 2024-05-24 LAB — CMP14+EGFR
ALT: 18 IU/L (ref 0–32)
AST: 16 IU/L (ref 0–40)
Albumin: 4.6 g/dL (ref 3.8–4.8)
Alkaline Phosphatase: 74 IU/L (ref 49–135)
BUN/Creatinine Ratio: 25 (ref 12–28)
BUN: 31 mg/dL — ABNORMAL HIGH (ref 8–27)
Bilirubin Total: 0.3 mg/dL (ref 0.0–1.2)
CO2: 23 mmol/L (ref 20–29)
Calcium: 10.3 mg/dL (ref 8.7–10.3)
Chloride: 100 mmol/L (ref 96–106)
Creatinine, Ser: 1.25 mg/dL — ABNORMAL HIGH (ref 0.57–1.00)
Globulin, Total: 3.1 g/dL (ref 1.5–4.5)
Glucose: 108 mg/dL — ABNORMAL HIGH (ref 70–99)
Potassium: 4.2 mmol/L (ref 3.5–5.2)
Sodium: 138 mmol/L (ref 134–144)
Total Protein: 7.7 g/dL (ref 6.0–8.5)
eGFR: 45 mL/min/1.73 — ABNORMAL LOW (ref >59)

## 2024-05-24 LAB — MAGNESIUM: Magnesium: 2.5 mg/dL — ABNORMAL HIGH (ref 1.6–2.3)

## 2024-05-24 LAB — TSH: TSH: 0.921 u[IU]/mL (ref 0.450–4.500)

## 2024-05-26 ENCOUNTER — Ambulatory Visit: Payer: Self-pay | Admitting: Internal Medicine

## 2024-05-29 DIAGNOSIS — Z01818 Encounter for other preprocedural examination: Secondary | ICD-10-CM | POA: Insufficient documentation

## 2024-05-29 DIAGNOSIS — G4733 Obstructive sleep apnea (adult) (pediatric): Secondary | ICD-10-CM | POA: Insufficient documentation

## 2024-05-29 NOTE — Assessment & Plan Note (Signed)
 Chronic, she will c/w semaglutide  0.5mg  weekly. Importance of dietary/medication compliance was discussed with the patient. She was congratulated on the lifestyle changes she has made thus far.

## 2024-05-29 NOTE — Assessment & Plan Note (Signed)
 Chronic, fair control. Goal BP <130/80.  She will continue with hydrochlorothiazide  25mg  daily, losartan  100mg  daily and metoprolol  XL 25mg  daily.  She is encouraged to follow low sodium diet. She will f/u in four months for re-evaluation.

## 2024-05-29 NOTE — Assessment & Plan Note (Signed)
 Intermittent palpitations, primarily at night. Last cardiologist visit December 2023. No chest pain or shortness of breath. Occasional rapid heartbeat. - Recommend follow-up with cardiologist before surgery. - Encourage use of CPAP to see if palpitations decrease.

## 2024-05-29 NOTE — Assessment & Plan Note (Signed)
 Bilateral primary osteoarthritis of the hip, right hip more symptomatic. Decision for right hip replacement based on symptoms.

## 2024-05-29 NOTE — Assessment & Plan Note (Signed)
 Obstructive sleep apnea. Has CPAP machine but not using it regularly. - Encourage regular use of CPAP machine

## 2024-05-29 NOTE — Assessment & Plan Note (Signed)
 Preoperative evaluation for right hip replacement scheduled for August 10, 2024. Surgery with Dr. Alucio at [facility]. Imaging suggests left hip may be worse, but right hip is more symptomatic. - Perform EKG and blood work today. - Ensure preoperative clearance is completed before surgery.

## 2024-05-31 NOTE — Progress Notes (Signed)
 Office Visit Note  Patient: Martha Pham             Date of Birth: 03/27/1951           MRN: [id number]             PCP: Jarold Medici, MD Referring: Jarold Medici, MD Visit Date: 06/03/2024 Occupation: @GUAROCC @  Subjective:  New Patient (Initial Visit) (Joint Pain and abnormal labs)  Discussed the use of AI scribe software for clinical note transcription with the patient, who gave verbal consent to proceed.  History of Present Illness Martha Pham is a 73 year old female with arthritis and diabetes who presents with joint pain and numbness in her hands. She was referred by Dr. Jarold for evaluation of joint pain and a positive ANA test.  She experiences significant joint pain primarily in her hips, which has been persistent and severe enough to warrant a planned hip replacement surgery in January. The pain radiates down her leg and affects her groin area. She has received injections in her hip, which provided temporary relief, but the pain has returned over the past three months. She also uses Salonpas patches and other topical treatments, which have become less effective over time. Additionally, she reports pain in her knees and muscles, which she attributes to her altered gait due to hip pain. She uses a cane occasionally but tries to remain independent, though she has difficulty putting on socks due to her mobility issues.  She experiences numbness in her right hand, particularly in the mornings, which improves with movement. She has not been diagnosed with carpal tunnel syndrome. No numbness while driving and no other significant issues with her hands.  Her family history includes a distant cousin with lupus, but no other known family history of autoimmune diseases such as rheumatoid arthritis, ulcerative colitis, or Crohn's disease. No symptoms commonly associated with lupus, such as rashes, sun sensitivity, or Raynaud's phenomenon. She denies oral ulcers. She does report dry  eyes and mouth, which she attributes to her diabetes.   She has a history of breast cancer, for which she underwent a mastectomy in 2012, and she continues to have regular mammograms. She denies taking any anti-inflammatory medications, using only Tylenol  for pain relief.    Activities of Daily Living:  Patient reports morning stiffness for 24 hours.   Patient Reports nocturnal pain.  Difficulty dressing/grooming: Reports Difficulty climbing stairs: Reports Difficulty getting out of chair: Reports Difficulty using hands for taps, buttons, cutlery, and/or writing: Reports  Review of Systems  Constitutional:  Positive for fatigue.  HENT:  Positive for mouth dryness. Negative for mouth sores.   Eyes:  Positive for dryness.  Respiratory:  Negative for shortness of breath.   Cardiovascular:  Negative for chest pain and palpitations.  Gastrointestinal:  Negative for blood in stool, constipation and diarrhea.  Endocrine: Positive for increased urination.  Genitourinary:  Negative for involuntary urination.  Musculoskeletal:  Positive for joint pain, gait problem, joint pain, myalgias, muscle weakness, morning stiffness, muscle tenderness and myalgias. Negative for joint swelling.  Skin:  Negative for color change, rash, hair loss and sensitivity to sunlight.  Allergic/Immunologic: Negative for susceptible to infections.  Neurological:  Positive for dizziness. Negative for headaches.  Hematological:  Negative for swollen glands.  Psychiatric/Behavioral:  Positive for depressed mood and sleep disturbance. The patient is nervous/anxious.    PMFS History:  Patient Active Problem List   Diagnosis Date Noted   Pre-operative clearance 05/29/2024  OSA (obstructive sleep apnea) 05/29/2024   Polyarthralgia 04/04/2024   Anemia of chronic disease 03/31/2024   Tooth abscess 02/19/2024   Dry skin dermatitis 10/18/2023   Annual physical exam 10/08/2023   Left elbow tendinitis 08/01/2023    Counseling about travel 08/01/2023   Estrogen deficiency 05/28/2023   Essential hypertension 01/26/2023   Primary osteoarthritis of both hips 01/26/2023   RLS (restless legs syndrome) 01/26/2023   Pure hypercholesterolemia 04/06/2022   Palpitations 04/06/2022   Personal history of COVID-19 04/06/2022   Mixed hyperlipidemia 06/06/2021   Class 1 obesity due to excess calories with serious comorbidity and body mass index (BMI) of 31.0 to 31.9 in adult 06/06/2021   Keloid 06/06/2021   History of breast cancer 11/23/2020   Type 2 diabetes mellitus with stage 3a chronic kidney disease, without long-term current use of insulin (HCC) 06/22/2018   Chronic renal disease, stage II 06/22/2018   Hypertensive nephropathy 06/22/2018   Overweight with body mass index (BMI) of 29 to 29.9 in adult 06/22/2018   Pain in joint, pelvic region and thigh 03/28/2014   Acquired absence of breast and absent nipple 05/05/2012    Past Medical History:  Diagnosis Date   Apnea, sleep    does wear cpap   Arthritis    Cancer (HCC)    lt. breast ca-snbx   Chronic kidney disease    Contact lens/glasses fitting    wears contacts or glasses   Heart murmur    Hypercholesteremia    Hyperlipemia    Hypertension    Lower back pain    Lung cancer (HCC)    Malaise and fatigue    Personal history of chemotherapy 2013   Sleep apnea    SLEEP STUDY DX 10,CPAP BUT DON'TUSE   Vitamin D  deficiency    Wears dentures    upper    Family History  Problem Relation Age of Onset   Heart attack Mother    Brain cancer Father    Breast cancer Sister 54   Osteoarthritis Sister    Diabetes Sister    Breast cancer Sister 57   Heart attack Sister    Hypertension Sister    Hyperlipidemia Sister    Hypertension Sister    Osteoarthritis Sister    Hyperlipidemia Sister    Heart disease Paternal Grandfather    Aneurysm Son    Sleep apnea Neg Hx    Past Surgical History:  Procedure Laterality Date   BREAST BIOPSY Left  05/26/2011   malignant   BREAST BIOPSY Right 10/29/2016   BREAST IMPLANT EXCHANGE Left 09/23/2012   Procedure: REMOVAL OF LEFT EXPANDER/PLACEMENT OF IMPLANT WITH REDUCTION OF RIGHT BREAST;  Surgeon: Estefana Reichert, DO;  Location: [facility];  Service: Plastics;  Laterality: Left;   BREAST LUMPECTOMY W/ NEEDLE LOCALIZATION  06/24/2011   left   BREAST REDUCTION SURGERY Right 09/23/2012   Procedure: MAMMARY REDUCTION  (BREAST);  Surgeon: Estefana Reichert, DO;  Location: [facility];  Service: Plastics;  Laterality: Right;   BREAST SURGERY  08/07/2011   re-exc. left breast margins   CATARACT EXTRACTION Bilateral    january and february 2022   CYST REMOVAL NECK  08/2013   Dr. Reichert   LIPOSUCTION Bilateral 09/23/2012   Procedure: LIPOSUCTION;  Surgeon: Estefana Reichert, DO;  Location:  SUR[facility]ERY C[facility];  Service: Plastics;  Laterality: Bilateral;   MASTECTOMY Left 2012   MOUTH SURGERY     TOOTH IMPLANT BOTTOM X2 LFT   PORT-A-CATH REMOVAL Right  09/23/2012   Procedure: REMOVAL PORT-A-CATH;  Surgeon: Estefana Reichert, DO;  Location: [facility];  Service: Plastics;  Laterality: Right;   PORTACATH PLACEMENT  06/24/2011   Procedure: INSERTION PORT-A-CATH;  Surgeon: Redell Alm Faith, DO;  Location: [facility];  Service: General;  Laterality: Right;  Right mediport placement   RECONSTRUCTION BREAST IMMEDIATE / DELAYED W/ TISSUE EXPANDER  04/2012   left   REDUCTION MAMMAPLASTY Right 2013   SIMPLE MASTECTOMY  09/01/2011   left   TONSILLECTOMY     tooth implant removed     10/2021   Social History   Social History Narrative   Daughter [place], [place]   Immunization History  Administered Date(s) Administered   Fluad Quad(high Dose 65+) 05/30/2020, 06/06/2021, 06/25/2022   Fluad Trivalent(High Dose 65+) 05/28/2023   INFLUENZA, HIGH DOSE SEASONAL PF 05/05/2018, 04/14/2019, 03/31/2024   Influenza,inj,Quad PF,6+ Mos 04/04/2014    PFIZER(Purple Top)SARS-COV-2 Vaccination 11/03/2019, 11/28/2019, 07/08/2020   PNEUMOCOCCAL CONJUGATE-20 11/28/2021   Pneumococcal Polysaccharide-23 05/12/2019   Tdap 07/03/2022   Zoster Recombinant(Shingrix ) 10/22/2021, 04/03/2022     Objective: Vital Signs: BP (!) 147/76   Pulse 60   Temp 98.3 F (36.8 C)   Resp 12   Ht 4' 11.75 (1.518 m)   Wt 156 lb 3.2 oz (70.9 kg)   BMI 30.76 kg/m    Physical Exam Vitals and nursing note reviewed.  HENT:     Head: Normocephalic and atraumatic.     Nose: Nose normal.  Eyes:     Conjunctiva/sclera: Conjunctivae normal.     Pupils: Pupils are equal, round, and reactive to light.  Pulmonary:     Effort: Pulmonary effort is normal. No respiratory distress.  Skin:    General: Skin is warm and dry.  Neurological:     Mental Status: She is alert. Mental status is at baseline.  Psychiatric:        Mood and Affect: Mood normal.        Behavior: Behavior normal.      Musculoskeletal Exam:   CDAI Exam: CDAI Score: -- Patient Global: --; Provider Global: -- Swollen: 0 ; Tender: 0  Joint Exam 06/03/2024   All documented joints were normal     Investigation: No additional findings.  Imaging: No results found.  Recent Labs: Lab Results  Component Value Date   WBC 6.0 03/31/2024   HGB 11.6 03/31/2024   PLT 193 03/31/2024   NA 138 05/23/2024   K 4.2 05/23/2024   CL 100 05/23/2024   CO2 23 05/23/2024   GLUCOSE 108 (H) 05/23/2024   BUN 31 (H) 05/23/2024   CREATININE 1.25 (H) 05/23/2024   BILITOT 0.3 05/23/2024   ALKPHOS 74 05/23/2024   AST 16 05/23/2024   ALT 18 05/23/2024   PROT 7.7 05/23/2024   ALBUMIN 4.6 05/23/2024   CALCIUM  10.3 05/23/2024   GFRAA 65 05/30/2020   Lab Results  Component Value Date   RF 10 06/03/2024    Speciality Comments: No specialty comments available.  Procedures:  No procedures performed Allergies: Dust mite extract and Other   Assessment / Plan:     Visit Diagnoses: Polyarthralgia -  Plan: Vitamin B12, Protein / creatinine ratio, urine, Sjogren's syndrome antibods(ssa + ssb), Angiotensin converting enzyme, Rheumatoid Factor, Cyclic citrul peptide antibody, IgG, Kappa/lambda light chains, IFE Interpretation  Macrocytic anemia - Plan: Vitamin B12, Protein / creatinine ratio, urine, Sjogren's syndrome antibods(ssa + ssb), Angiotensin converting enzyme, Rheumatoid Factor, Cyclic citrul peptide antibody, IgG, Kappa/lambda light  chains, IFE Interpretation  Positive ANA (antinuclear antibody) - Plan: Vitamin B12, Protein / creatinine ratio, urine, Sjogren's syndrome antibods(ssa + ssb), Angiotensin converting enzyme, Rheumatoid Factor, Cyclic citrul peptide antibody, IgG, Kappa/lambda light chains, IFE Interpretation  Other fatigue - Plan: Vitamin B12, Protein / creatinine ratio, urine, Sjogren's syndrome antibods(ssa + ssb), Angiotensin converting enzyme, Rheumatoid Factor, Cyclic citrul peptide antibody, IgG, Kappa/lambda light chains, IFE Interpretation  Neuropathy - Plan: Vitamin B12, Protein / creatinine ratio, urine, Sjogren's syndrome antibods(ssa + ssb), Angiotensin converting enzyme, Rheumatoid Factor, Cyclic citrul peptide antibody, IgG, Kappa/lambda light chains, IFE Interpretation  Assessment and Plan Assessment & Plan Positive ANA Patient with mildly positive ANA (1:80) and right hip pain. Patient does not have any features of SLE (no objective malar rash, inflammatory arthritis, Raynaud's, alopecia, recurrent cytopenias), rheumatoid arthritis (no evidence of synovitis, negative RF/CCP), scleroderma (no sclerodactyly, no Raynaud's). She does admit to dry eyes, so will obtain SSA and SSB today. However, given her presentation very low suspicion at this time.  Discussed with patient that a positive ANA need not represent presence of clinically active systemic autoimmune disease.  Can be positive in the normal population, autoimmune thyroid  disease (Graves' disease, Hashimoto's  thyroiditis etc.), or infection. ANA can be positive in the healthy population at the following rates: ANA 1:40: 20%-30%, ANA 1:80: 10%-15%, ANA 1:160: 5%, ANA 1:320: 3% positive, healthy relative of an SLE patient: 5%-25% positive (usually low titers), and elderly (age >70 years): up to 70% positive at ANA titer 1:40.   Bilateral hip osteoarthritis with severe right hip pain Patient with known hx of bilateral OA, which is the likely source of her chronic hip pain. She is scheduled for right hip replacement surgery in January. Advised continuation with conservative management at this time. Advised patient follow-up with orthopedics if symptoms worsen.  Carpel tunnel syndrome; right Numbness in the right hand, particularly in the morning, suggestive of carpal tunnel syndrome. She has no joint pain, joint swelling, or AM stiffness suggestive of an inflammatory etiology. Recommended use of a carpal tunnel brace at night. Will also check B12 given macrocytic anemia below.  Macrocytic Anemia Patient with macrocytic anemia w/ neuropathy and fatigue. Possible B12 deficiency contributing to numbness and fatigue. B12 ordered today.  Elevated ferritin Abnormal SPEP Patient with elevated ferritin suggestive of anemia of chronic disease, and SPEP with abnormalities suggestive of inflammatory process. Extremely low suspicion that etiology is rheumatologic inflammatory disease at this time given lack of systemic symptoms. Will obtain RF and CCP to r/o RA however extremely low suspicion at this time. Will also obtain K/L ratio and IFE given abnormal SPEP. Can consider referral to hematology/oncology for further evaluation of alternative etiology of inflammatory process pending results.    Orders: Orders Placed This Encounter  Procedures   Vitamin B12   Protein / creatinine ratio, urine   Sjogren's syndrome antibods(ssa + ssb)   Angiotensin converting enzyme   Rheumatoid Factor   Cyclic citrul peptide  antibody, IgG   Kappa/lambda light chains   IFE Interpretation   No orders of the defined types were placed in this encounter.   I personally spent a total of 45 minutes in the care of the patient today including preparing to see the patient, getting/reviewing separately obtained history, performing a medically appropriate exam/evaluation, counseling and educating, placing orders, and documenting clinical information in the EHR.  Follow-Up Instructions: Return in about 4 weeks (around 07/01/2024).   Asberry Claw, DO

## 2024-06-01 ENCOUNTER — Other Ambulatory Visit: Payer: Self-pay | Admitting: Internal Medicine

## 2024-06-03 ENCOUNTER — Ambulatory Visit

## 2024-06-03 VITALS — BP 147/76 | HR 60 | Temp 98.3°F | Resp 12 | Ht 59.75 in | Wt 156.2 lb

## 2024-06-03 DIAGNOSIS — D539 Nutritional anemia, unspecified: Secondary | ICD-10-CM

## 2024-06-03 DIAGNOSIS — G629 Polyneuropathy, unspecified: Secondary | ICD-10-CM

## 2024-06-03 DIAGNOSIS — R5383 Other fatigue: Secondary | ICD-10-CM

## 2024-06-03 DIAGNOSIS — M2550 Pain in unspecified joint: Secondary | ICD-10-CM | POA: Diagnosis not present

## 2024-06-03 DIAGNOSIS — R7689 Other specified abnormal immunological findings in serum: Secondary | ICD-10-CM

## 2024-06-03 NOTE — Patient Instructions (Addendum)
 Carpel Tunnel Wrist Brace for Right Wrist/Carpel Tunnel Syndrome Tennis Elbow/Elbow Strap for Left Tendonitis

## 2024-06-06 ENCOUNTER — Ambulatory Visit: Payer: Self-pay | Admitting: Internal Medicine

## 2024-06-08 LAB — PROTEIN / CREATININE RATIO, URINE
Creatinine, Urine: 168 mg/dL (ref 20–275)
Protein/Creat Ratio: 48 mg/g{creat} (ref 24–184)
Protein/Creatinine Ratio: 0.048 mg/mg{creat} (ref 0.024–0.184)
Total Protein, Urine: 8 mg/dL (ref 5–24)

## 2024-06-08 LAB — KAPPA/LAMBDA LIGHT CHAINS
Kappa free light chain: 29.6 mg/L — ABNORMAL HIGH (ref 3.3–19.4)
Kappa:Lambda Ratio: 1.31 (ref 0.26–1.65)
Lambda Free Lght Chn: 22.6 mg/L (ref 5.7–26.3)

## 2024-06-08 LAB — VITAMIN B12: Vitamin B-12: 1135 pg/mL — ABNORMAL HIGH (ref 200–1100)

## 2024-06-08 LAB — ANGIOTENSIN CONVERTING ENZYME: Angiotensin-Converting Enzyme: 17 U/L (ref 9–67)

## 2024-06-08 LAB — SJOGREN'S SYNDROME ANTIBODS(SSA + SSB)
SSA (Ro) (ENA) Antibody, IgG: <1 AI
SSB (La) (ENA) Antibody, IgG: <1 AI

## 2024-06-08 LAB — CYCLIC CITRUL PEPTIDE ANTIBODY, IGG: Cyclic Citrullin Peptide Ab: <16 U

## 2024-06-08 LAB — IFE INTERPRETATION

## 2024-06-08 LAB — RHEUMATOID FACTOR: Rheumatoid fact SerPl-aCnc: 10 [IU]/mL (ref <14)

## 2024-06-09 ENCOUNTER — Encounter

## 2024-06-10 ENCOUNTER — Ambulatory Visit: Payer: Self-pay

## 2024-06-29 NOTE — Progress Notes (Deleted)
 Office Visit Note  Patient: Martha Pham             Date of Birth: 03-Jan-1951           MRN: [id number]             PCP: Jarold Medici, MD Referring: Jarold Medici, MD Visit Date: 07/04/2024 Occupation: Data Unavailable  Subjective:  No chief complaint on file.   History of Present Illness: Martha Pham is a 73 y.o. female ***     Activities of Daily Living:  Patient reports morning stiffness for *** {minute/hour:19697}.   Patient {ACTIONS;DENIES/REPORTS:21021675::Denies} nocturnal pain.  Difficulty dressing/grooming: {ACTIONS;DENIES/REPORTS:21021675::Denies} Difficulty climbing stairs: {ACTIONS;DENIES/REPORTS:21021675::Denies} Difficulty getting out of chair: {ACTIONS;DENIES/REPORTS:21021675::Denies} Difficulty using hands for taps, buttons, cutlery, and/or writing: {ACTIONS;DENIES/REPORTS:21021675::Denies}  No Rheumatology ROS completed.   PMFS History:  Patient Active Problem List   Diagnosis Date Noted   Pre-operative clearance 05/29/2024   OSA (obstructive sleep apnea) 05/29/2024   Polyarthralgia 04/04/2024   Anemia of chronic disease 03/31/2024   Tooth abscess 02/19/2024   Dry skin dermatitis 10/18/2023   Annual physical exam 10/08/2023   Left elbow tendinitis 08/01/2023   Counseling about travel 08/01/2023   Estrogen deficiency 05/28/2023   Essential hypertension 01/26/2023   Primary osteoarthritis of both hips 01/26/2023   RLS (restless legs syndrome) 01/26/2023   Pure hypercholesterolemia 04/06/2022   Palpitations 04/06/2022   Personal history of COVID-19 04/06/2022   Mixed hyperlipidemia 06/06/2021   Class 1 obesity due to excess calories with serious comorbidity and body mass index (BMI) of 31.0 to 31.9 in adult 06/06/2021   Keloid 06/06/2021   History of breast cancer 11/23/2020   Type 2 diabetes mellitus with stage 3a chronic kidney disease, without long-term current use of insulin (HCC) 06/22/2018   Chronic renal disease, stage II  06/22/2018   Hypertensive nephropathy 06/22/2018   Overweight with body mass index (BMI) of 29 to 29.9 in adult 06/22/2018   Pain in joint, pelvic region and thigh 03/28/2014   Acquired absence of breast and absent nipple 05/05/2012    Past Medical History:  Diagnosis Date   Apnea, sleep    does wear cpap   Arthritis    Cancer (HCC)    lt. breast ca-snbx   Chronic kidney disease    Contact lens/glasses fitting    wears contacts or glasses   Heart murmur    Hypercholesteremia    Hyperlipemia    Hypertension    Lower back pain    Lung cancer (HCC)    Malaise and fatigue    Personal history of chemotherapy 2013   Sleep apnea    SLEEP STUDY DX 10,CPAP BUT DON'TUSE   Vitamin D  deficiency    Wears dentures    upper    Family History  Problem Relation Age of Onset   Heart attack Mother    Brain cancer Father    Breast cancer Sister 3   Osteoarthritis Sister    Diabetes Sister    Breast cancer Sister 43   Heart attack Sister    Hypertension Sister    Hyperlipidemia Sister    Hypertension Sister    Osteoarthritis Sister    Hyperlipidemia Sister    Heart disease Paternal Grandfather    Aneurysm Son    Sleep apnea Neg Hx    Past Surgical History:  Procedure Laterality Date   BREAST BIOPSY Left 05/26/2011   malignant   BREAST BIOPSY Right 10/29/2016   BREAST IMPLANT EXCHANGE Left 09/23/2012  Procedure: REMOVAL OF LEFT EXPANDER/PLACEMENT OF IMPLANT WITH REDUCTION OF RIGHT BREAST;  Surgeon: Estefana Reichert, DO;  Location: [facility];  Service: Plastics;  Laterality: Left;   BREAST LUMPECTOMY W/ NEEDLE LOCALIZATION  06/24/2011   left   BREAST REDUCTION SURGERY Right 09/23/2012   Procedure: MAMMARY REDUCTION  (BREAST);  Surgeon: Estefana Reichert, DO;  Location: [facility];  Service: Plastics;  Laterality: Right;   BREAST SURGERY  08/07/2011   re-exc. left breast margins   CATARACT EXTRACTION Bilateral    january and february 2022   CYST  REMOVAL NECK  08/2013   Dr. Reichert   LIPOSUCTION Bilateral 09/23/2012   Procedure: LIPOSUCTION;  Surgeon: Estefana Reichert, DO;  Location: [facility];  Service: Plastics;  Laterality: Bilateral;   MASTECTOMY Left 2012   MOUTH SURGERY     TOOTH IMPLANT BOTTOM X2 LFT   PORT-A-CATH REMOVAL Right 09/23/2012   Procedure: REMOVAL PORT-A-CATH;  Surgeon: Estefana Reichert, DO;  Location: [facility];  Service: Plastics;  Laterality: Right;   PORTACATH PLACEMENT  06/24/2011   Procedure: INSERTION PORT-A-CATH;  Surgeon: Redell Alm Faith, DO;  Location: [facility];  Service: General;  Laterality: Right;  Right mediport placement   RECONSTRUCTION BREAST IMMEDIATE / DELAYED W/ TISSUE EXPANDER  04/2012   left   REDUCTION MAMMAPLASTY Right 2013   SIMPLE MASTECTOMY  09/01/2011   left   TONSILLECTOMY     tooth implant removed     10/2021   Social History   Tobacco Use   Smoking status: Never    Passive exposure: Past   Smokeless tobacco: Never  Vaping Use   Vaping status: Never Used  Substance Use Topics   Alcohol use: Not Currently   Drug use: No   Social History   Social History Narrative   Daughter [place], [place]     Immunization History  Administered Date(s) Administered   Fluad Quad(high Dose 65+) 05/30/2020, 06/06/2021, 06/25/2022   Fluad Trivalent(High Dose 65+) 05/28/2023   INFLUENZA, HIGH DOSE SEASONAL PF 05/05/2018, 04/14/2019, 03/31/2024   Influenza,inj,Quad PF,6+ Mos 04/04/2014   PFIZER(Purple Top)SARS-COV-2 Vaccination 11/03/2019, 11/28/2019, 07/08/2020   PNEUMOCOCCAL CONJUGATE-20 11/28/2021   Pneumococcal Polysaccharide-23 05/12/2019   Tdap 07/03/2022   Zoster Recombinant(Shingrix ) 10/22/2021, 04/03/2022     Objective: Vital Signs: There were no vitals taken for this visit.   Physical Exam   Musculoskeletal Exam: ***  CDAI Exam: CDAI Score: -- Patient Global: --; Provider Global: -- Swollen: --; Tender: -- Joint Exam  07/04/2024   No joint exam has been documented for this visit   There is currently no information documented on the homunculus. Go to the Rheumatology activity and complete the homunculus joint exam.  Investigation: No additional findings.  Imaging: No results found.  Recent Labs: Lab Results  Component Value Date   WBC 6.0 03/31/2024   HGB 11.6 03/31/2024   PLT 193 03/31/2024   NA 138 05/23/2024   K 4.2 05/23/2024   CL 100 05/23/2024   CO2 23 05/23/2024   GLUCOSE 108 (H) 05/23/2024   BUN 31 (H) 05/23/2024   CREATININE 1.25 (H) 05/23/2024   BILITOT 0.3 05/23/2024   ALKPHOS 74 05/23/2024   AST 16 05/23/2024   ALT 18 05/23/2024   PROT 7.7 05/23/2024   ALBUMIN 4.6 05/23/2024   CALCIUM  10.3 05/23/2024   GFRAA 65 05/30/2020    Speciality Comments: No specialty comments available.  Procedures:  No procedures performed Allergies: Dust mite extract and Other  Assessment / Plan:     Visit Diagnoses: No diagnosis found.  Orders: No orders of the defined types were placed in this encounter.  No orders of the defined types were placed in this encounter.   Face-to-face time spent with patient was *** minutes. Greater than 50% of time was spent in counseling and coordination of care.  Follow-Up Instructions: No follow-ups on file.   Alfonso Patterson, LPN  Note - This record has been created using [employer].  Chart creation errors have been sought, but may not always  have been located. Such creation errors do not reflect on  the standard of medical care.

## 2024-07-01 ENCOUNTER — Ambulatory Visit: Payer: Self-pay

## 2024-07-01 DIAGNOSIS — N1831 Chronic kidney disease, stage 3a: Secondary | ICD-10-CM

## 2024-07-01 DIAGNOSIS — Z0000 Encounter for general adult medical examination without abnormal findings: Secondary | ICD-10-CM

## 2024-07-01 DIAGNOSIS — M160 Bilateral primary osteoarthritis of hip: Secondary | ICD-10-CM

## 2024-07-01 DIAGNOSIS — I129 Hypertensive chronic kidney disease with stage 1 through stage 4 chronic kidney disease, or unspecified chronic kidney disease: Secondary | ICD-10-CM

## 2024-07-01 DIAGNOSIS — E1122 Type 2 diabetes mellitus with diabetic chronic kidney disease: Secondary | ICD-10-CM | POA: Diagnosis not present

## 2024-07-01 NOTE — Patient Instructions (Signed)

## 2024-07-01 NOTE — Progress Notes (Signed)
 Chief Complaint  Patient presents with   Annual Exam     Subjective:   Martha Pham is a 73 y.o. female who presents for a Medicare Annual Wellness Visit.  Visit info / Clinical Intake: Medicare Wellness Visit Type:: Subsequent Annual Wellness Visit Persons participating in visit and providing information:: patient Medicare Wellness Visit Mode:: Telephone If telephone:: video error Since this visit was completed virtually, some vitals may be partially provided or unavailable. Missing vitals are due to the limitations of the virtual format.: Unable to obtain vitals - no equipment If Telephone or Video please confirm:: I connected with patient using audio/video enable telemedicine. I verified patient identity with two identifiers, discussed telehealth limitations, and patient agreed to proceed. Patient Location:: home Provider Location:: office/ clinic Interpreter Needed?: No Pre-visit prep was completed: no AWV questionnaire completed by patient prior to visit?: no Living arrangements:: (!) lives alone Patient's Overall Health Status Rating: (!) fair Typical amount of pain: some Does pain affect daily life?: (!) yes Are you currently prescribed opioids?: no  Dietary Habits and Nutritional Risks How many meals a day?: 2 Eats fruit and vegetables daily?: yes Most meals are obtained by: preparing own meals In the last 2 weeks, have you had any of the following?: none Diabetic:: (!) yes Any non-healing wounds?: no How often do you check your BS?: 1 Would you like to be referred to a Nutritionist or for Diabetic Management? : no  Functional Status Activities of Daily Living (to include ambulation/medication): Independent Ambulation: Independent Medication Administration: Independent Home Management (perform basic housework or laundry): Independent Manage your own finances?: yes Primary transportation is: driving Concerns about vision?: no *vision screening is required for  WTM* Concerns about hearing?: no  Fall Screening Falls in the past year?: 0 Number of falls in past year: 0 Was there an injury with Fall?: 0 Fall Risk Category Calculator: 0 Patient Fall Risk Level: Low Fall Risk  Fall Risk Patient at Risk for Falls Due to: No Fall Risks Fall risk Follow up: Falls evaluation completed  Home and Transportation Safety: All rugs have non-skid backing?: yes All stairs or steps have railings?: yes Grab bars in the bathtub or shower?: yes Have non-skid surface in bathtub or shower?: yes Good home lighting?: yes Regular seat belt use?: yes Hospital stays in the last year:: no  Cognitive Assessment Difficulty concentrating, remembering, or making decisions? : yes Will 6CIT or Mini Cog be Completed: yes What year is it?: 0 points What month is it?: 0 points Give patient an address phrase to remember (5 components): [address] [place] About what time is it?: 0 points Count backwards from 20 to 1: 0 points Say the months of the year in reverse: 0 points Repeat the address phrase from earlier: 0 points 6 CIT Score: 0 points  Advance Directives (For Healthcare) Does Patient Have a Medical Advance Directive?: Yes Does patient want to make changes to medical advance directive?: No - Patient declined  Reviewed/Updated  Reviewed/Updated: Reviewed All (Medical, Surgical, Family, Medications, Allergies, Care Teams, Patient Goals)    Allergies (verified) Dust mite extract and Other   Current Medications (verified) Outpatient Encounter Medications as of 07/01/2024  Medication Sig   acetaminophen  (TYLENOL ) 500 MG tablet Take 1,000 mg by mouth every 6 (six) hours as needed for moderate pain.    Ascorbic Acid (VITAMIN C) 1000 MG tablet Take 1,000 mg by mouth daily.   atorvastatin  (LIPITOR) 40 MG tablet Take 1 tablet by mouth  once daily   Blood Glucose Monitoring Suppl (ONETOUCH VERIO) w/Device KIT USE AS DIRECTED TO CHECK BLOOD SUGARS   calcium   carbonate (OS-CAL) 600 MG TABS Take 600 mg by mouth 2 (two) times daily with a meal.     Cyanocobalamin (VITAMIN B-12) 2500 MCG SUBL Place 1 tablet under the tongue daily.   gabapentin  (NEURONTIN ) 100 MG capsule Take 1 pill at bedtime as needed for leg pain as needed   glucose blood (CONTOUR NEXT TEST) test strip USE AS DIRECTED TO  CHECK  BLOOD  SUGARS  ONCE  A  DAY   glucose blood (ONETOUCH VERIO) test strip Use as instructed   hydrochlorothiazide  (HYDRODIURIL ) 25 MG tablet Take 1 tablet (25 mg total) by mouth daily.   loratadine  (CLARITIN ) 10 MG tablet Take 1 tablet (10 mg total) by mouth daily. prn   losartan  (COZAAR ) 100 MG tablet Take 1 tablet by mouth once daily   Magnesium 400 MG CAPS Take by mouth daily.    metoprolol  succinate (TOPROL -XL) 25 MG 24 hr tablet Take 1 tablet by mouth once daily   Microlet Lancets MISC USE AS DIRECTED TO  CHECK  BLOOD  SUGARS  ONE  TIME  A  DAY   OneTouch Delica Lancets 30G MISC USE ONCE DAILY TO CHECK BLOOD SUGAR.   Semaglutide ,0.25 or 0.5MG /DOS, (OZEMPIC , 0.25 OR 0.5 MG/DOSE,) 2 MG/3ML SOPN Inject 0.5 mg into the skin once a week.   Vitamin D , Cholecalciferol, 50 MCG (2000 UT) CAPS Take by mouth.    Carboxymethylcellulose Sodium (REFRESH OP) Apply 1 drop to eye 2 (two) times daily as needed. For dry eyes (Patient not taking: Reported on 07/01/2024)   scopolamine  (TRANSDERM SCOP , 1.5 MG,) 1 MG/3DAYS Place 1 patch (1.5 mg total) onto the skin every 3 (three) days. As needed (Patient not taking: Reported on 07/01/2024)   No facility-administered encounter medications on file as of 07/01/2024.    History: Past Medical History:  Diagnosis Date   Apnea, sleep    does wear cpap   Arthritis    Cancer (HCC)    lt. breast ca-snbx   Chronic kidney disease    Contact lens/glasses fitting    wears contacts or glasses   Heart murmur    Hypercholesteremia    Hyperlipemia    Hypertension    Lower back pain    Lung cancer (HCC)    Malaise and fatigue     Personal history of chemotherapy 2013   Sleep apnea    SLEEP STUDY DX 10,CPAP BUT DON'TUSE   Vitamin D  deficiency    Wears dentures    upper   Past Surgical History:  Procedure Laterality Date   BREAST BIOPSY Left 05/26/2011   malignant   BREAST BIOPSY Right 10/29/2016   BREAST IMPLANT EXCHANGE Left 09/23/2012   Procedure: REMOVAL OF LEFT EXPANDER/PLACEMENT OF IMPLANT WITH REDUCTION OF RIGHT BREAST;  Surgeon: Estefana Reichert, DO;  Location: [facility];  Service: Plastics;  Laterality: Left;   BREAST LUMPECTOMY W/ NEEDLE LOCALIZATION  06/24/2011   left   BREAST REDUCTION SURGERY Right 09/23/2012   Procedure: MAMMARY REDUCTION  (BREAST);  Surgeon: Estefana Reichert, DO;  Location: [facility];  Service: Plastics;  Laterality: Right;   BREAST SURGERY  08/07/2011   re-exc. left breast margins   CATARACT EXTRACTION Bilateral    january and february 2022   CYST REMOVAL NECK  08/2013   Dr. Reichert   LIPOSUCTION Bilateral 09/23/2012   Procedure: LIPOSUCTION;  Surgeon:  Claire Sanger, DO;  Location: [facility];  Service: Plastics;  Laterality: Bilateral;   MASTECTOMY Left 2012   MOUTH SURGERY     TOOTH IMPLANT BOTTOM X2 LFT   PORT-A-CATH REMOVAL Right 09/23/2012   Procedure: REMOVAL PORT-A-CATH;  Surgeon: Estefana Reichert, DO;  Location: [facility];  Service: Plastics;  Laterality: Right;   PORTACATH PLACEMENT  06/24/2011   Procedure: INSERTION PORT-A-CATH;  Surgeon: Redell Alm Faith, DO;  Location: [facility];  Service: General;  Laterality: Right;  Right mediport placement   RECONSTRUCTION BREAST IMMEDIATE / DELAYED W/ TISSUE EXPANDER  04/2012   left   REDUCTION MAMMAPLASTY Right 2013   SIMPLE MASTECTOMY  09/01/2011   left   TONSILLECTOMY     tooth implant removed     10/2021   Family History  Problem Relation Age of Onset   Heart attack Mother    Brain cancer Father    Breast cancer Sister 71   Osteoarthritis  Sister    Diabetes Sister    Breast cancer Sister 53   Heart attack Sister    Hypertension Sister    Hyperlipidemia Sister    Hypertension Sister    Osteoarthritis Sister    Hyperlipidemia Sister    Heart disease Paternal Grandfather    Aneurysm Son    Sleep apnea Neg Hx    Social History   Occupational History   Occupation: Retired  Tobacco Use   Smoking status: Never    Passive exposure: Past   Smokeless tobacco: Never  Vaping Use   Vaping status: Never Used  Substance and Sexual Activity   Alcohol use: Not Currently   Drug use: No   Sexual activity: Not Currently    Partners: Male    Birth control/protection: Post-menopausal   Tobacco Counseling Counseling given: Yes  SDOH Screenings   Food Insecurity: No Food Insecurity (07/03/2023)  Housing: Unknown (07/03/2023)  Transportation Needs: No Transportation Needs (07/03/2023)  Utilities: Not At Risk (07/03/2023)  Alcohol Screen: Low Risk (03/31/2023)  Depression (PHQ2-9): Low Risk (05/23/2024)  Financial Resource Strain: Low Risk (07/03/2023)  Physical Activity: Insufficiently Active (07/03/2023)  Social Connections: Moderately Isolated (07/03/2023)  Stress: No Stress Concern Present (07/03/2023)  Tobacco Use: Low Risk (07/01/2024)  Health Literacy: Adequate Health Literacy (07/03/2023)   See flowsheets for full screening details  Depression Screen PHQ 2 & 9 Depression Scale- Over the past 2 weeks, how often have you been bothered by any of the following problems? Little interest or pleasure in doing things: 0 Feeling down, depressed, or hopeless (PHQ Adolescent also includes...irritable): 0 PHQ-2 Total Score: 0 Trouble falling or staying asleep, or sleeping too much: 0 Feeling tired or having little energy: 0 Poor appetite or overeating (PHQ Adolescent also includes...weight loss): 0 Feeling bad about yourself - or that you are a failure or have let yourself or your family down: 0 Trouble concentrating on  things, such as reading the newspaper or watching television (PHQ Adolescent also includes...like school work): 0 Moving or speaking so slowly that other people could have noticed. Or the opposite - being so fidgety or restless that you have been moving around a lot more than usual: 0 Thoughts that you would be better off dead, or of hurting yourself in some way: 0 PHQ-9 Total Score: 0 If you checked off any problems, how difficult have these problems made it for you to do your work, take care of things at home, or get along with other people?: Not  difficult at all     Goals Addressed   None          Objective:    Today's Vitals   There is no height or weight on file to calculate BMI.  Hearing/Vision screen No results found. Immunizations and Health Maintenance Health Maintenance  Topic Date Due   COVID-19 Vaccine (4 - 2025-26 season) 03/21/2024   OPHTHALMOLOGY EXAM  10/04/2024   Diabetic kidney evaluation - Urine ACR  10/07/2024   FOOT EXAM  10/07/2024   HEMOGLOBIN A1C  11/20/2024   Colonoscopy  12/25/2024   Diabetic kidney evaluation - eGFR measurement  05/23/2025   Medicare Annual Wellness (AWV)  07/01/2025   DTaP/Tdap/Td (2 - Td or Tdap) 07/03/2032   Pneumococcal Vaccine: 50+ Years  Completed   Influenza Vaccine  Completed   Bone Density Scan  Completed   Hepatitis C Screening  Completed   Zoster Vaccines- Shingrix   Completed   Meningococcal B Vaccine  Aged Out   Mammogram  Discontinued        Assessment/Plan:  This is a routine wellness examination for [place].  Patient Care Team: Jarold Medici, MD as PCP - General (Internal Medicine) Crawford, Morna Pickle, NP as Nurse Practitioner (Hematology and Oncology) Buck Saucer, MD as Attending Physician (Neurology)  I have personally reviewed and noted the following in the patients chart:   Medical and social history Use of alcohol, tobacco or illicit drugs  Current medications and supplements including  opioid prescriptions. Functional ability and status Nutritional status Physical activity Advanced directives List of other physicians Hospitalizations, surgeries, and ER visits in previous 12 months Vitals Screenings to include cognitive, depression, and falls Referrals and appointments  No orders of the defined types were placed in this encounter.  In addition, I have reviewed and discussed with patient certain preventive protocols, quality metrics, and best practice recommendations. A written personalized care plan for preventive services as well as general preventive health recommendations were provided to patient.   Richerd ONEIDA Lemmings, CMA   07/01/2024   Return in 1 year (on 07/01/2025).  After Visit Summary: (MyChart) Due to this being a telephonic visit, the after visit summary with patients personalized plan was offered to patient via MyChart   Nurse Notes: Richerd Lemmings, CMA

## 2024-07-04 ENCOUNTER — Ambulatory Visit

## 2024-07-15 ENCOUNTER — Other Ambulatory Visit: Payer: Self-pay

## 2024-07-15 ENCOUNTER — Ambulatory Visit
Admission: EM | Admit: 2024-07-15 | Discharge: 2024-07-15 | Disposition: A | Discharge status: Home / Self Care | Attending: Physician Assistant | Admitting: Physician Assistant

## 2024-07-15 DIAGNOSIS — K047 Periapical abscess without sinus: Secondary | ICD-10-CM

## 2024-07-15 MED ORDER — OXYCODONE-ACETAMINOPHEN 5-325 MG PO TABS: 1.0000 | ORAL_TABLET | Freq: Four times a day (QID) | ORAL | 0 refills | Status: DC | PRN

## 2024-07-15 MED ORDER — AMOXICILLIN-POT CLAVULANATE 875-125 MG PO TABS: 1.0000 | ORAL_TABLET | Freq: Two times a day (BID) | ORAL | 0 refills | Status: DC

## 2024-07-15 NOTE — ED Triage Notes (Signed)
 Pt presents with a chief complaint of left-sided facial swelling. Symptom onset was two days ago. Rates pain an 8/10. OTC Tylenol  taken + Orajel applied to gums, no improvement. Also has been gargling with salt water.

## 2024-07-15 NOTE — ED Provider Notes (Signed)
 " [facility]    CSN: [id number] Arrival date & time: 07/15/24  1454      History   Chief Complaint Chief Complaint  Patient presents with   Facial Swelling    HPI Martha Pham is a 73 y.o. female.   HPI  Pt is here today with concerns for left sided jaw swelling that started 07/12/24 and has progressively worsened She denies difficulty breathing, swallowing, tongue swelling She reports she is having pain along the left jaw which radiates down the neck and into her cheek She denies bad taste in the mouth but states that she does have some pain in her lower gum  She reports that she did have a dental extraction completed in the lower left side of her mouth about 3 months ago and is due to pick up a partial from her dentist next week.  She has been taking Tylenol  for pain and administering orajel and has used salt water gargles but this is not providing much relief.    Past Medical History:  Diagnosis Date   Apnea, sleep    does wear cpap   Arthritis    Cancer (HCC)    lt. breast ca-snbx   Chronic kidney disease    Contact lens/glasses fitting    wears contacts or glasses   Heart murmur    Hypercholesteremia    Hyperlipemia    Hypertension    Lower back pain    Lung cancer (HCC)    Malaise and fatigue    Personal history of chemotherapy 2013   Sleep apnea    SLEEP STUDY DX 10,CPAP BUT DON'TUSE   Vitamin D  deficiency    Wears dentures    upper    Patient Active Problem List   Diagnosis Date Noted   Pre-operative clearance 05/29/2024   OSA (obstructive sleep apnea) 05/29/2024   Polyarthralgia 04/04/2024   Anemia of chronic disease 03/31/2024   Tooth abscess 02/19/2024   Dry skin dermatitis 10/18/2023   Annual physical exam 10/08/2023   Left elbow tendinitis 08/01/2023   Counseling about travel 08/01/2023   Estrogen deficiency 05/28/2023   Essential hypertension 01/26/2023   Primary osteoarthritis of both hips 01/26/2023   RLS (restless  legs syndrome) 01/26/2023   Pure hypercholesterolemia 04/06/2022   Palpitations 04/06/2022   Personal history of COVID-19 04/06/2022   Mixed hyperlipidemia 06/06/2021   Class 1 obesity due to excess calories with serious comorbidity and body mass index (BMI) of 31.0 to 31.9 in adult 06/06/2021   Keloid 06/06/2021   History of breast cancer 11/23/2020   Type 2 diabetes mellitus with stage 3a chronic kidney disease, without long-term current use of insulin (HCC) 06/22/2018   Chronic renal disease, stage II 06/22/2018   Hypertensive nephropathy 06/22/2018   Overweight with body mass index (BMI) of 29 to 29.9 in adult 06/22/2018   Pain in joint, pelvic region and thigh 03/28/2014   Acquired absence of breast and absent nipple 05/05/2012    Past Surgical History:  Procedure Laterality Date   BREAST BIOPSY Left 05/26/2011   malignant   BREAST BIOPSY Right 10/29/2016   BREAST IMPLANT EXCHANGE Left 09/23/2012   Procedure: REMOVAL OF LEFT EXPANDER/PLACEMENT OF IMPLANT WITH REDUCTION OF RIGHT BREAST;  Surgeon: Estefana Reichert, DO;  Location: [facility];  Service: Plastics;  Laterality: Left;   BREAST LUMPECTOMY W/ NEEDLE LOCALIZATION  06/24/2011   left   BREAST REDUCTION SURGERY Right 09/23/2012   Procedure: MAMMARY REDUCTION  (BREAST);  Surgeon:  Claire Sanger, DO;  Location: [facility];  Service: Plastics;  Laterality: Right;   BREAST SURGERY  08/07/2011   re-exc. left breast margins   CATARACT EXTRACTION Bilateral    january and february 2022   CYST REMOVAL NECK  08/2013   Dr. Deborah   LIPOSUCTION Bilateral 09/23/2012   Procedure: LIPOSUCTION;  Surgeon: Estefana Deborah, DO;  Location: [facility];  Service: Plastics;  Laterality: Bilateral;   MASTECTOMY Left 2012   MOUTH SURGERY     TOOTH IMPLANT BOTTOM X2 LFT   PORT-A-CATH REMOVAL Right 09/23/2012   Procedure: REMOVAL PORT-A-CATH;  Surgeon: Estefana Deborah, DO;  Location: [facility];  Service: Plastics;  Laterality: Right;   PORTACATH PLACEMENT  06/24/2011   Procedure: INSERTION PORT-A-CATH;  Surgeon: Redell Alm Faith, DO;  Location:  SURGERY CENTER;  Service: General;  Laterality: Right;  Right mediport placement   RECONSTRUCTION BREAST IMMEDIATE / DELAYED W/ TISSUE EXPANDER  04/2012   left   REDUCTION MAMMAPLASTY Right 2013   SIMPLE MASTECTOMY  09/01/2011   left   TONSILLECTOMY     tooth implant removed     10/2021    OB History   No obstetric history on file.      Home Medications    Prior to Admission medications  Medication Sig Start Date End Date Taking? Authorizing Provider  amoxicillin -clavulanate (AUGMENTIN ) 875-125 MG tablet Take 1 tablet by mouth every 12 (twelve) hours. 07/15/24  Yes Jaidan Stachnik E, PA-C  oxyCODONE -acetaminophen  (PERCOCET/ROXICET) 5-325 MG tablet Take 1 tablet by mouth every 6 (six) hours as needed for severe pain (pain score 7-10). 07/15/24  Yes Nashid Pellum E, PA-C  acetaminophen  (TYLENOL ) 500 MG tablet Take 1,000 mg by mouth every 6 (six) hours as needed for moderate pain.     [provider]  Ascorbic Acid (VITAMIN C) 1000 MG tablet Take 1,000 mg by mouth daily.    [provider]  atorvastatin  (LIPITOR) 40 MG tablet Take 1 tablet by mouth once daily 06/01/24   Jarold Medici, MD  Blood Glucose Monitoring Suppl (ONETOUCH VERIO) w/Device KIT USE AS DIRECTED TO CHECK BLOOD SUGARS 09/09/22   Jarold Medici, MD  calcium  carbonate (OS-CAL) 600 MG TABS Take 600 mg by mouth 2 (two) times daily with a meal.      [provider]  Carboxymethylcellulose Sodium (REFRESH OP) Apply 1 drop to eye 2 (two) times daily as needed. For dry eyes Patient not taking: Reported on 07/01/2024    [provider]  Cyanocobalamin (VITAMIN B-12) 2500 MCG SUBL Place 1 tablet under the tongue daily.    [provider]  gabapentin  (NEURONTIN ) 100 MG capsule Take 1 pill at bedtime as needed for leg pain  as needed 05/28/23   Jarold Medici, MD  glucose blood (CONTOUR NEXT TEST) test strip USE AS DIRECTED TO  CHECK  BLOOD  SUGARS  ONCE  A  DAY 09/08/22   Jarold Medici, MD  glucose blood [facility] VERIO) test strip Use as instructed 09/09/22   Jarold Medici, MD  hydrochlorothiazide  (HYDRODIURIL ) 25 MG tablet Take 1 tablet (25 mg total) by mouth daily. 07/29/23   Jarold Medici, MD  loratadine  (CLARITIN ) 10 MG tablet Take 1 tablet (10 mg total) by mouth daily. prn 05/28/23   Jarold Medici, MD  losartan  (COZAAR ) 100 MG tablet Take 1 tablet by mouth once daily 04/29/24   Jarold Medici, MD  Magnesium 400 MG CAPS Take by mouth daily.  [provider]  metoprolol  succinate (TOPROL -XL) 25 MG 24 hr tablet Take 1 tablet by mouth once daily 06/01/24   Jarold Medici, MD  Microlet Lancets MISC USE AS DIRECTED TO  CHECK  BLOOD  SUGARS  ONE  TIME  A  DAY 09/08/22   Jarold Medici, MD  OneTouch Delica Lancets 30G MISC USE ONCE DAILY TO CHECK BLOOD SUGAR. 05/28/23   Jarold Medici, MD  scopolamine  (TRANSDERM SCOP , 1.5 MG,) 1 MG/3DAYS Place 1 patch (1.5 mg total) onto the skin every 3 (three) days. As needed Patient not taking: Reported on 07/01/2024 07/29/23   Jarold Medici, MD  Semaglutide ,0.25 or 0.5MG /DOS, (OZEMPIC , 0.25 OR 0.5 MG/DOSE,) 2 MG/3ML SOPN Inject 0.5 mg into the skin once a week. 12/31/21   Jarold Medici, MD  Vitamin D , Cholecalciferol, 50 MCG (2000 UT) CAPS Take by mouth.     [provider]    Family History Family History  Problem Relation Age of Onset   Heart attack Mother    Brain cancer Father    Breast cancer Sister 40   Osteoarthritis Sister    Diabetes Sister    Breast cancer Sister 67   Heart attack Sister    Hypertension Sister    Hyperlipidemia Sister    Hypertension Sister    Osteoarthritis Sister    Hyperlipidemia Sister    Heart disease Paternal Grandfather    Aneurysm Son    Sleep apnea Neg Hx     Social History Social History[1]   Allergies    Dust mite extract and Other   Review of Systems Review of Systems  Constitutional:  Negative for chills and fever.  HENT:  Positive for facial swelling.      Physical Exam Triage Vital Signs ED Triage Vitals  Encounter Vitals Group     BP 07/15/24 1459 131/72     Girls Systolic BP Percentile --      Girls Diastolic BP Percentile --      Boys Systolic BP Percentile --      Boys Diastolic BP Percentile --      Pulse Rate 07/15/24 1459 76     Resp 07/15/24 1459 17     Temp 07/15/24 1459 98.6 F (37 C)     Temp Source 07/15/24 1459 Oral     SpO2 07/15/24 1459 98 %     Weight 07/15/24 1459 152 lb (68.9 kg)     Height 07/15/24 1459 5' (1.524 m)     Head Circumference --      Peak Flow --      Pain Score 07/15/24 1509 8     Pain Loc --      Pain Education --      Exclude from Growth Chart --    No data found.  Updated Vital Signs BP 131/72 (BP Location: Right Arm)   Pulse 76   Temp 98.6 F (37 C) (Oral)   Resp 17   Ht 5' (1.524 m)   Wt 152 lb (68.9 kg)   SpO2 98%   BMI 29.69 kg/m   Visual Acuity Right Eye Distance:   Left Eye Distance:   Bilateral Distance:    Right Eye Near:   Left Eye Near:    Bilateral Near:     Physical Exam Vitals reviewed.  Constitutional:      General: She is awake.     Appearance: Normal appearance. She is well-developed and well-groomed.  HENT:     Head: Normocephalic and atraumatic.  Mouth/Throat:     Lips: Pink.     Mouth: Mucous membranes are moist. No oral lesions or angioedema.     Dentition: Abnormal dentition. Dental tenderness and gingival swelling present. No gum lesions.     Tongue: No lesions. Tongue does not deviate from midline.     Pharynx: Oropharynx is clear. Uvula midline. No pharyngeal swelling, oropharyngeal exudate, posterior oropharyngeal erythema, uvula swelling or postnasal drip.     Tonsils: No tonsillar exudate or tonsillar abscesses. 0 on the right. 0 on the left.   Eyes:     General: Lids are  normal. Gaze aligned appropriately.     Extraocular Movements: Extraocular movements intact.     Conjunctiva/sclera: Conjunctivae normal.  Pulmonary:     Effort: Pulmonary effort is normal.  Lymphadenopathy:     Head:     Right side of head: No submental, submandibular, preauricular or posterior auricular adenopathy.     Left side of head: No submental, submandibular, preauricular or posterior auricular adenopathy.  Neurological:     Mental Status: She is alert and oriented to person, place, and time.  Psychiatric:        Attention and Perception: Attention and perception normal.        Mood and Affect: Mood and affect normal.        Speech: Speech normal.        Behavior: Behavior normal. Behavior is cooperative.        Thought Content: Thought content normal.        Judgment: Judgment normal.      UC Treatments / Results  Labs (all labs ordered are listed, but only abnormal results are displayed) Labs Reviewed - No data to display  EKG   Radiology No results found.  Procedures Procedures (including critical care time)  Medications Ordered in UC Medications - No data to display  Initial Impression / Assessment and Plan / UC Course  I have reviewed the triage vital signs and the nursing notes.  Pertinent labs & imaging results that were available during my care of the patient were reviewed by me and considered in my medical decision making (see chart for details).      Final Clinical Impressions(s) / UC Diagnoses   Final diagnoses:  Dental infection   Patient presents today with concerns for swelling in the left lower area of the mouth/jaw that is been ongoing and worsening over the last 3 days.  She reports that she has tried Tylenol  and Orajel to assist with her symptoms but this has not provided adequate relief.  Patient was seen by her dentist about 3 months ago and had an extraction in the affected area but states that she did not have any immediate problems.   Remaining teeth do not appear to have evidence of dental caries or disease.  Physical exam reveals significant swelling along the left lower gumline with extension into the oral mucosa.  Will start patient on Augmentin  p.o. twice daily x 7 days.  To assist with pain and discomfort will send limited course of oxycodone -acetaminophen  5-325 mg and reviewed with patient that this can cause drowsiness and sedation so she should be careful and mindful when taking.  I recommend the patient keeps upcoming appointment with her dentist and schedules a follow-up appointment with her PCP to make sure that she is recovering appropriately.  ED and return precautions were reviewed with patient and her daughter during appointment and were also provided in AVS.  Recommend close follow-up  with PCP and low threshold for emergency room evaluation. Pt and her daughter voice agreement and understanding with recommendations.    Discharge Instructions      You were seen today for concerns of swelling on the left lower side of your mouth and jaw.  At this time I suspect you may have a dental infection or a dental abscess.  I am sending in an antibiotic called Augmentin  for you to take by mouth twice per day for 7 days.  Please make sure that you finish the entire course of the antibiotic unless you develop an allergic reaction or if a medical provider tells you to stop.  To assist with your pain I am sending in a prescription for oxycodone -acetaminophen .  Please be advised that this medication can cause drowsiness and sedation so do not take it if you need to remain alert or drive and please do not mix it with medications that also cause drowsiness or sedation. Please keep your upcoming appointment with your dentist and I recommend following up with your primary care provider in about 3 to 4 days to make sure that you are getting better. If you develop any of the following symptoms please go to the emergency room: More severe  swelling, more severe pain, fevers not responding to Tylenol , drooling, difficulty swallowing, feeling like her tongue is swollen, difficulty breathing.     ED Prescriptions     Medication Sig Dispense Auth. Provider   amoxicillin -clavulanate (AUGMENTIN ) 875-125 MG tablet Take 1 tablet by mouth every 12 (twelve) hours. 14 tablet Ludger Bones E, PA-C   oxyCODONE -acetaminophen  (PERCOCET/ROXICET) 5-325 MG tablet Take 1 tablet by mouth every 6 (six) hours as needed for severe pain (pain score 7-10). 6 tablet Yoni Lobos E, PA-C      I have reviewed the PDMP during this encounter.     [1]  Social History Tobacco Use   Smoking status: Never    Passive exposure: Past   Smokeless tobacco: Never  Vaping Use   Vaping status: Never Used  Substance Use Topics   Alcohol use: Not Currently   Drug use: No     Jhordan Kinter, Rocky BRAVO, PA-C 07/15/24 1637  "

## 2024-07-15 NOTE — Discharge Instructions (Addendum)
 You were seen today for concerns of swelling on the left lower side of your mouth and jaw.  At this time I suspect you may have a dental infection or a dental abscess.  I am sending in an antibiotic called Augmentin  for you to take by mouth twice per day for 7 days.  Please make sure that you finish the entire course of the antibiotic unless you develop an allergic reaction or if a medical provider tells you to stop.  To assist with your pain I am sending in a prescription for oxycodone -acetaminophen .  Please be advised that this medication can cause drowsiness and sedation so do not take it if you need to remain alert or drive and please do not mix it with medications that also cause drowsiness or sedation. Please keep your upcoming appointment with your dentist and I recommend following up with your primary care provider in about 3 to 4 days to make sure that you are getting better. If you develop any of the following symptoms please go to the emergency room: More severe swelling, more severe pain, fevers not responding to Tylenol , drooling, difficulty swallowing, feeling like her tongue is swollen, difficulty breathing.

## 2024-07-18 ENCOUNTER — Ambulatory Visit: Payer: Self-pay | Admitting: *Deleted

## 2024-07-18 NOTE — Telephone Encounter (Signed)
 FYI Only or Action Required?: FYI only for provider: appointment scheduled on 07/26/24.  Patient was last seen in primary care on 05/23/2024 by Jarold Medici, MD.  Called Nurse Triage reporting Facial Swelling.  Symptoms began several days ago. 07/13/24  Interventions attempted: Prescription medications: Augmentin  .  Symptoms are: gradually improving.  Triage Disposition: See PCP When Office is Open (Within 3 Days)  Patient/caregiver understands and will follow disposition?: Yes   Patient requesting f/u with PCP regarding facial / gum swelling. See NT encounter. Please advise if earlier appt available with PCP.                Copied from CRM #[id number]. Topic: Clinical - Red Word Triage >> Jul 18, 2024 12:39 PM Martha Pham wrote: Red Word that prompted transfer to Nurse Triage: Swelling in gums on left side of jaw. Still has the swelling and pain  Went to Urgent care on 07/15/24. Was told to follow up with PCP Reason for Disposition  [1] MILD face swelling (e.g., puffiness) AND [2] persists > 3 days  Answer Assessment - Initial Assessment Questions Patient requesting appt with PCP. Scheduled earliest 07/26/24. Patient seen at [facility] and prescribed antibiotics. Pt has been seen by dentist as well. Recommended to call back or go back to UC if sx worsen      1. ONSET: When did the swelling start? (e.g., minutes, hours, days)     07/13/24 2. LOCATION: What part of the face is swollen? (e.g., cheek, entire face, jaw joint area, under jaw)     Left side jaw area 3. SEVERITY: How swollen is it?     Small amount now  4. ITCHING: Is there any itching? If Yes, ask: How much?   (Scale 1-10; mild, moderate or severe)     na 5. PAIN: Is the swelling painful to touch? If Yes, ask: How painful is it?   (Scale 0-10; mild, moderate or severe)     Mild pain now  6. FEVER: Do you have a fever? If Yes, ask: What is it, how was it measured, and when did it start?       na 7. CAUSE: What do you think is causing the face swelling?     Not sure  8. NEW MEDICINES: Have there been any new medicines started recently?     Augmentin , percocet  9. RECURRENT SYMPTOM: Have you had face swelling before? If Yes, ask: When was the last time? What happened that time?     No  10. OTHER SYMPTOMS: Do you have any other symptoms? (e.g., leg swelling, toothache)       Left lower gum swelling , jaw swelling 11. PREGNANCY: Is there any chance you are pregnant? When was your last menstrual period?       na  Protocols used: Face Swelling-A-AH

## 2024-07-20 ENCOUNTER — Ambulatory Visit: Payer: Medicare Other

## 2024-07-26 ENCOUNTER — Encounter: Payer: Self-pay | Admitting: Internal Medicine

## 2024-07-26 ENCOUNTER — Ambulatory Visit: Payer: Self-pay | Admitting: Internal Medicine

## 2024-07-26 VITALS — BP 122/80 | HR 67 | Temp 98.0°F | Ht 60.0 in | Wt 152.4 lb

## 2024-07-26 DIAGNOSIS — R002 Palpitations: Secondary | ICD-10-CM

## 2024-07-26 DIAGNOSIS — Z862 Personal history of diseases of the blood and blood-forming organs and certain disorders involving the immune mechanism: Secondary | ICD-10-CM | POA: Diagnosis not present

## 2024-07-26 DIAGNOSIS — M160 Bilateral primary osteoarthritis of hip: Secondary | ICD-10-CM | POA: Diagnosis not present

## 2024-07-26 DIAGNOSIS — R220 Localized swelling, mass and lump, head: Secondary | ICD-10-CM

## 2024-07-26 NOTE — Progress Notes (Unsigned)
 Iron I,Victoria T Hamilton, CMA,acting as a neurosurgeon for Martha LOISE Slocumb, MD.,have documented all relevant documentation on the behalf of Martha LOISE Slocumb, MD,as directed by  Martha LOISE Slocumb, MD while in the presence of Martha LOISE Slocumb, MD.  Subjective:  Patient ID: Martha Pham , female    DOB: Nov 20, 1950 , 74 y.o.   MRN: [id number]  Chief Complaint  Patient presents with   Facial Swelling    Patient presents today for left jaw facial swelling on/off. She noticed on the day before christmas left side jaw swelling with pain. For this issue she went to urgent care on 12/26 she was given amoxicillin  & oxycodone .  She currently is still taking antibiotic , 3 more days left. She also had dentist visit on the following Monday. Xray completed. Dentist thinks she may have a dead nerve. She also has had a tooth extracted from left side.    HPI Discussed the use of AI scribe software for clinical note transcription with the patient, who gave verbal consent to proceed.  History of Present Illness Martha Pham is a 74 year old female who presents with facial pain and swelling.  She has been experiencing significant facial swelling and pain since December 26th. Initially, she sought care at an urgent care facility where she was prescribed antibiotics and oxycodone  for pain management. No x-rays were taken at that time. She later visited her dentist, who identified a dead nerve in one of her teeth and recommended a root canal. However, she postponed the procedure due to cost considerations and the upcoming activation of her insurance. She continues to take antibiotics, with three doses remaining, and reports that the swelling has significantly decreased since the initial onset.  Approximately two to three months ago, she had a tooth extraction and is currently being fitted for a partial denture. She has a history of dental implants, one of which was removed about two years ago. She was evaluated by her oral  surgeon four to five months ago for concerns about food accumulation in the implant socket.  Initially, the pain was rated as a ten out of ten but has since subsided, and she is no longer experiencing pain. She switched from oxycodone  to Tylenol  for pain management due to driving concerns. Her lower jaw and upper lip were notably swollen, affecting her ability to smile and show her teeth.  In November, she experienced palpitations and was advised to see a cardiologist, but she has not yet done so. No current palpitations, chest pain, or shortness of breath. She is scheduled for hip surgery on January 21st and is concerned about potential infection affecting her surgery clearance.   HPI   Past Medical History:  Diagnosis Date   Apnea, sleep    does wear cpap   Arthritis    Cancer (HCC)    lt. breast ca-snbx   Chronic kidney disease    Contact lens/glasses fitting    wears contacts or glasses   Heart murmur    Hypercholesteremia    Hyperlipemia    Hypertension    Lower back pain    Lung cancer (HCC)    Malaise and fatigue    Personal history of chemotherapy 2013   Sleep apnea    SLEEP STUDY DX 10,CPAP BUT DON'TUSE   Vitamin D  deficiency    Wears dentures    upper     Family History  Problem Relation Age of Onset   Heart attack Mother  Brain cancer Father    Breast cancer Sister 65   Osteoarthritis Sister    Diabetes Sister    Breast cancer Sister 101   Heart attack Sister    Hypertension Sister    Hyperlipidemia Sister    Hypertension Sister    Osteoarthritis Sister    Hyperlipidemia Sister    Heart disease Paternal Grandfather    Aneurysm Son    Sleep apnea Neg Hx     Current Medications[1]   Allergies[2]   Review of Systems  Constitutional: Negative.   Respiratory: Negative.    Cardiovascular: Negative.   Neurological: Negative.   Psychiatric/Behavioral: Negative.       Today's Vitals   07/26/24 1556  BP: 122/80  Pulse: 67  Temp: 98 F (36.7 C)   SpO2: 98%  Weight: 152 lb 6.4 oz (69.1 kg)  Height: 5' (1.524 m)   Body mass index is 29.76 kg/m.  Wt Readings from Last 3 Encounters:  07/26/24 152 lb 6.4 oz (69.1 kg)  07/15/24 152 lb (68.9 kg)  06/03/24 156 lb 3.2 oz (70.9 kg)    The 10-year ASCVD risk score (Arnett DK, et al., 2019) is: 26.2%   Values used to calculate the score:     Age: 3 years     Clinically relevant sex: Female     Is Non-Hispanic African American: Yes     Diabetic: Yes     Tobacco smoker: No     Systolic Blood Pressure: 122 mmHg     Is BP treated: Yes     HDL Cholesterol: 64 mg/dL     Total Cholesterol: 152 mg/dL  Objective:  Physical Exam Vitals and nursing note reviewed.  Constitutional:      Appearance: Normal appearance.  HENT:     Head: Normocephalic and atraumatic.  Eyes:     Extraocular Movements: Extraocular movements intact.  Cardiovascular:     Rate and Rhythm: Normal rate and regular rhythm.     Heart sounds: Normal heart sounds.  Pulmonary:     Effort: Pulmonary effort is normal.     Breath sounds: Normal breath sounds.  Musculoskeletal:     Cervical back: Normal range of motion.  Skin:    General: Skin is warm.  Neurological:     General: No focal deficit present.     Mental Status: She is alert.  Psychiatric:        Mood and Affect: Mood normal.        Behavior: Behavior normal.         Assessment And Plan:   Assessment & Plan Jaw swelling  Primary osteoarthritis of both hips  History of anemia   Assessment & Plan Facial swelling and pain likely secondary to dental infection Swelling and pain resolved. Dental evaluation indicated need for root canal. Antibiotics effective. Cleared for hip surgery. - Continue antibiotics for three more days. - Ensure completion of antibiotics. - Proceed with hip surgery on January 21st, 2026.  General Health Maintenance Bone density test pending. Cardiologist referral made, no follow-up yet. No current cardiac symptoms.  EKG normal. Autoimmune causes ruled out. - Ensure bone density test completion. - Arrange virtual cardiologist visit for surgical clearance. - Maintain hydration and healthy lifestyle.  No orders of the defined types were placed in this encounter.  Return if symptoms worsen or fail to improve.  Patient was given opportunity to ask questions. Patient verbalized understanding of the plan and was able to repeat key elements of the plan. All  questions were answered to their satisfaction.    I, Martha LOISE Slocumb, MD, have reviewed all documentation for this visit. The documentation on 07/26/2024 for the exam, diagnosis, procedures, and orders are all accurate and complete.   IF YOU HAVE BEEN REFERRED TO A SPECIALIST, IT MAY TAKE 1-2 WEEKS TO SCHEDULE/PROCESS THE REFERRAL. IF YOU HAVE NOT HEARD FROM US /SPECIALIST IN TWO WEEKS, PLEASE GIVE US  A CALL AT [phone].      [1]  Current Outpatient Medications:    acetaminophen  (TYLENOL ) 500 MG tablet, Take 1,000 mg by mouth every 6 (six) hours as needed for moderate pain. , Disp: , Rfl:    amoxicillin -clavulanate (AUGMENTIN ) 875-125 MG tablet, Take 1 tablet by mouth every 12 (twelve) hours., Disp: 14 tablet, Rfl: 0   Ascorbic Acid (VITAMIN C) 1000 MG tablet, Take 1,000 mg by mouth daily., Disp: , Rfl:    atorvastatin  (LIPITOR) 40 MG tablet, Take 1 tablet by mouth once daily, Disp: 90 tablet, Rfl: 0   Blood Glucose Monitoring Suppl (ONETOUCH VERIO) w/Device KIT, USE AS DIRECTED TO CHECK BLOOD SUGARS, Disp: 1 kit, Rfl: 1   calcium  carbonate (OS-CAL) 600 MG TABS, Take 600 mg by mouth 2 (two) times daily with a meal.  , Disp: , Rfl:    Cyanocobalamin (VITAMIN B-12) 2500 MCG SUBL, Place 1 tablet under the tongue daily., Disp: , Rfl:    gabapentin  (NEURONTIN ) 100 MG capsule, Take 1 pill at bedtime as needed for leg pain as needed, Disp: 90 capsule, Rfl: 1   glucose blood (CONTOUR NEXT TEST) test strip, USE AS DIRECTED TO  CHECK  BLOOD  SUGARS  ONCE  A   DAY, Disp: 100 each, Rfl: 0   glucose blood (ONETOUCH VERIO) test strip, Use as instructed, Disp: 100 each, Rfl: 12   hydrochlorothiazide  (HYDRODIURIL ) 25 MG tablet, Take 1 tablet (25 mg total) by mouth daily., Disp: 90 tablet, Rfl: 2   loratadine  (CLARITIN ) 10 MG tablet, Take 1 tablet (10 mg total) by mouth daily. prn, Disp: 30 tablet, Rfl: 2   losartan  (COZAAR ) 100 MG tablet, Take 1 tablet by mouth once daily, Disp: 90 tablet, Rfl: 0   Magnesium 400 MG CAPS, Take by mouth daily. , Disp: , Rfl:    metoprolol  succinate (TOPROL -XL) 25 MG 24 hr tablet, Take 1 tablet by mouth once daily, Disp: 90 tablet, Rfl: 0   Microlet Lancets MISC, USE AS DIRECTED TO  CHECK  BLOOD  SUGARS  ONE  TIME  A  DAY, Disp: 100 each, Rfl: 0   OneTouch Delica Lancets 30G MISC, USE ONCE DAILY TO CHECK BLOOD SUGAR., Disp: 100 each, Rfl: 1   oxyCODONE -acetaminophen  (PERCOCET/ROXICET) 5-325 MG tablet, Take 1 tablet by mouth every 6 (six) hours as needed for severe pain (pain score 7-10)., Disp: 6 tablet, Rfl: 0   Semaglutide ,0.25 or 0.5MG /DOS, (OZEMPIC , 0.25 OR 0.5 MG/DOSE,) 2 MG/3ML SOPN, Inject 0.5 mg into the skin once a week., Disp: 9 mL, Rfl: 1   Vitamin D , Cholecalciferol, 50 MCG (2000 UT) CAPS, Take by mouth. , Disp: , Rfl:    Carboxymethylcellulose Sodium (REFRESH OP), Apply 1 drop to eye 2 (two) times daily as needed. For dry eyes (Patient not taking: Reported on 07/26/2024), Disp: , Rfl:    scopolamine  (TRANSDERM SCOP , 1.5 MG,) 1 MG/3DAYS, Place 1 patch (1.5 mg total) onto the skin every 3 (three) days. As needed (Patient not taking: Reported on 07/26/2024), Disp: 4 patch, Rfl: 1 [2]  Allergies Allergen Reactions  Dust Mite Extract Itching   Other     grass

## 2024-07-27 ENCOUNTER — Ambulatory Visit: Payer: Self-pay | Admitting: Internal Medicine

## 2024-07-27 LAB — IRON,TIBC AND FERRITIN PANEL
Ferritin: 555 ng/mL — ABNORMAL HIGH (ref 15–150)
Iron Saturation: 19 % (ref 15–55)
Iron: 60 ug/dL (ref 27–139)
Total Iron Binding Capacity: 315 ug/dL (ref 250–450)
UIBC: 255 ug/dL (ref 118–369)

## 2024-07-27 NOTE — Assessment & Plan Note (Signed)
 Pt was previously referred to Cardiology, she has yet to be scheduled.  - Referral coordinator advised to f/u with Cardiology re: appt - Pt states her sx have since resolved.

## 2024-07-27 NOTE — Assessment & Plan Note (Signed)
 Bilateral primary osteoarthritis of the hip, right hip more symptomatic.  - Plans to move forward with THR.

## 2024-07-29 NOTE — Progress Notes (Unsigned)
" °  Cardiology Office Note:  .   Date:  07/29/2024  ID:  Martha Pham, DOB 1950-09-27, MRN [id number] PCP: Jarold Medici, MD  [facility] Providers Cardiologist:  Newman JINNY Lawrence, MD {  History of Present Illness: .   Martha Pham is a 74 y.o. female with history of hypertension, hyperlipidemia, OSA on CPAP, breast cancer     Palpitations 05/2022 no significant arrhythmias.  1 episode of AT for 5 beats.  Ectopy less than 1%.  Echocardiogram normal.  Social history      Patient with no significant prior cardiac history.  We had seen in 2023 for complaints of palpitations with normal echocardiogram and heart monitor results.  She was recommended to follow-up as needed.  Palpitations -05/2022 normal heart monitor/echocardiogram  ROS: Denies: Chest pain, shortness of breath, orthopnea, peripheral edema, palpitations, syncope, decreased exercise capacity, fatigue, dizziness.   Studies Reviewed: .         Risk Assessment/Calculations:   {Does this patient have ATRIAL FIBRILLATION?[phone]} No BP recorded.  {Refresh Note OR Click here to enter BP  :1}***       Physical Exam:   VS:  There were no vitals taken for this visit.   Wt Readings from Last 3 Encounters:  07/26/24 152 lb 6.4 oz (69.1 kg)  07/15/24 152 lb (68.9 kg)  06/03/24 156 lb 3.2 oz (70.9 kg)    GEN: Well nourished, well developed in no acute distress NECK: No JVD; No carotid bruits CARDIAC: ***RRR, no murmurs, rubs, gallops RESPIRATORY:  Clear to auscultation without rales, wheezing or rhonchi  ABDOMEN: Soft, non-tender, non-distended EXTREMITIES:  No edema; No deformity   ASSESSMENT AND PLAN: .         {Are you ordering a CV Procedure (e.g. stress test, cath, DCCV, TEE, etc)?   Press F2        :[id number]}  Dispo: ***  Signed, Thom LITTIE Sluder, PA-C  "

## 2024-07-30 ENCOUNTER — Other Ambulatory Visit: Payer: Self-pay | Admitting: Internal Medicine

## 2024-08-02 ENCOUNTER — Ambulatory Visit: Attending: Cardiology | Admitting: Cardiology

## 2024-08-02 ENCOUNTER — Encounter: Payer: Self-pay | Admitting: Cardiology

## 2024-08-02 VITALS — BP 118/68 | HR 73 | Ht 60.0 in | Wt 150.0 lb

## 2024-08-02 DIAGNOSIS — E782 Mixed hyperlipidemia: Secondary | ICD-10-CM | POA: Diagnosis not present

## 2024-08-02 DIAGNOSIS — I10 Essential (primary) hypertension: Secondary | ICD-10-CM | POA: Diagnosis not present

## 2024-08-02 DIAGNOSIS — R002 Palpitations: Secondary | ICD-10-CM

## 2024-08-02 NOTE — Patient Instructions (Signed)
 SURGICAL WAITING ROOM VISITATION  Patients having surgery or a procedure may have no more than 2 support people in the waiting area - these visitors may rotate.    Children ages 56 and under will not be able to visit patients in [facility] under most circumstances.   Visitors with respiratory illnesses are discouraged from visiting and should remain at home.  If the patient needs to stay at the hospital during part of their recovery, the visitor guidelines for inpatient rooms apply. Pre-op nurse will coordinate an appropriate time for 1 support person to accompany patient in pre-op.  This support person may not rotate.    Please refer to the [facility] website for the visitor guidelines for Inpatients (after your surgery is over and you are in a regular room).    Your procedure is scheduled on: 08/10/24   Report to [facility] Main Entrance    Report to admitting at 10:55 AM   Call this number if you have problems the morning of surgery [phone]   Do not eat food :After Midnight.   After Midnight you may have the following liquids until 10:25 AM DAY OF SURGERY  Water Non-Citrus Juices (without pulp, NO RED-Apple, White grape, White cranberry) Black Coffee (NO MILK/CREAM OR CREAMERS, sugar ok)  Clear Tea (NO MILK/CREAM OR CREAMERS, sugar ok) regular and decaf                             Plain Jell-O (NO RED)                                           Fruit ices (not with fruit pulp, NO RED)                                     Popsicles (NO RED)                                                               Sports drinks like Gatorade (NO RED)                 The day of surgery:  Drink ONE (1) Pre-Surgery G2 at 10:25 AM the morning of surgery. Drink in one sitting. Do not sip.  This drink was given to you during your hospital  pre-op appointment visit. Nothing else to drink after completing the  Pre-Surgery G2.          If you have questions, please  contact your surgeons office.   FOLLOW BOWEL PREP AND ANY ADDITIONAL PRE OP INSTRUCTIONS YOU RECEIVED FROM YOUR SURGEON'S OFFICE!!!     Oral Hygiene is also important to reduce your risk of infection.                                    Remember - BRUSH YOUR TEETH THE MORNING OF SURGERY WITH YOUR REGULAR TOOTHPASTE  DENTURES WILL BE REMOVED PRIOR TO SURGERY PLEASE DO NOT APPLY Poly grip  OR ADHESIVES!!!   Stop all vitamins and herbal supplements 7 days before surgery.   Take these medicines the morning of surgery with A SIP OF WATER: Metoprolol . Tylenol  if needed.  DO NOT TAKE ANY ORAL DIABETIC MEDICATIONS DAY OF YOUR SURGERY  How to Manage Your Diabetes Before and After Surgery  Why is it important to control my blood sugar before and after surgery? Improving blood sugar levels before and after surgery helps healing and can limit problems. A way of improving blood sugar control is eating a healthy diet by:  Eating less sugar and carbohydrates  Increasing activity/exercise  Talking with your doctor about reaching your blood sugar goals High blood sugars (greater than 180 mg/dL) can raise your risk of infections and slow your recovery, so you will need to focus on controlling your diabetes during the weeks before surgery. Make sure that the doctor who takes care of your diabetes knows about your planned surgery including the date and location.  How do I manage my blood sugar before surgery? Check your blood sugar at least 4 times a day, starting 2 days before surgery, to make sure that the level is not too high or low. Check your blood sugar the morning of your surgery when you wake up and every 2 hours until you get to the Short Stay unit. If your blood sugar is less than 70 mg/dL, you will need to treat for low blood sugar: Do not take insulin. Treat a low blood sugar (less than 70 mg/dL) with  cup of clear juice (cranberry or apple), 4 glucose tablets, OR glucose gel. Recheck  blood sugar in 15 minutes after treatment (to make sure it is greater than 70 mg/dL). If your blood sugar is not greater than 70 mg/dL on recheck, call [phone] for further instructions. Report your blood sugar to the short stay nurse when you get to Short Stay.  If you are admitted to the hospital after surgery: Your blood sugar will be checked by the staff and you will probably be given insulin after surgery (instead of oral diabetes medicines) to make sure you have good blood sugar levels. The goal for blood sugar control after surgery is 80-180 mg/dL.   WHAT DO I DO ABOUT MY DIABETES MEDICATION?  Do not take oral diabetes medicines (pills) the morning of surgery.  Do not take Ozempic  after 08/02/24.  DO NOT TAKE THE FOLLOWING 7 DAYS PRIOR TO SURGERY: Ozempic , Wegovy, Rybelsus (Semaglutide ), Byetta (exenatide), Bydureon (exenatide ER), Victoza, Saxenda (liraglutide), or Trulicity (dulaglutide) Mounjaro (Tirzepatide) Adlyxin (Lixisenatide), Polyethylene Glycol Loxenatide.   Reviewed and Endorsed by [facility] Patient Education Committee, August 2015  Bring CPAP mask and tubing day of surgery.                              You may not have any metal on your body including hair pins, jewelry, and body piercing             Do not wear make-up, lotions, powders, perfumes/cologne, or deodorant  Do not wear nail polish including gel and S&S, artificial/acrylic nails, or any other type of covering on natural nails including finger and toenails. If you have artificial nails, gel coating, etc. that needs to be removed by a nail salon please have this removed prior to surgery or surgery may need to be canceled/ delayed if the surgeon/ anesthesia feels like they are unable to be safely monitored.  Do not shave  48 hours prior to surgery.    Do not bring valuables to the hospital. [place] IS NOT             RESPONSIBLE   FOR VALUABLES.   Contacts, glasses, dentures or bridgework may not  be worn into surgery.   Bring small overnight bag day of surgery.   DO NOT BRING YOUR HOME MEDICATIONS TO THE HOSPITAL. PHARMACY WILL DISPENSE MEDICATIONS LISTED ON YOUR MEDICATION LIST TO YOU DURING YOUR ADMISSION IN THE HOSPITAL!              Please read over the following fact sheets you were given: IF YOU HAVE QUESTIONS ABOUT YOUR PRE-OP INSTRUCTIONS PLEASE CALL [phone].   If you received a COVID test during your pre-op visit  it is requested that you wear a mask when out in public, stay away from anyone that may not be feeling well and notify your surgeon if you develop symptoms. If you test positive for Covid or have been in contact with anyone that has tested positive in the last 10 days please notify you surgeon.      Pre-operative 4 CHG Bath Instructions  DYNA-Hex 4 Chlorhexidine  Gluconate 4% Solution Antiseptic 4 fl. oz   You can play a key role in reducing the risk of infection after surgery. Your skin needs to be as free of germs as possible. You can reduce the number of germs on your skin by washing with CHG (chlorhexidine  gluconate) soap before surgery. CHG is an antiseptic soap that kills germs and continues to kill germs even after washing.   DO NOT use if you have an allergy to chlorhexidine /CHG or antibacterial soaps. If your skin becomes reddened or irritated, stop using the CHG and notify one of our RNs at   Please shower with the CHG soap starting 4 days before surgery using the following schedule:     Please keep in mind the following:  DO NOT shave, including legs and underarms, starting the day of your first shower.   You may shave your face at any point before/day of surgery.  Place clean sheets on your bed the day you start using CHG soap. Use a clean washcloth (not used since being washed) for each shower. DO NOT sleep with pets once you start using the CHG.  CHG Shower Instructions:  If you choose to wash your hair and private area, wash first  with your normal shampoo/soap.  After you use shampoo/soap, rinse your hair and body thoroughly to remove shampoo/soap residue.  Turn the water OFF and apply about 3 tablespoons (45 ml) of CHG soap to a CLEAN washcloth.  Apply CHG soap ONLY FROM YOUR NECK DOWN TO YOUR TOES (washing for 3-5 minutes)  DO NOT use CHG soap on face, private areas, open wounds, or sores.  Pay special attention to the area where your surgery is being performed.  If you are having back surgery, having someone wash your back for you may be helpful. Wait 2 minutes after CHG soap is applied, then you may rinse off the CHG soap.  Pat dry with a clean towel  Put on clean clothes/pajamas   If you choose to wear lotion, please use ONLY the CHG-compatible lotions on the back of this paper.     Additional instructions for the day of surgery: DO NOT APPLY any lotions, deodorants, cologne, or perfumes.   Put on clean/comfortable clothes.  Brush your teeth.  Ask your  nurse before applying any prescription medications to the skin.   CHG Compatible Lotions   Aveeno Moisturizing lotion  Cetaphil Moisturizing Cream  Cetaphil Moisturizing Lotion  Clairol Herbal Essence Moisturizing Lotion, Dry Skin  Clairol Herbal Essence Moisturizing Lotion, Extra Dry Skin  Clairol Herbal Essence Moisturizing Lotion, Normal Skin  Curel Age Defying Therapeutic Moisturizing Lotion with Alpha Hydroxy  Curel Extreme Care Body Lotion  Curel Soothing Hands Moisturizing Hand Lotion  Curel Therapeutic Moisturizing Cream, Fragrance-Free  Curel Therapeutic Moisturizing Lotion, Fragrance-Free  Curel Therapeutic Moisturizing Lotion, Original Formula  Eucerin Daily Replenishing Lotion  Eucerin Dry Skin Therapy Plus Alpha Hydroxy Crme  Eucerin Dry Skin Therapy Plus Alpha Hydroxy Lotion  Eucerin Original Crme  Eucerin Original Lotion  Eucerin Plus Crme Eucerin Plus Lotion  Eucerin TriLipid Replenishing Lotion  Keri Anti-Bacterial Hand Lotion   Keri Deep Conditioning Original Lotion Dry Skin Formula Softly Scented  Keri Deep Conditioning Original Lotion, Fragrance Free Sensitive Skin Formula  Keri Lotion Fast Absorbing Fragrance Free Sensitive Skin Formula  Keri Lotion Fast Absorbing Softly Scented Dry Skin Formula  Keri Original Lotion  Keri Skin Renewal Lotion Keri Silky Smooth Lotion  Keri Silky Smooth Sensitive Skin Lotion  Nivea Body Creamy Conditioning Oil  Nivea Body Extra Enriched Teacher, Adult Education Moisturizing Lotion Nivea Crme  Nivea Skin Firming Lotion  NutraDerm 30 Skin Lotion  NutraDerm Skin Lotion  NutraDerm Therapeutic Skin Cream  NutraDerm Therapeutic Skin Lotion  ProShield Protective Hand Cream  Provon moisturizing lotion  View Pre-Surgery Education Videos:  [email or web address]    WHAT IS A BLOOD TRANSFUSION? Blood Transfusion Information  A transfusion is the replacement of blood or some of its parts. Blood is made up of multiple cells which provide different functions. Red blood cells carry oxygen and are used for blood loss replacement. White blood cells fight against infection. Platelets control bleeding. Plasma helps clot blood. Other blood products are available for specialized needs, such as hemophilia or other clotting disorders. BEFORE THE TRANSFUSION  Who gives blood for transfusions?  Healthy volunteers who are fully evaluated to make sure their blood is safe. This is blood bank blood. Transfusion therapy is the safest it has ever been in the practice of medicine. Before blood is taken from a donor, a complete history is taken to make sure that person has no history of diseases nor engages in risky social behavior (examples are intravenous drug use or sexual activity with multiple partners). The donor's travel history is screened to minimize risk of transmitting infections, such as malaria. The  donated blood is tested for signs of infectious diseases, such as HIV and hepatitis. The blood is then tested to be sure it is compatible with you in order to minimize the chance of a transfusion reaction. If you or a relative donates blood, this is often done in anticipation of surgery and is not appropriate for emergency situations. It takes many days to process the donated blood. RISKS AND COMPLICATIONS Although transfusion therapy is very safe and saves many lives, the main dangers of transfusion include:  Getting an infectious disease. Developing a transfusion reaction. This is an allergic reaction to something in the blood you were given. Every precaution is taken to prevent this. The decision to have a blood transfusion has been considered carefully by your caregiver before blood is given. Blood is not given unless the benefits outweigh the risks. AFTER THE TRANSFUSION Right after receiving a blood transfusion, you will usually feel  much better and more energetic. This is especially true if your red blood cells have gotten low (anemic). The transfusion raises the level of the red blood cells which carry oxygen, and this usually causes an energy increase. The nurse administering the transfusion will monitor you carefully for complications. HOME CARE INSTRUCTIONS  No special instructions are needed after a transfusion. You may find your energy is better. Speak with your caregiver about any limitations on activity for underlying diseases you may have. SEEK MEDICAL CARE IF:  Your condition is not improving after your transfusion. You develop redness or irritation at the intravenous (IV) site. SEEK IMMEDIATE MEDICAL CARE IF:  Any of the following symptoms occur over the next 12 hours: Shaking chills. You have a temperature by mouth above 102 F (38.9 C), not controlled by medicine. Chest, back, or muscle pain. People around you feel you are not acting correctly or are confused. Shortness of  breath or difficulty breathing. Dizziness and fainting. You get a rash or develop hives. You have a decrease in urine output. Your urine turns a dark color or changes to pink, red, or brown. Any of the following symptoms occur over the next 10 days: You have a temperature by mouth above 102 F (38.9 C), not controlled by medicine. Shortness of breath. Weakness after normal activity. The white part of the eye turns yellow (jaundice). You have a decrease in the amount of urine or are urinating less often. Your urine turns a dark color or changes to pink, red, or brown. Document Released: 07/04/2000 Document Revised: 09/29/2011 Document Reviewed: 02/21/2008 Exit[employer] Patient Information 2014 [place], [place].  _______________________________________________________________________  Incentive Spirometer  An incentive spirometer is a tool that can help keep your lungs clear and active. This tool measures how well you are filling your lungs with each breath. Taking long deep breaths may help reverse or decrease the chance of developing breathing (pulmonary) problems (especially infection) following: A long period of time when you are unable to move or be active. BEFORE THE PROCEDURE  If the spirometer includes an indicator to show your best effort, your nurse or respiratory therapist will set it to a desired goal. If possible, sit up straight or lean slightly forward. Try not to slouch. Hold the incentive spirometer in an upright position. INSTRUCTIONS FOR USE  Sit on the edge of your bed if possible, or sit up as far as you can in bed or on a chair. Hold the incentive spirometer in an upright position. Breathe out normally. Place the mouthpiece in your mouth and seal your lips tightly around it. Breathe in slowly and as deeply as possible, raising the piston or the ball toward the top of the column. Hold your breath for 3-5 seconds or for as long as possible. Allow the piston or ball to fall  to the bottom of the column. Remove the mouthpiece from your mouth and breathe out normally. Rest for a few seconds and repeat Steps 1 through 7 at least 10 times every 1-2 hours when you are awake. Take your time and take a few normal breaths between deep breaths. The spirometer may include an indicator to show your best effort. Use the indicator as a goal to work toward during each repetition. After each set of 10 deep breaths, practice coughing to be sure your lungs are clear. If you have an incision (the cut made at the time of surgery), support your incision when coughing by placing a pillow or rolled up towels firmly  against it. Once you are able to get out of bed, walk around indoors and cough well. You may stop using the incentive spirometer when instructed by your caregiver.  RISKS AND COMPLICATIONS Take your time so you do not get dizzy or light-headed. If you are in pain, you may need to take or ask for pain medication before doing incentive spirometry. It is harder to take a deep breath if you are having pain. AFTER USE Rest and breathe slowly and easily. It can be helpful to keep track of a log of your progress. Your caregiver can provide you with a simple table to help with this. If you are using the spirometer at home, follow these instructions: SEEK MEDICAL CARE IF:  You are having difficultly using the spirometer. You have trouble using the spirometer as often as instructed. Your pain medication is not giving enough relief while using the spirometer. You develop fever of 100.5 F (38.1 C) or higher. SEEK IMMEDIATE MEDICAL CARE IF:  You cough up bloody sputum that had not been present before. You develop fever of 102 F (38.9 C) or greater. You develop worsening pain at or near the incision site. MAKE SURE YOU:  Understand these instructions. Will watch your condition. Will get help right away if you are not doing well or get worse. Document Released: 11/17/2006 Document  Revised: 09/29/2011 Document Reviewed: 01/18/2007 [facility] Patient Information 2014 [place], [place].   ________________________________________________________________________

## 2024-08-02 NOTE — H&P (Signed)
 TOTAL HIP ADMISSION H&P  Patient is admitted for right total hip arthroplasty.  Subjective:  Chief Complaint: Right hip pain  HPI: Martha Pham, 74 y.o. female, has a history of pain and functional disability in the right hip due to arthritis and patient has failed non-surgical conservative treatments for greater than 12 weeks to include NSAID's and/or analgesics and activity modification. Onset of symptoms was gradual, starting several years ago with gradually worsening course since that time. The patient noted no past surgery on the right hip. Patient currently rates pain in the right hip at 8 out of 10 with activity. Patient has worsening of pain with activity and weight bearing, pain that interfers with activities of daily living, and pain with passive range of motion. Patient has evidence of severe end-stage arthritis in both hips by imaging studies. This condition presents safety issues increasing the risk of falls. There is no current active infection.  Patient Active Problem List   Diagnosis Date Noted   Pre-operative clearance 05/29/2024   OSA (obstructive sleep apnea) 05/29/2024   Polyarthralgia 04/04/2024   Anemia of chronic disease 03/31/2024   Tooth abscess 02/19/2024   Dry skin dermatitis 10/18/2023   Annual physical exam 10/08/2023   Left elbow tendinitis 08/01/2023   Counseling about travel 08/01/2023   Estrogen deficiency 05/28/2023   Essential hypertension 01/26/2023   Primary osteoarthritis of both hips 01/26/2023   RLS (restless legs syndrome) 01/26/2023   Pure hypercholesterolemia 04/06/2022   Palpitations 04/06/2022   Personal history of COVID-19 04/06/2022   Mixed hyperlipidemia 06/06/2021   Class 1 obesity due to excess calories with serious comorbidity and body mass index (BMI) of 31.0 to 31.9 in adult 06/06/2021   Keloid 06/06/2021   History of breast cancer 11/23/2020   Type 2 diabetes mellitus with stage 3a chronic kidney disease, without long-term  current use of insulin (HCC) 06/22/2018   Chronic renal disease, stage II 06/22/2018   Hypertensive nephropathy 06/22/2018   Overweight with body mass index (BMI) of 29 to 29.9 in adult 06/22/2018   Pain in joint, pelvic region and thigh 03/28/2014   Acquired absence of breast and absent nipple 05/05/2012    Past Medical History:  Diagnosis Date   Apnea, sleep    does wear cpap   Arthritis    Cancer (HCC)    lt. breast ca-snbx   Chronic kidney disease    Contact lens/glasses fitting    wears contacts or glasses   Heart murmur    Hypercholesteremia    Hyperlipemia    Hypertension    Lower back pain    Lung cancer (HCC)    Malaise and fatigue    Personal history of chemotherapy 2013   Sleep apnea    SLEEP STUDY DX 10,CPAP BUT DON'TUSE   Vitamin D  deficiency    Wears dentures    upper    Past Surgical History:  Procedure Laterality Date   BREAST BIOPSY Left 05/26/2011   malignant   BREAST BIOPSY Right 10/29/2016   BREAST IMPLANT EXCHANGE Left 09/23/2012   Procedure: REMOVAL OF LEFT EXPANDER/PLACEMENT OF IMPLANT WITH REDUCTION OF RIGHT BREAST;  Surgeon: Estefana Reichert, DO;  Location: [facility];  Service: Plastics;  Laterality: Left;   BREAST LUMPECTOMY W/ NEEDLE LOCALIZATION  06/24/2011   left   BREAST REDUCTION SURGERY Right 09/23/2012   Procedure: MAMMARY REDUCTION  (BREAST);  Surgeon: Estefana Reichert, DO;  Location: [facility];  Service: Plastics;  Laterality: Right;   BREAST  SURGERY  08/07/2011   re-exc. left breast margins   CATARACT EXTRACTION Bilateral    january and february 2022   CYST REMOVAL NECK  08/2013   Dr. Deborah   LIPOSUCTION Bilateral 09/23/2012   Procedure: LIPOSUCTION;  Surgeon: Estefana Deborah, DO;  Location: [facility];  Service: Plastics;  Laterality: Bilateral;   MASTECTOMY Left 2012   MOUTH SURGERY     TOOTH IMPLANT BOTTOM X2 LFT   PORT-A-CATH REMOVAL Right 09/23/2012   Procedure: REMOVAL  PORT-A-CATH;  Surgeon: Estefana Deborah, DO;  Location: [facility];  Service: Plastics;  Laterality: Right;   PORTACATH PLACEMENT  06/24/2011   Procedure: INSERTION PORT-A-CATH;  Surgeon: Redell Alm Faith, DO;  Location: [facility];  Service: General;  Laterality: Right;  Right mediport placement   RECONSTRUCTION BREAST IMMEDIATE / DELAYED W/ TISSUE EXPANDER  04/2012   left   REDUCTION MAMMAPLASTY Right 2013   SIMPLE MASTECTOMY  09/01/2011   left   TONSILLECTOMY     tooth implant removed     10/2021    Prior to Admission medications  Medication Sig Start Date End Date Taking? Authorizing Provider  acetaminophen  (TYLENOL ) 500 MG tablet Take 1,000 mg by mouth every 6 (six) hours as needed for moderate pain.    Yes [provider]  Ascorbic Acid (VITAMIN C PO) Take 1 tablet by mouth daily with lunch.   Yes [provider]  atorvastatin  (LIPITOR) 40 MG tablet Take 1 tablet by mouth once daily Patient taking differently: Take 40 mg by mouth at bedtime. 06/01/24  Yes Jarold Medici, MD  CALCIUM  PO Take 600 mg by mouth daily with lunch.   Yes [provider]  Cyanocobalamin (VITAMIN B-12 SL) Take 1 tablet by mouth daily with lunch.   Yes [provider]  diclofenac Sodium (VOLTAREN ARTHRITIS PAIN) 1 % GEL Apply 1 Application topically 4 (four) times daily as needed (pain.).   Yes [provider]  gabapentin  (NEURONTIN ) 100 MG capsule Take 1 pill at bedtime as needed for leg pain as needed 05/28/23  Yes Jarold Medici, MD  hydrochlorothiazide  (HYDRODIURIL ) 25 MG tablet Take 1 tablet (25 mg total) by mouth daily. 07/29/23  Yes Jarold Medici, MD  loratadine  (CLARITIN ) 10 MG tablet Take 1 tablet (10 mg total) by mouth daily. prn Patient taking differently: Take 10 mg by mouth daily as needed for allergies. 05/28/23  Yes Jarold Medici, MD  MAGNESIUM PO Take 1 capsule by mouth daily with lunch.   Yes [provider]   Menthol-Methyl Salicylate (SALONPAS PAIN RELIEF PATCH EX) Place 1 patch onto the skin daily as needed (pain.).   Yes [provider]  metoprolol  succinate (TOPROL -XL) 25 MG 24 hr tablet Take 1 tablet by mouth once daily 06/01/24  Yes Jarold Medici, MD  Polyethyl Glycol-Propyl Glycol (SYSTANE) 0.4-0.3 % SOLN Place 1-2 drops into both eyes in the morning and at bedtime.   Yes [provider]  Semaglutide ,0.25 or 0.5MG /DOS, (OZEMPIC , 0.25 OR 0.5 MG/DOSE,) 2 MG/3ML SOPN Inject 0.5 mg into the skin once a week. Patient taking differently: Inject 0.5 mg into the skin every Tuesday. 12/31/21  Yes Jarold Medici, MD  VITAMIN D , CHOLECALCIFEROL, PO Take 1 capsule by mouth daily with lunch.   Yes [provider]  Blood Glucose Monitoring Suppl (ONETOUCH VERIO) w/Device KIT USE AS DIRECTED TO CHECK BLOOD SUGARS 09/09/22   Jarold Medici, MD  glucose blood (CONTOUR NEXT TEST) test strip USE AS DIRECTED TO  CHECK  BLOOD  SUGARS  ONCE  A  DAY 09/08/22   Jarold Medici, MD  glucose blood [facility] VERIO) test strip Use as instructed 09/09/22   Jarold Medici, MD  losartan  (COZAAR ) 100 MG tablet Take 1 tablet by mouth once daily 08/01/24   Jarold Medici, MD  Microlet Lancets MISC USE AS DIRECTED TO  CHECK  BLOOD  SUGARS  ONE  TIME  A  DAY 09/08/22   Jarold Medici, MD  OneTouch Delica Lancets 30G MISC USE ONCE DAILY TO CHECK BLOOD SUGAR. 05/28/23   Jarold Medici, MD    Allergies[1]  Social History   Socioeconomic History   Marital status: Widowed    Spouse name: Not on file   Number of children: 1   Years of education: 14   Highest education level: Not on file  Occupational History   Occupation: Retired  Tobacco Use   Smoking status: Never    Passive exposure: Past   Smokeless tobacco: Never  Vaping Use   Vaping status: Never Used  Substance and Sexual Activity   Alcohol use: Not Currently   Drug use: No   Sexual activity: Not Currently    Partners: Male    Birth  control/protection: Post-menopausal  Other Topics Concern   Not on file  Social History Narrative   Daughter [place], [place]   Social Drivers of Health   Tobacco Use: Low Risk (07/26/2024)   Patient History    Smoking Tobacco Use: Never    Smokeless Tobacco Use: Never    Passive Exposure: Past  Financial Resource Strain: Low Risk (07/03/2023)   Overall Financial Resource Strain (CARDIA)    Difficulty of Paying Living Expenses: Not hard at all  Food Insecurity: No Food Insecurity (07/03/2023)   Hunger Vital Sign    Worried About Running Out of Food in the Last Year: Never true    Ran Out of Food in the Last Year: Never true  Transportation Needs: No Transportation Needs (07/03/2023)   PRAPARE - Administrator, Civil Service (Medical): No    Lack of Transportation (Non-Medical): No  Physical Activity: Insufficiently Active (07/03/2023)   Exercise Vital Sign    Days of Exercise per Week: 3 days    Minutes of Exercise per Session: 30 min  Stress: No Stress Concern Present (07/03/2023)   [employer] of Occupational Health - Occupational Stress Questionnaire    Feeling of Stress : Not at all  Social Connections: Moderately Isolated (07/03/2023)   Social Connection and Isolation Panel    Frequency of Communication with Friends and Family: Once a week    Frequency of Social Gatherings with Friends and Family: Once a week    Attends Religious Services: More than 4 times per year    Active Member of [employer] or Organizations: Yes    Attends Banker Meetings: 1 to 4 times per year    Marital Status: Widowed  Intimate Partner Violence: Not At Risk (07/03/2023)   Humiliation, Afraid, Rape, and Kick questionnaire    Fear of Current or Ex-Partner: No    Emotionally Abused: No    Physically Abused: No    Sexually Abused: No  Depression (PHQ2-9): Low Risk (05/23/2024)   Depression (PHQ2-9)    PHQ-2 Score: 0  Alcohol Screen: Low Risk (03/31/2023)   Alcohol Screen     Last Alcohol Screening Score (AUDIT): 2  Housing: Unknown (07/03/2023)   Housing Stability Vital Sign    Unable to Pay for Housing in  the Last Year: No    Number of Times Moved in the Last Year: Not on file    Homeless in the Last Year: No  Utilities: Not At Risk (07/03/2023)   AHC Utilities    Threatened with loss of utilities: No  Health Literacy: Adequate Health Literacy (07/03/2023)   B1300 Health Literacy    Frequency of need for help with medical instructions: Never    Tobacco Use: Low Risk (07/26/2024)   Patient History    Smoking Tobacco Use: Never    Smokeless Tobacco Use: Never    Passive Exposure: Past   Social History   Substance and Sexual Activity  Alcohol Use Not Currently    Family History  Problem Relation Age of Onset   Heart attack Mother    Brain cancer Father    Breast cancer Sister 20   Osteoarthritis Sister    Diabetes Sister    Breast cancer Sister 53   Heart attack Sister    Hypertension Sister    Hyperlipidemia Sister    Hypertension Sister    Osteoarthritis Sister    Hyperlipidemia Sister    Heart disease Paternal Grandfather    Aneurysm Son    Sleep apnea Neg Hx     ROS   Objective:  Physical Exam: - Well-developed female alert and oriented in no apparent distress   - Left hip: Flexion approximately 100, internal rotation approximately 10, external rotation approximately 10, abduction approximately 20.    - Right hip: Flexion approximately 90, no internal or external rotation, no abduction.    - Gait: Significant alteration on the right side with the use of a cane.    IMAGING:  Radiographs from the last visit, including AP pelvis and lateral views of the right hip, demonstrate severe end-stage arthritis in both hips. Findings include bone-on-bone contact throughout, marginal osteophytes, and subchondral cysts. Both hips are equally affected.  Assessment/Plan:  End stage arthritis, right hip  The patient history,  physical examination, clinical judgement of the provider and imaging studies are consistent with end stage degenerative joint disease of the right hip and total hip arthroplasty is deemed medically necessary. The treatment options including medical management, injection therapy, arthroscopy and arthroplasty were discussed at length. The risks and benefits of total hip arthroplasty were presented and reviewed. The risks due to aseptic loosening, infection, stiffness, dislocation/subluxation, thromboembolic complications and other imponderables were discussed. The patient acknowledged the explanation, agreed to proceed with the plan and consent was signed. Patient is being admitted for inpatient treatment for surgery, pain control, PT, OT, prophylactic antibiotics, VTE prophylaxis, progressive ambulation and ADLs and discharge planning.The patient is planning to be discharged home.   Patient's anticipated LOS is less than 2 midnights, meeting these requirements: - Younger than 35 - Lives within 1 hour of care - Has a competent adult at home to recover with post-op recover - NO history of  - Chronic pain requiring opiods  - Diabetes  - Coronary Artery Disease  - Heart failure  - Heart attack  - Stroke  - DVT/VTE  - Cardiac arrhythmia  - Respiratory Failure/COPD  - Anemia  - Advanced Liver disease  Therapy Plans: HEP Disposition: Home with daughter for a few nights. Planned DVT Prophylaxis: Xarelto 10mg  QD DME Needed: Vannie ordered PCP: Catheryn Slocumb, MD (clearance received) TXA: IV Allergies: NDKA Anesthesia Concerns: None BMI: 29.8 Last HgbA1c: 5.5% on 05/23/24 Pain Regimen: Hydrocodone , tramadol Pharmacy: [facility]  Other:  -Completed 10 days of amoxicillin   to treat possible dental infection. Dentist informed patient no sign of active infection, she is scheduled for root canal in April due to damaged nerve. Discussed with Dr. Melodi.  - Patient was instructed on what  medications to stop prior to surgery. - Follow-up visit in 2 weeks with Dr. Melodi - Begin physical therapy following surgery - Pre-operative lab work as pre-surgical testing - Prescriptions will be provided in hospital at time of discharge  Corean Sender, PA-C Orthopedic Surgery [facility] [place]      [1]  Allergies Allergen Reactions   Dust Mite Extract Itching   Other     grass

## 2024-08-02 NOTE — Patient Instructions (Signed)
Medication Instructions:   Your physician recommends that you continue on your current medications as directed. Please refer to the Current Medication list given to you today.  *If you need a refill on your cardiac medications before your next appointment, please call your pharmacy*   Follow-Up:  As needed

## 2024-08-02 NOTE — Progress Notes (Signed)
 Date of COVID positive in last 90 days:  PCP - Catheryn Slocumb, MD Cardiologist - Thom Sluder, MD LOV 08/03/24  Medical clearance by Grayce Slocumb, MD 05/23/24 Epic  Chest x-ray - N/A EKG - 05/23/24 Epic Stress Test - more than 2 years ago per pt ECHO - 05/30/22 Epic Cardiac Cath - N/A Pacemaker/ICD device last checked:N/A Spinal Cord Stimulator:N/A  Bowel Prep - N/A  Sleep Study - yes CPAP - has not used in past year, per pt preference   Fasting Blood Sugar -  Checks Blood Sugar checks monthly  Last dose of GLP1 agonist-  Ozempic , takes on Tuesdays GLP1 instructions:  Do not take after  08/02/24   Last dose of SGLT-2 inhibitors-  N/A SGLT-2 instructions:  Do not take after     Blood Thinner Instructions: N/A Last dose:   Time: Aspirin Instructions:N/A Last Dose:  Activity level: Can go up a flight of stairs and perform activities of daily living without stopping and without symptoms of chest pain or shortness of breath.   Anesthesia review: palpitations, HTN, OSA, DM2, heart murmur   Patient denies shortness of breath, fever, cough and chest pain at PAT appointment  Patient verbalized understanding of instructions that were given to them at the PAT appointment. Patient was also instructed that they will need to review over the PAT instructions again at home before surgery.

## 2024-08-03 ENCOUNTER — Encounter (HOSPITAL_COMMUNITY)
Admission: RE | Admit: 2024-08-03 | Discharge: 2024-08-03 | Disposition: A | Discharge status: Home / Self Care | Source: Ambulatory Visit | Attending: Orthopedic Surgery | Admitting: Orthopedic Surgery

## 2024-08-03 ENCOUNTER — Encounter (HOSPITAL_COMMUNITY): Payer: Self-pay

## 2024-08-03 ENCOUNTER — Other Ambulatory Visit: Payer: Self-pay

## 2024-08-03 VITALS — BP 138/69 | HR 63 | Resp 14 | Ht 60.0 in | Wt 150.0 lb

## 2024-08-03 DIAGNOSIS — Z9221 Personal history of antineoplastic chemotherapy: Secondary | ICD-10-CM | POA: Diagnosis not present

## 2024-08-03 DIAGNOSIS — I1310 Hypertensive heart and chronic kidney disease without heart failure, with stage 1 through stage 4 chronic kidney disease, or unspecified chronic kidney disease: Secondary | ICD-10-CM | POA: Diagnosis not present

## 2024-08-03 DIAGNOSIS — I252 Old myocardial infarction: Secondary | ICD-10-CM | POA: Insufficient documentation

## 2024-08-03 DIAGNOSIS — E1122 Type 2 diabetes mellitus with diabetic chronic kidney disease: Secondary | ICD-10-CM | POA: Insufficient documentation

## 2024-08-03 DIAGNOSIS — N1831 Chronic kidney disease, stage 3a: Secondary | ICD-10-CM

## 2024-08-03 DIAGNOSIS — G4733 Obstructive sleep apnea (adult) (pediatric): Secondary | ICD-10-CM | POA: Insufficient documentation

## 2024-08-03 DIAGNOSIS — I441 Atrioventricular block, second degree: Secondary | ICD-10-CM | POA: Insufficient documentation

## 2024-08-03 DIAGNOSIS — I130 Hypertensive heart and chronic kidney disease with heart failure and stage 1 through stage 4 chronic kidney disease, or unspecified chronic kidney disease: Secondary | ICD-10-CM | POA: Diagnosis not present

## 2024-08-03 DIAGNOSIS — N183 Chronic kidney disease, stage 3 unspecified: Secondary | ICD-10-CM | POA: Diagnosis not present

## 2024-08-03 DIAGNOSIS — Z01818 Encounter for other preprocedural examination: Secondary | ICD-10-CM | POA: Diagnosis present

## 2024-08-03 DIAGNOSIS — Z85118 Personal history of other malignant neoplasm of bronchus and lung: Secondary | ICD-10-CM | POA: Insufficient documentation

## 2024-08-03 DIAGNOSIS — Z853 Personal history of malignant neoplasm of breast: Secondary | ICD-10-CM | POA: Insufficient documentation

## 2024-08-03 DIAGNOSIS — M1611 Unilateral primary osteoarthritis, right hip: Secondary | ICD-10-CM | POA: Diagnosis not present

## 2024-08-03 HISTORY — DX: Type 2 diabetes mellitus without complications: E11.9

## 2024-08-03 LAB — CBC
HCT: 34 % — ABNORMAL LOW (ref 36.0–46.0)
Hemoglobin: 11 g/dL — ABNORMAL LOW (ref 12.0–15.0)
MCH: 32 pg (ref 26.0–34.0)
MCHC: 32.4 g/dL (ref 30.0–36.0)
MCV: 98.8 fL (ref 80.0–100.0)
Platelets: 217 K/uL (ref 150–400)
RBC: 3.44 MIL/uL — ABNORMAL LOW (ref 3.87–5.11)
RDW: 12.9 % (ref 11.5–15.5)
WBC: 6.7 K/uL (ref 4.0–10.5)
nRBC: 0 % (ref 0.0–0.2)

## 2024-08-03 LAB — SURGICAL PCR SCREEN
MRSA, PCR: NEGATIVE
Staphylococcus aureus: NEGATIVE

## 2024-08-03 LAB — BASIC METABOLIC PANEL WITH GFR
Anion gap: 13 (ref 5–15)
BUN: 46 mg/dL — ABNORMAL HIGH (ref 8–23)
CO2: 22 mmol/L (ref 22–32)
Calcium: 10.2 mg/dL (ref 8.9–10.3)
Chloride: 103 mmol/L (ref 98–111)
Creatinine, Ser: 1.35 mg/dL — ABNORMAL HIGH (ref 0.44–1.00)
GFR, Estimated: 41 mL/min — ABNORMAL LOW
Glucose, Bld: 112 mg/dL — ABNORMAL HIGH (ref 70–99)
Potassium: 3.4 mmol/L — ABNORMAL LOW (ref 3.5–5.1)
Sodium: 137 mmol/L (ref 135–145)

## 2024-08-03 LAB — HEMOGLOBIN A1C
Hgb A1c MFr Bld: 5.7 % — ABNORMAL HIGH (ref 4.8–5.6)
Mean Plasma Glucose: 116.89 mg/dL

## 2024-08-03 LAB — GLUCOSE, CAPILLARY: Glucose-Capillary: 117 mg/dL — ABNORMAL HIGH (ref 70–99)

## 2024-08-04 NOTE — Anesthesia Preprocedure Evaluation (Signed)
 Anesthesia Evaluation  Patient identified by MRN, date of birth, ID band Patient awake    Reviewed: Allergy & Precautions, NPO status , Patient's Chart, lab work & pertinent test results  History of Anesthesia Complications Negative for: history of anesthetic complications  Airway Mallampati: II  TM Distance: >3 FB Neck ROM: Full    Dental  (+) Edentulous Upper, Edentulous Lower   Pulmonary sleep apnea , neg COPD, Patient abstained from smoking.Not current smoker   Pulmonary exam normal breath sounds clear to auscultation       Cardiovascular Exercise Tolerance: Good METShypertension, +CHF  (-) CAD and (-) Past MI (-) dysrhythmias  Rhythm:Regular Rate:Normal - Systolic murmurs    Neuro/Psych  Headaches, Seizures -,   negative psych ROS   GI/Hepatic ,neg GERD  ,,(+)     (-) substance abuse    Endo/Other  diabetes, Type 2, Insulin  Dependent    Renal/GU ESRF and DialysisRenal diseaseLast dialyzed yesterday     Musculoskeletal   Abdominal   Peds  Hematology  (+) Blood dyscrasia, anemia   Anesthesia Other Findings Per PAT note: Martha Pham is a 74 yo female with PMH of hypertension, HFpEF, ESRD on dialysis MWF, OSA (no CPAP), IDDM, chronic headaches, anemia, left renal mass   Patient has a chronic left renal mass (oncocytoma) which has progressively enlarged and now causes chronic left flank pain.  Scheduled for surgery above   Patient admitted from 9/11 to 9/14 for acute respiratory failure due to volume overload.  Echo was repeated while inpatient and showed normal LVEF with grade 2 diastolic dysfunction.  Treated with dialysis and Lasix  and improved.  Patient readmitted from 10/9 to 10/11 for similar issue.  Treated again with Lasix  and dialysis.   At PAT visit patient reports she can go up a flight of stairs and activities of daily living without stopping and without chest pain and/or shortness of breath    Reproductive/Obstetrics                              Anesthesia Physical Anesthesia Plan  ASA: 4  Anesthesia Plan: General   Post-op Pain Management: Ofirmev  IV (intra-op)*   Induction: Intravenous  PONV Risk Score and Plan: 4 or greater and Ondansetron , Dexamethasone  and Treatment may vary due to age or medical condition  Airway Management Planned: Oral ETT  Additional Equipment: ClearSight  Intra-op Plan:   Post-operative Plan: Extubation in OR  Informed Consent: I have reviewed the patients History and Physical, chart, labs and discussed the procedure including the risks, benefits and alternatives for the proposed anesthesia with the patient or authorized representative who has indicated his/her understanding and acceptance.     Dental advisory given  Plan Discussed with: CRNA and Surgeon  Anesthesia Plan Comments: (Discussed risks of anesthesia with patient, including PONV, sore throat, lip/dental/eye damage, post operative cognitive dysfunction. Rare risks discussed as well, such as cardiorespiratory and neurological sequelae, and allergic reactions. Discussed the role of CRNA in patient's perioperative care. Patient understands. Patient counseled on being higher risk for anesthesia due to comorbidities: HFpEF, ESRD on dialysis. Patient was told about increased risk of cardiac and respiratory events, including death. )         Anesthesia Quick Evaluation

## 2024-08-04 NOTE — Progress Notes (Signed)
 " Case: [id number] Date/Time: 08/10/24 1310   Procedure: ARTHROPLASTY, HIP, TOTAL, ANTERIOR APPROACH (Right: Hip)   Anesthesia type: Choice   Pre-op diagnosis: Right hip osteoarthritis   Location: [facility] ROOM 10 / [facility]   Surgeons: Melodi Lerner, MD       DISCUSSION: Martha Pham is a 74 yo female with PMH of HTN, heart murmur, OSA (CPAP intolerance), DM, CKD3, breast cancer s/p left mastectomy and chemo (2013), anemia, arthritis.  Patient seen by cardiology 08/02/24 for palpitations. Per PA Haley:    Palpitations -05/2022 normal heart monitor/echocardiogram Rare palpitations often in the setting of stressful events.  Overall benign heart monitor and echocardiogram previously.  She is fine with current therapy and did not want any changes.  Reviewed EKG from 05/2022.  No concerning features. Continue Toprol -XL 25 mg daily    VS: BP 138/69   Pulse 63   Resp 14   Ht 5' (1.524 m)   Wt 68 kg   SpO2 100%   BMI 29.30 kg/m   PROVIDERS: Jarold Medici, MD   LABS: Labs reviewed: Acceptable for surgery. Anemia stable. Kidney function mildly depressed. (all labs ordered are listed, but only abnormal results are displayed)  Labs Reviewed  HEMOGLOBIN A1C - Abnormal; Notable for the following components:      Result Value   Hgb A1c MFr Bld 5.7 (*)    All other components within normal limits  BASIC METABOLIC PANEL WITH GFR - Abnormal; Notable for the following components:   Potassium 3.4 (*)    Glucose, Bld 112 (*)    BUN 46 (*)    Creatinine, Ser 1.35 (*)    GFR, Estimated 41 (*)    All other components within normal limits  CBC - Abnormal; Notable for the following components:   RBC 3.44 (*)    Hemoglobin 11.0 (*)    HCT 34.0 (*)    All other components within normal limits  GLUCOSE, CAPILLARY - Abnormal; Notable for the following components:   Glucose-Capillary 117 (*)    All other components within normal limits  SURGICAL PCR SCREEN  TYPE AND SCREEN      EKG  05/23/24:  Impression `HR: 68 Sinus Rhythm -consider old anterior infarct.  Echocardiogram 05/30/2022:  Evaluation limited due to absent apical views. Left ventricle cavity is normal in size. Mild concentric hypertrophy of the left ventricle. Normal global wall motion. Normal LVEF 55-60%. Diastolic function could not be assessed. Mild pulmonic regurgitation. Normal right atrial pressure. Unlike previous study in 2016, MR, TR not appreciated.  Mobile cardiac telemetry 14 days 05/12/2022 - 05/26/2022: Dominant rhythm: Sinus. HR 44-126 bpm. Avg HR 67 bpm, in sinus rhythm. 1 episode of probable atrial tachycardia, at 135 bpm for 5 beats. <1% isolated SVE, couplet/triplets. 0 episode of VT. <1% isolated VE, couplets. Second degree type 1 AV block noted. No atrial fibrillation/atrial flutter/VT/high grade AV block, sinus pause >3sec noted. 0 patient triggered events.    Past Medical History:  Diagnosis Date   Apnea, sleep    does wear cpap   Arthritis    Cancer (HCC)    lt. breast ca-snbx   Chronic kidney disease    Contact lens/glasses fitting    wears contacts or glasses   Diabetes mellitus without complication (HCC)    Heart murmur    Hypercholesteremia    Hyperlipemia    Hypertension    Lower back pain    Lung cancer (HCC)    family hx of lung cancer  Malaise and fatigue    Personal history of chemotherapy 2013   Sleep apnea    SLEEP STUDY DX 10,CPAP BUT DON'TUSE   Vitamin D  deficiency    Wears dentures    upper    Past Surgical History:  Procedure Laterality Date   BREAST BIOPSY Left 05/26/2011   malignant   BREAST BIOPSY Right 10/29/2016   BREAST IMPLANT EXCHANGE Left 09/23/2012   Procedure: REMOVAL OF LEFT EXPANDER/PLACEMENT OF IMPLANT WITH REDUCTION OF RIGHT BREAST;  Surgeon: Estefana Reichert, DO;  Location: [facility];  Service: Plastics;  Laterality: Left;   BREAST LUMPECTOMY W/ NEEDLE LOCALIZATION  06/24/2011   left   BREAST REDUCTION  SURGERY Right 09/23/2012   Procedure: MAMMARY REDUCTION  (BREAST);  Surgeon: Estefana Reichert, DO;  Location: [facility];  Service: Plastics;  Laterality: Right;   BREAST SURGERY  08/07/2011   re-exc. left breast margins   CATARACT EXTRACTION Bilateral    january and february 2022   CYST REMOVAL NECK  08/2013   Dr. Reichert   LIPOSUCTION Bilateral 09/23/2012   Procedure: LIPOSUCTION;  Surgeon: Estefana Reichert, DO;  Location: [facility];  Service: Plastics;  Laterality: Bilateral;   MASTECTOMY Left 2012   MOUTH SURGERY     TOOTH IMPLANT BOTTOM X2 LFT   PORT-A-CATH REMOVAL Right 09/23/2012   Procedure: REMOVAL PORT-A-CATH;  Surgeon: Estefana Reichert, DO;  Location: [facility];  Service: Plastics;  Laterality: Right;   PORTACATH PLACEMENT  06/24/2011   Procedure: INSERTION PORT-A-CATH;  Surgeon: Redell Alm Faith, DO;  Location: [facility];  Service: General;  Laterality: Right;  Right mediport placement   RECONSTRUCTION BREAST IMMEDIATE / DELAYED W/ TISSUE EXPANDER  04/2012   left   REDUCTION MAMMAPLASTY Right 2013   SIMPLE MASTECTOMY  09/01/2011   left   TONSILLECTOMY     tooth implant removed     10/2021    MEDICATIONS:  acetaminophen  (TYLENOL ) 500 MG tablet   Ascorbic Acid (VITAMIN C PO)   atorvastatin  (LIPITOR) 40 MG tablet   Blood Glucose Monitoring Suppl (ONETOUCH VERIO) w/Device KIT   CALCIUM  PO   Cyanocobalamin (VITAMIN B-12 SL)   diclofenac Sodium (VOLTAREN ARTHRITIS PAIN) 1 % GEL   gabapentin  (NEURONTIN ) 100 MG capsule   glucose blood (CONTOUR NEXT TEST) test strip   glucose blood (ONETOUCH VERIO) test strip   hydrochlorothiazide  (HYDRODIURIL ) 25 MG tablet   loratadine  (CLARITIN ) 10 MG tablet   losartan  (COZAAR ) 100 MG tablet   MAGNESIUM PO   Menthol-Methyl Salicylate (SALONPAS PAIN RELIEF PATCH EX)   metoprolol  succinate (TOPROL -XL) 25 MG 24 hr tablet   Microlet Lancets MISC   OneTouch Delica Lancets 30G MISC    Polyethyl Glycol-Propyl Glycol (SYSTANE) 0.4-0.3 % SOLN   Semaglutide ,0.25 or 0.5MG /DOS, (OZEMPIC , 0.25 OR 0.5 MG/DOSE,) 2 MG/3ML SOPN   VITAMIN D , CHOLECALCIFEROL, PO   No current facility-administered medications for this encounter.   Burnard CHRISTELLA Odis DEVONNA [facility] Short Stay/Anesthesiology [facility] Phone [phone] 08/04/2024 10:36 AM        "

## 2024-08-10 LAB — TYPE AND SCREEN
ABO/RH(D): A POS
Antibody Screen: NEGATIVE

## 2024-10-13 ENCOUNTER — Encounter: Payer: Self-pay | Admitting: Internal Medicine

## 2024-10-19 ENCOUNTER — Ambulatory Visit (HOSPITAL_COMMUNITY): Admission: RE | Admit: 2024-10-19 | Source: Ambulatory Visit | Admitting: Orthopedic Surgery

## 2024-10-19 ENCOUNTER — Encounter (HOSPITAL_COMMUNITY): Payer: Self-pay | Admitting: Medical

## 2024-10-19 ENCOUNTER — Encounter (HOSPITAL_COMMUNITY): Admission: RE | Payer: Self-pay | Source: Ambulatory Visit

## 2024-10-19 SURGERY — ARTHROPLASTY, HIP, TOTAL, ANTERIOR APPROACH
Anesthesia: Choice | Site: Hip | Laterality: Right

## 2024-12-05 ENCOUNTER — Ambulatory Visit: Admitting: Hematology and Oncology
